# Patient Record
Sex: Female | Born: 1955 | Hispanic: Yes | Marital: Married | Smoking: Never smoker
Health system: Southern US, Community
[De-identification: ages and names within clinical notes are randomized; demographics above are authoritative.]

## PROBLEM LIST (undated history)

## (undated) DIAGNOSIS — Z9221 Personal history of antineoplastic chemotherapy: Secondary | ICD-10-CM

## (undated) DIAGNOSIS — Z9889 Other specified postprocedural states: Secondary | ICD-10-CM

## (undated) DIAGNOSIS — E039 Hypothyroidism, unspecified: Secondary | ICD-10-CM

## (undated) DIAGNOSIS — R7303 Prediabetes: Secondary | ICD-10-CM

## (undated) DIAGNOSIS — R112 Nausea with vomiting, unspecified: Secondary | ICD-10-CM

## (undated) DIAGNOSIS — Z1509 Genetic susceptibility to other malignant neoplasm: Secondary | ICD-10-CM

## (undated) DIAGNOSIS — N390 Urinary tract infection, site not specified: Secondary | ICD-10-CM

## (undated) DIAGNOSIS — Z923 Personal history of irradiation: Secondary | ICD-10-CM

## (undated) DIAGNOSIS — E079 Disorder of thyroid, unspecified: Secondary | ICD-10-CM

## (undated) DIAGNOSIS — C801 Malignant (primary) neoplasm, unspecified: Secondary | ICD-10-CM

## (undated) DIAGNOSIS — C50919 Malignant neoplasm of unspecified site of unspecified female breast: Secondary | ICD-10-CM

## (undated) DIAGNOSIS — Z1501 Genetic susceptibility to malignant neoplasm of breast: Secondary | ICD-10-CM

## (undated) DIAGNOSIS — I10 Essential (primary) hypertension: Secondary | ICD-10-CM

## (undated) DIAGNOSIS — C50411 Malignant neoplasm of upper-outer quadrant of right female breast: Secondary | ICD-10-CM

## (undated) DIAGNOSIS — F419 Anxiety disorder, unspecified: Secondary | ICD-10-CM

## (undated) HISTORY — DX: Urinary tract infection, site not specified: N39.0

## (undated) HISTORY — PX: ABDOMINAL HYSTERECTOMY: SHX81

## (undated) HISTORY — DX: Essential (primary) hypertension: I10

## (undated) HISTORY — PX: BREAST SURGERY: SHX581

## (undated) HISTORY — PX: SHOULDER ARTHROSCOPY: SHX128

## (undated) HISTORY — DX: Disorder of thyroid, unspecified: E07.9

## (undated) HISTORY — DX: Genetic susceptibility to malignant neoplasm of breast: Z15.09

## (undated) HISTORY — PX: EYE SURGERY: SHX253

## (undated) HISTORY — DX: Genetic susceptibility to malignant neoplasm of breast: Z15.01

## (undated) HISTORY — PX: CERVIX SURGERY: SHX593

---

## 1988-03-17 DIAGNOSIS — C539 Malignant neoplasm of cervix uteri, unspecified: Secondary | ICD-10-CM

## 1988-03-17 HISTORY — DX: Malignant neoplasm of cervix uteri, unspecified: C53.9

## 2003-12-26 ENCOUNTER — Ambulatory Visit: Payer: Self-pay

## 2004-06-26 ENCOUNTER — Ambulatory Visit: Payer: Self-pay

## 2005-12-24 ENCOUNTER — Ambulatory Visit: Payer: Self-pay

## 2006-03-17 HISTORY — PX: COLONOSCOPY: SHX174

## 2006-08-12 ENCOUNTER — Ambulatory Visit: Payer: Self-pay

## 2006-11-11 ENCOUNTER — Ambulatory Visit: Payer: Self-pay

## 2006-12-28 ENCOUNTER — Ambulatory Visit: Payer: Self-pay

## 2008-01-12 ENCOUNTER — Ambulatory Visit: Payer: Self-pay

## 2009-09-11 ENCOUNTER — Ambulatory Visit: Payer: Self-pay

## 2010-03-17 HISTORY — PX: SHOULDER ARTHROSCOPY: SHX128

## 2010-03-27 ENCOUNTER — Encounter
Admission: RE | Admit: 2010-03-27 | Discharge: 2010-03-27 | Payer: Self-pay | Source: Home / Self Care | Attending: Neurosurgery | Admitting: Neurosurgery

## 2010-06-07 ENCOUNTER — Ambulatory Visit: Payer: Self-pay | Admitting: Specialist

## 2010-06-24 ENCOUNTER — Other Ambulatory Visit: Payer: Self-pay | Admitting: Internal Medicine

## 2010-06-26 ENCOUNTER — Ambulatory Visit: Payer: Self-pay | Admitting: Internal Medicine

## 2010-07-03 ENCOUNTER — Ambulatory Visit: Payer: Self-pay | Admitting: Specialist

## 2010-10-22 ENCOUNTER — Ambulatory Visit: Payer: Self-pay

## 2011-01-22 ENCOUNTER — Emergency Department: Payer: Self-pay

## 2011-10-23 ENCOUNTER — Ambulatory Visit: Payer: Self-pay | Admitting: Family Medicine

## 2012-09-30 ENCOUNTER — Ambulatory Visit: Payer: Self-pay | Admitting: Family Medicine

## 2014-05-22 ENCOUNTER — Ambulatory Visit: Payer: Self-pay

## 2014-11-15 ENCOUNTER — Ambulatory Visit: Payer: Self-pay

## 2015-06-20 ENCOUNTER — Ambulatory Visit: Payer: Self-pay | Attending: Oncology | Admitting: *Deleted

## 2015-06-20 ENCOUNTER — Encounter: Payer: Self-pay | Admitting: *Deleted

## 2015-06-20 ENCOUNTER — Ambulatory Visit
Admission: RE | Admit: 2015-06-20 | Discharge: 2015-06-20 | Disposition: A | Discharge status: Home / Self Care | Payer: Self-pay | Source: Ambulatory Visit | Attending: Oncology | Admitting: Oncology

## 2015-06-20 VITALS — BP 147/77 | HR 73 | Temp 99.0°F | Resp 20 | Ht 60.24 in | Wt 161.5 lb

## 2015-06-20 DIAGNOSIS — Z0000 Encounter for general adult medical examination without abnormal findings: Secondary | ICD-10-CM

## 2015-06-20 HISTORY — DX: Malignant (primary) neoplasm, unspecified: C80.1

## 2015-06-20 NOTE — Progress Notes (Signed)
Subjective:     Patient ID: Wanda Reed, female   DOB: Apr 08, 1955, 60 y.o.   MRN: [id number]  HPI   Review of Systems     Objective:   Physical Exam  Pulmonary/Chest: Right breast exhibits no inverted nipple, no mass, no nipple discharge, no skin change and no tenderness. Left breast exhibits no inverted nipple, no mass, no nipple discharge, no skin change and no tenderness. Breasts are symmetrical.       Assessment:     60 year old Hispanic female returns to [facility] for annual screening.  Lloyda, the interpreter present during the interview and exam.  Clinical breast exam unremarkable.  Taught self breast awareness.  Patient has been screened for eligibility.  She does not have any insurance, Medicare or Medicaid.  She also meets financial eligibility.  Hand-out given on the Affordable Care Act.     Plan:     Screening mammogram ordered.  Will follow-up per [facility] protocol.

## 2015-06-20 NOTE — Progress Notes (Signed)
Letter mailed to inform patient of her normal mammogram results.  She is to return in one year for her annual screening.  [facility] to [facility].

## 2015-06-20 NOTE — Patient Instructions (Signed)
Gave patient hand-out, Women Staying Healthy, Active and Well from [facility], with education on breast health, pap smears, heart and colon health. 

## 2016-03-17 HISTORY — PX: PORTA CATH REMOVAL: CATH118286

## 2016-07-15 DIAGNOSIS — C50919 Malignant neoplasm of unspecified site of unspecified female breast: Secondary | ICD-10-CM

## 2016-07-15 HISTORY — DX: Malignant neoplasm of unspecified site of unspecified female breast: C50.919

## 2016-07-23 ENCOUNTER — Encounter (INDEPENDENT_AMBULATORY_CARE_PROVIDER_SITE_OTHER): Payer: Self-pay

## 2016-07-23 ENCOUNTER — Encounter: Payer: Self-pay | Admitting: *Deleted

## 2016-07-23 ENCOUNTER — Ambulatory Visit: Payer: Self-pay | Attending: Oncology | Admitting: *Deleted

## 2016-07-23 VITALS — BP 131/74 | HR 79 | Temp 98.8°F | Ht 60.0 in | Wt 159.0 lb

## 2016-07-23 DIAGNOSIS — N63 Unspecified lump in unspecified breast: Secondary | ICD-10-CM

## 2016-07-23 NOTE — Patient Instructions (Signed)
Gave patient hand-out, Women Staying Healthy, Active and Well from [facility], with education on breast health, pap smears, heart and colon health. 

## 2016-07-23 NOTE — Progress Notes (Signed)
Subjective:     Patient ID: Wanda Reed, female   DOB: 04-08-1955, 61 y.o.   MRN: [id number]  HPI   Review of Systems     Objective:   Physical Exam  Pulmonary/Chest: Right breast exhibits mass. Right breast exhibits no inverted nipple, no nipple discharge, no skin change and no tenderness. Left breast exhibits no inverted nipple, no mass, no nipple discharge, no skin change and no tenderness. Breasts are symmetrical.         Assessment:     61 year old Hispanic female presents to [facility] for annual screening.  Lloyda, the interpreter present during the interview and exam.  On clinical breast exam I can palpate an approximate 2 cm mobile mass at 12:00 right breast.  The mass is also faintly visible in supine position.  Patient states she has not noticed the mass prior to today.  Taught self breast awareness.  Patient has been screened for eligibility.  She does not have any insurance, Medicare or Medicaid.  She also meets financial eligibility.  Hand-out given on the Affordable Care Act.    Plan:     Ordered bilateral diagnostic mammogram and ultrasound.  If no findings on imaging I will refer for surgical consult.  Will follow-up per [facility] protocol

## 2016-07-31 ENCOUNTER — Ambulatory Visit
Admission: RE | Admit: 2016-07-31 | Discharge: 2016-07-31 | Disposition: A | Discharge status: Home / Self Care | Payer: Self-pay | Source: Ambulatory Visit | Attending: Oncology | Admitting: Oncology

## 2016-07-31 ENCOUNTER — Other Ambulatory Visit: Payer: Self-pay | Admitting: Family Medicine

## 2016-07-31 DIAGNOSIS — N63 Unspecified lump in unspecified breast: Secondary | ICD-10-CM

## 2016-08-01 ENCOUNTER — Other Ambulatory Visit: Payer: Self-pay | Admitting: *Deleted

## 2016-08-01 DIAGNOSIS — N63 Unspecified lump in unspecified breast: Secondary | ICD-10-CM

## 2016-08-08 ENCOUNTER — Ambulatory Visit
Admission: RE | Admit: 2016-08-08 | Discharge: 2016-08-08 | Disposition: A | Discharge status: Home / Self Care | Payer: Self-pay | Source: Ambulatory Visit | Attending: Oncology | Admitting: Oncology

## 2016-08-08 DIAGNOSIS — N63 Unspecified lump in unspecified breast: Secondary | ICD-10-CM

## 2016-08-08 HISTORY — PX: BREAST BIOPSY: SHX20

## 2016-08-12 ENCOUNTER — Telehealth: Payer: Self-pay | Admitting: *Deleted

## 2016-08-12 ENCOUNTER — Other Ambulatory Visit: Payer: Self-pay | Admitting: Pathology

## 2016-08-12 NOTE — Telephone Encounter (Signed)
Patient with positive path results.  Called patient via Herb Grays, the interpreter and left message for her to return my call.

## 2016-08-13 ENCOUNTER — Telehealth: Payer: Self-pay | Admitting: *Deleted

## 2016-08-13 NOTE — Telephone Encounter (Addendum)
Tried to call patient again today via the interpreter Wanda Reed.  She left a message for the patient to return my call.  I tried the emergency contact number and was told it was not listed to the person I was trying to contact.  Tried the second contact, her daughter Wanda Reed and she is going to call her mom and have her return my call.    Patient returned my call.  She would like to come in and get her results.  She is to come in tomorrow at 9:00.  I have scheduled an interpreter to assist with translation.  I have also scheduled her to see Dr. Bary Castilla for surgical consultation.  I will also schedule a medical oncology consult.

## 2016-08-14 ENCOUNTER — Encounter: Payer: Self-pay | Admitting: *Deleted

## 2016-08-14 NOTE — Progress Notes (Signed)
Patient came in today to get the results of her right breast biopsy.  Wanda Reed, the interpreter was present as assisted with translation.  Reviewed with the patient that the results were positive for breast cancer.  Briefly explained invasive mammary carcinoma.  Patient very upset and tearful.  Offered support.  Completed [facility] medicaid application since the patient is a Permanent [place] Resident.  Gave patient breast cancer educational literature, "My Breast Cancer Treatment Handbook" by Wanda Igo, RN.  Patient is scheduled to see Dr. Bary Castilla on 08/18/16 @ 3:45.  She is to take all her meds and a photo ID.  She is also scheduled to see Dr. Grayland Ormond on 08/19/16 @ 10:45.  Explained that I will be with her at her medical oncology appointment.  Requested that she bring someone with her to both appointments.  She is to call if she has any questions or needs.

## 2016-08-15 DIAGNOSIS — C50411 Malignant neoplasm of upper-outer quadrant of right female breast: Secondary | ICD-10-CM

## 2016-08-15 HISTORY — DX: Malignant neoplasm of upper-outer quadrant of right female breast: C50.411

## 2016-08-18 ENCOUNTER — Ambulatory Visit (INDEPENDENT_AMBULATORY_CARE_PROVIDER_SITE_OTHER): Payer: Medicaid Other | Admitting: General Surgery

## 2016-08-18 ENCOUNTER — Inpatient Hospital Stay: Payer: Self-pay

## 2016-08-18 ENCOUNTER — Encounter: Payer: Self-pay | Admitting: General Surgery

## 2016-08-18 ENCOUNTER — Other Ambulatory Visit: Payer: Self-pay | Admitting: *Deleted

## 2016-08-18 VITALS — BP 130/70 | Resp 12 | Ht 62.0 in | Wt 156.0 lb

## 2016-08-18 DIAGNOSIS — C50411 Malignant neoplasm of upper-outer quadrant of right female breast: Secondary | ICD-10-CM | POA: Insufficient documentation

## 2016-08-18 DIAGNOSIS — Z170 Estrogen receptor positive status [ER+]: Secondary | ICD-10-CM | POA: Insufficient documentation

## 2016-08-18 DIAGNOSIS — N6310 Unspecified lump in the right breast, unspecified quadrant: Secondary | ICD-10-CM

## 2016-08-18 NOTE — Progress Notes (Signed)
Patient ID: Wanda Reed, female   DOB: 05/02/1955, 61 y.o.   MRN: [id number]  Chief Complaint  Patient presents with  . Other    HPI Wanda Reed is a 61 y.o. female who presents for a breast evaluation. The most recent mammogram was done on 07/31/2016 and right breast biopsy 08/08/2016. [name]  Patient does perform regular self breast checks and gets regular mammograms done.  Daughter, Wanda Reed is present at visit.   The patient is a mother 5 daughters. No family history of breast cancer. She does return complete regular breast self examinations.  She is presently a homemaker.   She is a native of [place]. Her daughter reports that her mother is unable to read.  Wanda Reed, interpreter at visit.   HPI  Past Medical History:  Diagnosis Date  . Cancer (HCC)    cervical  . Hypertension   . Thyroid disease     Past Surgical History:  Procedure Laterality Date  . CERVIX SURGERY    . COLONOSCOPY  2008    Family History  Problem Relation Age of Onset  . Breast cancer Neg Hx     Social History Social History  Substance Use Topics  . Smoking status: Not on file  . Smokeless tobacco: Never Used  . Alcohol use No    No Known Allergies  Current Outpatient Prescriptions  Medication Sig Dispense Refill  . acyclovir (ZOVIRAX) 400 MG tablet Take 400 mg by mouth as needed.    . hydrochlorothiazide (HYDRODIURIL) 25 MG tablet Take 25 mg by mouth daily.    [name] levothyroxine (SYNTHROID, LEVOTHROID) 100 MCG tablet Take 100 mcg by mouth daily before breakfast.    . lisinopril (PRINIVIL,ZESTRIL) 10 MG tablet Take 10 mg by mouth daily.     No current facility-administered medications for this visit.     Review of Systems Review of Systems  Constitutional: Negative.   Respiratory: Negative.   Cardiovascular: Negative.     Blood pressure 130/70, resp. rate 12, height '5\' 2"'  (1.575 m), weight 156 lb (70.8 kg).  Physical Exam Physical Exam  Constitutional: She is  oriented to person, place, and time. She appears well-developed and well-nourished.  Eyes: Conjunctivae are normal. No scleral icterus.  Neck: Neck supple.  Cardiovascular: Normal rate, regular rhythm and normal heart sounds.   Pulmonary/Chest: Effort normal and breath sounds normal. Right breast exhibits no inverted nipple, no nipple discharge, no skin change and no tenderness. Left breast exhibits no inverted nipple, no mass, no nipple discharge, no skin change and no tenderness.    Abdominal: Soft. Bowel sounds are normal. There is no tenderness.  Lymphadenopathy:    She has no cervical adenopathy.    She has no axillary adenopathy.       Right: No supraclavicular adenopathy present.       Left: No supraclavicular adenopathy present.  Neurological: She is alert and oriented to person, place, and time.  Skin: Skin is warm and dry.    Data Reviewed Mammograms of 06/20/2015 as well as 07/31/2016 with associated ultrasound were reviewed. New mass noted in the upper-outer quadrant of the right breast.   Biopsy results of 08/14/2016:   A. 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   Estrogen Receptor (ER) Status: POSITIVE, >90% nuclear staining  Progesterone Receptor (PgR) Status: NEGATIVE, <1% nuclear staining  HER2 (by immunohistochemistry): EQUIVOCAL, 2+  Percentage of  cells with uniform intense complete membrane staining: 1%   Ultrasound examination of the breast was undertaken determine of preoperative needle localization would be required.  At the 11:30 o'clock position, 3 cm from the nipple, just outside the edge of the areola a well-defined hypoechoic mass measuring 0.94 x 0.99 x 1.2 cm is identified. A biopsy clip is located within this lesion. 1 cm below the skin.  Examination of the axilla shows a single mildly enlarged lymph node measuring up to 1.38 cm in diameter. No  from 2017 mammogram. BI-RADS-6.  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    Plan    The patient had been under the impression that she had stage III disease. This mass was put to rest. Options for breast conservation as well as mastectomy were discussed. Indications for post operative radiation therapy were reviewed. Need for sentinel node biopsy discussed.  The majority of the visit was spent reviewing the options for breast cancer treatment. Breast conservation with lumpectomy and radiation therapy  was presented as equivalent to mastectomy for long-term control. The pros and cons of each treatment regimen were reviewed. The indications for additional therapy such as chemotherapy were touched on briefly, realizing that the majority of information required to determine if chemotherapy would be of benefit is not available at this time. The availability of second surgical opinion were reviewed. The patient is scheduled to meet with medical oncology tomorrow.   We'll defer laboratory study testing to her upcoming medical oncology appointment.  Contact number was provided for the patient or her daughter (who speaks fluent [place]) sees to notify the office of how she would like to proceed.     HPI, Physical Exam, Assessment and Plan have been scribed under the direction and in the presence of Hervey Ard, MD.  Gaspar Cola, CMA  I have completed the exam and reviewed the above documentation for accuracy and completeness.  I agree with the above.  Haematologist has been used and any errors in dictation or transcription are unintentional.  Hervey Ard, M.D., F.A.C.S.  [name] 08/18/2016, 8:53 PM

## 2016-08-18 NOTE — Progress Notes (Signed)
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  INTERVAL HISTORY: Patient is a 61 year old female who was noted to have an abnormality on routine screening mammogram. Subsequent ultrasound and biopsy revealed the above stated breast cancer. Currently, she feels well and is asymptomatic. She has no neurologic complaints. She denies any recent fevers or illnesses. She has a good appetite and denies weight loss. She has no chest pain or shortness of breath. She denies any nausea, vomiting, constipation, or diarrhea. She has no urinary complaints. Patient feels at her baseline and offers no specific complaints today.  REVIEW OF SYSTEMS:   Review of Systems  Constitutional: Negative.  Negative for fever, malaise/fatigue and weight loss.  Respiratory: Negative.  Negative for cough and shortness of breath.   Cardiovascular: Negative.  Negative for chest pain and leg swelling.  Gastrointestinal: Negative.  Negative for abdominal pain.  Genitourinary: Negative.   Musculoskeletal: Negative.   Skin: Negative.  Negative for rash.  Neurological: Negative.  Negative for weakness.  Psychiatric/Behavioral: Negative.  The patient is not nervous/anxious.     As per HPI. Otherwise, a complete review of systems is negative.  PAST MEDICAL HISTORY: Past Medical History:  Diagnosis Date  . Anxiety   . Cancer (HCC)    cervical  . Hypertension   . Thyroid disease     PAST SURGICAL HISTORY: Past Surgical History:  Procedure Laterality Date  . ABDOMINAL HYSTERECTOMY      . BREAST SURGERY Right    Breast Biopsy  . CERVIX SURGERY    . COLONOSCOPY  2008  . EYE SURGERY Bilateral    Cataract Extraction with IOL  . SHOULDER ARTHROSCOPY Right     FAMILY HISTORY: Family History  Problem Relation Age of Onset  . Breast cancer Neg Hx     ADVANCED DIRECTIVES (Y/N):  N  HEALTH MAINTENANCE: Social History  Substance Use Topics  . Smoking status: Never Smoker  . Smokeless tobacco: Never Used  . Alcohol use No     Colonoscopy:  PAP:  Bone density:  Lipid panel:  No Known Allergies  Current Outpatient Prescriptions  Medication Sig Dispense Refill  . acyclovir (ZOVIRAX) 400 MG tablet Take 400 mg by mouth 3 (three) times daily as needed (cold sore outbreaks).     . hydrochlorothiazide (HYDRODIURIL) 25 MG tablet Take 25 mg by mouth daily.    [name] levothyroxine (SYNTHROID, LEVOTHROID) 100 MCG tablet Take 100 mcg by mouth daily before breakfast.    . lisinopril (PRINIVIL,ZESTRIL) 10 MG tablet Take 10 mg by mouth daily.    . Potassium Chloride ER 20 MEQ TBCR Take 20 mEq by mouth 2 (two) times daily. 60 tablet 1   No current facility-administered medications for this visit.     OBJECTIVE: Vitals:   08/19/16 1107  BP: 123/70  Pulse: 83  Resp: 20  Temp: 97.4 F (36.3 C)     Body mass index is 28.75 kg/m.    ECOG FS:0 - Asymptomatic  General: Well-developed, well-nourished, no acute distress. Eyes: Pink conjunctiva, anicteric sclera. HEENT: Normocephalic, moist mucous membranes, clear oropharnyx. Breasts: Patient requested exam  be deferred today. Lungs: Clear to auscultation bilaterally. Heart: Regular rate and rhythm. No rubs, murmurs, or gallops. Abdomen: Soft, nontender, nondistended. No organomegaly noted, normoactive bowel sounds. Musculoskeletal: No edema, cyanosis, or clubbing. Neuro: Alert, answering all questions appropriately. Cranial nerves grossly intact. Skin: No rashes or petechiae noted. Psych: Normal affect. Lymphatics: No  cervical, calvicular, axillary or inguinal LAD.   LAB RESULTS:  Lab Results  Component Value Date   K 3.1 (L) 08/21/2016    No results found for: WBC, NEUTROABS, HGB, HCT, MCV, PLT   STUDIES: US Breast Complete Uni Right Inc Axilla  Result Date: 08/18/2016 Ultrasound examination of the breast was undertaken determine of preoperative needle localization would be required. At the 11:30 o'clock position, 3 cm from the nipple, just outside the edge of the areola a well-defined hypoechoic mass measuring 0.94 x 0.99 x 1.2 cm is identified. A biopsy clip is located within this lesion. 1 cm below the skin. Examination of the axilla shows a single mildly enlarged lymph node measuring up to 1.38 cm in diameter. No from 2017 mammogram. BI-RADS-6.   US Breast Ltd Uni Right Inc Axilla  Result Date: 07/31/2016 CLINICAL DATA:  61 year old female presenting for evaluation of a palpable lump in the superior right breast. EXAM: 2D DIGITAL DIAGNOSTIC BILATERAL MAMMOGRAM WITH CAD AND ADJUNCT TOMO RIGHT BREAST ULTRASOUND COMPARISON:  Previous exam(s). ACR Breast Density Category b: There are scattered areas of fibroglandular density. FINDINGS: A BB has been placed on the superior aspect of the periareolar right breast. Deep to the palpable marker there is a 1 point indistinct and possibly angular margins. No other suspicious calcifications, masses or areas of distortion are seen in the bilateral breasts. Mammographic images were processed with CAD. There is a discrete firm very tender mass the can be palpated just superior to the right nipple corresponding with the palpable site of concern. Ultrasound targeted to the right breast at 11:30, 1 cm from the nipple demonstrates a hypoechoic oval mass with indistinct margins measuring 1.2 x 0.9 x 1.0 cm. Ultrasound of the right axilla demonstrates multiple normal-appearing lymph nodes. IMPRESSION: 1. There is a suspicious right breast mass at 11:30 corresponding with the  palpable site of concern. 2.  No evidence of right axillary lymphadenopathy. 3.  No mammographic evidence of malignancy in the left breast. RECOMMENDATION: Ultrasound-guided biopsy is recommended for the right breast mass at 11:30. I have discussed the findings and recommendations with the patient. Results were also provided in writing at the conclusion of the visit. If applicable, a reminder letter will be sent to the patient regarding the next appointment. BI-RADS CATEGORY  4: Suspicious. Electronically Signed   By: Ammie Ferrier M.D.   On: 07/31/2016 10:36   Ms Digital Diag Tomo Bilat  Result Date: 07/31/2016 CLINICAL DATA:  61 year old female presenting for evaluation of a palpable lump in the superior right breast. EXAM: 2D DIGITAL DIAGNOSTIC BILATERAL MAMMOGRAM WITH CAD AND ADJUNCT TOMO RIGHT BREAST ULTRASOUND COMPARISON:  Previous exam(s). ACR Breast Density Category b: There are scattered areas of fibroglandular density. FINDINGS: A BB has been placed on the superior aspect of the periareolar right breast. Deep to the palpable marker there is a 1 point indistinct and possibly angular margins. No other suspicious calcifications, masses or areas of distortion are seen in the bilateral breasts. Mammographic images were processed with CAD. There is a discrete firm very tender mass the can be palpated just superior to the right nipple corresponding with the palpable site of  concern. Ultrasound targeted to the right breast at 11:30, 1 cm from the nipple demonstrates a hypoechoic oval mass with indistinct margins measuring 1.2 x 0.9 x 1.0 cm. Ultrasound of the right axilla demonstrates multiple normal-appearing lymph nodes. IMPRESSION: 1. There is a suspicious right breast mass at 11:30 corresponding with the palpable site of concern. 2.  No evidence of right axillary lymphadenopathy. 3.  No mammographic evidence of malignancy in the left breast. RECOMMENDATION: Ultrasound-guided biopsy is recommended for  the right breast mass at 11:30. I have discussed the findings and recommendations with the patient. Results were also provided in writing at the conclusion of the visit. If applicable, a reminder letter will be sent to the patient regarding the next appointment. BI-RADS CATEGORY  4: Suspicious. Electronically Signed   By: Ammie Ferrier M.D.   On: 07/31/2016 10:36   Ms Digital Diag Uni Right  Result Date: 08/08/2016 CLINICAL DATA:  Status post ultrasound-guided core needle biopsy of a 1.2 cm mass in the 11:30 o'clock position of the right breast, 1 cm from the nipple. EXAM: DIAGNOSTIC RIGHT MAMMOGRAM POST ULTRASOUND BIOPSY COMPARISON:  Previous exam(s). FINDINGS: Mammographic images were obtained following ultrasound guided biopsy of the recently demonstrated 1.2 cm mass in the 11:30 o'clock position of the right breast, 1 cm from the nipple. These demonstrate a ribbon shaped biopsy marker clip within the biopsied mass. IMPRESSION: Appropriate clip deployment following right breast ultrasound-guided core needle biopsy. Final Assessment: Post Procedure Mammograms for Marker Placement Electronically Signed   By: Claudie Revering M.D.   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 Size of invasive carcinoma: 0.3 cm in this sample. Histologic grade of invasive carcinoma: Grade 2. ER/PR/HER2: Immunohistochemistry will be performed on block A1, with reflex to [place] for HER2 2+. The results will be reported in an addendum. Note: The diagnosis was called to Aldona Bar of [facility] on 08/12/16 at 11:35 AM. Read back procedure was performed. This was found to be concordant by Dr. Joneen Caraway. Recommendation:  Surgical  and oncology referrals. Results and recommendations were relayed to Tanya Nones, RN, nurse navigator and B[facility] coordinator for [facility] by [place], [place]. Tanya Nones, RN contacted the patient by phone with [place], hospital interpreter. The patient requested to come in for results. Results were given to the patient on 08/14/16 by Tanya Nones, RN and Herb Grays, interpreter. A surgical appointment has been made for the patient with Dr. Bary Castilla for 08/18/16 at 4:15 PM and Dr. Grayland Ormond, oncologist for 08/19/16 at 10:45 AM. She was encouraged to contact Tanya Nones, RN with any further questions or concerns. Addendum by Jetta Lout, RRA on 08/14/16. Electronically Signed   By: Claudie Revering M.D.   On: 08/14/2016 12:14   Result Date: 08/14/2016 CLINICAL DATA:  1.2 cm mass in the 11:30 o'clock of the right breast, 1 cm from the nipple, at recent mammography and ultrasound. EXAM: ULTRASOUND GUIDED RIGHT BREAST CORE NEEDLE BIOPSY COMPARISON:  Previous exam(s). FINDINGS: I met with the patient and we discussed the procedure of ultrasound-guided biopsy, including benefits and alternatives. We discussed the high likelihood of a successful procedure. We discussed the risks of the procedure, including infection, bleeding,  tissue injury, clip migration, and inadequate sampling. Informed written consent was given. The usual time-out protocol was performed immediately prior to the procedure. Lesion quadrant: Lower outer quadrant Using sterile technique and 1% Lidocaine as local anesthetic, under direct ultrasound visualization, a 12 gauge spring-loaded device was used to perform biopsy of the recently demonstrated 1.2 cm mass in the 11:30 o'clock position of the right breast, 1 cm from the nipple, using an inferolateral approach. At the conclusion of the procedure a ribbon shaped tissue marker clip was deployed into the biopsy cavity. Follow up 2 view mammogram was performed and dictated separately.  IMPRESSION: Ultrasound guided biopsy of a 1.2 cm mass in the 11:30 o'clock position of the right breast. No apparent complications. Electronically Signed: By: Claudie Revering M.D. 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  PLAN:    1. 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 If her HER-2 returns negative, will order Oncotype DX to determine whether chemotherapy is necessary. If patient does not require chemotherapy, she will definitely benefit from adjuvant XRT and a referral has been made to radiation oncology. At the conclusion of all her treatments, patient will benefit from an aromatase inhibitor given the ER status of her tumor. Return to clinic one to 2 weeks after her surgery for further evaluation and additional treatment planning.  Approximately 60 minutes was spent in discussion of which greater than 50% was consultation.  Patient expressed understanding and was in agreement with this plan. She also understands that She can call clinic at any time with any questions, concerns, or complaints.   Cancer Staging Malignant neoplasm of upper-outer quadrant of right breast in female, estrogen receptor positive ([place]) Staging form: Breast, AJCC 8th Edition - Clinical stage from 08/18/2016: Stage IA (cT1c, cN0, cM0, G2, ER: Positive, PR: Negative, HER2: Equivocal) - Signed by Lloyd Huger, MD on 08/18/2016   Lloyd Huger, MD   08/24/2016 7:12 PM

## 2016-08-19 ENCOUNTER — Telehealth: Payer: Self-pay

## 2016-08-19 ENCOUNTER — Inpatient Hospital Stay: Payer: Self-pay | Attending: Oncology | Admitting: Oncology

## 2016-08-19 ENCOUNTER — Other Ambulatory Visit: Payer: Self-pay | Admitting: General Surgery

## 2016-08-19 DIAGNOSIS — F419 Anxiety disorder, unspecified: Secondary | ICD-10-CM

## 2016-08-19 DIAGNOSIS — Z8541 Personal history of malignant neoplasm of cervix uteri: Secondary | ICD-10-CM

## 2016-08-19 DIAGNOSIS — Z170 Estrogen receptor positive status [ER+]: Secondary | ICD-10-CM

## 2016-08-19 DIAGNOSIS — C50411 Malignant neoplasm of upper-outer quadrant of right female breast: Secondary | ICD-10-CM

## 2016-08-19 DIAGNOSIS — Z79899 Other long term (current) drug therapy: Secondary | ICD-10-CM

## 2016-08-19 DIAGNOSIS — I10 Essential (primary) hypertension: Secondary | ICD-10-CM

## 2016-08-19 DIAGNOSIS — E079 Disorder of thyroid, unspecified: Secondary | ICD-10-CM

## 2016-08-19 NOTE — Progress Notes (Signed)
  Oncology Nurse Navigator Documentation  Navigator Location: [facility]-Med Onc (08/19/16 1300)   )Navigator Encounter Type: Initial MedOnc (08/19/16 1300)                         Barriers/Navigation Needs: Coordination of Care;Education (08/19/16 1300) Education: Newly Diagnosed Cancer Education;Understanding Cancer/ Treatment Options (08/19/16 1300) Interventions: Coordination of Care;Education (08/19/16 1300)   Coordination of Care: Appts (08/19/16 1300)                  Time Spent with Patient: 60 (08/19/16 1300)  Met patient, and daughter Lenna Sciara at initial Med Onc Visit.  Patient plans for lumpectomy.  Notified Caryl-Lyn at  Dr. Dwyane Luo office for surgery to be scheduled. She will call patient with surgery date/time.  Her2  Results equivocal, pending FISH.  If negative, Oncotype to be sent per Dr. Grayland Ormond.  Maritza Afanador interpreted exam.

## 2016-08-19 NOTE — Telephone Encounter (Signed)
Spoke with patient's daughter, Wanda Reed about setting up her mother's surgery. She is scheduled for surgery at [facility] on 08/28/16. She will pre admit at the hospital. She will be contact with her arrival time for surgery once posted. The patient is aware of date and instructions.

## 2016-08-19 NOTE — Progress Notes (Signed)
Patient here today for initial evaluation regarding breast cancer. Patient seen by Dr. Bary Castilla yesterday.

## 2016-08-20 ENCOUNTER — Telehealth: Payer: Self-pay

## 2016-08-20 ENCOUNTER — Other Ambulatory Visit: Payer: Self-pay | Admitting: Cardiovascular Disease

## 2016-08-20 NOTE — Telephone Encounter (Signed)
The patient is scheduled for pre admit testing at [facility] on 08/21/16 at 2:45 pm. She will arrive for her surgery on 08/28/16 at the radiology desk by 8:00 am. The patient is aware of dates, times, and instructions.

## 2016-08-21 ENCOUNTER — Encounter
Admission: RE | Admit: 2016-08-21 | Discharge: 2016-08-21 | Disposition: A | Discharge status: Home / Self Care | Payer: Medicaid Other | Source: Ambulatory Visit | Attending: General Surgery | Admitting: General Surgery

## 2016-08-21 DIAGNOSIS — I10 Essential (primary) hypertension: Secondary | ICD-10-CM | POA: Insufficient documentation

## 2016-08-21 DIAGNOSIS — Z01810 Encounter for preprocedural cardiovascular examination: Secondary | ICD-10-CM | POA: Diagnosis present

## 2016-08-21 DIAGNOSIS — Z01812 Encounter for preprocedural laboratory examination: Secondary | ICD-10-CM | POA: Insufficient documentation

## 2016-08-21 HISTORY — DX: Anxiety disorder, unspecified: F41.9

## 2016-08-21 LAB — POTASSIUM: Potassium: 3.1 mmol/L — ABNORMAL LOW (ref 3.5–5.1)

## 2016-08-21 NOTE — Patient Instructions (Signed)
Your procedure is scheduled on: August 28, 2016 (THURSDAY)  Su procedimiento est programado para: Report to Radiology. (SECOND DESK ON RIGHT IN MEDICAL MALL ) Arrival time 8: 15 AM   Remember: Instructions that are not followed completely may result in serious medical risk, up to and including death, or upon the discretion of your surgeon and anesthesiologist your surgery may need to be rescheduled.  Recuerde: Las instrucciones que no se siguen completamente Heritage manager en un riesgo de salud grave, incluyendo hasta la [place] o a discrecin de su cirujano y Environmental health practitioner, su ciruga se puede posponer.   __x__ 1. Do not eat food or drink liquids after midnight. No gum chewing or hard candies.  No coma alimentos ni tome lquidos despus de la medianoche.  No mastique chicle ni caramelos  duros.     __x__ 2. No alcohol for 24 hours before or after surgery.    No tome alcohol durante las 24 horas antes ni despus de la [place].   ____ 3. Bring all medications with you on the day of surgery if instructed.    Lleve todos los medicamentos con usted el da de su ciruga si se le ha indicado as.   __x__ 4. Notify your doctor if there is any change in your medical condition (cold, fever,                             infections).    Informe a su mdico si hay algn cambio en su condicin mdica (resfriado, fiebre, infecciones).   Do not wear jewelry, make-up, hairpins, clips or nail polish.  No use joyas, maquillajes, pinzas/ganchos para el cabello ni esmalte de uas.  Do not wear lotions, powders, or perfumes. You may wear deodorant.  No use lociones, polvos o perfumes.  Puede usar desodorante.    Do not shave 48 hours prior to surgery. Men may shave face and neck.  No se afeite 48 horas antes de la [place].  Los hombres pueden [employer] cara y el cuello.   Do not bring valuables to the hospital.   No lleve objetos [place] is not responsible for any belongings or  valuables.  [place] no se hace responsable de ningn tipo de pertenencias u objetos de Geographical information systems officer.               Contacts, dentures or bridgework may not be worn into surgery.  Los lentes de [place], las dentaduras postizas o puentes no se pueden usar en la [place].  Leave your suitcase in the car. After surgery it may be brought to your room.  Deje su maleta en el auto.  Despus de la ciruga podr traerla a su habitacin.  For patients admitted to the hospital, discharge time is determined by your treatment team.  Para los pacientes que sean ingresados al hospital, el tiempo en el cual se le dar de alta es determinado por su                equipo de [place].   Patients discharged the day of surgery will not be allowed to drive home. A los pacientes que se les da de alta el mismo da de la ciruga no se les permitir conducir a Holiday representative.   Please read over the following fact sheets that you were given: Por favor [place] informacin que le dieron:      __x__ Take  these medicines the morning of surgery with A SIP OF WATER:          [employer] estas medicinas la maana de la ciruga con UN SORBO DE AGUA:  1. LISINOPRIL  2. LEVOTHYROXINE  3.   4.       5.  6.  ____ Fleet Enema (as directed)          Enema de Fleet (segn lo indicado)    _x___ Use CHG Soap as directed          Utilice el jabn de CHG segn lo indicado  ____ Use inhalers on the day of surgery          Use los inhaladores el da de la ciruga  ____ Stop metformin 2 days prior to surgery          Deje de tomar el metformin 2 das antes de la ciruga    ____ Take 1/2 of usual insulin dose the night before surgery and none on the morning of surgery           Tome la mitad de la dosis habitual de insulina la noche antes de la [place] y no tome nada en la maana de la             ciruga  _x___ Stop Coumadin/Plavix/aspirin on (NO ASPIRIN)          Deje de tomar el Coumadin/Plavix/aspirina el  da:  _X___ Stop Anti-inflammatories on (NO ADVIL,  ALEVE, IBUPROFEN,GOODYS )TYLENOL OK TO TAKE FOR PAIN IF NEEDED           Deje de tomar antiinflamatorios el da:   __X__ Stop supplements until after surgery            Deje de tomar suplementos hasta despus de la ciruga  ____ Bring C-Pap to the hospital          [place] [facility]

## 2016-08-22 ENCOUNTER — Telehealth: Payer: Self-pay

## 2016-08-22 ENCOUNTER — Other Ambulatory Visit: Payer: Self-pay | Admitting: General Surgery

## 2016-08-22 LAB — CANCER ANTIGEN 27.29: CA 27.29: 16.8 U/mL (ref 0.0–38.6)

## 2016-08-22 MED ORDER — POTASSIUM CHLORIDE ER 20 MEQ PO TBCR: 20.0000 meq | EXTENDED_RELEASE_TABLET | Freq: Two times a day (BID) | ORAL | 1 refills | Status: DC

## 2016-08-22 NOTE — Progress Notes (Signed)
K+ 3.1 on preop labs. Will supplement w/ KCL 20 mEq BID.

## 2016-08-22 NOTE — Telephone Encounter (Signed)
Spoke to Dr. Bary Castilla at 1:00 pm.  Patient's potassium was low on recent labs and Dr. Bary Castilla sent in a prescription to [facility] for potassium supplements.  I called patient's daughter to notify her that her mother needed this medication.  [facility] was closed for the weekend.  I contacted Dr. Bary Castilla to be sure it was all right for patient to wait until Monday to start medicine.  He said that was fine.  I notified Lenna Sciara, the patient's daughter, to pick up the medicine Monday.  If they had any questions once they picked up the meds to call our office.  I left a message for Interpreter services to assist [place] with calling the patient herself, but they did not get back to me before our office closed for the day.  I will try again on Monday.

## 2016-08-25 ENCOUNTER — Encounter: Payer: Self-pay | Admitting: Oncology

## 2016-08-25 LAB — SURGICAL PATHOLOGY

## 2016-08-25 NOTE — Pre-Procedure Instructions (Signed)
Dr. Dwyane Luo office aware of low K and will check with patient to see if she picked up her K and started taking it.

## 2016-08-28 ENCOUNTER — Encounter: Payer: Self-pay | Admitting: *Deleted

## 2016-08-28 ENCOUNTER — Ambulatory Visit: Payer: Medicaid Other | Admitting: Anesthesiology

## 2016-08-28 ENCOUNTER — Observation Stay
Admission: RE | Admit: 2016-08-28 | Discharge: 2016-08-29 | Disposition: A | Discharge status: Home / Self Care | Payer: Medicaid Other | Source: Ambulatory Visit | Attending: General Surgery | Admitting: General Surgery

## 2016-08-28 ENCOUNTER — Ambulatory Visit: Payer: Medicaid Other

## 2016-08-28 ENCOUNTER — Observation Stay: Payer: Medicaid Other

## 2016-08-28 ENCOUNTER — Ambulatory Visit
Admission: RE | Admit: 2016-08-28 | Discharge: 2016-08-28 | Disposition: A | Discharge status: Home / Self Care | Payer: Medicaid Other | Source: Ambulatory Visit | Attending: General Surgery | Admitting: General Surgery

## 2016-08-28 ENCOUNTER — Encounter
Admission: RE | Disposition: A | Discharge status: Home / Self Care | Payer: Self-pay | Source: Ambulatory Visit | Attending: General Surgery

## 2016-08-28 ENCOUNTER — Observation Stay
Admission: RE | Admit: 2016-08-28 | Discharge: 2016-08-28 | Disposition: A | Discharge status: Home / Self Care | Payer: Medicaid Other | Source: Ambulatory Visit | Attending: Internal Medicine | Admitting: Internal Medicine

## 2016-08-28 DIAGNOSIS — I959 Hypotension, unspecified: Secondary | ICD-10-CM | POA: Insufficient documentation

## 2016-08-28 DIAGNOSIS — C50411 Malignant neoplasm of upper-outer quadrant of right female breast: Secondary | ICD-10-CM | POA: Diagnosis not present

## 2016-08-28 DIAGNOSIS — R0902 Hypoxemia: Secondary | ICD-10-CM | POA: Diagnosis not present

## 2016-08-28 DIAGNOSIS — R000 Tachycardia, unspecified: Secondary | ICD-10-CM

## 2016-08-28 DIAGNOSIS — Z8541 Personal history of malignant neoplasm of cervix uteri: Secondary | ICD-10-CM | POA: Insufficient documentation

## 2016-08-28 DIAGNOSIS — I499 Cardiac arrhythmia, unspecified: Secondary | ICD-10-CM

## 2016-08-28 DIAGNOSIS — E039 Hypothyroidism, unspecified: Secondary | ICD-10-CM | POA: Diagnosis not present

## 2016-08-28 DIAGNOSIS — E875 Hyperkalemia: Secondary | ICD-10-CM | POA: Diagnosis not present

## 2016-08-28 DIAGNOSIS — F419 Anxiety disorder, unspecified: Secondary | ICD-10-CM | POA: Diagnosis not present

## 2016-08-28 DIAGNOSIS — N6310 Unspecified lump in the right breast, unspecified quadrant: Secondary | ICD-10-CM

## 2016-08-28 DIAGNOSIS — I10 Essential (primary) hypertension: Secondary | ICD-10-CM | POA: Insufficient documentation

## 2016-08-28 DIAGNOSIS — R001 Bradycardia, unspecified: Secondary | ICD-10-CM | POA: Insufficient documentation

## 2016-08-28 DIAGNOSIS — I471 Supraventricular tachycardia: Secondary | ICD-10-CM | POA: Diagnosis not present

## 2016-08-28 DIAGNOSIS — R0789 Other chest pain: Secondary | ICD-10-CM | POA: Insufficient documentation

## 2016-08-28 DIAGNOSIS — Z170 Estrogen receptor positive status [ER+]: Secondary | ICD-10-CM | POA: Diagnosis not present

## 2016-08-28 DIAGNOSIS — Z79899 Other long term (current) drug therapy: Secondary | ICD-10-CM | POA: Insufficient documentation

## 2016-08-28 DIAGNOSIS — E871 Hypo-osmolality and hyponatremia: Secondary | ICD-10-CM | POA: Diagnosis not present

## 2016-08-28 DIAGNOSIS — R079 Chest pain, unspecified: Secondary | ICD-10-CM

## 2016-08-28 DIAGNOSIS — I472 Ventricular tachycardia: Secondary | ICD-10-CM

## 2016-08-28 HISTORY — PX: BREAST LUMPECTOMY WITH SENTINEL LYMPH NODE BIOPSY: SHX5597

## 2016-08-28 HISTORY — PX: BREAST LUMPECTOMY: SHX2

## 2016-08-28 HISTORY — DX: Malignant neoplasm of upper-outer quadrant of right female breast: C50.411

## 2016-08-28 LAB — GLUCOSE, CAPILLARY
Glucose-Capillary: 110 mg/dL — ABNORMAL HIGH (ref 65–99)
Glucose-Capillary: 89 mg/dL (ref 65–99)

## 2016-08-28 LAB — POCT I-STAT 4, (NA,K, GLUC, HGB,HCT)
Glucose, Bld: 92 mg/dL (ref 65–99)
HCT: 39 % (ref 36.0–46.0)
Hemoglobin: 13.3 g/dL (ref 12.0–15.0)
Potassium: 5.9 mmol/L — ABNORMAL HIGH (ref 3.5–5.1)
Sodium: 138 mmol/L (ref 135–145)

## 2016-08-28 LAB — BASIC METABOLIC PANEL
Anion gap: 7 (ref 5–15)
BUN: 11 mg/dL (ref 6–20)
CO2: 26 mmol/L (ref 22–32)
Calcium: 9.8 mg/dL (ref 8.9–10.3)
Chloride: 103 mmol/L (ref 101–111)
Creatinine, Ser: 0.52 mg/dL (ref 0.44–1.00)
GFR calc Af Amer: >60 mL/min (ref >60)
GFR calc non Af Amer: >60 mL/min (ref >60)
Glucose, Bld: 112 mg/dL — ABNORMAL HIGH (ref 65–99)
Potassium: 3.4 mmol/L — ABNORMAL LOW (ref 3.5–5.1)
Sodium: 136 mmol/L (ref 135–145)

## 2016-08-28 LAB — TROPONIN I
Troponin I: <0.03 ng/mL (ref <0.03)
Troponin I: <0.03 ng/mL (ref <0.03)
Troponin I: <0.03 ng/mL (ref <0.03)
Troponin I: <0.03 ng/mL (ref <0.03)

## 2016-08-28 LAB — MAGNESIUM: Magnesium: 1.8 mg/dL (ref 1.7–2.4)

## 2016-08-28 LAB — TSH: TSH: 1.322 u[IU]/mL (ref 0.350–4.500)

## 2016-08-28 SURGERY — BREAST LUMPECTOMY WITH SENTINEL LYMPH NODE BX
Anesthesia: General | Laterality: Right | Wound class: Clean

## 2016-08-28 MED ORDER — PROPOFOL 10 MG/ML IV BOLUS
INTRAVENOUS | Status: AC
  Filled 2016-08-28: qty 20

## 2016-08-28 MED ORDER — ACETAMINOPHEN 10 MG/ML IV SOLN
INTRAVENOUS | Status: DC | PRN
  Administered 2016-08-28: 1000 mg via INTRAVENOUS

## 2016-08-28 MED ORDER — HYDROCHLOROTHIAZIDE 25 MG PO TABS
25.0000 mg | ORAL_TABLET | Freq: Every day | ORAL | Status: DC
  Administered 2016-08-28 – 2016-08-29 (×2): 25 mg via ORAL
  Filled 2016-08-28 (×2): qty 1

## 2016-08-28 MED ORDER — INSULIN ASPART 100 UNIT/ML ~~LOC~~ SOLN
SUBCUTANEOUS | Status: AC
  Filled 2016-08-28: qty 10

## 2016-08-28 MED ORDER — DEXTROSE-NACL 5-0.2 % IV SOLN
INTRAVENOUS | Status: DC | PRN
  Administered 2016-08-28: 11:00:00 via INTRAVENOUS

## 2016-08-28 MED ORDER — ACETAMINOPHEN 325 MG PO TABS: 650.0000 mg | ORAL_TABLET | ORAL | Status: DC | PRN

## 2016-08-28 MED ORDER — PROMETHAZINE HCL 25 MG/ML IJ SOLN
INTRAMUSCULAR | Status: AC
  Administered 2016-08-28: 6.25 mg via INTRAVENOUS
  Filled 2016-08-28: qty 1

## 2016-08-28 MED ORDER — KETOROLAC TROMETHAMINE 30 MG/ML IJ SOLN
INTRAMUSCULAR | Status: AC
  Filled 2016-08-28: qty 1

## 2016-08-28 MED ORDER — LACTATED RINGERS IV SOLN
INTRAVENOUS | Status: DC
  Administered 2016-08-28: 09:00:00 via INTRAVENOUS

## 2016-08-28 MED ORDER — FENTANYL CITRATE (PF) 100 MCG/2ML IJ SOLN
25.0000 ug | INTRAMUSCULAR | Status: AC | PRN
  Administered 2016-08-28 (×6): 25 ug via INTRAVENOUS

## 2016-08-28 MED ORDER — MIDAZOLAM HCL 2 MG/2ML IJ SOLN
INTRAMUSCULAR | Status: AC
  Filled 2016-08-28: qty 2

## 2016-08-28 MED ORDER — MIDAZOLAM HCL 2 MG/2ML IJ SOLN
INTRAMUSCULAR | Status: DC | PRN
  Administered 2016-08-28: 2 mg via INTRAVENOUS

## 2016-08-28 MED ORDER — ACYCLOVIR 400 MG PO TABS
400.0000 mg | ORAL_TABLET | Freq: Three times a day (TID) | ORAL | Status: DC | PRN
  Filled 2016-08-28: qty 1

## 2016-08-28 MED ORDER — FENTANYL CITRATE (PF) 100 MCG/2ML IJ SOLN
INTRAMUSCULAR | Status: AC
  Administered 2016-08-28: 25 ug via INTRAVENOUS
  Filled 2016-08-28: qty 2

## 2016-08-28 MED ORDER — LIDOCAINE HCL (CARDIAC) 20 MG/ML IV SOLN
INTRAVENOUS | Status: DC | PRN
  Administered 2016-08-28: 80 mg via INTRAVENOUS

## 2016-08-28 MED ORDER — OXYCODONE HCL 5 MG PO TABS: 5.0000 mg | ORAL_TABLET | Freq: Once | ORAL | Status: DC | PRN

## 2016-08-28 MED ORDER — METOPROLOL SUCCINATE ER 25 MG PO TB24
25.0000 mg | ORAL_TABLET | Freq: Every day | ORAL | Status: DC
  Administered 2016-08-28 – 2016-08-29 (×2): 25 mg via ORAL
  Filled 2016-08-28 (×4): qty 1

## 2016-08-28 MED ORDER — ROCURONIUM BROMIDE 100 MG/10ML IV SOLN
INTRAVENOUS | Status: DC | PRN
  Administered 2016-08-28: 40 mg via INTRAVENOUS

## 2016-08-28 MED ORDER — CALCIUM CHLORIDE 10 % IV SOLN
INTRAVENOUS | Status: AC
  Filled 2016-08-28: qty 10

## 2016-08-28 MED ORDER — OXYCODONE HCL 5 MG/5ML PO SOLN: 5.0000 mg | Freq: Once | ORAL | Status: DC | PRN

## 2016-08-28 MED ORDER — SODIUM CHLORIDE 0.9 % IJ SOLN
INTRAMUSCULAR | Status: AC
  Administered 2016-08-28: 15:00:00
  Filled 2016-08-28: qty 10

## 2016-08-28 MED ORDER — MORPHINE SULFATE (PF) 2 MG/ML IV SOLN
2.0000 mg | INTRAVENOUS | Status: DC | PRN
  Administered 2016-08-28: 2 mg via INTRAVENOUS
  Filled 2016-08-28: qty 1

## 2016-08-28 MED ORDER — ONDANSETRON HCL 4 MG/2ML IJ SOLN
INTRAMUSCULAR | Status: DC | PRN
  Administered 2016-08-28: 4 mg via INTRAVENOUS

## 2016-08-28 MED ORDER — NITROGLYCERIN 2 % TD OINT
0.5000 [in_us] | TOPICAL_OINTMENT | Freq: Four times a day (QID) | TRANSDERMAL | Status: DC
  Administered 2016-08-28: 0.5 [in_us] via TOPICAL
  Filled 2016-08-28 (×2): qty 1

## 2016-08-28 MED ORDER — PHENYLEPHRINE HCL 10 MG/ML IJ SOLN
INTRAMUSCULAR | Status: DC | PRN
  Administered 2016-08-28 (×4): 100 ug via INTRAVENOUS
  Administered 2016-08-28: 200 ug via INTRAVENOUS
  Administered 2016-08-28 (×4): 100 ug via INTRAVENOUS

## 2016-08-28 MED ORDER — DEXAMETHASONE SODIUM PHOSPHATE 10 MG/ML IJ SOLN
INTRAMUSCULAR | Status: DC | PRN
  Administered 2016-08-28: 5 mg via INTRAVENOUS

## 2016-08-28 MED ORDER — PROPOFOL 10 MG/ML IV BOLUS
INTRAVENOUS | Status: DC | PRN
  Administered 2016-08-28: 50 mg via INTRAVENOUS
  Administered 2016-08-28: 150 mg via INTRAVENOUS

## 2016-08-28 MED ORDER — METHYLENE BLUE 0.5 % INJ SOLN
INTRAVENOUS | Status: AC
  Filled 2016-08-28: qty 10

## 2016-08-28 MED ORDER — ONDANSETRON HCL 4 MG/2ML IJ SOLN
INTRAMUSCULAR | Status: AC
  Filled 2016-08-28: qty 2

## 2016-08-28 MED ORDER — FENTANYL CITRATE (PF) 100 MCG/2ML IJ SOLN
INTRAMUSCULAR | Status: AC
  Filled 2016-08-28: qty 2

## 2016-08-28 MED ORDER — INSULIN ASPART 100 UNIT/ML ~~LOC~~ SOLN
SUBCUTANEOUS | Status: DC | PRN
  Administered 2016-08-28: 10 [IU] via INTRAVENOUS

## 2016-08-28 MED ORDER — ACETAMINOPHEN 10 MG/ML IV SOLN
INTRAVENOUS | Status: AC
  Filled 2016-08-28: qty 100

## 2016-08-28 MED ORDER — BUPIVACAINE-EPINEPHRINE (PF) 0.5% -1:200000 IJ SOLN
INTRAMUSCULAR | Status: DC | PRN
  Administered 2016-08-28: 30 mL

## 2016-08-28 MED ORDER — SUGAMMADEX SODIUM 200 MG/2ML IV SOLN
INTRAVENOUS | Status: AC
  Filled 2016-08-28: qty 2

## 2016-08-28 MED ORDER — EPHEDRINE SULFATE 50 MG/ML IJ SOLN
INTRAMUSCULAR | Status: DC | PRN
  Administered 2016-08-28: 5 mg via INTRAVENOUS

## 2016-08-28 MED ORDER — FAMOTIDINE 20 MG PO TABS
ORAL_TABLET | ORAL | Status: AC
  Administered 2016-08-28: 16:00:00
  Filled 2016-08-28: qty 1

## 2016-08-28 MED ORDER — FAMOTIDINE 20 MG PO TABS
20.0000 mg | ORAL_TABLET | Freq: Once | ORAL | Status: AC
  Administered 2016-08-28: 20 mg via ORAL

## 2016-08-28 MED ORDER — FENTANYL CITRATE (PF) 100 MCG/2ML IJ SOLN
INTRAMUSCULAR | Status: DC | PRN
  Administered 2016-08-28 (×4): 25 ug via INTRAVENOUS

## 2016-08-28 MED ORDER — MEPERIDINE HCL 50 MG/ML IJ SOLN: 6.2500 mg | INTRAMUSCULAR | Status: DC | PRN

## 2016-08-28 MED ORDER — KETOROLAC TROMETHAMINE 30 MG/ML IJ SOLN
INTRAMUSCULAR | Status: DC | PRN
  Administered 2016-08-28: 30 mg via INTRAVENOUS

## 2016-08-28 MED ORDER — LIDOCAINE HCL (PF) 2 % IJ SOLN
INTRAMUSCULAR | Status: AC
  Filled 2016-08-28: qty 2

## 2016-08-28 MED ORDER — TECHNETIUM TC 99M SULFUR COLLOID FILTERED
1.0000 | Freq: Once | INTRAVENOUS | Status: AC | PRN
  Administered 2016-08-28: 0.841 via INTRADERMAL

## 2016-08-28 MED ORDER — SUGAMMADEX SODIUM 200 MG/2ML IV SOLN
INTRAVENOUS | Status: DC | PRN
  Administered 2016-08-28: 150 mg via INTRAVENOUS

## 2016-08-28 MED ORDER — LISINOPRIL 10 MG PO TABS: 10.0000 mg | ORAL_TABLET | Freq: Every day | ORAL | Status: DC

## 2016-08-28 MED ORDER — DEXAMETHASONE SODIUM PHOSPHATE 10 MG/ML IJ SOLN
INTRAMUSCULAR | Status: AC
  Filled 2016-08-28: qty 1

## 2016-08-28 MED ORDER — PROMETHAZINE HCL 25 MG/ML IJ SOLN
6.2500 mg | INTRAMUSCULAR | Status: DC | PRN
  Administered 2016-08-28: 6.25 mg via INTRAVENOUS

## 2016-08-28 MED ORDER — IOPAMIDOL (ISOVUE-370) INJECTION 76%
75.0000 mL | Freq: Once | INTRAVENOUS | Status: AC | PRN
  Administered 2016-08-28: 75 mL via INTRAVENOUS

## 2016-08-28 MED ORDER — METHYLENE BLUE 0.5 % INJ SOLN
INTRAVENOUS | Status: DC | PRN
  Administered 2016-08-28: 4 mL

## 2016-08-28 MED ORDER — ENOXAPARIN SODIUM 40 MG/0.4ML ~~LOC~~ SOLN
40.0000 mg | SUBCUTANEOUS | Status: DC
  Administered 2016-08-29: 40 mg via SUBCUTANEOUS
  Filled 2016-08-28: qty 0.4

## 2016-08-28 MED ORDER — LEVOTHYROXINE SODIUM 100 MCG PO TABS
100.0000 ug | ORAL_TABLET | Freq: Every day | ORAL | Status: DC
  Administered 2016-08-29 (×2): 100 ug via ORAL
  Filled 2016-08-28 (×2): qty 1

## 2016-08-28 MED ORDER — ALBUTEROL SULFATE HFA 108 (90 BASE) MCG/ACT IN AERS
INHALATION_SPRAY | RESPIRATORY_TRACT | Status: DC | PRN
  Administered 2016-08-28: 6 via RESPIRATORY_TRACT

## 2016-08-28 MED ORDER — ROCURONIUM BROMIDE 50 MG/5ML IV SOLN
INTRAVENOUS | Status: AC
  Filled 2016-08-28: qty 1

## 2016-08-28 MED ORDER — CALCIUM CHLORIDE 10 % IV SOLN
INTRAVENOUS | Status: DC | PRN
  Administered 2016-08-28: 1 g via INTRAVENOUS

## 2016-08-28 MED ORDER — HYDROCODONE-ACETAMINOPHEN 5-325 MG PO TABS
1.0000 | ORAL_TABLET | ORAL | Status: DC | PRN
Start: 2016-08-28 — End: 2016-08-29
  Administered 2016-08-28 – 2016-08-29 (×4): 2 via ORAL
  Filled 2016-08-28 (×4): qty 2

## 2016-08-28 MED ORDER — BUPIVACAINE-EPINEPHRINE (PF) 0.5% -1:200000 IJ SOLN
INTRAMUSCULAR | Status: AC
  Filled 2016-08-28: qty 30

## 2016-08-28 SURGICAL SUPPLY — 53 items
BANDAGE ELASTIC 6 LF NS (GAUZE/BANDAGES/DRESSINGS) IMPLANT
BLADE SURG 15 STRL SS SAFETY (BLADE) ×3 IMPLANT
BNDG GAUZE 4.5X4.1 6PLY STRL (MISCELLANEOUS) IMPLANT
BRA SURGICAL MEDIUM (MISCELLANEOUS) ×3 IMPLANT
BULB RESERV EVAC DRAIN JP 100C (MISCELLANEOUS) IMPLANT
CANISTER SUCT 1200ML W/VALVE (MISCELLANEOUS) ×3 IMPLANT
CHLORAPREP W/TINT 26ML (MISCELLANEOUS) ×3 IMPLANT
CLOSURE WOUND 1/2 X4 (GAUZE/BANDAGES/DRESSINGS) ×1
CNTNR SPEC 2.5X3XGRAD LEK (MISCELLANEOUS) ×1
CONT SPEC 4OZ STER OR WHT (MISCELLANEOUS) ×2
CONTAINER SPEC 2.5X3XGRAD LEK (MISCELLANEOUS) ×1 IMPLANT
COVER PROBE FLX POLY STRL (MISCELLANEOUS) ×3 IMPLANT
DEVICE DUBIN SPECIMEN MAMMOGRA (MISCELLANEOUS) ×3 IMPLANT
DRAIN CHANNEL JP 15F RND 16 (MISCELLANEOUS) IMPLANT
DRAPE LAPAROTOMY TRNSV 106X77 (MISCELLANEOUS) ×3 IMPLANT
DRSG TELFA 3X8 NADH (GAUZE/BANDAGES/DRESSINGS) ×3 IMPLANT
DRSG TELFA 4X3 1S NADH ST (GAUZE/BANDAGES/DRESSINGS) ×6 IMPLANT
ELECT CAUTERY BLADE TIP 2.5 (TIP) ×3
ELECT REM PT RETURN 9FT ADLT (ELECTROSURGICAL) ×3
ELECTRODE CAUTERY BLDE TIP 2.5 (TIP) ×1 IMPLANT
ELECTRODE REM PT RTRN 9FT ADLT (ELECTROSURGICAL) ×1 IMPLANT
GAUZE FLUFF 18X24 1PLY STRL (GAUZE/BANDAGES/DRESSINGS) ×3 IMPLANT
GAUZE SPONGE 4X4 12PLY STRL (GAUZE/BANDAGES/DRESSINGS) IMPLANT
GLOVE BIO SURGEON STRL SZ7.5 (GLOVE) ×9 IMPLANT
GLOVE INDICATOR 8.0 STRL GRN (GLOVE) ×6 IMPLANT
GOWN STRL REUS W/ TWL LRG LVL3 (GOWN DISPOSABLE) ×2 IMPLANT
GOWN STRL REUS W/TWL LRG LVL3 (GOWN DISPOSABLE) ×4
KIT RM TURNOVER STRD PROC AR (KITS) ×3 IMPLANT
LABEL OR SOLS (LABEL) ×3 IMPLANT
MARGIN MAP 10MM (MISCELLANEOUS) ×3 IMPLANT
NDL SAFETY 22GX1.5 (NEEDLE) ×3 IMPLANT
NEEDLE HYPO 25X1 1.5 SAFETY (NEEDLE) ×6 IMPLANT
PACK BASIN MINOR ARMC (MISCELLANEOUS) ×3 IMPLANT
SHEARS FOC LG CVD HARMONIC 17C (MISCELLANEOUS) IMPLANT
SHEARS HARMONIC 9CM CVD (BLADE) IMPLANT
SLEVE PROBE SENORX GAMMA FIND (MISCELLANEOUS) ×3 IMPLANT
STRIP CLOSURE SKIN 1/2X4 (GAUZE/BANDAGES/DRESSINGS) ×2 IMPLANT
SUT ETHILON 3-0 FS-10 30 BLK (SUTURE) ×6
SUT SILK 2 0 (SUTURE) ×2
SUT SILK 2-0 18XBRD TIE 12 (SUTURE) ×1 IMPLANT
SUT VIC AB 2-0 CT1 27 (SUTURE) ×8
SUT VIC AB 2-0 CT1 TAPERPNT 27 (SUTURE) ×4 IMPLANT
SUT VIC AB 3-0 SH 27 (SUTURE) ×4
SUT VIC AB 3-0 SH 27X BRD (SUTURE) ×2 IMPLANT
SUT VIC AB 4-0 FS2 27 (SUTURE) ×6 IMPLANT
SUT VICRYL+ 3-0 144IN (SUTURE) ×3 IMPLANT
SUTURE EHLN 3-0 FS-10 30 BLK (SUTURE) ×2 IMPLANT
SWABSTK COMLB BENZOIN TINCTURE (MISCELLANEOUS) ×3 IMPLANT
SYR BULB IRRIG 60ML STRL (SYRINGE) IMPLANT
SYR CONTROL 10ML (SYRINGE) ×3 IMPLANT
SYRINGE 10CC LL (SYRINGE) ×3 IMPLANT
TAPE TRANSPORE STRL 2 31045 (GAUZE/BANDAGES/DRESSINGS) IMPLANT
WATER STERILE IRR 1000ML POUR (IV SOLUTION) ×3 IMPLANT

## 2016-08-28 NOTE — Progress Notes (Signed)
Patient states pain slightly better, now at 5/10, will continue to monitor.

## 2016-08-28 NOTE — Progress Notes (Addendum)
Patient c/o her "heart hurting", when asked patient to point to area of pain she pointed to left chest under breast, MD notified, EKG placed stat, nitroglycerin ointment ordered, following troponin, will give and continue to monitor.Rates pain 10 out of 10 on pain scale.  Interpreter at bedside.

## 2016-08-28 NOTE — Op Note (Signed)
Preoperative diagnosis: Right breast cancer.  Postoperative diagnosis: Same.  Operative procedure: Right breast wide excision with ultrasound guidance, sentinel lymph node biopsy.  Operating surgeon: Ollen Bowl, M.D.  Anesthesia: Gen. endotracheal., Marcaine 0.5% with 1-200,000 epinephrine, 30 mL.  Estimated blood loss: 15 mL.  Clinical note: This 61 year old woman was recently identified with a right breast cancer. She desired breast conservation. She was injected with technetium sulfur colloid prior to the procedure. The patient had been noted to be hypokalemic prior to surgery and received a total supplementation of 120 mEq of potassium chloride over the last 3 days. On arrival today her potassium was 5.9. EKG was unremarkable. It was elected to proceed.  Operative note: On induction of anesthesia the patient had what was described as an episode of V. tach which corrected itself within 30 seconds. After long discussion with anesthesia was elected to proceed. She underwent general endotracheal anesthesia. Shortly after that she had a very short run of SVT which resolved spontaneously. No further cardiac irregularities. The breast was prepped with alcohol and 4 mL of one half strength methylene blue injected in the subareolar plexus. The breast chest and neck was then prepped with ChloraPrep and draped. Ultrasound was used to identify the mass in the upper outer quadrant of the right breast. Area for excision was marked and local anesthetic infiltrated. A curvilinear incision from 10-1 was made and carried at the skin and subcutaneous tissue maintaining one-inch flaps. The lesion was excised with cutting current including the fascia of the pectoralis muscle. Specimen radiograph showed the lesion bordered the medial/inferior edge. This area was reexcised an additional 7 mm of tissue removed, again including the pectoralis fascia orientated and sent for routine histology. The axilla was opened  after instillation of local anesthetic. A 1 hot blue node and one hot non-blue node were identified. These were removed and sent for routine histology. Scanning through the remainder axilla showed no areas of increased counts.  The axillary wound was closed by approximating the axillary envelope with a 2-0 Vicryl figure-of-eight suture. The adipose layer was approximated similar fashion. The skin was closed with a running 4-0 Vicryl subcuticular suture.  The breast wound was closed by mobilizing the breast circumferentially off the underlying pectoralis fascia. The breast parenchyma was at approximately with interrupted 2-0 Vicryl sutures. This was done in multiple layers. The skin was approximately with a running 4-0 Vicryl subcuticular suture. Benzoin, Steri-Strips, Telfa dressing applied. Fluff gauze followed by a surgical bra was placed.  The patient tolerated the procedure well was taken to the recovery room in stable condition.

## 2016-08-28 NOTE — Anesthesia Procedure Notes (Signed)
Procedure Name: Intubation Date/Time: 08/28/2016 10:37 AM Performed by: Aline Brochure Pre-anesthesia Checklist: Patient identified, Emergency Drugs available, Suction available and Patient being monitored Patient Re-evaluated:Patient Re-evaluated prior to inductionOxygen Delivery Method: Circle system utilized Preoxygenation: Pre-oxygenation with 100% oxygen Intubation Type: IV induction Ventilation: Oral airway inserted - appropriate to patient size Laryngoscope Size: Mac and 3 Grade View: Grade II Tube type: Oral Tube size: 7.0 mm Number of attempts: 2 Airway Equipment and Method: Stylet Placement Confirmation: positive ETCO2 and breath sounds checked- equal and bilateral Secured at: 21 cm Tube secured with: Tape Dental Injury: Teeth and Oropharynx as per pre-operative assessment  Difficulty Due To: Difficult Airway- due to anterior larynx

## 2016-08-28 NOTE — Progress Notes (Signed)
Patient still c/o chest pain, states pain comes and goes, MD notified. Troponins negative x2. 2mg  morphine ordered q4hr PRN. Will give and continue to monitor.

## 2016-08-28 NOTE — Anesthesia Preprocedure Evaluation (Signed)
Anesthesia Evaluation  Patient identified by MRN, date of birth, ID band Patient awake    Reviewed: Allergy & Precautions, NPO status , Patient's Chart, lab work & pertinent test results  History of Anesthesia Complications Negative for: history of anesthetic complications  Airway Mallampati: III  TM Distance: >3 FB Neck ROM: Full    Dental no notable dental hx.    Pulmonary neg pulmonary ROS, neg sleep apnea, neg COPD,    breath sounds clear to auscultation- rhonchi (-) wheezing      Cardiovascular hypertension, Pt. on medications (-) CAD, (-) Past MI and (-) Cardiac Stents  Rhythm:Regular Rate:Normal - Systolic murmurs and - Diastolic murmurs    Neuro/Psych Anxiety negative neurological ROS     GI/Hepatic negative GI ROS, Neg liver ROS,   Endo/Other  negative endocrine ROSneg diabetesHypothyroidism   Renal/GU negative Renal ROS     Musculoskeletal negative musculoskeletal ROS (+)   Abdominal (+) - obese,   Peds  Hematology negative hematology ROS (+)   Anesthesia Other Findings Past Medical History: No date: Anxiety 08/2016: Breast cancer of upper-outer quadrant of right*     Comment: 1.2 cm ER+, PR+ Her 2 neu not overexpressed.  No date: Cancer [facility])     Comment: cervical No date: Hypertension No date: Thyroid disease   Reproductive/Obstetrics                             Anesthesia Physical Anesthesia Plan  ASA: II  Anesthesia Plan: General   Post-op Pain Management:    Induction: Intravenous  PONV Risk Score and Plan: 2 and Ondansetron, Dexamethasone, Propofol and Midazolam  Airway Management Planned: LMA  Additional Equipment:   Intra-op Plan:   Post-operative Plan:   Informed Consent: I have reviewed the patients History and Physical, chart, labs and discussed the procedure including the risks, benefits and alternatives for the proposed anesthesia with the  patient or authorized representative who has indicated his/her understanding and acceptance.   Dental advisory given  Plan Discussed with: CRNA and Anesthesiologist  Anesthesia Plan Comments: (Pt potassium was 3.1 in clinic and was started on potassium supplements, now 5.9 today, EKG repeated and no evidence of peek T waves or other abnormality, appears the same as preop EKG, instructed pt to stop taking potassium supplements)        Anesthesia Quick Evaluation

## 2016-08-28 NOTE — H&P (Signed)
No change in clinical history or exam.  Elevated potassium secondary to supplementation supplied as potassium was low at preadmit testing.  No symptoms.  Patient had been eating bananas as well as taking potassium supplements.  Plan: Wide excision, SLN biopsy.

## 2016-08-28 NOTE — Plan of Care (Signed)
Problem: Pain Managment: Goal: General experience of comfort will improve Outcome: Not Progressing Patient c/o 10 out of 10 chest pain and pain with inspiration. Nitro ordered, EKG ordered, troponins trending, morphine given, CT Angio ordered to r/o PE.

## 2016-08-28 NOTE — Transfer of Care (Signed)
Immediate Anesthesia Transfer of Care Note  Patient: Wanda Reed  Procedure(s) Performed: Procedure(s): BREAST LUMPECTOMY WITH SENTINEL LYMPH NODE BX (Right)  Patient Location: PACU  Anesthesia Type:General  Level of Consciousness: sedated  Airway & Oxygen Therapy: Patient connected to face mask oxygen  Post-op Assessment: Post -op Vital signs reviewed and stable  Post vital signs: stable  Last Vitals:  Vitals:   08/28/16 0851 08/28/16 1217  BP: (!) 151/69 (!) 125/52  Pulse: 70 84  Resp: 16 16  Temp: 36.4 C 36.2 C    Last Pain:  Vitals:   08/28/16 0851  TempSrc: Tympanic         Complications: No apparent anesthesia complications

## 2016-08-28 NOTE — Consult Note (Signed)
[place] [facility] [place] NAME: Wanda Reed    MR#:  [id number]  DATE OF BIRTH:  [birth year]  DATE OF ADMISSION:  08/28/2016  PRIMARY CARE PHYSICIAN: Denton Lank, MD   REQUESTING/REFERRING PHYSICIAN: DR Bary Castilla  CHIEF COMPLAINT:  Intraoperative arrhythmia, hypokalemia   HISTORY OF PRESENT ILLNESS:  Wanda Reed  is a 61 y.o. female with a known history of Breast cancer, anxiety, cervical cancer, hypertension, hypothyroidism, who presented to the hospital for right breast by the excision was ultrasound guidance, sentinel lymph node biopsy by Dr. Bary Castilla. Apparently, the Patient had mild hypokalemia preoperatively, she was given 20 mEq of potassium supplement twice a day for 3 days before operation, however, her potassium level today was found to be high at 5.9. Preoperative EKG today in the morning was normal, revealing normal sinus rhythm at 65 bpm, LVH but no acute ST-T changes. Intraoperatively patient was noted to be hypotensive, intermittently given epinephrine and Neo-Synephrine, and developed wide-complex tachycardia, which was felt to be SVT with aberrancy. It was treated with 1 g of calcium, NovoLog and glucose, albuterol inhaler, most recent blood glucose level was found to be 89. Patient is still somnolent postoperatively and not able to review systems. Hospitalist services were contacted for consultation in regards to arrhythmia.  PAST MEDICAL HISTORY:   Past Medical History:  Diagnosis Date  . Anxiety   . Breast cancer of upper-outer quadrant of right female breast ([place]) 08/2016   1.2 cm ER+, PR+ Her 2 neu not overexpressed.   . Cancer ([place])    cervical  . Hypertension   . Thyroid disease     PAST SURGICAL HISTOIRY:   Past Surgical History:  Procedure Laterality Date  . ABDOMINAL HYSTERECTOMY    . BREAST LUMPECTOMY Right 08/28/2016  . BREAST SURGERY Right    Breast Biopsy  . CERVIX SURGERY    . COLONOSCOPY   2008  . EYE SURGERY Bilateral    Cataract Extraction with IOL  . SHOULDER ARTHROSCOPY Right     SOCIAL HISTORY:   Social History  Substance Use Topics  . Smoking status: Never Smoker  . Smokeless tobacco: Never Used  . Alcohol use No    FAMILY HISTORY:   Family History  Problem Relation Age of Onset  . Breast cancer Neg Hx     DRUG ALLERGIES:  No Known Allergies  REVIEW OF SYSTEMS:  Not able to review systems due to patient's somnolence postoperatively/sedation  MEDICATIONS AT HOME:   Prior to Admission medications   Medication Sig Start Date End Date Taking? Authorizing Provider  acyclovir (ZOVIRAX) 400 MG tablet Take 400 mg by mouth 3 (three) times daily as needed (cold sore outbreaks).    Yes [provider]  hydrochlorothiazide (HYDRODIURIL) 25 MG tablet Take 25 mg by mouth daily.   Yes [provider]  levothyroxine (SYNTHROID, LEVOTHROID) 100 MCG tablet Take 100 mcg by mouth daily before breakfast.   Yes [provider]  lisinopril (PRINIVIL,ZESTRIL) 10 MG tablet Take 10 mg by mouth daily.   Yes [provider]  Potassium Chloride ER 20 MEQ TBCR Take 20 mEq by mouth 2 (two) times daily. 08/22/16  Yes Byrnett, Forest Gleason, MD      VITAL SIGNS:  Blood pressure (!) 125/52, pulse 84, temperature 97.1 F (36.2 C), resp. rate 16, height 5\' 2"  (1.575 m), weight 71.7 kg (158 lb), SpO2 100 %.  PHYSICAL EXAMINATION:  GENERAL:  61 y.o.-year-old patient lying  in the bed with no acute distress. Somnolent. Patient is on Ventimask at 6 L with good oxygen saturations EYES: Pupils equal, round, reactive to light and accommodation. No scleral icterus. Extraocular muscles intact.  HEENT: Head atraumatic, normocephalic. Oropharynx and nasopharynx clear.  NECK:  Supple, no jugular venous distention. No thyroid enlargement, no tenderness. Right breast has dressing LUNGS: Normal breath sounds bilaterally, no wheezing, rales,rhonchi , but bilateral  crepitations at bases. Not use of accessory muscles of respiration, patient is on a Ventimask at 6 L.  CARDIOVASCULAR: S1, S2 normal. No murmurs, rubs, or gallops.  ABDOMEN: Soft, nontender, nondistended. Bowel sounds present. No organomegaly or mass.  EXTREMITIES: No pedal edema, cyanosis, or clubbing.  NEUROLOGIC: Cranial nerves II through XII are intact. Muscle strength 5/5 in all extremities. Sensation intact. Gait not checked.  PSYCHIATRIC: The patient is sleepy, not able to assess orientation SKIN: No obvious rash, lesion, or ulcer.   LABORATORY PANEL:   CBC  Recent Labs Lab 08/28/16 0914  HGB 13.3  HCT 39.0   ------------------------------------------------------------------------------------------------------------------  Chemistries   Recent Labs Lab 08/28/16 0914  NA 138  K 5.9*  GLUCOSE 92   ------------------------------------------------------------------------------------------------------------------  Cardiac Enzymes No results for input(s): TROPONINI in the last 168 hours. ------------------------------------------------------------------------------------------------------------------  RADIOLOGY:  Nm Sentinel Node Injection  Result Date: 08/28/2016 CLINICAL DATA:  Right upper outer quadrant breast cancer. EXAM: NUCLEAR MEDICINE BREAST LYMPHOSCINTIGRAPHY TECHNIQUE: Intradermal injection of radiopharmaceutical was performed at the 12 o'clock, 3 o'clock, 6 o'clock, and 9 o'clock positions around the right nipple. The patient was then sent to the operating room where the sentinel node(s) were identified and removed by the surgeon. RADIOPHARMACEUTICALS:  Total of 0.841 mCi Millipore-filtered Technetium-38m sulfur colloid, divided into 4 aliquots and injected. IMPRESSION: Uncomplicated intradermal injection of a total of 0.841 mCi Technetium-40m sulfur colloid for purposes of sentinel node identification. Electronically Signed   By: Kathreen Devoid   On: 08/28/2016 09:12     EKG:   Orders placed or performed during the hospital encounter of 08/28/16  . EKG 12-Lead  . EKG 12-Lead   Preoperative EKG showed normal sinus rhythm at 65 bpm, LVH with no acute ST-T changes. Postoperative EKG is pending Rhythm strips revealed intermittent prolonged SVT with aberrancy at the rate of around 100 IMPRESSION AND PLAN:    Active Problems:   Hyperkalemia   Wide-complex tachycardia (HCC)  #1 wide-complex tachycardia, suspected SVT with aberrancy, admit patient to telemetry, initiate her on metoprolol orally or intravenously depending on her mental status, get BMP repeated, magnesium level, check troponin 3, check TSH to rule out heterogenic hyperthyroidism, get echo #2. Hyperkalemia, patient was treated with insulin, albuterol, intraoperatively, rechecking potassium levels, check magnesium level, supplement as needed #3. Hypoxia with lung crackles on physical exam, get chest x-ray to rule out CHF related to tachycardia #4. Essential hypertension, hold HCTZ, initiate patient on metoprolol #5. Right breast cancer, status post right breast excision, sentinel node biopsy, care per surgery   All the records are reviewed and case discussed with Consulting provider. Management plans discussed with the patient, family and they are in agreement.  CODE STATUS: Full code  TOTAL TIME TAKING CARE OF THIS PATIENT: 50 minutes.    Theodoro Grist M.D on 08/28/2016 at 1:01 PM  Between 7am to 6pm - Pager - [phone]  After 6pm go to [email or web address] - password [email or web address] [facility]  [facility]  Office  [phone]  CC: Primary care Physician: Denton Lank, MD

## 2016-08-28 NOTE — Anesthesia Post-op Follow-up Note (Cosign Needed)
Anesthesia QCDR form completed.        

## 2016-08-28 NOTE — Progress Notes (Signed)
Patient reported chest pain with inspiration, MD notified, chest angio ordered to r/o PE.

## 2016-08-28 NOTE — Anesthesia Postprocedure Evaluation (Signed)
Anesthesia Post Note  Patient: Corazon Nickolas  Procedure(s) Performed: Procedure(s) (LRB): BREAST LUMPECTOMY WITH SENTINEL LYMPH NODE BX (Right)  Patient location during evaluation: PACU Anesthesia Type: General Level of consciousness: awake and alert and oriented Pain management: pain level controlled Vital Signs Assessment: post-procedure vital signs reviewed and stable Respiratory status: spontaneous breathing, nonlabored ventilation and respiratory function stable Cardiovascular status: blood pressure returned to baseline and stable Postop Assessment: no signs of nausea or vomiting Anesthetic complications: no   Patient being admitted for further workup and management of hyperkalemia. Hospitalist consulted by surgeon. Potassium now 3.4.  Last Vitals:  Vitals:   08/28/16 1307 08/28/16 1317  BP:  (!) 119/51  Pulse: 78 70  Resp: (!) 25 16  Temp:      Last Pain:  Vitals:   08/28/16 1307  TempSrc:   PainSc: 5                  Luria Rosario

## 2016-08-29 ENCOUNTER — Other Ambulatory Visit: Payer: Self-pay | Admitting: Pathology

## 2016-08-29 ENCOUNTER — Telehealth: Payer: Self-pay | Admitting: General Surgery

## 2016-08-29 ENCOUNTER — Encounter: Payer: Self-pay | Admitting: General Surgery

## 2016-08-29 DIAGNOSIS — C50411 Malignant neoplasm of upper-outer quadrant of right female breast: Secondary | ICD-10-CM | POA: Diagnosis not present

## 2016-08-29 LAB — ECHOCARDIOGRAM COMPLETE
Area-P 1/2: 2.53 cm2
E decel time: 296 msec
E/e' ratio: 12.12
FS: 46 % — AB (ref 28–44)
Height: 62 in
IVS/LV PW RATIO, ED: 0.87
LA ID, A-P, ES: 38 mm
LA diam end sys: 38 mm
LA diam index: 2.12 cm/m2
LA vol A4C: 43.2 ml
LA vol index: 24.5 mL/m2
LA vol: 43.9 mL
LV E/e' medial: 12.12
LV E/e'average: 12.12
LV PW d: 14.1 mm — AB (ref 0.6–1.1)
LV e' LATERAL: 6.65 cm/s
Lateral S' vel: 11 cm/s
MV Dec: 296
MV Peak grad: 3 mmHg
MV pk A vel: 114 m/s
MV pk E vel: 80.6 m/s
P 1/2 time: 87 ms
TAPSE: 31.8 mm
TDI e' lateral: 6.65
TDI e' medial: 4.54
Weight: 2528 oz

## 2016-08-29 LAB — BASIC METABOLIC PANEL
Anion gap: 5 (ref 5–15)
BUN: 18 mg/dL (ref 6–20)
CO2: 27 mmol/L (ref 22–32)
Calcium: 8.8 mg/dL — ABNORMAL LOW (ref 8.9–10.3)
Chloride: 98 mmol/L — ABNORMAL LOW (ref 101–111)
Creatinine, Ser: 0.72 mg/dL (ref 0.44–1.00)
GFR calc Af Amer: >60 mL/min (ref >60)
GFR calc non Af Amer: >60 mL/min (ref >60)
Glucose, Bld: 122 mg/dL — ABNORMAL HIGH (ref 65–99)
Potassium: 4 mmol/L (ref 3.5–5.1)
Sodium: 130 mmol/L — ABNORMAL LOW (ref 135–145)

## 2016-08-29 MED ORDER — MENTHOL 3 MG MT LOZG
1.0000 | LOZENGE | OROMUCOSAL | Status: DC | PRN
  Administered 2016-08-29: 3 mg via ORAL
  Filled 2016-08-29: qty 9

## 2016-08-29 MED ORDER — SODIUM CHLORIDE 0.9 % IV BOLUS (SEPSIS)
1000.0000 mL | Freq: Once | INTRAVENOUS | Status: AC
  Administered 2016-08-29: 1000 mL via INTRAVENOUS

## 2016-08-29 NOTE — Progress Notes (Signed)
Afebrile. Mild hypotension, bradycardia. Reports pain behind the left breast, 10/10. Relieved with oral pain meds. No benefit from morphine. Lungs: Clear. Cardio: RR. Right breast: Surgery site clean. Minimal bruising. Left breast: No discernable abnormality. Labs, CT, med notes reviewed. No clear source for left sided chest pain.  Would plan to transfer to medicine service and follow as an outpatient. Have scheduled OV with me for Tuesday, June 19 at 9: 30 AM. Would send home w/ po Norco unless other medicine selected by hospitalist.

## 2016-08-29 NOTE — Progress Notes (Signed)
[place] [facility] [place] NAME: Wanda Reed    MR#:  [id number]  DATE OF BIRTH:  Jun 02, 1955  SUBJECTIVE:  CHIEF COMPLAINT:  Patient is resting comfortably. Ambulating to the bathroom without any difficulty. Pain is manageable with pain meds. Daughter at bedside  REVIEW OF SYSTEMS:  CONSTITUTIONAL: No fever, fatigue or weakness.  EYES: No blurred or double vision.  EARS, NOSE, AND THROAT: No tinnitus or ear pain.  RESPIRATORY: No cough, shortness of breath, wheezing or hemoptysis.  CARDIOVASCULAR: No chest pain, orthopnea, edema. Reports anterior chest wall pain from the recent surgery GASTROINTESTINAL: No nausea, vomiting, diarrhea or abdominal pain.  GENITOURINARY: No dysuria, hematuria.  ENDOCRINE: No polyuria, nocturia,  HEMATOLOGY: No anemia, easy bruising or bleeding SKIN: No rash or lesion. MUSCULOSKELETAL: No joint pain or arthritis.   NEUROLOGIC: No tingling, numbness, weakness.  PSYCHIATRY: No anxiety or depression.   DRUG ALLERGIES:  No Known Allergies  VITALS:  Blood pressure (!) 96/35, pulse 66, temperature 97.8 F (36.6 C), temperature source Oral, resp. rate 16, height 5\' 2"  (1.575 m), weight 71.7 kg (158 lb), SpO2 95 %.  PHYSICAL EXAMINATION:  GENERAL:  61 y.o.-year-old patient lying in the bed with no acute distress.  EYES: Pupils equal, round, reactive to light and accommodation. No scleral icterus. Extraocular muscles intact.  HEENT: Head atraumatic, normocephalic. Oropharynx and nasopharynx clear.  NECK:  Supple, no jugular venous distention. No thyroid enlargement, no tenderness.  LUNGS: Normal breath sounds bilaterally, no wheezing, rales,rhonchi or crepitation. No use of accessory muscles of respiration.  CARDIOVASCULAR: S1, S2 normal. No murmurs, rubs, or gallops.  ABDOMEN: Soft, nontender, nondistended. Bowel sounds present. No organomegaly or mass.  EXTREMITIES: No pedal edema, cyanosis, or clubbing.   NEUROLOGIC: Cranial nerves II through XII are intact. Muscle strength 5/5 in all extremities. Sensation intact. Gait not checked.  PSYCHIATRIC: The patient is alert and oriented x 3.  SKIN: No obvious rash, lesion, or ulcer.    LABORATORY PANEL:   CBC  Recent Labs Lab 08/28/16 0914  HGB 13.3  HCT 39.0   ------------------------------------------------------------------------------------------------------------------  Chemistries   Recent Labs Lab 08/28/16 1250  NA 136  K 3.4*  CL 103  CO2 26  GLUCOSE 112*  BUN 11  CREATININE 0.52  CALCIUM 9.8  MG 1.8   ------------------------------------------------------------------------------------------------------------------  Cardiac Enzymes  Recent Labs Lab 08/28/16 2125  TROPONINI <0.03   ------------------------------------------------------------------------------------------------------------------  RADIOLOGY:  Ct Angio Chest Pe W Or Wo Contrast  Result Date: 08/28/2016 CLINICAL DATA:  Chest pain. Status post right lumpectomy for breast cancer today. EXAM: CT ANGIOGRAPHY CHEST WITH CONTRAST TECHNIQUE: Multidetector CT imaging of the chest was performed using the standard protocol during bolus administration of intravenous contrast. Multiplanar CT image reconstructions and MIPs were obtained to evaluate the vascular anatomy. CONTRAST:  75 cc Isovue 370 COMPARISON:  Portable chest obtained earlier today. FINDINGS: Cardiovascular: Normally opacified pulmonary arteries with no pulmonary arterial filling defects. Borderline enlarged heart. No pericardial fluid. Mediastinum/Nodes: No enlarged mediastinal, hilar, or axillary lymph nodes. Thyroid gland, trachea, and esophagus demonstrate no significant findings. Lungs/Pleura: Mild bilateral dependent atelectasis. Upper Abdomen: Lateral segment left lobe liver cyst. Musculoskeletal: Thoracic spine degenerative changes. Right breast postsurgical changes with multiple locules of air.  Review of the MIP images confirms the above findings. IMPRESSION: 1. No pulmonary emboli. 2. Mild bilateral dependent atelectasis. 3. Right breast postsurgical changes. Electronically Signed   By: Claudie Revering M.D.   On: 08/28/2016 19:26  Nm Sentinel Node Injection  Result Date: 08/28/2016 CLINICAL DATA:  Right upper outer quadrant breast cancer. EXAM: NUCLEAR MEDICINE BREAST LYMPHOSCINTIGRAPHY TECHNIQUE: Intradermal injection of radiopharmaceutical was performed at the 12 o'clock, 3 o'clock, 6 o'clock, and 9 o'clock positions around the right nipple. The patient was then sent to the operating room where the sentinel node(s) were identified and removed by the surgeon. RADIOPHARMACEUTICALS:  Total of 0.841 mCi Millipore-filtered Technetium-25m sulfur colloid, divided into 4 aliquots and injected. IMPRESSION: Uncomplicated intradermal injection of a total of 0.841 mCi Technetium-28m sulfur colloid for purposes of sentinel node identification. Electronically Signed   By: Kathreen Devoid   On: 08/28/2016 09:12   Dg Chest Port 1 View  Result Date: 08/28/2016 CLINICAL DATA:  61 year old female status post breast surgery postop day 0. Cardiac arrhythmia. EXAM: PORTABLE CHEST 1 VIEW COMPARISON:  Report of chest radiographs 04/04/2000 (no images available). FINDINGS: Portable AP semi upright view at 1322 hours. Pacer pad projects over the lower mediastinum. Cardiac size is at the upper limits of normal. Other mediastinal contours are within normal limits. Lung volumes are within normal limits. Allowing for portable technique the lungs are clear. Negative visible bowel gas pattern. IMPRESSION: No acute cardiopulmonary abnormality. Electronically Signed   By: Genevie Ann M.D.   On: 08/28/2016 13:50    EKG:   Orders placed or performed during the hospital encounter of 08/28/16  . EKG 12-Lead  . EKG 12-Lead  . EKG 12-Lead  . EKG 12-Lead  . EKG 12-Lead  . EKG 12-Lead    ASSESSMENT AND PLAN:    #1 wide-complex  tachycardia, suspected SVT with aberrancy Patient is monitored on telemetry , hyperkalemia resolved   initiated her on metoprolol orally  Acute MI ruled out with negative troponins TSH normal Chest x-ray normal Echocardiogram with the normal ejection fraction 60-65%, abnormal left ventricular relaxation   #2. Hyperkalemia, and hyponatremia  patient was treated with insulin, albuterol, intraoperatively  rechecking potassium at 4.0 supplement as needed Will give her IV fluid bolus with normal saline prior to discharge  Encouraged patient to drink lots of fluids, PCP to consider repeating BMP during her repeat follow-up visit  #3. Hypoxia with lung crackles on physical exam secondary to poor inspiratory effort from the chest wall pain, chest x-ray negative  Incentive spirometry   #5. Right breast cancer, status post right breast excision, sentinel node biopsy, care per surgery  Norco as needed for pain outpatient follow-up with surgery as recommended    okay to discharge patient from  hospitalist standpoint   All the records are reviewed and case discussed with Care Management/Social Workerr. Management plans discussed with the patient, family and they are in agreement.  CODE STATUS: fc   TOTAL TIME TAKING CARE OF THIS PATIENT: 35  minutes.     Note: This dictation was prepared with Dragon dictation along with smaller phrase technology. Any transcriptional errors that result from this process are unintentional.   Nicholes Mango M.D on 08/29/2016 at 11:55 AM  Between 7am to 6pm - Pager - [phone] After 6pm go to [email or web address] - password [email or web address] [facility]  [facility]  Office  [phone]  CC: Primary care physician; Denton Lank, MD

## 2016-08-29 NOTE — Clinical Social Work Note (Signed)
CSW received referral for SNF.  Case discussed with case manager and plan is to discharge home.  CSW to sign off please re-consult if social work needs arise.  Torin Modica R. Orelia Brandstetter, MSW, LCSWA [phone]  

## 2016-08-29 NOTE — Progress Notes (Signed)
Discharged to home with her family.  Medicated for pain prior to discharge.  Daughter very attentive to the discharge instructions.

## 2016-08-29 NOTE — Plan of Care (Signed)
Problem: Pain Managment: Goal: General experience of comfort will improve Outcome: Completed/Met Date Met: 08/29/16 Pain is well controlled with pain meds.  She has a prescription.

## 2016-08-29 NOTE — Care Management (Addendum)
Placed in observation s/p right breast wide excision due to preop elevated potassium. Independent in all adls, denies issues accessing medical care, obtaining medications or with transportation- but patient does not have insurance. She is followed by [facility] and her daughters pay for her medications.  she lives with her daughters.  Current with her PCP.  A medicaid application has been initiated. She is being seen at [facility] for newly diagnosed breast cancer.  Discussed that cancer center has staff that can address medication concerns regarding to cancer treatment.  Discussed that CM will assess discharge medication that may arise from this hospitalization.  Provided [facility] applications in [place].  CM did not identify need for nursing home placement.

## 2016-08-29 NOTE — Telephone Encounter (Signed)
Daughter notified path fine, margins clear and lymph glands negative. F/U on June 19 as scheduled.

## 2016-09-02 ENCOUNTER — Encounter: Payer: Self-pay | Admitting: General Surgery

## 2016-09-02 ENCOUNTER — Inpatient Hospital Stay (INDEPENDENT_AMBULATORY_CARE_PROVIDER_SITE_OTHER): Payer: Medicaid Other

## 2016-09-02 ENCOUNTER — Ambulatory Visit: Payer: Medicaid Other | Admitting: General Surgery

## 2016-09-02 VITALS — BP 132/78 | HR 74 | Resp 12 | Ht 61.0 in | Wt 158.0 lb

## 2016-09-02 DIAGNOSIS — Z170 Estrogen receptor positive status [ER+]: Secondary | ICD-10-CM

## 2016-09-02 DIAGNOSIS — C50411 Malignant neoplasm of upper-outer quadrant of right female breast: Secondary | ICD-10-CM

## 2016-09-02 NOTE — Patient Instructions (Addendum)
Plan to schedule an appointment with Dr. Baruch Gouty to discuss options regarding radiation. Internal Radiation Therapy Internal radiation therapy is a procedure to treat cancer. It uses a type of energy (ionizing radiation) to kill or shrink cancer cells. This therapy may be used to treat many different types of cancer. Ionizing radiation is put inside your body close to a tumor or directly into a tumor. The ionizing radiation is usually encased in some type of holder (implant). This may be a capsule, seed, or tube. It may also be in the form of a ribbon, balloon, wire, or needle. Your health care provider will use a tool (delivery device) to place the implant in your body. Your health care provider may use a long, thin tube (catheter), a needle, or another type of applicator to do this. There are different types of internal radiation therapy:  Brachytherapy. There are different ways of receiving brachytherapy: ? A low-dose implant gives off a low dose of radiation for one to several days, then it is removed. The delivery device may be left in place or removed between treatments. ? A high-dose implant gives off a high dose of radiation for only a few minutes, then it is removed. The delivery device may be left in place or removed between treatments. ? A permanent implant stays in your body after placement. It will give off radiation over weeks or months. After the radiation is gone, the implant can remain harmlessly inside your body.  Intraoperative radiation therapy. This type of radiation is delivered in the operating room when the tumor is removed. This brings radiation exactly to the site of tumor removal, in case the surgeon cannot remove all cancer cells.  Tell a health care provider about:  Any allergies you have.  All medicines you are taking, including vitamins, herbs, eye drops, creams, and over-the-counter medicines.  Any problems you or family members have had with anesthetic  medicines.  Any blood disorders you have.  Any surgeries you have had.  Any medical conditions you have, including the possibility of pregnancy.  If you are breastfeeding. What are the risks? Generally, this is a safe procedure. However, problems may occur, including:  Bleeding.  Infection.  Tissue damage.  Treatment failure.  Ask your health care provider if you have other risks that are specific to the type and location of your cancer. What happens before the procedure?  You will have a complete physical exam. You may also have imaging studies and other diagnostic tests.  Your health care provider may give you a specific diet to follow before the procedure. Follow instructions from your health care provider about eating and drinking restrictions.  You may need to prepare your bowel for internal radiation therapy. Follow instructions from your health care provider about any bowel prep.  Ask your health care provider about: ? Changing or stopping your regular medicines. This is especially important if you are taking diabetes medicines or blood thinners. ? Taking medicines such as aspirin and ibuprofen. These medicines can thin your blood. Do not take these medicines before your procedure if your health care provider instructs you not to.  Do not drink alcohol as directed by your health care provider.  Do not use any tobacco products, including cigarettes, chewing tobacco, and e-cigarettes as directed by your health care provider.  Ask your health care provider how your surgical site will be marked or identified.  You may be given antibiotic medicine to help prevent infection. What happens during the procedure?  To reduce your risk of infection: ? Your health care team will wash or sanitize their hands. ? Your skin will be washed with soap.  An IV tube may be inserted into one of your veins.  You will be given one or more of the following: ? A medicine to help you  relax (sedative). ? A medicine to numb the area (local anesthetic). ? A medicine to make you fall asleep (general anesthetic).  If a catheter will be used to deliver radiation, your health care provider may make an incision in your skin where the catheter can be inserted.  Your health care provider may use imaging procedures to guide the delivery device to location of the tumor. These may include: ? X-ray. ? Ultrasound. ? MRI. ? CT scan.  After the implant has been put in place, the delivery device will be removed, if necessary.  If an incision was made to deliver the radiation implant, the incision will be closed with stitches (sutures), surgical staples, or skin adhesive tape.  A bandage (dressing) may be placed over the incision. The procedure may vary among health care providers and hospitals. What happens after the procedure?  Your blood pressure, heart rate, breathing rate and blood oxygen level will be monitored often until the medicines you were given have worn off.  If you are having low-dose or high-dose internal radiation treatment over several days, you may need to stay in the hospital during treatment.  If you are having high-dose treatment, you may need to avoid contact with people during your hospital stay. You may also need to avoid contact for a certain amount of time after you leave the hospital. Ask your health care provider if this applies to you. This information is not intended to replace advice given to you by your health care provider. Make sure you discuss any questions you have with your health care provider. Document Released: 07/18/2014 Document Revised: 08/09/2015 Document Reviewed: 02/01/2014 Elsevier Interactive Patient Education  2018 [place] interna (Internal Radiation Therapy) La radioterapia interna es un procedimiento que se realiza para Lawyer. En este procedimiento se [place] un tipo de energa (radiacin [employer])  para destruir o reducir el tamao de las clulas cancerosas. Este tratamiento se puede Risk manager para tratar muchos tipos diferentes de cncer. La fuente de radiacin ionizante se coloca dentro de organismo, cerca de un tumor o directamente dentro de este. Generalmente, est sellada dentro de algn tipo de envase (implante). Este puede ser una cpsula, una semilla o un tubo. Tambin puede ser [employer], un baln, un alambre o [place]. El mdico usar una herramienta (dispositivo de insercin) para Dispensing optician implante dentro del organismo. Rochele Raring, puede usar un tubo largo y delgado (catter), Maxwell Caul u otro tipo de Education officer, museum. Hay diferentes tipos de radioterapias internas:  Braquiterapia. Existen distintas maneras de recibir braquiterapia: ? Un implante de dosis baja libera una dosis baja de radiacin en el trmino [place], y luego es retirado. El dispositivo de insercin puede dejarse puesto o retirarse entre un tratamiento y el siguiente. ? Un implante de dosis alta libera una dosis alta de radiacin durante unos pocos minutos solamente y luego es retirado. El dispositivo de insercin puede dejarse puesto o retirarse entre un tratamiento y el siguiente. ? Un implante permanente se deja en el organismo despus de su colocacin. Este liberar radiacin durante semanas o meses. Una vez que la radiacin desaparece, el implante puede permanecer dentro del organismo  sin causar ningn dao.  Radioterapia intraoperatoria. Este tipo de radiacin se administra en el quirfano cuando se extirpa el tumor. Permite dirigir la radiacin exactamente al lugar de donde se extrajo el tumor, en caso de que el cirujano no pueda extirpar todas las clulas cancerosas. INFORME A SU MDICO:  Cualquier alergia que tenga.  Todos los [employer], incluidos vitaminas, hierbas, gotas oftlmicas, cremas y medicamentos de venta libre.  Problemas previos que usted o los [employer] de su familia  hayan tenido con el uso de anestsicos.  Enfermedades de la sangre que tenga.  Si tiene cirugas previas.  Cualquier enfermedad que tenga, incluso la posibilidad de que est [place].  Si est amamantando. RIESGOS Y COMPLICACIONES En general, se trata de un procedimiento seguro. Sin embargo, pueden presentarse problemas, por ejemplo:  Hemorragia.  Infeccin.  Dao [employer] tejidos.  Arther Abbott del tratamiento. Pregntele al mdico si tiene otros riesgos que sean especficos del tipo y la ubicacin del cncer que padece. ANTES DEL PROCEDIMIENTO  Le realizarn un examen fsico completo. Adems, pueden hacerle estudios de diagnstico por imgenes y otros estudios de diagnstico.  Es posible que el mdico le indique una dieta especfica que debe seguir antes del procedimiento. Siga las indicaciones del mdico respecto de las restricciones para las comidas y las bebidas.  Es posible que tenga que preparar el intestino para la radioterapia interna. Siga las indicaciones del mdico acerca de la preparacin intestinal.  Consulte a su mdico acerca de estos temas: ? Cambiar o suspender los medicamentos que toma habitualmente. Esto es muy importante si toma medicamentos para la diabetes o anticoagulantes. ? Tomar medicamentos, como aspirina e ibuprofeno. Estos medicamentos pueden tener un efecto anticoagulante en la [place]. No tome estos medicamentos antes del procedimiento si el mdico le indica que no lo haga.  No beba alcohol como se lo haya indicado el mdico.  No consuma productos que contengan tabaco, entre ellos, cigarrillos, tabaco de Higher education careers adviser y Psychologist, sport and exercise como se lo haya indicado el mdico.  Pregntele al mdico cmo se Scientist, clinical (histocompatibility and immunogenetics) o se Museum/gallery curator de la Leisure centre manager.  Pueden darle antibiticos para ayudar a prevenir las infecciones. PROCEDIMIENTO  Para reducir el riesgo de infecciones: ? El equipo mdico se lavar o se desinfectar las manos. ? Le lavarn la piel  con jabn.  Pueden colocarle una va intravenosa (IV) en una de las venas.  Podrn administrarle uno o ms de los siguientes medicamentos: ? Un medicamento para ayudarlo a relajarse (sedante). ? Un medicamento para adormecer la zona (anestesia local). ? Un medicamento que lo har dormir (anestesia general).  Si se usar un catter para Public affairs consultant fuente de radiacin, el mdico puede hacerle una incisin en la piel en el lugar donde este se puede insertar.  El mdico usar procedimientos de diagnstico por imgenes para guiar el dispositivo de insercin Licensed conveyancer del tumor. Estos pueden incluir lo siguiente: ? Radiografas. ? Ecografa. ? Resonancia magntica. ? Tomografa computarizada.  Una vez colocado el implante, se retirar el dispositivo de insercin, si es necesario.  Si se realiz una incisin para colocar el implante de radiacin, se la cerrar con puntos (suturas), grapas quirrgicas o cinta adhesiva para la piel.  Pueden colocarle una venda (vendaje) sobre TEFL teacher de la incisin. Este procedimiento puede variar segn el mdico y el hospital. DESPUS DEL PROCEDIMIENTO  Marin Comment controlarn con frecuencia la presin arterial, la frecuencia cardaca, la frecuencia respiratoria y Retail buyer de oxgeno en la sangre hasta que haya  desaparecido el efecto de los medicamentos administrados.  Si recibe radioterapia interna con dosis baja o alta durante varios das, tal vez deba permanecer en el hospital durante [place].  Si recibe tratamiento con dosis alta, es posible que tenga que evitar el contacto con personas durante su hospitalizacin. Adems, quizs deba evitar el contacto durante cierto tiempo despus de dejar el hospital. Pregntele al mdico si esto es vlido para su caso. Esta informacin no tiene Marine scientist el consejo del mdico. Asegrese de hacerle al mdico cualquier pregunta que tenga. Document Released: 07/18/2014 Document Revised: 07/18/2014 Document  Reviewed: 02/01/2014 Elsevier Interactive Patient Education  [employer].

## 2016-09-02 NOTE — Progress Notes (Signed)
Patient ID: Wanda Reed, female   DOB: [birth year], 61 y.o.   MRN: [id number]  Chief Complaint  Patient presents with  . Routine Post Op    right lumpectomy    HPI Wanda Reed is a 61 y.o. female is here today for a post op right lumpectomy done on 08/28/16. Patient states she is doing well. She is accompanied with her daughter Wanda Reed.  The patient was diagnosed with hyperkalemia prior to surgery, likely secondary to a hemolyzed sample. Blood potassium was normal after surgery. She did have some cardiac arrhythmias on induction of anesthesia and was subsequently admitted for overnight observation. Troponins were negative and a CT angiogram for PE was negative as well. She is presently asymptomatic with no shortness of breath or chest pain. Minimal right breast discomfort.  Interpreter Wanda Reed is present. HPI  Past Medical History:  Diagnosis Date  . Anxiety   . Breast cancer of upper-outer quadrant of right female breast ([place]) 08/2016   1.0 cm ER+, PR-, Her 2 neu not overexpressed. Node negative. T1b, N0.  [name] Cancer (HCC)    cervical  . Hypertension   . Thyroid disease     Past Surgical History:  Procedure Laterality Date  . ABDOMINAL HYSTERECTOMY    . BREAST LUMPECTOMY Right 08/28/2016  . BREAST LUMPECTOMY WITH SENTINEL LYMPH NODE BIOPSY Right 08/28/2016   Procedure: BREAST LUMPECTOMY WITH SENTINEL LYMPH NODE BX;  Surgeon: [name], MD;  Location: [facility] ORS;  Service: General;  Laterality: Right;  . BREAST SURGERY Right    Breast Biopsy  . CERVIX SURGERY    . COLONOSCOPY  2008  . EYE SURGERY Bilateral    Cataract Extraction with IOL  . SHOULDER ARTHROSCOPY Right     Family History  Problem Relation Age of Onset  . Breast cancer Neg Hx     Social History Social History  Substance Use Topics  . Smoking status: Never Smoker  . Smokeless tobacco: Never Used  . Alcohol use No    No Known Allergies  Current Outpatient Prescriptions   Medication Sig Dispense Refill  . acyclovir (ZOVIRAX) 400 MG tablet Take 400 mg by mouth 3 (three) times daily as needed (cold sore outbreaks).     . hydrochlorothiazide (HYDRODIURIL) 25 MG tablet Take 25 mg by mouth daily.    [name] levothyroxine (SYNTHROID, LEVOTHROID) 100 MCG tablet Take 100 mcg by mouth daily before breakfast.    . lisinopril (PRINIVIL,ZESTRIL) 10 MG tablet Take 10 mg by mouth daily.    . Potassium Chloride ER 20 MEQ TBCR Take 20 mEq by mouth 2 (two) times daily. 60 tablet 1   No current facility-administered medications for this visit.     Review of Systems Review of Systems  Constitutional: Negative.   Respiratory: Negative.   Cardiovascular: Negative.     Blood pressure 132/78, pulse 74, resp. rate 12, height '5\' 1"'  (1.549 m), weight 158 lb (71.7 kg).  Physical Exam Physical Exam  Constitutional: She is oriented to person, place, and time. She appears well-developed and well-nourished.  Cardiovascular: Normal rate, regular rhythm and normal heart sounds.   Pulmonary/Chest: Effort normal and breath sounds normal. Right breast exhibits tenderness.  Minimal bruising right breast-incisions healing well   Neurological: She is alert and oriented to person, place, and time.  Skin: Skin is warm and dry.    Data Reviewed Pathology showed a 1.0 cm tumor, margins clear. Node negative. ER positive, PR negative, HER-2/neu not overexpressed. (Imaging had reported a 1.2  cm tumor) sees.  Ultrasound examination of the breast was completed to determine if the patient would be a candidate for accelerated partial breast radiation. There is a well-defined cavity approximately a minimum of 1.56 cm below the skin measuring 1.1 x 2.1 x 5.5 cm.  Assessment    Doing well status post wide excision right upper outer quadrant breast tumor.  Resolution of left chest pain requiring observation and subsequent cardiology workup after surgery.    Plan    Options for radiation therapy  reviewed in detail.      Plan to schedule an appointment with Dr. Baruch Gouty to discuss options regarding radiation.    HPI, Physical Exam, Assessment and Plan have been scribed under the direction and in the presence of Hervey Ard, MD.  [name], CMA  I have completed the exam and reviewed the above documentation for accuracy and completeness.  I agree with the above.  Haematologist has been used and any errors in dictation or transcription are unintentional.  Hervey Ard, M.D., F.A.C.S.   [name] 09/02/2016, 8:40 PM

## 2016-09-03 ENCOUNTER — Encounter: Payer: Self-pay | Admitting: *Deleted

## 2016-09-03 NOTE — Patient Instructions (Signed)
Mammaprint orders faxed to [place].

## 2016-09-08 LAB — SURGICAL PATHOLOGY

## 2016-09-10 ENCOUNTER — Ambulatory Visit
Admission: RE | Admit: 2016-09-10 | Discharge: 2016-09-10 | Disposition: A | Discharge status: Home / Self Care | Payer: Medicaid Other | Source: Ambulatory Visit | Attending: Radiation Oncology | Admitting: Radiation Oncology

## 2016-09-10 VITALS — BP 129/68 | HR 75 | Wt 159.7 lb

## 2016-09-10 DIAGNOSIS — I10 Essential (primary) hypertension: Secondary | ICD-10-CM | POA: Insufficient documentation

## 2016-09-10 DIAGNOSIS — Z170 Estrogen receptor positive status [ER+]: Secondary | ICD-10-CM | POA: Insufficient documentation

## 2016-09-10 DIAGNOSIS — F419 Anxiety disorder, unspecified: Secondary | ICD-10-CM | POA: Diagnosis not present

## 2016-09-10 DIAGNOSIS — E079 Disorder of thyroid, unspecified: Secondary | ICD-10-CM | POA: Insufficient documentation

## 2016-09-10 DIAGNOSIS — Z510 Encounter for antineoplastic radiation therapy: Secondary | ICD-10-CM | POA: Insufficient documentation

## 2016-09-10 DIAGNOSIS — C50411 Malignant neoplasm of upper-outer quadrant of right female breast: Secondary | ICD-10-CM | POA: Diagnosis not present

## 2016-09-10 DIAGNOSIS — Z79899 Other long term (current) drug therapy: Secondary | ICD-10-CM | POA: Diagnosis not present

## 2016-09-10 NOTE — Progress Notes (Signed)
NEW PATIENT EVALUATION  Name: Wanda Reed  MRN: [id number]  Date:   09/10/2016     DOB: 1955/12/23   This 61 y.o. female patient presents to the clinic for initial evaluation of stage I (T1 BN 0 M0 ER positive PR negative HER-2/neu negative invasive mammary carcinoma status post wide local excision and reexcision with sentinel node biopsy now for radiation oncology opinion.  REFERRING PHYSICIAN: Denton Lank, MD  CHIEF COMPLAINT:  Chief Complaint  Patient presents with  . Breast Cancer    Initial Evaluation    DIAGNOSIS: The encounter diagnosis was Malignant neoplasm of upper-outer quadrant of right breast in female, estrogen receptor positive ([place]).   PREVIOUS INVESTIGATIONS:  Mammograms and ultrasound reviewed Pathology reports reviewed Clinical notes reviewed  HPI: Patient is a Spanish-speaking 61 year old female accompanied by her daughter who speaks perfect [place]. She presented with an abnormal mammogram showing a lesion of the right breast at the 11:30 position 3 cm from the nipple measuring approximately 1.2 cm in greatest dimension confirmed on ultrasound. Ultrasound guided biopsy was positive for grade 2 invasive mammary carcinoma ER positive PR negative HER-2/neu not overexpressed. She went on to have a wide local excision measuring 1 cm with margins clear at 0.3 mm from posterior inferior margin. 2 sentinel lymph nodes were negative for metastatic disease. She is still somewhat sore although is healing well. She's been declined based on the size of the tumor for systemic chemotherapy. Her seroma cavity has been examined by surgeon and is suitable for MammoSite balloon placement. She seen today for radiation oncology opinion.  PLANNED TREATMENT REGIMEN: Accelerated partial breast irradiation to the right breast  PAST MEDICAL HISTORY:  has a past medical history of Anxiety; Breast cancer of upper-outer quadrant of right female breast ([place]) (08/2016); Cancer ([place]);  Hypertension; and Thyroid disease.    PAST SURGICAL HISTORY:  Past Surgical History:  Procedure Laterality Date  . ABDOMINAL HYSTERECTOMY    . BREAST LUMPECTOMY Right 08/28/2016  . BREAST LUMPECTOMY WITH SENTINEL LYMPH NODE BIOPSY Right 08/28/2016   Procedure: BREAST LUMPECTOMY WITH SENTINEL LYMPH NODE BX;  Surgeon: [name], MD;  Location: [facility] ORS;  Service: General;  Laterality: Right;  . BREAST SURGERY Right    Breast Biopsy  . CERVIX SURGERY    . COLONOSCOPY  2008  . EYE SURGERY Bilateral    Cataract Extraction with IOL  . SHOULDER ARTHROSCOPY Right     FAMILY HISTORY: family history is not on file.  SOCIAL HISTORY:  reports that she has never smoked. She has never used smokeless tobacco. She reports that she does not drink alcohol or use drugs.  ALLERGIES: Patient has no known allergies.  MEDICATIONS:  Current Outpatient Prescriptions  Medication Sig Dispense Refill  . acyclovir (ZOVIRAX) 400 MG tablet Take 400 mg by mouth 3 (three) times daily as needed (cold sore outbreaks).     . hydrochlorothiazide (HYDRODIURIL) 25 MG tablet Take 25 mg by mouth daily.    [name] levothyroxine (SYNTHROID, LEVOTHROID) 100 MCG tablet Take 100 mcg by mouth daily before breakfast.    . lisinopril (PRINIVIL,ZESTRIL) 10 MG tablet Take 10 mg by mouth daily.    . Potassium Chloride ER 20 MEQ TBCR Take 20 mEq by mouth 2 (two) times daily. 60 tablet 1   No current facility-administered medications for this encounter.     ECOG PERFORMANCE STATUS:  1 - Symptomatic but completely ambulatory  REVIEW OF SYSTEMS:  Patient denies any weight loss, fatigue, weakness, fever, chills or  night sweats. Patient denies any loss of vision, blurred vision. Patient denies any ringing  of the ears or hearing loss. No irregular heartbeat. Patient denies heart murmur or history of fainting. Patient denies any chest pain or pain radiating to her upper extremities. Patient denies any shortness of breath, difficulty  breathing at night, cough or hemoptysis. Patient denies any swelling in the lower legs. Patient denies any nausea vomiting, vomiting of blood, or coffee ground material in the vomitus. Patient denies any stomach pain. Patient states has had normal bowel movements no significant constipation or diarrhea. Patient denies any dysuria, hematuria or significant nocturia. Patient denies any problems walking, swelling in the joints or loss of balance. Patient denies any skin changes, loss of hair or loss of weight. Patient denies any excessive worrying or anxiety or significant depression. Patient denies any problems with insomnia. Patient denies excessive thirst, polyuria, polydipsia. Patient denies any swollen glands, patient denies easy bruising or easy bleeding. Patient denies any recent infections, allergies or URI. Patient "s visual fields have not changed significantly in recent time.    PHYSICAL EXAM: BP 129/68   Pulse 75   Wt 159 lb 11.6 oz (72.4 kg)   BMI 30.18 kg/m  Patient is status post wide local excision and sentinel node biopsy the right breast both incisions are healing well. No dominant mass or nodularity is noted in either breast in 2 positions examined. No axillary or supraclavicular adenopathy is identified. Well-developed well-nourished patient in NAD. HEENT reveals PERLA, EOMI, discs not visualized.  Oral cavity is clear. No oral mucosal lesions are identified. Neck is clear without evidence of cervical or supraclavicular adenopathy. Lungs are clear to A&P. Cardiac examination is essentially unremarkable with regular rate and rhythm without murmur rub or thrill. Abdomen is benign with no organomegaly or masses noted. Motor sensory and DTR levels are equal and symmetric in the upper and lower extremities. Cranial nerves II through XII are grossly intact. Proprioception is intact. No peripheral adenopathy or edema is identified. No motor or sensory levels are noted. Crude visual fields are  within normal range.  LABORATORY DATA: Pathology reports are reviewed and compatible with the above-stated findings    RADIOLOGY RESULTS: Mammograms and ultrasound reviewed and compatible above-stated findings   IMPRESSION: Stage I invasive mammary carcinoma the right breast status post wide local excision and sentinel node biopsy ER/PR positive PR negative in 61 year old female suitable for accelerated partial breast irradiation  PLAN: At this time I believe patient is a suitable candidate for accelerated partial breast radiation. Would plan on delivering 3400 cGy over 10 fractions at 340 cGy twice a day through the MammoSite balloon catheter using I iridium 192. Risks and benefits of treatment including small chance of skin reaction fatigue permanent thickening of her lumpectomy scar and possibility that MammoSite catheter will not be suitable in which case we would switch to whole breast radiation all were discussed in detail with the patient and her daughter. They both seem to comprehend my treatment plan well. Will start making arrangements for balloon catheter placement following BrachyVision treatment planning. Patient also be a candidate for antiestrogen therapy after completion of treatment.  I would like to take this opportunity to thank you for allowing me to participate in the care of your patient.Armstead Peaks., MD

## 2016-09-11 ENCOUNTER — Telehealth: Payer: Self-pay | Admitting: *Deleted

## 2016-09-11 ENCOUNTER — Other Ambulatory Visit: Payer: Self-pay | Admitting: General Surgery

## 2016-09-11 MED ORDER — CEFADROXIL 500 MG PO CAPS: 500.0000 mg | ORAL_CAPSULE | Freq: Two times a day (BID) | ORAL | 0 refills | Status: DC

## 2016-09-11 NOTE — Progress Notes (Signed)
The patient has been found to be inadequate candidate for accelerated partial breast radiation.  We'll arrange for MammoSite balloon placement on Monday, July 2.  The patient will make use of Duricef prior to the procedure.

## 2016-09-11 NOTE — Telephone Encounter (Signed)
Mammosite schedule reviewed with the patient and daughter via interpreter language [phone]) Placement  09-15-16     at [facility] (form faxed to [facility]) Scan 09-18-16 Treat 6, 9-12 Aware the Mower will be calling her for more details Aware of ATB and directions reviewed. Aware no showers and to wear her bra while mammosite in place. Pt agrees.

## 2016-09-12 ENCOUNTER — Telehealth: Payer: Self-pay | Admitting: General Surgery

## 2016-09-12 NOTE — Telephone Encounter (Signed)
The patient's daughter called to confirm the scheduled dosing for her Duricef prescribed prior to planned MammoSite balloon placement. She will take her first dose about 1-1:15 tomorrow prior to presentation to the hospital for MammoSite placement around 2:00. After that she'll make use of a every 12 hour dosing schedule.

## 2016-09-13 NOTE — Discharge Summary (Signed)
No discharge summery required for outpatient procedure.

## 2016-09-15 ENCOUNTER — Ambulatory Visit
Admission: RE | Admit: 2016-09-15 | Discharge: 2016-09-15 | Disposition: A | Discharge status: Home / Self Care | Payer: Medicaid Other | Source: Ambulatory Visit | Attending: General Surgery | Admitting: General Surgery

## 2016-09-15 ENCOUNTER — Encounter: Payer: Self-pay | Admitting: *Deleted

## 2016-09-15 ENCOUNTER — Encounter
Admission: RE | Disposition: A | Discharge status: Home / Self Care | Payer: Self-pay | Source: Ambulatory Visit | Attending: General Surgery

## 2016-09-15 DIAGNOSIS — Z170 Estrogen receptor positive status [ER+]: Secondary | ICD-10-CM | POA: Insufficient documentation

## 2016-09-15 DIAGNOSIS — Z4589 Encounter for adjustment and management of other implanted devices: Secondary | ICD-10-CM | POA: Diagnosis present

## 2016-09-15 DIAGNOSIS — Z923 Personal history of irradiation: Secondary | ICD-10-CM

## 2016-09-15 DIAGNOSIS — C50911 Malignant neoplasm of unspecified site of right female breast: Secondary | ICD-10-CM | POA: Insufficient documentation

## 2016-09-15 DIAGNOSIS — C50411 Malignant neoplasm of upper-outer quadrant of right female breast: Secondary | ICD-10-CM | POA: Diagnosis not present

## 2016-09-15 HISTORY — PX: BREAST MAMMOSITE: SHX5264

## 2016-09-15 HISTORY — DX: Personal history of irradiation: Z92.3

## 2016-09-15 SURGERY — MAMMOSITE BREAST
Anesthesia: Choice | Laterality: Right | Wound class: Clean Contaminated

## 2016-09-15 MED ORDER — IOPAMIDOL (ISOVUE-M 200) INJECTION 41%
INTRAMUSCULAR | Status: DC | PRN
  Administered 2016-09-15: 10 mL

## 2016-09-15 MED ORDER — HYDROCODONE-ACETAMINOPHEN 5-325 MG PO TABS: 1.0000 | ORAL_TABLET | ORAL | 0 refills | Status: DC | PRN

## 2016-09-15 MED ORDER — SODIUM CHLORIDE 0.9 % IJ SOLN
INTRAMUSCULAR | Status: AC
  Filled 2016-09-15: qty 20

## 2016-09-15 MED ORDER — HYDROCODONE-ACETAMINOPHEN 5-325 MG PO TABS
1.0000 | ORAL_TABLET | ORAL | Status: DC | PRN
  Administered 2016-09-15: 1 via ORAL

## 2016-09-15 MED ORDER — SODIUM CHLORIDE 0.9 % IJ SOLN
INTRAMUSCULAR | Status: AC
  Filled 2016-09-15: qty 50

## 2016-09-15 MED ORDER — BACITRACIN ZINC 500 UNIT/GM EX OINT
TOPICAL_OINTMENT | CUTANEOUS | Status: DC | PRN
  Administered 2016-09-15: 1 via TOPICAL

## 2016-09-15 MED ORDER — LIDOCAINE-EPINEPHRINE (PF) 1 %-1:200000 IJ SOLN
INTRAMUSCULAR | Status: AC
  Filled 2016-09-15: qty 30

## 2016-09-15 MED ORDER — SODIUM CHLORIDE 0.9 % IJ SOLN
INTRAMUSCULAR | Status: DC | PRN
  Administered 2016-09-15: 35 mL

## 2016-09-15 MED ORDER — LIDOCAINE-EPINEPHRINE (PF) 1 %-1:200000 IJ SOLN
INTRAMUSCULAR | Status: DC | PRN
  Administered 2016-09-15: 20 mL

## 2016-09-15 MED ORDER — BACITRACIN ZINC 500 UNIT/GM EX OINT
TOPICAL_OINTMENT | CUTANEOUS | Status: AC
  Filled 2016-09-15: qty 28.35

## 2016-09-15 MED ORDER — HYDROCODONE-ACETAMINOPHEN 5-325 MG PO TABS
ORAL_TABLET | ORAL | Status: AC
  Filled 2016-09-15: qty 1

## 2016-09-15 SURGICAL SUPPLY — 38 items
BASIN GRAD PLASTIC 32OZ STRL (MISCELLANEOUS) IMPLANT
CANISTER SUCT 1200ML W/VALVE (MISCELLANEOUS) ×3 IMPLANT
CHLORAPREP W/TINT 26ML (MISCELLANEOUS) ×3 IMPLANT
CLOSURE WOUND 1/2 X4 (GAUZE/BANDAGES/DRESSINGS) ×1
CNTNR SPEC 2.5X3XGRAD LEK (MISCELLANEOUS)
CONT SPEC 4OZ STER OR WHT (MISCELLANEOUS)
CONTAINER SPEC 2.5X3XGRAD LEK (MISCELLANEOUS) IMPLANT
COVER PROBE FLX POLY STRL (MISCELLANEOUS) ×3 IMPLANT
DEVICE CAVITY EVALUATION 9031 (MISCELLANEOUS) ×3 IMPLANT
DEVICE DUBIN SPECIMEN MAMMOGRA (MISCELLANEOUS) IMPLANT
DRAPE LAPAROTOMY 100X77 ABD (DRAPES) ×3 IMPLANT
DRSG TELFA 4X3 1S NADH ST (GAUZE/BANDAGES/DRESSINGS) ×3 IMPLANT
ELECT REM PT RETURN 9FT ADLT (ELECTROSURGICAL) ×3
ELECTRODE REM PT RTRN 9FT ADLT (ELECTROSURGICAL) ×1 IMPLANT
GAUZE SPONGE 4X4 12PLY STRL (GAUZE/BANDAGES/DRESSINGS) ×3 IMPLANT
GLOVE BIO SURGEON STRL SZ7.5 (GLOVE) ×9 IMPLANT
GLOVE INDICATOR 8.0 STRL GRN (GLOVE) ×6 IMPLANT
GOWN STRL REUS W/ TWL LRG LVL3 (GOWN DISPOSABLE) ×2 IMPLANT
GOWN STRL REUS W/TWL LRG LVL3 (GOWN DISPOSABLE) ×4
KIT RM TURNOVER STRD PROC AR (KITS) ×3 IMPLANT
LABEL OR SOLS (LABEL) ×3 IMPLANT
MARGIN MAP 10MM (MISCELLANEOUS) IMPLANT
NDL SAFETY 22GX1.5 (NEEDLE) ×3 IMPLANT
NEEDLE HYPO 25GX1X1/2 BEV (NEEDLE) ×3 IMPLANT
NEEDLE HYPO 25X1 1.5 SAFETY (NEEDLE) ×3 IMPLANT
NS IRRIG 500ML POUR BTL (IV SOLUTION) ×3 IMPLANT
PACK BASIN MINOR ARMC (MISCELLANEOUS) ×3 IMPLANT
SHEARS HARMONIC 9CM CVD (BLADE) IMPLANT
STRIP CLOSURE SKIN 1/2X4 (GAUZE/BANDAGES/DRESSINGS) ×2 IMPLANT
SUT ETHILON 3-0 FS-10 30 BLK (SUTURE)
SUT VIC AB 2-0 CT1 27 (SUTURE)
SUT VIC AB 2-0 CT1 TAPERPNT 27 (SUTURE) IMPLANT
SUT VIC AB 4-0 FS2 27 (SUTURE) IMPLANT
SUTURE EHLN 3-0 FS-10 30 BLK (SUTURE) IMPLANT
SWABSTK COMLB BENZOIN TINCTURE (MISCELLANEOUS) IMPLANT
SYR CONTROL 10ML (SYRINGE) ×3 IMPLANT
TOWEL OR 17X26 4PK STRL BLUE (TOWEL DISPOSABLE) IMPLANT
TRAY MAMMOSITE APPLI 4 5 CM (KITS) ×3 IMPLANT

## 2016-09-15 NOTE — Op Note (Signed)
Preoperative diagnosis: Right breast cancer, candidate for accelerated partial breast radiation.  Postoperative diagnosis: Same.  Operative procedure: Placement right MammoSite balloon.  Operating surgeon: Ollen Bowl, M.D.  Anesthesia: Local, 1% Xylocaine with 1-200,000 epinephrine, 20 mL.  As no blood loss: Minimal.  Clinical note: This 61 year old recently diagnosed with a T1 cN0 ER/PR positive HER-2/neu negative carcinoma the right breast. She's been evaluated by radiation oncology and felt to be a candidate for accelerated partial breast radiation. The patient made use of Duricef 500 mg by mouth prior to presentation.  Operative note: The patient was placed currently spine on the operating table in the area was prepped with ChloraPrep and draped. Local anesthetic was infiltrated. Under ultrasound guidance the 8 mm trocar was inserted into the seroma cavity which drained approximately 10-15 mL of odorless serous fluid. The cavity evaluation device was inflated to 35 cm and this showed spherical insufflation and a measured distance of 1.03 cm to the overlying skin.  The treatment balloon was prepared and inflated with 70 mL of saline/Conray and showed spherical insufflation. The cavity evaluation device was removed and the treatment balloon placed. This was in inflated to 35 mL and measurement again showed a distance of 1.03 cm to the overlying skin. Ultrasound showed spherical insufflation.  Bacitracin ointment was applied to the drain next site followed by fluff gauze and tape.  The patient tolerated the procedure well was taken to day surgery in stable condition.

## 2016-09-15 NOTE — Progress Notes (Signed)
Dressing dry and intact   Dr  Bary Castilla into see

## 2016-09-15 NOTE — Progress Notes (Signed)
States feels like pressure as opposed to pain

## 2016-09-15 NOTE — H&P (Signed)
No change in clinical condition or history.  For right mammosite placement.

## 2016-09-16 ENCOUNTER — Encounter: Payer: Self-pay | Admitting: General Surgery

## 2016-09-16 ENCOUNTER — Ambulatory Visit: Payer: PRIVATE HEALTH INSURANCE | Admitting: General Surgery

## 2016-09-18 ENCOUNTER — Ambulatory Visit
Admission: RE | Admit: 2016-09-18 | Discharge: 2016-09-18 | Disposition: A | Discharge status: Home / Self Care | Payer: Medicaid Other | Source: Ambulatory Visit | Attending: Radiation Oncology | Admitting: Radiation Oncology

## 2016-09-18 ENCOUNTER — Encounter: Payer: Self-pay | Admitting: Oncology

## 2016-09-18 DIAGNOSIS — Z510 Encounter for antineoplastic radiation therapy: Secondary | ICD-10-CM | POA: Diagnosis not present

## 2016-09-19 ENCOUNTER — Encounter: Payer: Self-pay | Admitting: Oncology

## 2016-09-19 ENCOUNTER — Ambulatory Visit
Admission: RE | Admit: 2016-09-19 | Discharge: 2016-09-19 | Disposition: A | Discharge status: Home / Self Care | Payer: Medicaid Other | Source: Ambulatory Visit | Attending: Radiation Oncology | Admitting: Radiation Oncology

## 2016-09-19 DIAGNOSIS — Z510 Encounter for antineoplastic radiation therapy: Secondary | ICD-10-CM | POA: Diagnosis not present

## 2016-09-22 ENCOUNTER — Ambulatory Visit
Admission: RE | Admit: 2016-09-22 | Discharge: 2016-09-22 | Disposition: A | Discharge status: Home / Self Care | Payer: Medicaid Other | Source: Ambulatory Visit | Attending: Radiation Oncology | Admitting: Radiation Oncology

## 2016-09-22 DIAGNOSIS — Z510 Encounter for antineoplastic radiation therapy: Secondary | ICD-10-CM | POA: Diagnosis not present

## 2016-09-23 ENCOUNTER — Ambulatory Visit
Admission: RE | Admit: 2016-09-23 | Discharge: 2016-09-23 | Disposition: A | Discharge status: Home / Self Care | Payer: Medicaid Other | Source: Ambulatory Visit | Attending: Radiation Oncology | Admitting: Radiation Oncology

## 2016-09-23 DIAGNOSIS — Z510 Encounter for antineoplastic radiation therapy: Secondary | ICD-10-CM | POA: Diagnosis not present

## 2016-09-24 ENCOUNTER — Ambulatory Visit
Admission: RE | Admit: 2016-09-24 | Discharge: 2016-09-24 | Disposition: A | Discharge status: Home / Self Care | Payer: Medicaid Other | Source: Ambulatory Visit | Attending: Radiation Oncology | Admitting: Radiation Oncology

## 2016-09-24 DIAGNOSIS — C50411 Malignant neoplasm of upper-outer quadrant of right female breast: Secondary | ICD-10-CM | POA: Diagnosis not present

## 2016-09-24 DIAGNOSIS — Z510 Encounter for antineoplastic radiation therapy: Secondary | ICD-10-CM | POA: Insufficient documentation

## 2016-09-24 DIAGNOSIS — Z170 Estrogen receptor positive status [ER+]: Secondary | ICD-10-CM | POA: Diagnosis not present

## 2016-09-25 ENCOUNTER — Ambulatory Visit
Admission: RE | Admit: 2016-09-25 | Discharge: 2016-09-25 | Disposition: A | Discharge status: Home / Self Care | Payer: Medicaid Other | Source: Ambulatory Visit | Attending: Radiation Oncology | Admitting: Radiation Oncology

## 2016-09-25 ENCOUNTER — Telehealth: Payer: Self-pay | Admitting: *Deleted

## 2016-09-25 DIAGNOSIS — Z510 Encounter for antineoplastic radiation therapy: Secondary | ICD-10-CM | POA: Diagnosis not present

## 2016-09-25 NOTE — Telephone Encounter (Signed)
Called patient via Wanda Reed, the interpreter.  Changed patients appointment with Dr. Grayland Ormond to July 18th, 2018 @ 3:30.  Also spoke with her daughter per her request.

## 2016-10-01 ENCOUNTER — Inpatient Hospital Stay: Payer: Medicaid Other | Attending: Oncology | Admitting: Oncology

## 2016-10-01 VITALS — BP 149/76 | HR 80 | Temp 97.9°F | Resp 20 | Wt 160.0 lb

## 2016-10-01 DIAGNOSIS — E079 Disorder of thyroid, unspecified: Secondary | ICD-10-CM | POA: Diagnosis not present

## 2016-10-01 DIAGNOSIS — F419 Anxiety disorder, unspecified: Secondary | ICD-10-CM | POA: Insufficient documentation

## 2016-10-01 DIAGNOSIS — C50411 Malignant neoplasm of upper-outer quadrant of right female breast: Secondary | ICD-10-CM | POA: Diagnosis not present

## 2016-10-01 DIAGNOSIS — Z79899 Other long term (current) drug therapy: Secondary | ICD-10-CM | POA: Diagnosis not present

## 2016-10-01 DIAGNOSIS — I10 Essential (primary) hypertension: Secondary | ICD-10-CM

## 2016-10-01 DIAGNOSIS — Z170 Estrogen receptor positive status [ER+]: Secondary | ICD-10-CM | POA: Diagnosis not present

## 2016-10-01 NOTE — Progress Notes (Signed)
Patient denies any concerns today.  

## 2016-10-01 NOTE — Progress Notes (Signed)
Wanda Reed  [phone]) [phone] [phone]) [phone]  ID: Alexei Ey OB: [birth year]  MR#: [id number]  SAY#:[id number]  Patient Care Team: Denton Lank, MD as PCP - General (Family Medicine) Rico Junker, RN as Registered Nurse Theodore Demark, RN as Registered Nurse Bary Castilla Forest Gleason, MD (General Surgery)  CHIEF COMPLAINT: Pathologic stage IA ER positive, PR/HER-2 negative, invasive carcinoma of the upper outer quadrant of the right breast. High risk MammaPrint.  INTERVAL HISTORY: Patient returns to clinic today for further evaluation and treatment planning. She recently completed MammoSite therapy. She was noted to have a high risk MammaPrint. She is anxious, but otherwise feels well. She has no neurologic complaints. She denies any recent fevers or illnesses. She has a good appetite and denies weight loss. She has no chest pain or shortness of breath. She denies any nausea, vomiting, constipation, or diarrhea. She has no urinary complaints. Patient offers no further specific complaints today.  REVIEW OF SYSTEMS:   Review of Systems  Constitutional: Negative.  Negative for fever, malaise/fatigue and weight loss.  Respiratory: Negative.  Negative for cough and shortness of breath.   Cardiovascular: Negative.  Negative for chest pain and leg swelling.  Gastrointestinal: Negative.  Negative for abdominal pain.  Genitourinary: Negative.   Musculoskeletal: Negative.   Skin: Negative.  Negative for rash.  Neurological: Negative.  Negative for weakness.  Psychiatric/Behavioral: The patient is nervous/anxious.     As per HPI. Otherwise, a complete review of systems is negative.  PAST MEDICAL HISTORY: Past Medical History:  Diagnosis Date  . Anxiety   . Breast cancer of upper-outer quadrant of right female breast ([place]) 08/2016   1.0 cm ER+, PR-, Her 2 neu not overexpressed. Node negative. T1b, N0.  [name] Cancer (HCC)    cervical  . Hypertension   . Thyroid  disease     PAST SURGICAL HISTORY: Past Surgical History:  Procedure Laterality Date  . ABDOMINAL HYSTERECTOMY    . BREAST LUMPECTOMY Right 08/28/2016  . BREAST LUMPECTOMY WITH SENTINEL LYMPH NODE BIOPSY Right 08/28/2016   Procedure: BREAST LUMPECTOMY WITH SENTINEL LYMPH NODE BX;  Surgeon: [name], MD;  Location: [facility] ORS;  Service: General;  Laterality: Right;  . BREAST MAMMOSITE Right 09/15/2016   Procedure: MAMMOSITE BREAST;  Surgeon: [name], MD;  Location: [facility] ORS;  Service: General;  Laterality: Right;  . BREAST SURGERY Right    Breast Biopsy  . CERVIX SURGERY    . COLONOSCOPY  2008  . EYE SURGERY Bilateral    Cataract Extraction with IOL  . SHOULDER ARTHROSCOPY Right     FAMILY HISTORY: Family History  Problem Relation Age of Onset  . Breast cancer Neg Hx     ADVANCED DIRECTIVES (Y/N):  N  HEALTH MAINTENANCE: Social History  Substance Use Topics  . Smoking status: Never Smoker  . Smokeless tobacco: Never Used  . Alcohol use No     Colonoscopy:  PAP:  Bone density:  Lipid panel:  No Known Allergies  Current Outpatient Prescriptions  Medication Sig Dispense Refill  . acetaminophen (TYLENOL) 500 MG tablet Take 1,000 mg by mouth every 6 (six) hours as needed (for pain.).    [name] acyclovir (ZOVIRAX) 400 MG tablet Take 400 mg by mouth 3 (three) times daily as needed (cold sore outbreaks).     . hydrochlorothiazide (HYDRODIURIL) 25 MG tablet Take 25 mg by mouth daily.    [name] levothyroxine (SYNTHROID, LEVOTHROID) 100 MCG tablet Take 100 mcg by mouth daily before  breakfast.    . lisinopril (PRINIVIL,ZESTRIL) 10 MG tablet Take 10 mg by mouth daily.    . cefadroxil (DURICEF) 500 MG capsule Take 1 capsule (500 mg total) by mouth 2 (two) times daily. Start taking this medication when she leaves for the hospital on Monday 09-15-16 (Patient not taking: Reported on 10/01/2016) 22 capsule 0  . HYDROcodone-acetaminophen (NORCO) 5-325 MG tablet Take 1-2 tablets by  mouth every 4 (four) hours as needed for moderate pain. (Patient not taking: Reported on 10/01/2016) 30 tablet 0   No current facility-administered medications for this visit.     OBJECTIVE: Vitals:   10/01/16 1554  BP: (!) 149/76  Pulse: 80  Resp: 20  Temp: 97.9 F (36.6 C)     Body mass index is 30.23 kg/m.    ECOG FS:0 - Asymptomatic  General: Well-developed, well-nourished, no acute distress. Eyes: Pink conjunctiva, anicteric sclera. Breasts: Exam deferred today. Lungs: Clear to auscultation bilaterally. Heart: Regular rate and rhythm. No rubs, murmurs, or gallops. Abdomen: Soft, nontender, nondistended. No organomegaly noted, normoactive bowel sounds. Musculoskeletal: No edema, cyanosis, or clubbing. Neuro: Alert, answering all questions appropriately. Cranial nerves grossly intact. Skin: No rashes or petechiae noted. Psych: Normal affect.  LAB RESULTS:  Lab Results  Component Value Date   NA 130 (L) 08/29/2016   K 4.0 08/29/2016   CL 98 (L) 08/29/2016   CO2 27 08/29/2016   GLUCOSE 122 (H) 08/29/2016   BUN 18 08/29/2016   CREATININE 0.72 08/29/2016   CALCIUM 8.8 (L) 08/29/2016   GFRNONAA >60 08/29/2016   GFRAA >60 08/29/2016    Lab Results  Component Value Date   HGB 13.3 08/28/2016   HCT 39.0 08/28/2016     STUDIES: No results found.  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 High risk MammaPrint  PLAN:    1. 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 High risk MammaPrint: Patient had lumpectomy on August 28, 2016 followed by MammoSite radiation therapy. Given her high risk MammaPrint, have recommended adjuvant chemotherapy. Patient will return to clinic on October 22, 2016 to initiate cycle 1 of 4 of Taxotere and Cytoxan. (Patient has to return to [place] for immigration purposes prior to  treatment). She will require Neulasta support. She does not require port at this time, but will consider one in the future if IV access becomes difficult. At the conclusion of all her treatments, patient will benefit from an aromatase inhibitor given the ER status of her tumor.  Approximately 30 minutes was spent in discussion of which greater than 50% was consultation.  Patient expressed understanding and was in agreement with this plan. She also understands that She can call clinic at any time with any questions, concerns, or complaints.   Cancer Staging Malignant neoplasm of upper-outer quadrant of right breast in female, estrogen receptor positive ([place]) Staging form: Breast, AJCC 8th Edition - Pathologic stage from 10/01/2016: Stage IA (pT1b, pN0, cM0, G2, ER: Positive, PR: Negative, HER2: Negative) - Signed by Lloyd Huger, MD on 10/01/2016   Lloyd Huger, MD   10/05/2016 2:33 PM

## 2016-10-05 MED ORDER — PROCHLORPERAZINE MALEATE 10 MG PO TABS: 10.0000 mg | ORAL_TABLET | Freq: Four times a day (QID) | ORAL | 2 refills | Status: DC | PRN

## 2016-10-05 MED ORDER — ONDANSETRON HCL 8 MG PO TABS: 8.0000 mg | ORAL_TABLET | Freq: Two times a day (BID) | ORAL | 2 refills | Status: DC | PRN

## 2016-10-05 NOTE — Progress Notes (Signed)
START ON PATHWAY REGIMEN - Breast     A cycle is every 21 days:     Docetaxel      Cyclophosphamide   **Always confirm dose/schedule in your pharmacy ordering system**    Patient Characteristics: Postoperative without Neoadjuvant Therapy (Pathologic Staging), Invasive Disease, Adjuvant Therapy, Node Negative, HER2 Negative/Unknown/Equivocal, ER Positive, MammaPrint(R), High Genomic Risk Therapeutic Status: Postoperative without Neoadjuvant Therapy (Pathologic Staging) AJCC Grade: G2 AJCC N Category: pN0 AJCC M Category: cM0 ER Status: Positive (+) AJCC 8 Stage Grouping: IA HER2 Status: Negative (-) Oncotype Dx Recurrence Score: Ordered Other Genomic Test AJCC T Category: pT1b PR Status: Negative (-) Has this patient completed genomic testing? Yes - MammaPrint(R) MammaPrint(R) Score: High Genomic Risk Intent of Therapy: Curative Intent, Discussed with Patient

## 2016-10-07 ENCOUNTER — Ambulatory Visit (INDEPENDENT_AMBULATORY_CARE_PROVIDER_SITE_OTHER): Payer: Medicaid Other | Admitting: General Surgery

## 2016-10-07 ENCOUNTER — Other Ambulatory Visit: Payer: Medicaid Other

## 2016-10-07 ENCOUNTER — Encounter: Payer: Self-pay | Admitting: General Surgery

## 2016-10-07 VITALS — BP 122/66 | HR 88 | Resp 12 | Ht 61.0 in | Wt 158.0 lb

## 2016-10-07 DIAGNOSIS — C50411 Malignant neoplasm of upper-outer quadrant of right female breast: Secondary | ICD-10-CM

## 2016-10-07 DIAGNOSIS — Z170 Estrogen receptor positive status [ER+]: Secondary | ICD-10-CM

## 2016-10-07 NOTE — Patient Instructions (Signed)
Docetaxel injection What is this medicine? DOCETAXEL (doe se TAX el) is a chemotherapy drug. It targets fast dividing cells, like cancer cells, and causes these cells to die. This medicine is used to treat many types of cancers like breast cancer, certain stomach cancers, head and neck cancer, lung cancer, and prostate cancer. This medicine may be used for other purposes; ask your health care provider or pharmacist if you have questions. COMMON BRAND NAME(S): Docefrez, Taxotere What should I tell my health care provider before I take this medicine? They need to know if you have any of these conditions: -infection (especially a virus infection such as chickenpox, cold sores, or herpes) -liver disease -low blood counts, like low white cell, platelet, or red cell counts -an unusual or allergic reaction to docetaxel, polysorbate 80, other chemotherapy agents, other medicines, foods, dyes, or preservatives -pregnant or trying to get pregnant -breast-feeding How should I use this medicine? This drug is given as an infusion into a vein. It is administered in a hospital or clinic by a specially trained health care professional. Talk to your pediatrician regarding the use of this medicine in children. Special care may be needed. Overdosage: If you think you have taken too much of this medicine contact a poison control center or emergency room at once. NOTE: This medicine is only for you. Do not share this medicine with others. What if I miss a dose? It is important not to miss your dose. Call your doctor or health care professional if you are unable to keep an appointment. What may interact with this medicine? -cyclosporine -erythromycin -ketoconazole -medicines to increase blood counts like filgrastim, pegfilgrastim, sargramostim -vaccines Talk to your doctor or health care professional before taking any of these medicines: -acetaminophen -aspirin -ibuprofen -ketoprofen -naproxen This list  may not describe all possible interactions. Give your health care provider a list of all the medicines, herbs, non-prescription drugs, or dietary supplements you use. Also tell them if you smoke, drink alcohol, or use illegal drugs. Some items may interact with your medicine. What should I watch for while using this medicine? Your condition will be monitored carefully while you are receiving this medicine. You will need important blood work done while you are taking this medicine. This drug may make you feel generally unwell. This is not uncommon, as chemotherapy can affect healthy cells as well as cancer cells. Report any side effects. Continue your course of treatment even though you feel ill unless your doctor tells you to stop. In some cases, you may be given additional medicines to help with side effects. Follow all directions for their use. Call your doctor or health care professional for advice if you get a fever, chills or sore throat, or other symptoms of a cold or flu. Do not treat yourself. This drug decreases your body's ability to fight infections. Try to avoid being around people who are sick. This medicine may increase your risk to bruise or bleed. Call your doctor or health care professional if you notice any unusual bleeding. This medicine may contain alcohol in the product. You may get drowsy or dizzy. Do not drive, use machinery, or do anything that needs mental alertness until you know how this medicine affects you. Do not stand or sit up quickly, especially if you are an older patient. This reduces the risk of dizzy or fainting spells. Avoid alcoholic drinks. Do not become pregnant while taking this medicine. Women should inform their doctor if they wish to become pregnant or   think they might be pregnant. There is a potential for serious side effects to an unborn child. Talk to your health care professional or pharmacist for more information. Do not breast-feed an infant while taking  this medicine. What side effects may I notice from receiving this medicine? Side effects that you should report to your doctor or health care professional as soon as possible: -allergic reactions like skin rash, itching or hives, swelling of the face, lips, or tongue -low blood counts - This drug may decrease the number of white blood cells, red blood cells and platelets. You may be at increased risk for infections and bleeding. -signs of infection - fever or chills, cough, sore throat, pain or difficulty passing urine -signs of decreased platelets or bleeding - bruising, pinpoint red spots on the skin, black, tarry stools, nosebleeds -signs of decreased red blood cells - unusually weak or tired, fainting spells, lightheadedness -breathing problems -fast or irregular heartbeat -low blood pressure -mouth sores -nausea and vomiting -pain, swelling, redness or irritation at the injection site -pain, tingling, numbness in the hands or feet -swelling of the ankle, feet, hands -weight gain Side effects that usually do not require medical attention (report to your doctor or health care professional if they continue or are bothersome): -bone pain -complete hair loss including hair on your head, underarms, pubic hair, eyebrows, and eyelashes -diarrhea -excessive tearing -changes in the color of fingernails -loosening of the fingernails -nausea -muscle pain -red flush to skin -sweating -weak or tired This list may not describe all possible side effects. Call your doctor for medical advice about side effects. You may report side effects to FDA at 1-800-FDA-1088. Where should I keep my medicine? This drug is given in a hospital or clinic and will not be stored at home. NOTE: This sheet is a summary. It may not cover all possible information. If you have questions about this medicine, talk to your doctor, pharmacist, or health care provider.  2018 Elsevier/Gold Standard (2015-04-05  12:32:56) Cyclophosphamide injection What is this medicine? CYCLOPHOSPHAMIDE (sye kloe FOSS fa mide) is a chemotherapy drug. It slows the growth of cancer cells. This medicine is used to treat many types of cancer like lymphoma, myeloma, leukemia, breast cancer, and ovarian cancer, to name a few. This medicine may be used for other purposes; ask your health care provider or pharmacist if you have questions. COMMON BRAND NAME(S): Cytoxan, Neosar What should I tell my health care provider before I take this medicine? They need to know if you have any of these conditions: -blood disorders -history of other chemotherapy -infection -kidney disease -liver disease -recent or ongoing radiation therapy -tumors in the bone marrow -an unusual or allergic reaction to cyclophosphamide, other chemotherapy, other medicines, foods, dyes, or preservatives -pregnant or trying to get pregnant -breast-feeding How should I use this medicine? This drug is usually given as an injection into a vein or muscle or by infusion into a vein. It is administered in a hospital or clinic by a specially trained health care professional. Talk to your pediatrician regarding the use of this medicine in children. Special care may be needed. Overdosage: If you think you have taken too much of this medicine contact a poison control center or emergency room at once. NOTE: This medicine is only for you. Do not share this medicine with others. What if I miss a dose? It is important not to miss your dose. Call your doctor or health care professional if you are unable to keep   an appointment. What may interact with this medicine? This medicine may interact with the following medications: -amiodarone -amphotericin B -azathioprine -certain antiviral medicines for HIV or AIDS such as protease inhibitors (e.g., indinavir, ritonavir) and zidovudine -certain blood pressure medications such as benazepril, captopril, enalapril, fosinopril,  lisinopril, moexipril, monopril, perindopril, quinapril, ramipril, trandolapril -certain cancer medications such as anthracyclines (e.g., daunorubicin, doxorubicin), busulfan, cytarabine, paclitaxel, pentostatin, tamoxifen, trastuzumab -certain diuretics such as chlorothiazide, chlorthalidone, hydrochlorothiazide, indapamide, metolazone -certain medicines that treat or prevent blood clots like warfarin -certain muscle relaxants such as succinylcholine -cyclosporine -etanercept -indomethacin -medicines to increase blood counts like filgrastim, pegfilgrastim, sargramostim -medicines used as general anesthesia -metronidazole -natalizumab This list may not describe all possible interactions. Give your health care provider a list of all the medicines, herbs, non-prescription drugs, or dietary supplements you use. Also tell them if you smoke, drink alcohol, or use illegal drugs. Some items may interact with your medicine. What should I watch for while using this medicine? Visit your doctor for checks on your progress. This drug may make you feel generally unwell. This is not uncommon, as chemotherapy can affect healthy cells as well as cancer cells. Report any side effects. Continue your course of treatment even though you feel ill unless your doctor tells you to stop. Drink water or other fluids as directed. Urinate often, even at night. In some cases, you may be given additional medicines to help with side effects. Follow all directions for their use. Call your doctor or health care professional for advice if you get a fever, chills or sore throat, or other symptoms of a cold or flu. Do not treat yourself. This drug decreases your body's ability to fight infections. Try to avoid being around people who are sick. This medicine may increase your risk to bruise or bleed. Call your doctor or health care professional if you notice any unusual bleeding. Be careful brushing and flossing your teeth or using a  toothpick because you may get an infection or bleed more easily. If you have any dental work done, tell your dentist you are receiving this medicine. You may get drowsy or dizzy. Do not drive, use machinery, or do anything that needs mental alertness until you know how this medicine affects you. Do not become pregnant while taking this medicine or for 1 year after stopping it. Women should inform their doctor if they wish to become pregnant or think they might be pregnant. Men should not father a child while taking this medicine and for 4 months after stopping it. There is a potential for serious side effects to an unborn child. Talk to your health care professional or pharmacist for more information. Do not breast-feed an infant while taking this medicine. This medicine may interfere with the ability to have a child. This medicine has caused ovarian failure in some women. This medicine has caused reduced sperm counts in some men. You should talk with your doctor or health care professional if you are concerned about your fertility. If you are going to have surgery, tell your doctor or health care professional that you have taken this medicine. What side effects may I notice from receiving this medicine? Side effects that you should report to your doctor or health care professional as soon as possible: -allergic reactions like skin rash, itching or hives, swelling of the face, lips, or tongue -low blood counts - this medicine may decrease the number of white blood cells, red blood cells and platelets. You may be at increased   risk for infections and bleeding. -signs of infection - fever or chills, cough, sore throat, pain or difficulty passing urine -signs of decreased platelets or bleeding - bruising, pinpoint red spots on the skin, black, tarry stools, blood in the urine -signs of decreased red blood cells - unusually weak or tired, fainting spells, lightheadedness -breathing problems -dark  urine -dizziness -palpitations -swelling of the ankles, feet, hands -trouble passing urine or change in the amount of urine -weight gain -yellowing of the eyes or skin Side effects that usually do not require medical attention (report to your doctor or health care professional if they continue or are bothersome): -changes in nail or skin color -hair loss -missed menstrual periods -mouth sores -nausea, vomiting This list may not describe all possible side effects. Call your doctor for medical advice about side effects. You may report side effects to FDA at 1-800-FDA-1088. Where should I keep my medicine? This drug is given in a hospital or clinic and will not be stored at home. NOTE: This sheet is a summary. It may not cover all possible information. If you have questions about this medicine, talk to your doctor, pharmacist, or health care provider.  2018 Elsevier/Gold Standard (2012-01-16 16:22:58) Docetaxel injection Qu es este medicamento? El DOCETAXEL es un agente quimioteraputico. Este medicamento acta sobre las clulas que se dividen rpidamente, como las clulas cancergenas, y finalmente provoca la muerte de estas clulas. Se utiliza en el tratamiento de muchos tipos de cncer como el cncer de mama, adenocarcinoma gstrico, cabeza y cuello, pulmn y prstata. Este medicamento puede ser utilizado para otros usos; si tiene alguna pregunta consulte con su proveedor de atencin mdica o con su farmacutico. MARCAS COMUNES: Docefrez, Taxotere Qu le debo informar a mi profesional de la salud antes de tomar este medicamento? Necesita saber si usted presenta alguno de los siguientes problemas o situaciones: -infeccin (especialmente infecciones virales, como varicela o herpes) -enfermedad heptica -conteos sanguneos bajos, como baja cantidad de glbulos blancos, glbulos rojos y plaquetas -una reaccin alrgica o inusual al docetaxel, polisorbato 80, a otros agentes  quimioteraputicos, a otros medicamentos, alimentos, colorantes o conservantes -si est embarazada o buscando quedar embarazada -si est amamantando a un beb Cmo debo utilizar este medicamento? Este medicamento se administra como infusin en una vena. Un profesional de la salud especialmente capacitado lo administra en un hospital o clnica. Hable con su pediatra para informarse acerca del uso de este medicamento en nios. Puede requerir atencin especial. Sobredosis: Pngase en contacto inmediatamente con un centro toxicolgico o una sala de urgencia si usted cree que haya tomado demasiado medicamento. ATENCIN: [employer] es solo para usted. No comparta este medicamento con nadie. Qu sucede si me olvido de una dosis? Es importante no olvidar ninguna dosis. Informe a su mdico o a su profesional de la salud si no puede asistir a Photographer. Qu puede interactuar con este medicamento? -ciclosporina -eritromicina -quetoconazol -medicamentos para incrementar los conteos sanguneos, tales como filgrastim, pegfilgrastim, sargramostim -vacunas Consulte a su mdico o a su profesional de la salud antes de tomar cualquiera de los siguientes medicamentos: -acetaminofeno -aspirina -ibuprofeno -quetoprofeno -naproxeno Puede ser que esta lista no menciona todas las posibles interacciones. Informe a su profesional de [employer]	[place] de [employer] productos a base de hierbas, medicamentos de [place] o suplementos nutritivos que est tomando. Si usted fuma, consume bebidas alcohlicas o si utiliza drogas ilegales, indqueselo tambin a su profesional de [employer]	[place]. Algunas sustancias pueden interactuar con su medicamento. A qu debo estar  atento al usar [employer]? Se supervisar su estado de salud atentamente mientras reciba este medicamento. Tendr que hacerse anlisis de sangre importantes mientras est tomando este medicamento. Este medicamento puede hacerle sentir un Nurse, mental health. Esto  es normal ya que la quimioterapia afecta tanto a las clulas sanas como a las clulas cancerosas. Si presenta alguno de los [employer], infrmelos. Sin embargo, contine con el tratamiento aun si se siente enfermo, a menos que su mdico le indique que lo suspenda. En algunos casos, podr recibir [employer] para ayudarlo con los efectos secundarios. Siga las instrucciones para usarlos. Consulte a su mdico o a su profesional de la salud si tiene fiebre, escalofros, dolor de garganta o cualquier otro sntoma de resfro o gripe. No se trate usted mismo. Este medicamento puede reducir la capacidad del cuerpo para combatir infecciones. Trate de no acercarse a personas que estn enfermas. [employer] puede aumentar el riesgo de magulladuras o sangrado. Consulte a su mdico o a su profesional de la salud si observa sangrados inusuales. Este medicamento puede contener alcohol en el producto. Puede experimentar mareos o somnolencia. No conduzca ni utilice maquinaria ni haga nada que Associate Professor en estado de alerta hasta que sepa cmo le afecta este medicamento. No se siente ni se ponga de pie con rapidez, especialmente si es un paciente de edad avanzada. Esto reduce el riesgo de mareos o [employer]. Evite consumir bebidas alcohlicas. No debe quedar embarazada mientras recibe este medicamento. Las mujeres deben informar a su mdico si estn buscando quedar embarazadas o si creen que estn embarazadas. Existe la posibilidad de que ocurran efectos secundarios graves a un beb sin nacer. Para ms informacin hable con su profesional de la salud o su farmacutico. No debe amamantar a un beb mientras est tomando este medicamento. Qu efectos secundarios puedo tener al [employer] este medicamento? Efectos secundarios que debe informar a su mdico o a Barrister's clerk de la salud tan pronto como sea posible: Chief of Staff, como erupcin cutnea, picazn o urticarias, e hinchazn de la  cara, los labios o la lengua conteos sanguneos bajos: este frmaco podra reducir la cantidad de glbulos blancos, glbulos rojos y plaquetas. Su riesgo de infeccin y sangrado podra ser mayor. signos de infeccin: fiebre o escalofros, tos, dolor de garganta, dolor o dificultad para orinar signos de disminucin en la cantidad de plaquetas o sangrado: moretones, puntos rojos en la piel, heces de color negro y aspecto alquitranado, sangrado por la nariz signos de disminucin en la cantidad de glbulos rojos: cansancio o debilidad inusual, desmayos, aturdimiento problemas respiratorios ritmo cardiaco rpido o irregular baja presin sangunea llagas en la boca nuseas y vmito dolor, hinchazn, enrojecimiento o Actor de Air cabin crew, hormigueo o entumecimiento de las manos o los pies hinchazn de tobillos, pies, manos aumento de peso Efectos secundarios que generalmente no requieren atencin mdica (infrmelos a su mdico o a su profesional de la salud si persisten o si son molestos): dolor de huesos cada total del cabello, incluyendo el cabello de la cabeza, de las [place], el vello pbico, las cejas y las pestaas diarrea lagrimeo excesivo cambios en el color de las uas de las manos aflojamiento de las uas de las manos nuseas dolor muscular enrojecimiento de la piel sudoracin debilidad o cansancio Puede ser que esta lista no menciona todos los posibles efectos secundarios. Comunquese a su mdico por asesoramiento mdico [employer] Usted puede informar los efectos secundarios a la FDA por telfono al  1-800-FDA-1088. Dnde debo guardar mi medicina? Este medicamento se administra en hospitales o clnicas y no necesitar guardarlo en su domicilio. ATENCIN: Este folleto es un resumen. Puede ser que no cubra toda la posible informacin. Si usted tiene preguntas acerca de esta medicina, consulte con su mdico, su farmacutico o su profesional de Technical sales engineer.  2018  Elsevier/Gold Standard (2016-04-03 00:00:00) Cyclophosphamide injection Qu es este medicamento? La CICLOFOSFAMIDA es un agente quimioteraputico. Este medicamento reduce el crecimiento de las clulas cancerosas. Este medicamento se [place] en el tratamiento de varios tipos de cncer como linfoma, mieloma, leucemia, cncer de mama y cncer de ovarios, por ejemplo. Este medicamento puede ser utilizado para otros usos; si tiene alguna pregunta consulte con su proveedor de atencin mdica o con su farmacutico. MARCAS COMUNES: Cytoxan, Neosar Qu le debo informar a mi profesional de la salud antes de tomar este medicamento? Necesita saber si usted presenta alguno de los siguientes problemas o situaciones: -trastorno sanguneo -antecedentes de quimioterapia -infeccin -enfermedad renal -enfermedad heptica -radioterapia reciente o en curso -tumors en la mdula sea -una reaccin alrgica o inusual a la ciclofosfamida, a otros agentes quimioteraputicos, a otros medicamentos, alimentos, colorantes o conservantes -si est embarazada o buscando quedar embarazada -si est amamantando a un beb Cmo debo utilizar este medicamento? Este frmaco se administra generalmente mediante inyeccin en una vena o un msculo, o mediante infusin en una vena. Un profesional de la salud especialmente capacitado lo administra en un hospital o clnica. Hable con su pediatra para informarse acerca del uso de este medicamento en nios. Puede requerir atencin especial. Sobredosis: Pngase en contacto inmediatamente con un centro toxicolgico o una sala de urgencia si usted cree que haya tomado demasiado medicamento. ATENCIN: [employer] es solo para usted. No comparta este medicamento con nadie. Qu sucede si me olvido de una dosis? Es importante no olvidar ninguna dosis. Informe a su mdico o a su profesional de la salud si no puede asistir a Photographer. Qu puede interactuar con este medicamento? Esta  medicina puede interactuar con los siguientes medicamentos: -amiodarona -anfotericina B -azatioprina -ciertos medicamentos antivricos para el VIH o SIDA, tales como inhibidores de la proteasa (indinavir, ritonavir) y zidovudina -ciertos medicamentos para la presin sangunea, tales como benazepril, captopril, enalapril, fosinopril, lisinopril, moexipril, monopril, perindopril, quinapril, ramipril, trandolapril -ciertos medicamentos para el cncer tales como antraciclnicos (daunorrubicina, doxorrubicina), busulfn, citarabina, paclitaxel, pentostatina, tamoxifeno, trastuzumab -ciertos diurticos, tales como clorotiazida, clortalidona, hidroclorotiazida, indapamida, metolazona -ciertos medicamentos que tratan o previenen cogulos sanguneos, como warfarina -ciertos relajantes musculares, tales como succinilcolina -ciclosporina -etanercept -indometacina -medicamentos para incrementar los conteos sanguneos, tales como filgrastim, pegfilgrastim, sargramostim -medicamentos utilizados para la anestesia general -metronidazol -natalizumab Puede ser que esta lista no menciona todas las posibles interacciones. Informe a su profesional de [employer]	[place] de [employer] productos a base de hierbas, medicamentos de [place] o suplementos nutritivos que est tomando. Si usted fuma, consume bebidas alcohlicas o si utiliza drogas ilegales, indqueselo tambin a su profesional de [employer]	[place]. Algunas sustancias pueden interactuar con su medicamento. A qu debo estar atento al usar [employer]? Visite a su mdico para que revise su evolucin. Este medicamento podra hacerle sentir un Nurse, mental health. Esto es normal ya que la quimioterapia puede Print production planner tanto a las clulas sanas como a las clulas cancerosas. Si presenta algn efecto secundario, infrmelo. Contine con el tratamiento aun si se siente enfermo, a menos que su mdico le indique que lo suspenda. Beba agua u otros lquidos segn lo indicado. Orine con  frecuencia, incluso de noche. En algunos casos, podra recibir [employer] para ayudarlo con los efectos secundarios. Siga todas las instrucciones para usarlos. Consulte a su mdico o a su profesional de la salud si tiene fiebre, escalofros o dolor de garganta, o cualquier otro sntoma de resfro o gripe. No se trate usted mismo. Este medicamento reduce la capacidad del cuerpo para combatir infecciones. Trate de no acercarse a personas que estn enfermas. Este medicamento podra aumentar el riesgo de moretones o sangrado. Consulte a su mdico o a su profesional de la salud si observa sangrados inusuales. Proceda con cuidado al cepillar sus dientes, usar hilo dental o Risk manager palillos para los dientes, ya que podra contraer una infeccin o Therapist, art con mayor facilidad. Si se somete a algn tratamiento dental, informe a su dentista que est [employer]. Puede experimentar somnolencia o mareos. No conduzca, no utilice maquinaria ni haga nada que Associate Professor en estado de alerta hasta que sepa cmo le afecta este medicamento. No debe quedar embarazada mientras est tomando este medicamento o por 1 ao despus de dejar de usarlo. Las mujeres deben informar a su mdico si estn buscando quedar embarazadas o si creen que podran estar embarazadas. Los hombres no deben tener hijos mientras estn recibiendo [employer] y [place] 4 meses despus de dejar de usarlo. Existe la posibilidad de efectos secundarios graves en un beb sin nacer. Para obtener ms informacin, hable con su profesional de la salud o su farmacutico. No debe amamantar a un beb mientras est tomando este medicamento. Este medicamento puede interferir con la capacidad de tener hijos. Este medicamento ha causado insuficiencia ovrica en [employer]. Este medicamento ha causado recuentos de esperma reducidos en algunos hombres. Usted debe hablar con su mdico o su profesional de la salud si est preocupado  por su fertilidad. Si le Lucianne Lei a Publishing copy, informe a su mdico o profesional de la salud que ha [employer]. Qu efectos secundarios puedo tener al [employer] este medicamento? Efectos secundarios que debe informar a su mdico o a Barrister's clerk de la salud tan pronto como sea posible: -reacciones alrgicas como erupcin cutnea, picazn o urticarias, hinchazn de la cara, labios o lengua -conteos sanguneos bajos - este medicamento puede reducir la cantidad de glbulos blancos, glbulos rojos y plaquetas. Su riesgo de infeccin y [place]. -signos de infeccin - fiebre o escalofros, tos, dolor de garganta, Social research officer, government o dificultad para Garment/textile technologist -signos de reduccin de plaquetas o sangrado - magulladuras, puntos rojos en la piel, heces de color oscuro o con aspecto alquitranado, sangre en la orina -signos de reduccin de glbulos rojos - cansancio o debilidad inusual, desmayos, aturdimiento -problemas respiratorios -orina oscura -mareos -palpitaciones -hinchazn de tobillos, pies o manos -dificultad para orinar o cambios en el volumen de orina -aumento de peso -color amarillento de los ojos o la piel Efectos secundarios que, por lo general, no requieren Geophysical data processor (debe informarlos a su mdico o a su profesional de la salud si persisten o si son molestos): -cambios en el color de las uas o piel -cada del cabello -ausencia de perodos menstruales -llagas en la boca -nuseas, vmito Puede ser que [employer] no menciona todos los posibles efectos secundarios. Comunquese a su mdico por asesoramiento mdico [employer] Usted puede informar los efectos secundarios a la FDA por telfono al 1-800-FDA-1088. Dnde debo guardar mi medicina? Este medicamento se administra en hospitales o clnicas y no necesitar guardarlo en su domicilio. ATENCIN:  Este folleto es un resumen. Puede ser que no cubra toda la posible informacin. Si usted tiene  preguntas acerca de esta medicina, consulte con su mdico, su farmacutico o su profesional de Technical sales engineer.  2018 Elsevier/Gold Standard (2016-04-03 00:00:00)

## 2016-10-07 NOTE — Patient Instructions (Signed)
The patient is aware to call back for any questions or concerns.  

## 2016-10-07 NOTE — Progress Notes (Signed)
Patient ID: Wanda Reed, female   DOB: 05/16/1955, 61 y.o.   MRN: [id number]  Chief Complaint  Patient presents with  . Routine Post Op    mammosite    HPI Wanda Reed is a 61 y.o. female.  She is here postop visit, right lumpectomy 08-28-16, mammosite completed 09-25-16. She states she can still feel a small bump. She states the area is still tender. She is here with her daughter, [name]. Interpreter, Chip Boer, present for interview, exam and discussion.[name]  HPI  Past Medical History:  Diagnosis Date  . Anxiety   . Breast cancer of upper-outer quadrant of right female breast ([place]) 08/2016   1.0 cm ER+, PR-, Her 2 neu not overexpressed. Node negative. T1b, N0.  [name] Cancer (HCC)    cervical  . Hypertension   . Thyroid disease     Past Surgical History:  Procedure Laterality Date  . ABDOMINAL HYSTERECTOMY    . BREAST LUMPECTOMY Right 08/28/2016  . BREAST LUMPECTOMY WITH SENTINEL LYMPH NODE BIOPSY Right 08/28/2016   Procedure: BREAST LUMPECTOMY WITH SENTINEL LYMPH NODE BX;  Surgeon: [name], MD;  Location: [facility] ORS;  Service: General;  Laterality: Right;  . BREAST MAMMOSITE Right 09/15/2016   Procedure: MAMMOSITE BREAST;  Surgeon: [name], MD;  Location: [facility] ORS;  Service: General;  Laterality: Right;  . BREAST SURGERY Right    Breast Biopsy  . CERVIX SURGERY    . COLONOSCOPY  2008  . EYE SURGERY Bilateral    Cataract Extraction with IOL  . SHOULDER ARTHROSCOPY Right     Family History  Problem Relation Age of Onset  . Breast cancer Neg Hx     Social History Social History  Substance Use Topics  . Smoking status: Never Smoker  . Smokeless tobacco: Never Used  . Alcohol use No    No Known Allergies  Current Outpatient Prescriptions  Medication Sig Dispense Refill  . acetaminophen (TYLENOL) 500 MG tablet Take 1,000 mg by mouth every 6 (six) hours as needed (for pain.).    [name] acyclovir (ZOVIRAX) 400 MG tablet Take 400 mg by mouth 3  (three) times daily as needed (cold sore outbreaks).     . hydrochlorothiazide (HYDRODIURIL) 25 MG tablet Take 25 mg by mouth daily.    [name] levothyroxine (SYNTHROID, LEVOTHROID) 100 MCG tablet Take 100 mcg by mouth daily before breakfast.    . lisinopril (PRINIVIL,ZESTRIL) 10 MG tablet Take 10 mg by mouth daily.    . ondansetron (ZOFRAN) 8 MG tablet Take 1 tablet (8 mg total) by mouth 2 (two) times daily as needed for refractory nausea / vomiting. 30 tablet 2  . prochlorperazine (COMPAZINE) 10 MG tablet Take 1 tablet (10 mg total) by mouth every 6 (six) hours as needed (Nausea or vomiting). 30 tablet 2   No current facility-administered medications for this visit.     Review of Systems Review of Systems  Constitutional: Negative.   Respiratory: Negative.   Cardiovascular: Negative.   Neurological: Positive for headaches.    Blood pressure 122/66, pulse 88, resp. rate 12, height _0  (1.549 m), weight 158 lb (71.7 kg).  Physical Exam Physical Exam  Constitutional: She is oriented to person, place, and time. She appears well-developed and well-nourished.  Pulmonary/Chest:    Right breast and lymph node site incisions clean  Musculoskeletal:       Arms: Neurological: She is alert and oriented to person, place, and time.  Skin: Skin is warm and dry.  Psychiatric:  Her behavior is normal.    Data Reviewed Mammoprint analysis showed the patient at high risk for recurrence with antiestrogen therapy alone. Medical oncology notes from earlier this month.  Assessment    Doing well status post accelerated partial breast radiation status post wide excision, sentinel node biopsy for a T1b, N0 ER/PR positive HER-2/neu negative tumor.    Plan    Reviewed indications for adjuvant chemotherapy in light of HIDA Mammoprint score with the patient and her daughter.    Anticipate the patient will be offered 4 cycles of adjuvant cvhemotherapy  Follow up in 2 months.  The breast biopsy  procedure was reviewed with the patient. The potential for bleeding, infection, and pain was reviewed. At this time, the benefits outweigh the risk and the patient is amenable to proceed.  [name] 10/07/2016, 11:48 AM

## 2016-10-08 ENCOUNTER — Inpatient Hospital Stay: Payer: Medicaid Other

## 2016-10-09 ENCOUNTER — Encounter: Payer: Self-pay | Admitting: *Deleted

## 2016-10-22 ENCOUNTER — Inpatient Hospital Stay: Payer: Medicaid Other | Admitting: Oncology

## 2016-10-22 ENCOUNTER — Inpatient Hospital Stay: Payer: Medicaid Other

## 2016-10-22 ENCOUNTER — Inpatient Hospital Stay: Payer: Medicaid Other | Attending: Oncology

## 2016-10-22 ENCOUNTER — Other Ambulatory Visit: Payer: Self-pay | Admitting: Oncology

## 2016-10-22 ENCOUNTER — Encounter: Payer: Self-pay | Admitting: Oncology

## 2016-10-22 ENCOUNTER — Inpatient Hospital Stay (HOSPITAL_BASED_OUTPATIENT_CLINIC_OR_DEPARTMENT_OTHER): Payer: Medicaid Other | Admitting: Oncology

## 2016-10-22 VITALS — BP 118/67 | HR 70 | Temp 97.8°F | Resp 20

## 2016-10-22 VITALS — BP 150/81 | HR 73 | Temp 97.6°F | Wt 161.4 lb

## 2016-10-22 DIAGNOSIS — F419 Anxiety disorder, unspecified: Secondary | ICD-10-CM

## 2016-10-22 DIAGNOSIS — R5383 Other fatigue: Secondary | ICD-10-CM | POA: Diagnosis not present

## 2016-10-22 DIAGNOSIS — Z79899 Other long term (current) drug therapy: Secondary | ICD-10-CM

## 2016-10-22 DIAGNOSIS — E079 Disorder of thyroid, unspecified: Secondary | ICD-10-CM | POA: Insufficient documentation

## 2016-10-22 DIAGNOSIS — R531 Weakness: Secondary | ICD-10-CM | POA: Insufficient documentation

## 2016-10-22 DIAGNOSIS — D696 Thrombocytopenia, unspecified: Secondary | ICD-10-CM

## 2016-10-22 DIAGNOSIS — C50411 Malignant neoplasm of upper-outer quadrant of right female breast: Secondary | ICD-10-CM | POA: Diagnosis present

## 2016-10-22 DIAGNOSIS — D709 Neutropenia, unspecified: Secondary | ICD-10-CM | POA: Diagnosis not present

## 2016-10-22 DIAGNOSIS — J029 Acute pharyngitis, unspecified: Secondary | ICD-10-CM | POA: Insufficient documentation

## 2016-10-22 DIAGNOSIS — I10 Essential (primary) hypertension: Secondary | ICD-10-CM | POA: Insufficient documentation

## 2016-10-22 DIAGNOSIS — R51 Headache: Secondary | ICD-10-CM | POA: Insufficient documentation

## 2016-10-22 DIAGNOSIS — Z170 Estrogen receptor positive status [ER+]: Secondary | ICD-10-CM

## 2016-10-22 DIAGNOSIS — M542 Cervicalgia: Secondary | ICD-10-CM | POA: Diagnosis not present

## 2016-10-22 LAB — CBC WITH DIFFERENTIAL/PLATELET
Basophils Absolute: 0 10*3/uL (ref 0–0.1)
Basophils Relative: 1 %
Eosinophils Absolute: 0.1 10*3/uL (ref 0–0.7)
Eosinophils Relative: 3 %
HCT: 36.9 % (ref 35.0–47.0)
Hemoglobin: 12.9 g/dL (ref 12.0–16.0)
Lymphocytes Relative: 27 %
Lymphs Abs: 0.9 10*3/uL — ABNORMAL LOW (ref 1.0–3.6)
MCH: 32.6 pg (ref 26.0–34.0)
MCHC: 34.9 g/dL (ref 32.0–36.0)
MCV: 93.4 fL (ref 80.0–100.0)
Monocytes Absolute: 0.3 10*3/uL (ref 0.2–0.9)
Monocytes Relative: 10 %
Neutro Abs: 2 10*3/uL (ref 1.4–6.5)
Neutrophils Relative %: 59 %
Platelets: 114 10*3/uL — ABNORMAL LOW (ref 150–440)
RBC: 3.95 MIL/uL (ref 3.80–5.20)
RDW: 12.9 % (ref 11.5–14.5)
WBC: 3.4 10*3/uL — ABNORMAL LOW (ref 3.6–11.0)

## 2016-10-22 LAB — COMPREHENSIVE METABOLIC PANEL
ALT: 14 U/L (ref 14–54)
AST: 30 U/L (ref 15–41)
Albumin: 3.6 g/dL (ref 3.5–5.0)
Alkaline Phosphatase: 86 U/L (ref 38–126)
Anion gap: 8 (ref 5–15)
BUN: 16 mg/dL (ref 6–20)
CO2: 27 mmol/L (ref 22–32)
Calcium: 9 mg/dL (ref 8.9–10.3)
Chloride: 101 mmol/L (ref 101–111)
Creatinine, Ser: 0.63 mg/dL (ref 0.44–1.00)
GFR calc Af Amer: >60 mL/min (ref >60)
GFR calc non Af Amer: >60 mL/min (ref >60)
Glucose, Bld: 155 mg/dL — ABNORMAL HIGH (ref 65–99)
Potassium: 4.1 mmol/L (ref 3.5–5.1)
Sodium: 136 mmol/L (ref 135–145)
Total Bilirubin: 0.7 mg/dL (ref 0.3–1.2)
Total Protein: 7.1 g/dL (ref 6.5–8.1)

## 2016-10-22 MED ORDER — SODIUM CHLORIDE 0.9 % IV SOLN
1000.0000 mg | Freq: Once | INTRAVENOUS | Status: AC
  Administered 2016-10-22: 1000 mg via INTRAVENOUS
  Filled 2016-10-22: qty 50

## 2016-10-22 MED ORDER — DEXAMETHASONE SODIUM PHOSPHATE 10 MG/ML IJ SOLN
10.0000 mg | Freq: Once | INTRAMUSCULAR | Status: AC
  Administered 2016-10-22: 10 mg via INTRAVENOUS
  Filled 2016-10-22: qty 1

## 2016-10-22 MED ORDER — PALONOSETRON HCL INJECTION 0.25 MG/5ML
0.2500 mg | Freq: Once | INTRAVENOUS | Status: AC
  Administered 2016-10-22: 0.25 mg via INTRAVENOUS
  Filled 2016-10-22: qty 5

## 2016-10-22 MED ORDER — DEXAMETHASONE SODIUM PHOSPHATE 100 MG/10ML IJ SOLN: 10.0000 mg | Freq: Once | INTRAMUSCULAR | Status: DC

## 2016-10-22 MED ORDER — SODIUM CHLORIDE 0.9 % IV SOLN
75.0000 mg/m2 | Freq: Once | INTRAVENOUS | Status: AC
  Administered 2016-10-22: 130 mg via INTRAVENOUS
  Filled 2016-10-22: qty 13

## 2016-10-22 MED ORDER — PEGFILGRASTIM 6 MG/0.6ML ~~LOC~~ PSKT
6.0000 mg | PREFILLED_SYRINGE | Freq: Once | SUBCUTANEOUS | Status: AC
  Administered 2016-10-22: 6 mg via SUBCUTANEOUS
  Filled 2016-10-22: qty 0.6

## 2016-10-22 MED ORDER — SODIUM CHLORIDE 0.9 % IV SOLN
Freq: Once | INTRAVENOUS | Status: AC
  Administered 2016-10-22: 11:00:00 via INTRAVENOUS
  Filled 2016-10-22: qty 1000

## 2016-10-22 NOTE — Progress Notes (Signed)
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 High risk MammaPrint.  INTERVAL HISTORY: Patient returns to clinic today for further evaluation and consideration of cycle 1 of Taxotere and Cytoxan. She is anxious, but otherwise feels well. She has no neurologic complaints. She denies any recent fevers or illnesses. She has a good appetite and denies weight loss. She has no chest pain or shortness of breath. She denies any nausea, vomiting, constipation, or diarrhea. She has no urinary complaints. Patient offers no further specific complaints today.  REVIEW OF SYSTEMS:   Review of Systems  Constitutional: Negative.  Negative for fever, malaise/fatigue and weight loss.  Respiratory: Negative.  Negative for cough and shortness of breath.   Cardiovascular: Negative.  Negative for chest pain and leg swelling.  Gastrointestinal: Negative.  Negative for abdominal pain.  Genitourinary: Negative.   Musculoskeletal: Negative.   Skin: Negative.  Negative for rash.  Neurological: Negative.  Negative for weakness.  Psychiatric/Behavioral: The patient is nervous/anxious.     As per HPI. Otherwise, a complete review of systems is negative.  PAST MEDICAL HISTORY: Past Medical History:  Diagnosis Date  . Anxiety   . Breast cancer of upper-outer quadrant of right female breast ([place]) 08/2016   1.0 cm ER+, PR-, Her 2 neu not overexpressed. Node negative. T1b, N0.  [name] Cancer (HCC)    cervical  . Hypertension   . Thyroid disease     PAST SURGICAL HISTORY: Past Surgical  History:  Procedure Laterality Date  . ABDOMINAL HYSTERECTOMY    . BREAST LUMPECTOMY Right 08/28/2016  . BREAST LUMPECTOMY WITH SENTINEL LYMPH NODE BIOPSY Right 08/28/2016   Procedure: BREAST LUMPECTOMY WITH SENTINEL LYMPH NODE BX;  Surgeon: [name], MD;  Location: [facility] ORS;  Service: General;  Laterality: Right;  . BREAST MAMMOSITE Right 09/15/2016   Procedure: MAMMOSITE BREAST;  Surgeon: [name], MD;  Location: [facility] ORS;  Service: General;  Laterality: Right;  . BREAST SURGERY Right    Breast Biopsy  . CERVIX SURGERY    . COLONOSCOPY  2008  . EYE SURGERY Bilateral    Cataract Extraction with IOL  . SHOULDER ARTHROSCOPY Right     FAMILY HISTORY: Family History  Problem Relation Age of Onset  . Breast cancer Neg Hx     ADVANCED DIRECTIVES (Y/N):  N  HEALTH MAINTENANCE: Social History  Substance Use Topics  . Smoking status: Never Smoker  . Smokeless tobacco: Never Used  . Alcohol use No     Colonoscopy:  PAP:  Bone density:  Lipid panel:  No Known Allergies  Current Outpatient Prescriptions  Medication Sig Dispense Refill  . acetaminophen (TYLENOL) 500 MG tablet Take 1,000 mg by mouth every 6 (six) hours as needed (for pain.).    [name] acyclovir (ZOVIRAX) 400 MG tablet Take 400 mg by mouth 3 (three) times daily as needed (cold sore outbreaks).     . hydrochlorothiazide (HYDRODIURIL) 25 MG tablet Take 25 mg by mouth daily.    [name] levothyroxine (SYNTHROID, LEVOTHROID) 100 MCG tablet Take 100 mcg by mouth daily before breakfast.    . lisinopril (PRINIVIL,ZESTRIL) 10  MG tablet Take 10 mg by mouth daily.    . ondansetron (ZOFRAN) 8 MG tablet Take 1 tablet (8 mg total) by mouth 2 (two) times daily as needed for refractory nausea / vomiting. 30 tablet 2  . prochlorperazine (COMPAZINE) 10 MG tablet Take 1 tablet (10 mg total) by mouth every 6 (six) hours as needed (Nausea or vomiting). 30 tablet 2   No current facility-administered medications for this visit.       OBJECTIVE: Vitals:   10/22/16 0937  BP: (!) 150/81  Pulse: 73  Temp: 97.6 F (36.4 C)     Body mass index is 30.5 kg/m.    ECOG FS:0 - Asymptomatic  General: Well-developed, well-nourished, no acute distress. Eyes: Pink conjunctiva, anicteric sclera. Breasts: Exam deferred today. Lungs: Clear to auscultation bilaterally. Heart: Regular rate and rhythm. No rubs, murmurs, or gallops. Abdomen: Soft, nontender, nondistended. No organomegaly noted, normoactive bowel sounds. Musculoskeletal: No edema, cyanosis, or clubbing. Neuro: Alert, answering all questions appropriately. Cranial nerves grossly intact. Skin: No rashes or petechiae noted. Psych: Normal affect.  LAB RESULTS:  Lab Results  Component Value Date   NA 136 10/22/2016   K 4.1 10/22/2016   CL 101 10/22/2016   CO2 27 10/22/2016   GLUCOSE 155 (H) 10/22/2016   BUN 16 10/22/2016   CREATININE 0.63 10/22/2016   CALCIUM 9.0 10/22/2016   PROT 7.1 10/22/2016   ALBUMIN 3.6 10/22/2016   AST 30 10/22/2016   ALT 14 10/22/2016   ALKPHOS 86 10/22/2016   BILITOT 0.7 10/22/2016   GFRNONAA >60 10/22/2016   GFRAA >60 10/22/2016    Lab Results  Component Value Date   WBC 3.4 (L) 10/22/2016   NEUTROABS 2.0 10/22/2016   HGB 12.9 10/22/2016   HCT 36.9 10/22/2016   MCV 93.4 10/22/2016   PLT 114 (L) 10/22/2016     STUDIES: No results found.  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 High risk MammaPrint.  PLAN:    1. 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 High risk MammaPrint. Patient had lumpectomy on August 28, 2016 followed by MammoSite radiation therapy. Given her high risk MammaPrint, have recommended adjuvant chemotherapy. Proceed with cycle 1 of 4 of Taxotere and Cytoxan. She will require Neulasta support. She does not require port at this time,  but will consider one in the future if IV access becomes difficult. At the conclusion of all her treatments, patient will benefit from an aromatase inhibitor given the ER status of her tumor. Return in 1 week for repeat laboratory work and further evaluation and then in 3 weeks for consideration of cycle 2.  2. Thrombocytopenia: Mild, monitor.  Approximately 30 minutes was spent in discussion of which greater than 50% was consultation.  Patient's daughter acted as an Astronomer today.  Patient expressed understanding and was in agreement with this plan. She also understands that She can call clinic at any time with any questions, concerns, or complaints.   Cancer Staging Malignant neoplasm of upper-outer quadrant of right breast in female, estrogen receptor positive ([place]) Staging form: Breast, AJCC 8th Edition - Pathologic stage from 10/01/2016: Stage IA (pT1b, pN0, cM0, G2, ER: Positive, PR: Negative, HER2: Negative) - Signed by Lloyd Huger, MD on 10/01/2016   Lloyd Huger, MD   10/25/2016 6:59 AM

## 2016-10-24 ENCOUNTER — Telehealth: Payer: Self-pay | Admitting: *Deleted

## 2016-10-24 NOTE — Telephone Encounter (Signed)
Called to report that patient is having aching all over and feels like she is getting the flu, she has a h/a and her throat is a little sore. She had chemo this week and took her On Pro off yesterday. I explained that the Neulasta will cause her to feel achy and asked if she has taken anything for it. She said no that she can not take anything. I asked her to take Tylenol up to 4 doses a day. She said she will have her take that and I told her no IBU or ASA which she relayed to patient. She does not know if she is fevered, she does not have a thermometer, but she does not have chills. Advised to call back if she feels chilled and fevered and to ask for a thermometer next time she is in the office.

## 2016-10-26 ENCOUNTER — Encounter: Payer: Self-pay | Admitting: Radiology

## 2016-10-26 ENCOUNTER — Emergency Department: Payer: Medicaid Other

## 2016-10-26 ENCOUNTER — Inpatient Hospital Stay
Admission: EM | Admit: 2016-10-26 | Discharge: 2016-10-30 | DRG: 871 | Disposition: A | Discharge status: Home / Self Care | Payer: Medicaid Other | Attending: Internal Medicine | Admitting: Internal Medicine

## 2016-10-26 DIAGNOSIS — Z9841 Cataract extraction status, right eye: Secondary | ICD-10-CM | POA: Diagnosis not present

## 2016-10-26 DIAGNOSIS — T451X5A Adverse effect of antineoplastic and immunosuppressive drugs, initial encounter: Secondary | ICD-10-CM | POA: Diagnosis present

## 2016-10-26 DIAGNOSIS — R531 Weakness: Secondary | ICD-10-CM | POA: Diagnosis not present

## 2016-10-26 DIAGNOSIS — E079 Disorder of thyroid, unspecified: Secondary | ICD-10-CM | POA: Diagnosis not present

## 2016-10-26 DIAGNOSIS — Z170 Estrogen receptor positive status [ER+]: Secondary | ICD-10-CM

## 2016-10-26 DIAGNOSIS — D61810 Antineoplastic chemotherapy induced pancytopenia: Secondary | ICD-10-CM | POA: Diagnosis present

## 2016-10-26 DIAGNOSIS — Z79899 Other long term (current) drug therapy: Secondary | ICD-10-CM

## 2016-10-26 DIAGNOSIS — A419 Sepsis, unspecified organism: Principal | ICD-10-CM | POA: Diagnosis present

## 2016-10-26 DIAGNOSIS — Z961 Presence of intraocular lens: Secondary | ICD-10-CM | POA: Diagnosis present

## 2016-10-26 DIAGNOSIS — E871 Hypo-osmolality and hyponatremia: Secondary | ICD-10-CM | POA: Diagnosis present

## 2016-10-26 DIAGNOSIS — M791 Myalgia, unspecified site: Secondary | ICD-10-CM

## 2016-10-26 DIAGNOSIS — N61 Mastitis without abscess: Secondary | ICD-10-CM | POA: Diagnosis present

## 2016-10-26 DIAGNOSIS — F419 Anxiety disorder, unspecified: Secondary | ICD-10-CM | POA: Diagnosis present

## 2016-10-26 DIAGNOSIS — D709 Neutropenia, unspecified: Secondary | ICD-10-CM

## 2016-10-26 DIAGNOSIS — R5081 Fever presenting with conditions classified elsewhere: Secondary | ICD-10-CM | POA: Diagnosis present

## 2016-10-26 DIAGNOSIS — R509 Fever, unspecified: Secondary | ICD-10-CM | POA: Diagnosis not present

## 2016-10-26 DIAGNOSIS — E039 Hypothyroidism, unspecified: Secondary | ICD-10-CM | POA: Diagnosis present

## 2016-10-26 DIAGNOSIS — Z8541 Personal history of malignant neoplasm of cervix uteri: Secondary | ICD-10-CM | POA: Diagnosis not present

## 2016-10-26 DIAGNOSIS — D701 Agranulocytosis secondary to cancer chemotherapy: Secondary | ICD-10-CM | POA: Diagnosis present

## 2016-10-26 DIAGNOSIS — Z9842 Cataract extraction status, left eye: Secondary | ICD-10-CM

## 2016-10-26 DIAGNOSIS — R6889 Other general symptoms and signs: Secondary | ICD-10-CM

## 2016-10-26 DIAGNOSIS — C50411 Malignant neoplasm of upper-outer quadrant of right female breast: Secondary | ICD-10-CM | POA: Diagnosis present

## 2016-10-26 DIAGNOSIS — D72829 Elevated white blood cell count, unspecified: Secondary | ICD-10-CM | POA: Diagnosis not present

## 2016-10-26 DIAGNOSIS — I10 Essential (primary) hypertension: Secondary | ICD-10-CM | POA: Diagnosis present

## 2016-10-26 DIAGNOSIS — R5383 Other fatigue: Secondary | ICD-10-CM | POA: Diagnosis not present

## 2016-10-26 DIAGNOSIS — J029 Acute pharyngitis, unspecified: Secondary | ICD-10-CM | POA: Diagnosis present

## 2016-10-26 LAB — URINALYSIS, ROUTINE W REFLEX MICROSCOPIC
Bilirubin Urine: NEGATIVE
Glucose, UA: NEGATIVE mg/dL
Hgb urine dipstick: NEGATIVE
Ketones, ur: NEGATIVE mg/dL
Leukocytes, UA: NEGATIVE
Nitrite: NEGATIVE
Protein, ur: NEGATIVE mg/dL
Specific Gravity, Urine: 1.018 (ref 1.005–1.030)
pH: 7 (ref 5.0–8.0)

## 2016-10-26 LAB — CBC WITH DIFFERENTIAL/PLATELET
Band Neutrophils: 7 %
Basophils Absolute: 0 10*3/uL (ref 0–0.1)
Basophils Relative: 0 %
Blasts: 0 %
Eosinophils Absolute: 0 10*3/uL (ref 0–0.7)
Eosinophils Relative: 0 %
HCT: 38.1 % (ref 35.0–47.0)
Hemoglobin: 13.1 g/dL (ref 12.0–16.0)
Lymphocytes Relative: 21 %
Lymphs Abs: 0.4 10*3/uL — ABNORMAL LOW (ref 1.0–3.6)
MCH: 32 pg (ref 26.0–34.0)
MCHC: 34.3 g/dL (ref 32.0–36.0)
MCV: 93.3 fL (ref 80.0–100.0)
Metamyelocytes Relative: 0 %
Monocytes Absolute: 0.1 10*3/uL — ABNORMAL LOW (ref 0.2–0.9)
Monocytes Relative: 4 %
Myelocytes: 0 %
Neutro Abs: 1.5 10*3/uL (ref 1.4–6.5)
Neutrophils Relative %: 66 %
Other: 2 %
Platelets: 64 10*3/uL — ABNORMAL LOW (ref 150–440)
Promyelocytes Absolute: 0 %
RBC: 4.08 MIL/uL (ref 3.80–5.20)
RDW: 12.3 % (ref 11.5–14.5)
WBC: 2.1 10*3/uL — ABNORMAL LOW (ref 3.6–11.0)
nRBC: 0 /100 WBC

## 2016-10-26 LAB — COMPREHENSIVE METABOLIC PANEL
ALT: 18 U/L (ref 14–54)
AST: 31 U/L (ref 15–41)
Albumin: 3.7 g/dL (ref 3.5–5.0)
Alkaline Phosphatase: 85 U/L (ref 38–126)
Anion gap: 9 (ref 5–15)
BUN: 10 mg/dL (ref 6–20)
CO2: 26 mmol/L (ref 22–32)
Calcium: 8.7 mg/dL — ABNORMAL LOW (ref 8.9–10.3)
Chloride: 94 mmol/L — ABNORMAL LOW (ref 101–111)
Creatinine, Ser: 0.6 mg/dL (ref 0.44–1.00)
GFR calc Af Amer: >60 mL/min (ref >60)
GFR calc non Af Amer: >60 mL/min (ref >60)
Glucose, Bld: 133 mg/dL — ABNORMAL HIGH (ref 65–99)
Potassium: 3.4 mmol/L — ABNORMAL LOW (ref 3.5–5.1)
Sodium: 129 mmol/L — ABNORMAL LOW (ref 135–145)
Total Bilirubin: 1.1 mg/dL (ref 0.3–1.2)
Total Protein: 7.2 g/dL (ref 6.5–8.1)

## 2016-10-26 LAB — LACTIC ACID, PLASMA: Lactic Acid, Venous: 1.6 mmol/L (ref 0.5–1.9)

## 2016-10-26 MED ORDER — MORPHINE SULFATE (PF) 4 MG/ML IV SOLN
4.0000 mg | INTRAVENOUS | Status: DC | PRN
  Administered 2016-10-26 – 2016-10-28 (×10): 4 mg via INTRAVENOUS
  Filled 2016-10-26 (×10): qty 1

## 2016-10-26 MED ORDER — VANCOMYCIN HCL IN DEXTROSE 1-5 GM/200ML-% IV SOLN
1000.0000 mg | Freq: Once | INTRAVENOUS | Status: AC
  Administered 2016-10-26: 1000 mg via INTRAVENOUS
  Filled 2016-10-26: qty 200

## 2016-10-26 MED ORDER — PIPERACILLIN-TAZOBACTAM 3.375 G IVPB
3.3750 g | Freq: Three times a day (TID) | INTRAVENOUS | Status: DC
  Administered 2016-10-27 – 2016-10-30 (×10): 3.375 g via INTRAVENOUS
  Filled 2016-10-26 (×10): qty 50

## 2016-10-26 MED ORDER — PIPERACILLIN-TAZOBACTAM 3.375 G IVPB 30 MIN
3.3750 g | Freq: Once | INTRAVENOUS | Status: AC
  Administered 2016-10-26: 3.375 g via INTRAVENOUS

## 2016-10-26 MED ORDER — VANCOMYCIN HCL IN DEXTROSE 750-5 MG/150ML-% IV SOLN
750.0000 mg | Freq: Two times a day (BID) | INTRAVENOUS | Status: DC
  Administered 2016-10-27 – 2016-10-28 (×3): 750 mg via INTRAVENOUS
  Filled 2016-10-26 (×4): qty 150

## 2016-10-26 MED ORDER — SODIUM CHLORIDE 0.9 % IV BOLUS (SEPSIS)
1000.0000 mL | Freq: Once | INTRAVENOUS | Status: AC
  Administered 2016-10-26: 1000 mL via INTRAVENOUS

## 2016-10-26 MED ORDER — PROMETHAZINE HCL 25 MG/ML IJ SOLN
12.5000 mg | Freq: Four times a day (QID) | INTRAMUSCULAR | Status: DC | PRN
  Administered 2016-10-26: 12.5 mg via INTRAVENOUS
  Filled 2016-10-26: qty 1

## 2016-10-26 MED ORDER — POTASSIUM CHLORIDE 20 MEQ PO PACK
40.0000 meq | PACK | ORAL | Status: AC
  Administered 2016-10-26: 40 meq via ORAL
  Filled 2016-10-26: qty 2

## 2016-10-26 MED ORDER — PIPERACILLIN-TAZOBACTAM 3.375 G IVPB 30 MIN
INTRAVENOUS | Status: AC
  Filled 2016-10-26: qty 50

## 2016-10-26 MED ORDER — IOPAMIDOL (ISOVUE-300) INJECTION 61%
75.0000 mL | Freq: Once | INTRAVENOUS | Status: AC | PRN
  Administered 2016-10-26: 75 mL via INTRAVENOUS

## 2016-10-26 NOTE — ED Triage Notes (Signed)
Pt reports chemo treatement on the 8th for breast cancer, pt states for the past 3 days she has had bodyaches, headache, and sore throat, pt appears miserable and states that she feels really bad

## 2016-10-26 NOTE — Progress Notes (Signed)
Pharmacy Antibiotic Note  Wanda Reed is a 61 y.o. female admitted on 10/26/2016 with  sepsis.  Pharmacy has been consulted for Vancomycin and Zosyn dosing. Patient received vancomycin 1g IV x 1 dose and Zosyn 3.375 IV x 1 dose in ED.   Plan: Ke: 0.068   Vd: 42   T1/2: 10.2  Will start patient on vancomycin 750mg  IV every 12 hours with 6 hour stack dosing. Calculated trough at Css is 15. Trough ordered prior to 4th dose. Will monitor renal function and adjust dose as needed.  Will start patient on Zosyn 3.375 IV EI every 8 hours.   Height: 5\' 1"  (154.9 cm) Weight: 161 lb (73 kg) IBW/kg (Calculated) : 47.8  Temp (24hrs), Avg:100.2 F (37.9 C), Min:100.2 F (37.9 C), Max:100.2 F (37.9 C)   Recent Labs Lab 10/22/16 0910 10/26/16 1850 10/26/16 1853  WBC 3.4* 2.1*  --   CREATININE 0.63 0.60  --   LATICACIDVEN  --   --  1.6    Estimated Creatinine Clearance: 67.5 mL/min (by C-G formula based on SCr of 0.6 mg/dL).    No Known Allergies  Antimicrobials this admission: 8/12 Zosyn >>  8/12 Vancomycin  >>   Dose adjustments this admission:  Microbiology results: 8/12  BCx: sent  Thank you for allowing pharmacy to be a part of this patient's care.  Pernell Dupre, PharmD, BCPS Clinical Pharmacist 10/26/2016 9:42 PM

## 2016-10-26 NOTE — ED Notes (Signed)
POCT Rapid Strep A: Negitive 

## 2016-10-26 NOTE — ED Provider Notes (Signed)
[facility] Department Provider Note    First MD Initiated Contact with Patient 10/26/16 1829     (approximate)  I have reviewed the triage vital signs and the nursing notes.   HISTORY  Chief Complaint Generalized Body Aches    HPI Wanda Reed is a 61 y.o. female history of breast cancer currently on chemotherapy presents with 3 days of generalized myalgias, sore throat and headache associated with decreased by mouth intake. Patient is on chemotherapy. Has not been on any recent antibiotics. Patient came in today due to worsening pain chills and rigors. States she's also had some nausea but no vomiting. Denies any dysuria or flank pain.   Past Medical History:  Diagnosis Date  . Anxiety   . Breast cancer of upper-outer quadrant of right female breast ([place]) 08/2016   1.0 cm ER+, PR-, Her 2 neu not overexpressed. Node negative. T1b, N0.  [name] Cancer (HCC)    cervical  . Hypertension   . Thyroid disease    Family History  Problem Relation Age of Onset  . Breast cancer Neg Hx    Past Surgical History:  Procedure Laterality Date  . ABDOMINAL HYSTERECTOMY    . BREAST LUMPECTOMY Right 08/28/2016  . BREAST LUMPECTOMY WITH SENTINEL LYMPH NODE BIOPSY Right 08/28/2016   Procedure: BREAST LUMPECTOMY WITH SENTINEL LYMPH NODE BX;  Surgeon: [name], MD;  Location: [facility] ORS;  Service: General;  Laterality: Right;  . BREAST MAMMOSITE Right 09/15/2016   Procedure: MAMMOSITE BREAST;  Surgeon: [name], MD;  Location: [facility] ORS;  Service: General;  Laterality: Right;  . BREAST SURGERY Right    Breast Biopsy  . CERVIX SURGERY    . COLONOSCOPY  2008  . EYE SURGERY Bilateral    Cataract Extraction with IOL  . SHOULDER ARTHROSCOPY Right    Patient Active Problem List   Diagnosis Date Noted  . Sepsis ([place]) 10/26/2016  . Hyperkalemia 08/28/2016  . Wide-complex tachycardia ([place]) 08/28/2016  . Malignant neoplasm of upper-outer quadrant  of right breast in female, estrogen receptor positive ([place]) 08/18/2016      Prior to Admission medications   Medication Sig Start Date End Date Taking? Authorizing Provider  acetaminophen (TYLENOL) 500 MG tablet Take 1,000 mg by mouth every 6 (six) hours as needed (for pain.).   Yes [provider]  acyclovir (ZOVIRAX) 400 MG tablet Take 400 mg by mouth 3 (three) times daily as needed (cold sore outbreaks).    Yes [provider]  hydrochlorothiazide (HYDRODIURIL) 25 MG tablet Take 25 mg by mouth daily.   Yes [provider]  levothyroxine (SYNTHROID, LEVOTHROID) 100 MCG tablet Take 100 mcg by mouth daily before breakfast.   Yes [provider]  lisinopril (PRINIVIL,ZESTRIL) 10 MG tablet Take 10 mg by mouth daily.   Yes [provider]  prochlorperazine (COMPAZINE) 10 MG tablet Take 1 tablet (10 mg total) by mouth every 6 (six) hours as needed (Nausea or vomiting). 10/05/16  Yes Lloyd Huger, MD  ondansetron (ZOFRAN) 8 MG tablet Take 1 tablet (8 mg total) by mouth 2 (two) times daily as needed for refractory nausea / vomiting. 10/05/16   Lloyd Huger, MD    Allergies Patient has no known allergies.    Social History Social History  Substance Use Topics  . Smoking status: Never Smoker  . Smokeless tobacco: Never Used  . Alcohol use No    Review of Systems Patient denies headaches, rhinorrhea, blurry vision,  numbness, shortness of breath, chest pain, edema, cough, abdominal pain, nausea, vomiting, diarrhea, dysuria, fevers, rashes or hallucinations unless otherwise stated above in HPI. ____________________________________________   PHYSICAL EXAM:  VITAL SIGNS: Vitals:   10/26/16 2215 10/26/16 2230  BP:  (!) 109/52  Pulse: (!) 106 (!) 101  Resp: 19 17  Temp:    SpO2: 97% 97%    Constitutional: Alert and oriented. ill appearing but in no acute distress. Eyes: Conjunctivae are normal.  Head: Atraumatic. Nose: No  congestion/rhinnorhea. Mouth/Throat: Mucous membranes are moist, uvula midline, no trismus, no exudates, ttp sublingual region without erythema or crepitus Neck: No stridor. Painless ROM.  Cardiovascular: Normal rate, regular rhythm. Grossly normal heart sounds.  Good peripheral circulation. Respiratory: Normal respiratory effort.  No retractions. Lungs CTAB. Gastrointestinal: Soft and nontender. No distention. No abdominal bruits. No CVA tenderness. Genitourinary:  Musculoskeletal: No lower extremity tenderness nor edema.  No joint effusions. Neurologic:  Normal speech and language. No gross focal neurologic deficits are appreciated. No facial droop Skin:  Skin is warm, dry and intact. No rash noted. Psychiatric: Mood and affect are normal. Speech and behavior are normal.  ____________________________________________   LABS (all labs ordered are listed, but only abnormal results are displayed)  Results for orders placed or performed during the hospital encounter of 10/26/16 (from the past 24 hour(s))  Comprehensive metabolic panel     Status: Abnormal   Collection Time: 10/26/16  6:50 PM  Result Value Ref Range   Sodium 129 (L) 135 - 145 mmol/L   Potassium 3.4 (L) 3.5 - 5.1 mmol/L   Chloride 94 (L) 101 - 111 mmol/L   CO2 26 22 - 32 mmol/L   Glucose, Bld 133 (H) 65 - 99 mg/dL   BUN 10 6 - 20 mg/dL   Creatinine, Ser 0.60 0.44 - 1.00 mg/dL   Calcium 8.7 (L) 8.9 - 10.3 mg/dL   Total Protein 7.2 6.5 - 8.1 g/dL   Albumin 3.7 3.5 - 5.0 g/dL   AST 31 15 - 41 U/L   ALT 18 14 - 54 U/L   Alkaline Phosphatase 85 38 - 126 U/L   Total Bilirubin 1.1 0.3 - 1.2 mg/dL   GFR calc non Af Amer >60 >60 mL/min   GFR calc Af Amer >60 >60 mL/min   Anion gap 9 5 - 15  CBC WITH DIFFERENTIAL     Status: Abnormal   Collection Time: 10/26/16  6:50 PM  Result Value Ref Range   WBC 2.1 (L) 3.6 - 11.0 K/uL   RBC 4.08 3.80 - 5.20 MIL/uL   Hemoglobin 13.1 12.0 - 16.0 g/dL   HCT 38.1 35.0 - 47.0 %   MCV  93.3 80.0 - 100.0 fL   MCH 32.0 26.0 - 34.0 pg   MCHC 34.3 32.0 - 36.0 g/dL   RDW 12.3 11.5 - 14.5 %   Platelets 64 (L) 150 - 440 K/uL   Neutrophils Relative % 66 %   Lymphocytes Relative 21 %   Monocytes Relative 4 %   Eosinophils Relative 0 %   Basophils Relative 0 %   Band Neutrophils 7 %   Metamyelocytes Relative 0 %   Myelocytes 0 %   Promyelocytes Absolute 0 %   Blasts 0 %   nRBC 0 0 /100 WBC   Other 2 %   Neutro Abs 1.5 1.4 - 6.5 K/uL   Lymphs Abs 0.4 (L) 1.0 - 3.6 K/uL   Monocytes Absolute 0.1 (L) 0.2 - 0.9 K/uL  Eosinophils Absolute 0.0 0 - 0.7 K/uL   Basophils Absolute 0.0 0 - 0.1 K/uL   RBC Morphology MIXED RBC POPULATION    Smear Review      PATH REVIEW HAS BEEN ORDERED ON THIS ACCESSION NUMBER  Lactic acid, plasma     Status: None   Collection Time: 10/26/16  6:53 PM  Result Value Ref Range   Lactic Acid, Venous 1.6 0.5 - 1.9 mmol/L  Urinalysis, Routine w reflex microscopic     Status: Abnormal   Collection Time: 10/26/16  8:58 PM  Result Value Ref Range   Color, Urine COLORLESS (A) YELLOW   APPearance CLEAR (A) CLEAR   Specific Gravity, Urine 1.018 1.005 - 1.030   pH 7.0 5.0 - 8.0   Glucose, UA NEGATIVE NEGATIVE mg/dL   Hgb urine dipstick NEGATIVE NEGATIVE   Bilirubin Urine NEGATIVE NEGATIVE   Ketones, ur NEGATIVE NEGATIVE mg/dL   Protein, ur NEGATIVE NEGATIVE mg/dL   Nitrite NEGATIVE NEGATIVE   Leukocytes, UA NEGATIVE NEGATIVE   ____________________________________________  EKG My review and personal interpretation at Time: 19:05   Indication: tachycardi  Rate: 110  Rhythm: sinus Axis: normal Other: nonspecific st changes, normal intervals,  ____________________________________________  RADIOLOGY  I personally reviewed all radiographic images ordered to evaluate for the above acute complaints and reviewed radiology reports and findings.  These findings were personally discussed with the patient.  Please see medical record for radiology  report.  ____________________________________________   PROCEDURES  Procedure(s) performed:  Procedures    Critical Care performed: yes CRITICAL CARE Performed by: Merlyn Lot   Total critical care time: 30 minutes  Critical care time was exclusive of separately billable procedures and treating other patients.  Critical care was necessary to treat or prevent imminent or life-threatening deterioration.  Critical care was time spent personally by me on the following activities: development of treatment plan with patient and/or surrogate as well as nursing, discussions with consultants, evaluation of patient's response to treatment, examination of patient, obtaining history from patient or surrogate, ordering and performing treatments and interventions, ordering and review of laboratory studies, ordering and review of radiographic studies, pulse oximetry and re-evaluation of patient's condition.  ____________________________________________   INITIAL IMPRESSION / ASSESSMENT AND PLAN / ED COURSE  Pertinent labs & imaging results that were available during my care of the patient were reviewed by me and considered in my medical decision making (see chart for details).  DDX: sepsis, ludwigs, strep, pharyngitis, thyroiditis, bacteremia, flu like symptoms, pna  Stephanie Littman is a 45 y.o. who presents to the ED with symptoms as described above. Patient is a low-grade fever and tachycardia. Patient's presentation, located by the family that she is on chemotherapy. Patient not well-appearing but is not critically ill. We'll provide IV fluids as well as IV symptomatically management. We'll obtain blood cultures. Based on her pain under her tongue with sore throat will order CT scan to evaluate for evidence of Ludwig angina.  Have discussed with the patient and available family all diagnostics and treatments performed thus far and all questions were answered to the best of my ability. The  patient demonstrates understanding and agreement with plan.   Clinical Course as of Oct 27 2142  Nancy Fetter Oct 26, 2016  2025 CT with no evidence of ludwigs, abscess or glossitis.  [PR]    Clinical Course User Index [PR] Merlyn Lot, MD   ----------------------------------------- 9:47 PM on 10/26/2016 -----------------------------------------   Based on patient's differential does appear that she has  neutropenic fever therefore based on her illness tachycardia and service criteria due to the patient will benefit from admission to hospital for continued hemodynamic monitoring while we await culture data.  ____________________________________________   FINAL CLINICAL IMPRESSION(S) / ED DIAGNOSES  Final diagnoses:  Myalgia  Flu-like symptoms  Fever and neutropenia (HCC)      NEW MEDICATIONS STARTED DURING THIS VISIT:  New Prescriptions   No medications on file     Note:  This document was prepared using Dragon voice recognition software and may include unintentional dictation errors.    Merlyn Lot, MD 10/26/16 [phone]

## 2016-10-26 NOTE — ED Notes (Signed)
Pt in CT.

## 2016-10-26 NOTE — ED Notes (Signed)
FIRST NURSE NOTE:  Pt here with daughter, is spanish-speaking, Interpreter requested for triage.  Daughter who speaks english states patient is a [place] patient and has been c/o chills and sore throat.

## 2016-10-27 ENCOUNTER — Encounter: Payer: Self-pay | Admitting: Oncology

## 2016-10-27 DIAGNOSIS — R5383 Other fatigue: Secondary | ICD-10-CM

## 2016-10-27 DIAGNOSIS — E079 Disorder of thyroid, unspecified: Secondary | ICD-10-CM

## 2016-10-27 DIAGNOSIS — F419 Anxiety disorder, unspecified: Secondary | ICD-10-CM

## 2016-10-27 DIAGNOSIS — R509 Fever, unspecified: Secondary | ICD-10-CM

## 2016-10-27 DIAGNOSIS — R531 Weakness: Secondary | ICD-10-CM

## 2016-10-27 DIAGNOSIS — D72829 Elevated white blood cell count, unspecified: Secondary | ICD-10-CM

## 2016-10-27 DIAGNOSIS — Z170 Estrogen receptor positive status [ER+]: Secondary | ICD-10-CM

## 2016-10-27 DIAGNOSIS — M791 Myalgia: Secondary | ICD-10-CM

## 2016-10-27 DIAGNOSIS — I10 Essential (primary) hypertension: Secondary | ICD-10-CM

## 2016-10-27 DIAGNOSIS — C50411 Malignant neoplasm of upper-outer quadrant of right female breast: Secondary | ICD-10-CM

## 2016-10-27 LAB — TSH: TSH: 6.35 u[IU]/mL — ABNORMAL HIGH (ref 0.350–4.500)

## 2016-10-27 LAB — POCT RAPID STREP A: Streptococcus, Group A Screen (Direct): NEGATIVE

## 2016-10-27 LAB — PATHOLOGIST SMEAR REVIEW

## 2016-10-27 MED ORDER — POTASSIUM CHLORIDE IN NACL 20-0.9 MEQ/L-% IV SOLN
INTRAVENOUS | Status: DC
Start: 2016-10-27 — End: 2016-10-28
  Administered 2016-10-27 – 2016-10-28 (×3): via INTRAVENOUS
  Filled 2016-10-27 (×5): qty 1000

## 2016-10-27 MED ORDER — ONDANSETRON HCL 4 MG PO TABS: 4.0000 mg | ORAL_TABLET | Freq: Four times a day (QID) | ORAL | Status: DC | PRN

## 2016-10-27 MED ORDER — ACETAMINOPHEN 325 MG PO TABS: 650.0000 mg | ORAL_TABLET | Freq: Four times a day (QID) | ORAL | Status: DC | PRN

## 2016-10-27 MED ORDER — ACYCLOVIR 200 MG PO CAPS
400.0000 mg | ORAL_CAPSULE | Freq: Three times a day (TID) | ORAL | Status: DC | PRN
  Administered 2016-10-27: 400 mg via ORAL
  Filled 2016-10-27 (×2): qty 2

## 2016-10-27 MED ORDER — ENOXAPARIN SODIUM 40 MG/0.4ML ~~LOC~~ SOLN: 40.0000 mg | SUBCUTANEOUS | Status: DC

## 2016-10-27 MED ORDER — ADULT MULTIVITAMIN W/MINERALS CH
1.0000 | ORAL_TABLET | Freq: Every day | ORAL | Status: DC
  Administered 2016-10-27 – 2016-10-30 (×4): 1 via ORAL
  Filled 2016-10-27 (×4): qty 1

## 2016-10-27 MED ORDER — LISINOPRIL 10 MG PO TABS
10.0000 mg | ORAL_TABLET | Freq: Every day | ORAL | Status: DC
  Administered 2016-10-28: 09:00:00 10 mg via ORAL
  Filled 2016-10-27 (×4): qty 1

## 2016-10-27 MED ORDER — DOCUSATE SODIUM 100 MG PO CAPS
100.0000 mg | ORAL_CAPSULE | Freq: Two times a day (BID) | ORAL | Status: DC
  Administered 2016-10-27 – 2016-10-30 (×4): 100 mg via ORAL
  Filled 2016-10-27 (×8): qty 1

## 2016-10-27 MED ORDER — ACETAMINOPHEN 650 MG RE SUPP: 650.0000 mg | Freq: Four times a day (QID) | RECTAL | Status: DC | PRN

## 2016-10-27 MED ORDER — HYDROCHLOROTHIAZIDE 25 MG PO TABS
25.0000 mg | ORAL_TABLET | Freq: Every day | ORAL | Status: DC
  Administered 2016-10-28: 09:00:00 25 mg via ORAL
  Filled 2016-10-27 (×3): qty 1

## 2016-10-27 MED ORDER — ONDANSETRON HCL 4 MG/2ML IJ SOLN
4.0000 mg | Freq: Four times a day (QID) | INTRAMUSCULAR | Status: DC | PRN
  Administered 2016-10-28 – 2016-10-29 (×2): 4 mg via INTRAVENOUS
  Filled 2016-10-27: qty 2

## 2016-10-27 MED ORDER — LEVOTHYROXINE SODIUM 100 MCG PO TABS
100.0000 ug | ORAL_TABLET | Freq: Every day | ORAL | Status: DC
  Administered 2016-10-27 – 2016-10-30 (×4): 100 ug via ORAL
  Filled 2016-10-27 (×4): qty 1

## 2016-10-27 NOTE — Progress Notes (Signed)
A&O spanish speaking pt admitted to room 102 and then transferred to room 128 for airborne and contact isolation. Pt reports travel to [place] within the last 21 days and presented to ED with body aches and sore throat. Precautions initiated and ordered. Pt and daughter provided education concerning isolation precautions.

## 2016-10-27 NOTE — Progress Notes (Signed)
Chaplain responded to an OR requisition for AD. CH met with pt and pt's daughter at bedside. However, [place] could not educate pt because pt speaks [place]. [place] informed unit secretary to place in a request for an interpretor to schedule an appointment for tomorrow morning at 10:00am. [place] to follow up pt as needed tomorrow.    10/27/16 1500  Clinical Encounter Type  Visited With Patient and family together  Visit Type Initial;Other (Comment)  Referral From Nurse  Consult/Referral To Chaplain  Spiritual Encounters  Spiritual Needs Other (Comment)

## 2016-10-27 NOTE — Progress Notes (Signed)
Pt is on airborne and contact precautions due to recent travel history.  I have checked WHO sites and there are no outbreaks in [place] that would indicate that she needs these precautions.  Please change precautions to Droplet (based on Influenza Like Illness) and discontinue airborne and contact.  Hubert Azure, RN, MSN, CIC

## 2016-10-27 NOTE — Consult Note (Signed)
[facility]  [phone] [phone]  ID: Wanda Reed OB: 05/24/1955  MR#: [id number]  CSN#:[id number]  Patient Care Team: Patel, Sarah, MD as PCP - General (Family Medicine) Lambert, Sheena M, RN as Registered Nurse Shaver, Anne F, RN as Registered Nurse Byrnett, Jeffrey W, MD (General Surgery)  CHIEF COMPLAINT: Fever and myalgias with leukopenia.  INTERVAL HISTORY: Patient is a 61-year-old female who received her first dose of chemotherapy for breast cancer last Thursday who presented to the hospital with a 1-2 day history of fevers and myalgias. She also is complaining of significant bone pain. Patient has improved since admission, but not back to her baseline. She has no neurologic complaints. She has a fair appetite, but denies weight loss. She has no chest pain or shortness of breath. She denies any nausea, vomiting, constipation, or diarrhea. She has no urinary complaints. Patient offers no further specific complaints.  REVIEW OF SYSTEMS:   Review of Systems  Constitutional: Positive for fever and malaise/fatigue. Negative for weight loss.  Respiratory: Negative.  Negative for cough and shortness of breath.   Cardiovascular: Negative.  Negative for chest pain and leg swelling.  Gastrointestinal: Negative.  Negative for abdominal pain, nausea and vomiting.  Genitourinary: Negative.  Negative for dysuria.  Musculoskeletal: Positive for myalgias.  Skin: Negative.  Negative for rash.  Neurological: Positive for weakness. Negative for sensory change and focal weakness.  Psychiatric/Behavioral: Negative.  The patient is not nervous/anxious.     As per HPI. Otherwise, a complete review of systems is negative.  PAST MEDICAL HISTORY: Past Medical History:  Diagnosis Date  . Anxiety   . Breast cancer of upper-outer quadrant of right female breast (HCC) 08/2016   1.0 cm ER+, PR-, Her 2 neu not overexpressed. Node negative. T1b, N0.  . Cancer  (HCC)    cervical  . Hypertension   . Thyroid disease     PAST SURGICAL HISTORY: Past Surgical History:  Procedure Laterality Date  . ABDOMINAL HYSTERECTOMY    . BREAST LUMPECTOMY Right 08/28/2016  . BREAST LUMPECTOMY WITH SENTINEL LYMPH NODE BIOPSY Right 08/28/2016   Procedure: BREAST LUMPECTOMY WITH SENTINEL LYMPH NODE BX;  Surgeon: Byrnett, Jeffrey W, MD;  Location: [facility] ORS;  Service: General;  Laterality: Right;  . BREAST MAMMOSITE Right 09/15/2016   Procedure: MAMMOSITE BREAST;  Surgeon: Byrnett, Jeffrey W, MD;  Location: [facility] ORS;  Service: General;  Laterality: Right;  . BREAST SURGERY Right    Breast Biopsy  . CERVIX SURGERY    . COLONOSCOPY  2008  . EYE SURGERY Bilateral    Cataract Extraction with IOL  . SHOULDER ARTHROSCOPY Right     FAMILY HISTORY: Family History  Problem Relation Age of Onset  . Breast cancer Neg Hx     ADVANCED DIRECTIVES (Y/N):  @ADVDIR@  HEALTH MAINTENANCE: Social History  Substance Use Topics  . Smoking status: Never Smoker  . Smokeless tobacco: Never Used  . Alcohol use No     Colonoscopy:  PAP:  Bone density:  Lipid panel:  No Known Allergies  Current Facility-Administered Medications  Medication Dose Route Frequency Provider Last Rate Last Dose  . 0.9 % NaCl with KCl 20 mEq/ L  infusion   Intravenous Continuous Diamond, Michael S, MD 100 mL/hr at 10/27/16 1640    . acetaminophen (TYLENOL) tablet 650 mg  650 mg Oral Q6H PRN Diamond, Michael S, MD       Or  . acetaminophen (TYLENOL) suppository 650 mg  650 mg Rectal   Q6H PRN Harrie Foreman, MD      . acyclovir (ZOVIRAX) 200 MG capsule 400 mg  400 mg Oral TID PRN Harrie Foreman, MD   400 mg at 10/27/16 0545  . docusate sodium (COLACE) capsule 100 mg  100 mg Oral BID Harrie Foreman, MD   100 mg at 10/27/16 1856  . hydrochlorothiazide (HYDRODIURIL) tablet 25 mg  25 mg Oral Daily Harrie Foreman, MD      . levothyroxine (SYNTHROID, LEVOTHROID) tablet 100 mcg  100 mcg  Oral QAC breakfast Harrie Foreman, MD   100 mcg at 10/27/16 0933  . lisinopril (PRINIVIL,ZESTRIL) tablet 10 mg  10 mg Oral Daily Harrie Foreman, MD      . morphine 4 MG/ML injection 4 mg  4 mg Intravenous Q3H PRN Merlyn Lot, MD   4 mg at 10/27/16 1628  . multivitamin with minerals tablet 1 tablet  1 tablet Oral Daily Hillary Bow, MD   1 tablet at 10/27/16 1407  . ondansetron (ZOFRAN) tablet 4 mg  4 mg Oral Q6H PRN Harrie Foreman, MD       Or  . ondansetron [facility]) injection 4 mg  4 mg Intravenous Q6H PRN Harrie Foreman, MD      . piperacillin-tazobactam (ZOSYN) IVPB 3.375 g  3.375 g Intravenous Q8H Hallaji, Sheema M, RPH 12.5 mL/hr at 10/27/16 1407 3.375 g at 10/27/16 1407  . promethazine (PHENERGAN) injection 12.5 mg  12.5 mg Intravenous Q6H PRN Merlyn Lot, MD   12.5 mg at 10/26/16 1914  . vancomycin (VANCOCIN) IVPB 750 mg/150 ml premix  750 mg Intravenous Q12H Hallaji, Dani Gobble, RPH 150 mL/hr at 10/27/16 1641 750 mg at 10/27/16 1641    OBJECTIVE: Vitals:   10/27/16 0938 10/27/16 1207  BP: (!) 106/49 (!) 117/50  Pulse: 94 90  Resp: 16 18  Temp: 98.2 F (36.8 C) 98.1 F (36.7 C)  SpO2: 99% 97%     Body mass index is 32.5 kg/m.    ECOG FS:1 - Symptomatic but completely ambulatory  General: Well-developed, well-nourished, no acute distress. Eyes: Pink conjunctiva, anicteric sclera. HEENT: Normocephalic, moist mucous membranes, clear oropharnyx. Lungs: Clear to auscultation bilaterally. Heart: Regular rate and rhythm. No rubs, murmurs, or gallops. Abdomen: Soft, nontender, nondistended. No organomegaly noted, normoactive bowel sounds. Musculoskeletal: No edema, cyanosis, or clubbing. Neuro: Alert, answering all questions appropriately. Cranial nerves grossly intact. Skin: No rashes or petechiae noted. Psych: Normal affect. Lymphatics: No cervical, calvicular, axillary or inguinal LAD.   LAB RESULTS:  Lab Results  Component Value Date   NA 129  (L) 10/26/2016   K 3.4 (L) 10/26/2016   CL 94 (L) 10/26/2016   CO2 26 10/26/2016   GLUCOSE 133 (H) 10/26/2016   BUN 10 10/26/2016   CREATININE 0.60 10/26/2016   CALCIUM 8.7 (L) 10/26/2016   PROT 7.2 10/26/2016   ALBUMIN 3.7 10/26/2016   AST 31 10/26/2016   ALT 18 10/26/2016   ALKPHOS 85 10/26/2016   BILITOT 1.1 10/26/2016   GFRNONAA >60 10/26/2016   GFRAA >60 10/26/2016    Lab Results  Component Value Date   WBC 2.1 (L) 10/26/2016   NEUTROABS 1.5 10/26/2016   HGB 13.1 10/26/2016   HCT 38.1 10/26/2016   MCV 93.3 10/26/2016   PLT 64 (L) 10/26/2016     STUDIES: Dg Chest 2 View  Result Date: 10/26/2016 CLINICAL DATA:  61 year old female with history of body aches, headache and sore throat for the past 3  days. Undergoing chemotherapy for breast cancer. EXAM: CHEST  2 VIEW COMPARISON:  Chest x-ray 08/28/2016. FINDINGS: Lung volumes are normal. No consolidative airspace disease. No pleural effusions. No pneumothorax. No pulmonary nodule or mass noted. Pulmonary vasculature and the cardiomediastinal silhouette are within normal limits. IMPRESSION: No radiographic evidence of acute cardiopulmonary disease. Electronically Signed   By: Vinnie Langton M.D.   On: 10/26/2016 20:28   Ct Soft Tissue Neck W Contrast  Result Date: 10/26/2016 CLINICAL DATA:  Neck pain.  Current treatment for breast cancer EXAM: CT NECK WITH CONTRAST TECHNIQUE: Multidetector CT imaging of the neck was performed using the standard protocol following the bolus administration of intravenous contrast. CONTRAST:  67m ISOVUE-300 IOPAMIDOL (ISOVUE-300) INJECTION 61% COMPARISON:  None. FINDINGS: Pharynx and larynx: Normal. No mass or swelling. Salivary glands: No inflammation, mass, or stone. Thyroid: Small thyroid gland bilaterally without mass lesion. Lymph nodes: Negative Vascular: Negative Limited intracranial: Negative Visualized orbits: Negative Mastoids and visualized paranasal sinuses: Negative Skeleton: Negative  for fracture or metastatic disease. Upper chest: Negative Other: None IMPRESSION: Negative CT neck Electronically Signed   By: CFranchot GalloM.D.   On: 10/26/2016 20:22    ASSESSMENT: Fever and myalgias with leukopenia.   PLAN:    1. Breast cancer: Patient received chemotherapy with Cytoxan and Taxotere on October 22, 2016. She received Neulasta on October 23, 2016. Patient has been instructed to keep her previously scheduled follow-up appointment on October 29, 2016. Her next treatment is scheduled for November 12, 2016. 2. Leukopenia: Secondary to chemotherapy. Patient received Neulasta as above. 3. Thrombocytopenia: Secondary chemotherapy. No intervention needed. 4. Myalgias/bony pain: Secondary to Neulasta. Okay to take Tylenol for symptomatic relief. 5. Fevers: No obvious infectious source. Continue current antibiotics. 6. Disposition: Possible discharge home tomorrow if patient is afebrile. [place].  Appreciate consult, call with questions.   Cancer Staging Malignant neoplasm of upper-outer quadrant of right breast in female, estrogen receptor positive ([place] Staging form: Breast, AJCC 8th Edition - Pathologic stage from 10/01/2016: Stage IA (pT1b, pN0, cM0, G2, ER: Positive, PR: Negative, HER2: Negative) - Signed by FLloyd Huger MD on 10/01/2016   TLloyd Huger MD   10/27/2016 4:56 PM

## 2016-10-27 NOTE — H&P (Signed)
Wanda Reed is an 61 y.o. female.   Chief Complaint: Body aches HPI: The patient with past medical history of breast cancer status post chemotherapy 4 days ago presents to the emergency department complaining of fever and body aches. The patient states that she's felt bad for the last 3 days. She's been unable to eat but denies nausea or vomiting. She also denies shortness of breath. She admits that her stools are loose but she has not had bowel movements lately because she has not been eating. Laboratory evaluation in the emergency department revealed leukopenia. She was given broad-spectrum antibiotics after blood cultures but due to her immunocompromised state and possible sepsis the emergency department staff called the hospitalist service for admission.  Past Medical History:  Diagnosis Date  . Anxiety   . Breast cancer of upper-outer quadrant of right female breast ([place]) 08/2016   1.0 cm ER+, PR-, Her 2 neu not overexpressed. Node negative. T1b, N0.  [name] Cancer (HCC)    cervical  . Hypertension   . Thyroid disease     Past Surgical History:  Procedure Laterality Date  . ABDOMINAL HYSTERECTOMY    . BREAST LUMPECTOMY Right 08/28/2016  . BREAST LUMPECTOMY WITH SENTINEL LYMPH NODE BIOPSY Right 08/28/2016   Procedure: BREAST LUMPECTOMY WITH SENTINEL LYMPH NODE BX;  Surgeon: [name], MD;  Location: [facility] ORS;  Service: General;  Laterality: Right;  . BREAST MAMMOSITE Right 09/15/2016   Procedure: MAMMOSITE BREAST;  Surgeon: [name], MD;  Location: [facility] ORS;  Service: General;  Laterality: Right;  . BREAST SURGERY Right    Breast Biopsy  . CERVIX SURGERY    . COLONOSCOPY  2008  . EYE SURGERY Bilateral    Cataract Extraction with IOL  . SHOULDER ARTHROSCOPY Right     Family History  Problem Relation Age of Onset  . Breast cancer Neg Hx    Social History:  reports that she has never smoked. She has never used smokeless tobacco. She reports that she does not drink  alcohol or use drugs.  Allergies: No Known Allergies  Medications Prior to Admission  Medication Sig Dispense Refill  . acetaminophen (TYLENOL) 500 MG tablet Take 1,000 mg by mouth every 6 (six) hours as needed (for pain.).    [name] acyclovir (ZOVIRAX) 400 MG tablet Take 400 mg by mouth 3 (three) times daily as needed (cold sore outbreaks).     . hydrochlorothiazide (HYDRODIURIL) 25 MG tablet Take 25 mg by mouth daily.    [name] levothyroxine (SYNTHROID, LEVOTHROID) 100 MCG tablet Take 100 mcg by mouth daily before breakfast.    . lisinopril (PRINIVIL,ZESTRIL) 10 MG tablet Take 10 mg by mouth daily.    . prochlorperazine (COMPAZINE) 10 MG tablet Take 1 tablet (10 mg total) by mouth every 6 (six) hours as needed (Nausea or vomiting). 30 tablet 2  . ondansetron (ZOFRAN) 8 MG tablet Take 1 tablet (8 mg total) by mouth 2 (two) times daily as needed for refractory nausea / vomiting. 30 tablet 2    Results for orders placed or performed during the hospital encounter of 10/26/16 (from the past 48 hour(s))  Comprehensive metabolic panel     Status: Abnormal   Collection Time: 10/26/16  6:50 PM  Result Value Ref Range   Sodium 129 (L) 135 - 145 mmol/L   Potassium 3.4 (L) 3.5 - 5.1 mmol/L   Chloride 94 (L) 101 - 111 mmol/L   CO2 26 22 - 32 mmol/L   Glucose, Bld 133 (  H) 65 - 99 mg/dL   BUN 10 6 - 20 mg/dL   Creatinine, Ser 0.60 0.44 - 1.00 mg/dL   Calcium 8.7 (L) 8.9 - 10.3 mg/dL   Total Protein 7.2 6.5 - 8.1 g/dL   Albumin 3.7 3.5 - 5.0 g/dL   AST 31 15 - 41 U/L   ALT 18 14 - 54 U/L   Alkaline Phosphatase 85 38 - 126 U/L   Total Bilirubin 1.1 0.3 - 1.2 mg/dL   GFR calc non Af Amer >60 >60 mL/min   GFR calc Af Amer >60 >60 mL/min    Comment: (NOTE) The eGFR has been calculated using the CKD EPI equation. This calculation has not been validated in all clinical situations. eGFR's persistently <60 mL/min signify possible Chronic Kidney Disease.    Anion gap 9 5 - 15  CBC WITH DIFFERENTIAL      Status: Abnormal   Collection Time: 10/26/16  6:50 PM  Result Value Ref Range   WBC 2.1 (L) 3.6 - 11.0 K/uL   RBC 4.08 3.80 - 5.20 MIL/uL   Hemoglobin 13.1 12.0 - 16.0 g/dL   HCT 38.1 35.0 - 47.0 %   MCV 93.3 80.0 - 100.0 fL   MCH 32.0 26.0 - 34.0 pg   MCHC 34.3 32.0 - 36.0 g/dL   RDW 12.3 11.5 - 14.5 %   Platelets 64 (L) 150 - 440 K/uL    Comment: PLATELET CLUMPS NOTED ON SMEAR, COUNT APPEARS ADEQUATE   Neutrophils Relative % 66 %   Lymphocytes Relative 21 %   Monocytes Relative 4 %   Eosinophils Relative 0 %   Basophils Relative 0 %   Band Neutrophils 7 %   Metamyelocytes Relative 0 %   Myelocytes 0 %   Promyelocytes Absolute 0 %   Blasts 0 %   nRBC 0 0 /100 WBC   Other 2 %   Neutro Abs 1.5 1.4 - 6.5 K/uL   Lymphs Abs 0.4 (L) 1.0 - 3.6 K/uL   Monocytes Absolute 0.1 (L) 0.2 - 0.9 K/uL   Eosinophils Absolute 0.0 0 - 0.7 K/uL   Basophils Absolute 0.0 0 - 0.1 K/uL   RBC Morphology MIXED RBC POPULATION    Smear Review      PATH REVIEW HAS BEEN ORDERED ON THIS ACCESSION NUMBER  TSH     Status: Abnormal   Collection Time: 10/26/16  6:50 PM  Result Value Ref Range   TSH 6.350 (H) 0.350 - 4.500 uIU/mL    Comment: Performed by a 3rd Generation assay with a functional sensitivity of <=0.01 uIU/mL.  Lactic acid, plasma     Status: None   Collection Time: 10/26/16  6:53 PM  Result Value Ref Range   Lactic Acid, Venous 1.6 0.5 - 1.9 mmol/L  Urinalysis, Routine w reflex microscopic     Status: Abnormal   Collection Time: 10/26/16  8:58 PM  Result Value Ref Range   Color, Urine COLORLESS (A) YELLOW   APPearance CLEAR (A) CLEAR   Specific Gravity, Urine 1.018 1.005 - 1.030   pH 7.0 5.0 - 8.0   Glucose, UA NEGATIVE NEGATIVE mg/dL   Hgb urine dipstick NEGATIVE NEGATIVE   Bilirubin Urine NEGATIVE NEGATIVE   Ketones, ur NEGATIVE NEGATIVE mg/dL   Protein, ur NEGATIVE NEGATIVE mg/dL   Nitrite NEGATIVE NEGATIVE   Leukocytes, UA NEGATIVE NEGATIVE   Dg Chest 2 View  Result Date:  10/26/2016 CLINICAL DATA:  61 year old female with history of body aches, headache and  sore throat for the past 3 days. Undergoing chemotherapy for breast cancer. EXAM: CHEST  2 VIEW COMPARISON:  Chest x-ray 08/28/2016. FINDINGS: Lung volumes are normal. No consolidative airspace disease. No pleural effusions. No pneumothorax. No pulmonary nodule or mass noted. Pulmonary vasculature and the cardiomediastinal silhouette are within normal limits. IMPRESSION: No radiographic evidence of acute cardiopulmonary disease. Electronically Signed   By: Vinnie Langton M.D.   On: 10/26/2016 20:28   Ct Soft Tissue Neck W Contrast  Result Date: 10/26/2016 CLINICAL DATA:  Neck pain.  Current treatment for breast cancer EXAM: CT NECK WITH CONTRAST TECHNIQUE: Multidetector CT imaging of the neck was performed using the standard protocol following the bolus administration of intravenous contrast. CONTRAST:  7m ISOVUE-300 IOPAMIDOL (ISOVUE-300) INJECTION 61% COMPARISON:  None. FINDINGS: Pharynx and larynx: Normal. No mass or swelling. Salivary glands: No inflammation, mass, or stone. Thyroid: Small thyroid gland bilaterally without mass lesion. Lymph nodes: Negative Vascular: Negative Limited intracranial: Negative Visualized orbits: Negative Mastoids and visualized paranasal sinuses: Negative Skeleton: Negative for fracture or metastatic disease. Upper chest: Negative Other: None IMPRESSION: Negative CT neck Electronically Signed   By: CFranchot GalloM.D.   On: 10/26/2016 20:22    Review of Systems  Constitutional: Positive for fever. Negative for chills.  HENT: Negative for sore throat and tinnitus.   Eyes: Negative for blurred vision and redness.  Respiratory: Negative for cough and shortness of breath.   Cardiovascular: Negative for chest pain, palpitations, orthopnea and PND.  Gastrointestinal: Negative for abdominal pain, diarrhea, nausea and vomiting.  Genitourinary: Negative for dysuria, frequency and urgency.   Musculoskeletal: Positive for back pain and myalgias. Negative for joint pain.  Skin: Negative for rash.       No lesions  Neurological: Positive for weakness. Negative for speech change and focal weakness.  Endo/Heme/Allergies: Does not bruise/bleed easily.       No temperature intolerance  Psychiatric/Behavioral: Negative for depression and suicidal ideas.    Blood pressure (!) 124/53, pulse (!) 105, temperature 100.3 F (37.9 C), temperature source Oral, resp. rate (!) 24, height 5' (1.524 m), weight 75.4 kg (166 lb 3.2 oz), SpO2 99 %. Physical Exam  Vitals reviewed. Constitutional: She is oriented to person, place, and time. She appears well-developed and well-nourished. No distress.  HENT:  Head: Normocephalic and atraumatic.  Mouth/Throat: Oropharynx is clear and moist.  Eyes: Pupils are equal, round, and reactive to light. Conjunctivae and EOM are normal. No scleral icterus.  Neck: Normal range of motion. Neck supple. No JVD present. No tracheal deviation present. No thyromegaly present.  Cardiovascular: Normal rate, regular rhythm and normal heart sounds.  Exam reveals no gallop and no friction rub.   No murmur heard. Respiratory: Effort normal and breath sounds normal.  GI: Soft. Bowel sounds are normal. She exhibits no distension. There is no tenderness.  Genitourinary:  Genitourinary Comments: Deferred  Musculoskeletal: Normal range of motion. She exhibits no edema.  Lymphadenopathy:    She has no cervical adenopathy.  Neurological: She is alert and oriented to person, place, and time. No cranial nerve deficit. She exhibits normal muscle tone.  Skin: Skin is warm and dry. No rash noted. No erythema.  Psychiatric: She has a normal mood and affect. Her behavior is normal. Judgment and thought content normal.     Assessment/Plan This is a 61year old female admitted for sepsis. 1. Sepsis: The patient meets criteria via tachypnea, tachycardia and leukopenia. Blood cultures  obtained. Follow for growth and sensitivities. Continue Vanco and  Zosyn. The patient is hemodynamically stable 2. Hypertension: Controlled; continue chlorothiazide and lisinopril 3. Hypothyroidism: Check TSH; continue Synthroid 4. Breast cancer: Currently undergoing chemotherapy; continue acyclovir prophylaxis 5. DVT prophylaxis: Lovenox 6. GI prophylaxis: None The patient is a full code. Time spent on admission orders and patient care approximately 45 minutes  Harrie Foreman, MD 10/27/2016, 3:34 AM

## 2016-10-27 NOTE — Progress Notes (Deleted)
[place]  [phone] [phone]  ID: Wanda Reed OB: 1955/12/11  MR#: [id number]  AOZ#:[id number]  Patient Care Team: Denton Lank, MD as PCP - General (Family Medicine) Rico Junker, RN as Registered Nurse Theodore Demark, RN as Registered Nurse Bary Castilla Forest Gleason, MD (General Surgery)  CHIEF COMPLAINT: Pathologic stage IA ER positive, PR/HER-2 negative, invasive carcinoma of the upper outer quadrant of the right breast. High risk MammaPrint.  INTERVAL HISTORY: Patient returns to clinic today for further evaluation and consideration of cycle 1 of Taxotere and Cytoxan. She is anxious, but otherwise feels well. She has no neurologic complaints. She denies any recent fevers or illnesses. She has a good appetite and denies weight loss. She has no chest pain or shortness of breath. She denies any nausea, vomiting, constipation, or diarrhea. She has no urinary complaints. Patient offers no further specific complaints today.  REVIEW OF SYSTEMS:   Review of Systems  Constitutional: Negative.  Negative for fever, malaise/fatigue and weight loss.  Respiratory: Negative.  Negative for cough and shortness of breath.   Cardiovascular: Negative.  Negative for chest pain and leg swelling.  Gastrointestinal: Negative.  Negative for abdominal pain.  Genitourinary: Negative.   Musculoskeletal: Negative.   Skin: Negative.  Negative for rash.  Neurological: Negative.  Negative for weakness.  Psychiatric/Behavioral: The patient is nervous/anxious.     As per HPI. Otherwise, a complete review of systems is negative.  PAST MEDICAL HISTORY: Past Medical History:  Diagnosis Date  . Anxiety   . Breast cancer of upper-outer quadrant of right female breast ([place]) 08/2016   1.0 cm ER+, PR-, Her 2 neu not overexpressed. Node negative. T1b, N0.  [name] Cancer (HCC)    cervical  . Hypertension   . Thyroid disease     PAST SURGICAL HISTORY: Past Surgical  History:  Procedure Laterality Date  . ABDOMINAL HYSTERECTOMY    . BREAST LUMPECTOMY Right 08/28/2016  . BREAST LUMPECTOMY WITH SENTINEL LYMPH NODE BIOPSY Right 08/28/2016   Procedure: BREAST LUMPECTOMY WITH SENTINEL LYMPH NODE BX;  Surgeon: [name], MD;  Location: [facility] ORS;  Service: General;  Laterality: Right;  . BREAST MAMMOSITE Right 09/15/2016   Procedure: MAMMOSITE BREAST;  Surgeon: [name], MD;  Location: [facility] ORS;  Service: General;  Laterality: Right;  . BREAST SURGERY Right    Breast Biopsy  . CERVIX SURGERY    . COLONOSCOPY  2008  . EYE SURGERY Bilateral    Cataract Extraction with IOL  . SHOULDER ARTHROSCOPY Right     FAMILY HISTORY: Family History  Problem Relation Age of Onset  . Breast cancer Neg Hx     ADVANCED DIRECTIVES (Y/N):  N  HEALTH MAINTENANCE: Social History  Substance Use Topics  . Smoking status: Never Smoker  . Smokeless tobacco: Never Used  . Alcohol use No     Colonoscopy:  PAP:  Bone density:  Lipid panel:  No Known Allergies  No current facility-administered medications for this visit.    No current outpatient prescriptions on file.   Facility-Administered Medications Ordered in Other Visits  Medication Dose Route Frequency Provider Last Rate Last Dose  . 0.9 % NaCl with KCl 20 mEq/ L  infusion   Intravenous Continuous Harrie Foreman, MD 100 mL/hr at 10/27/16 1640    . acetaminophen (TYLENOL) tablet 650 mg  650 mg Oral Q6H PRN Harrie Foreman, MD       Or  . acetaminophen (TYLENOL) suppository 650 mg  650 mg Rectal Q6H PRN Harrie Foreman, MD      . acyclovir (ZOVIRAX) 200 MG capsule 400 mg  400 mg Oral TID PRN Harrie Foreman, MD   400 mg at 10/27/16 0545  . docusate sodium (COLACE) capsule 100 mg  100 mg Oral BID Harrie Foreman, MD   100 mg at 10/27/16 2134  . hydrochlorothiazide (HYDRODIURIL) tablet 25 mg  25 mg Oral Daily Harrie Foreman, MD      . levothyroxine (SYNTHROID, LEVOTHROID)  tablet 100 mcg  100 mcg Oral QAC breakfast Harrie Foreman, MD   100 mcg at 10/27/16 0933  . lisinopril (PRINIVIL,ZESTRIL) tablet 10 mg  10 mg Oral Daily Harrie Foreman, MD      . morphine 4 MG/ML injection 4 mg  4 mg Intravenous Q3H PRN Merlyn Lot, MD   4 mg at 10/27/16 2129  . multivitamin with minerals tablet 1 tablet  1 tablet Oral Daily Hillary Bow, MD   1 tablet at 10/27/16 1407  . ondansetron (ZOFRAN) tablet 4 mg  4 mg Oral Q6H PRN Harrie Foreman, MD       Or  . ondansetron [facility]) injection 4 mg  4 mg Intravenous Q6H PRN Harrie Foreman, MD      . piperacillin-tazobactam (ZOSYN) IVPB 3.375 g  3.375 g Intravenous Q8H Hallaji, Sheema M, RPH 12.5 mL/hr at 10/27/16 2134 3.375 g at 10/27/16 2134  . promethazine (PHENERGAN) injection 12.5 mg  12.5 mg Intravenous Q6H PRN Merlyn Lot, MD   12.5 mg at 10/26/16 1914  . vancomycin (VANCOCIN) IVPB 750 mg/150 ml premix  750 mg Intravenous Q12H Hallaji, Dani Gobble, RPH 150 mL/hr at 10/27/16 1641 750 mg at 10/27/16 1641    OBJECTIVE: There were no vitals filed for this visit.   There is no height or weight on file to calculate BMI.    ECOG FS:0 - Asymptomatic  General: Well-developed, well-nourished, no acute distress. Eyes: Pink conjunctiva, anicteric sclera. Breasts: Exam deferred today. Lungs: Clear to auscultation bilaterally. Heart: Regular rate and rhythm. No rubs, murmurs, or gallops. Abdomen: Soft, nontender, nondistended. No organomegaly noted, normoactive bowel sounds. Musculoskeletal: No edema, cyanosis, or clubbing. Neuro: Alert, answering all questions appropriately. Cranial nerves grossly intact. Skin: No rashes or petechiae noted. Psych: Normal affect.  LAB RESULTS:  Lab Results  Component Value Date   NA 129 (L) 10/26/2016   K 3.4 (L) 10/26/2016   CL 94 (L) 10/26/2016   CO2 26 10/26/2016   GLUCOSE 133 (H) 10/26/2016   BUN 10 10/26/2016   CREATININE 0.60 10/26/2016   CALCIUM 8.7 (L) 10/26/2016    PROT 7.2 10/26/2016   ALBUMIN 3.7 10/26/2016   AST 31 10/26/2016   ALT 18 10/26/2016   ALKPHOS 85 10/26/2016   BILITOT 1.1 10/26/2016   GFRNONAA >60 10/26/2016   GFRAA >60 10/26/2016    Lab Results  Component Value Date   WBC 2.1 (L) 10/26/2016   NEUTROABS 1.5 10/26/2016   HGB 13.1 10/26/2016   HCT 38.1 10/26/2016   MCV 93.3 10/26/2016   PLT 64 (L) 10/26/2016     STUDIES: Dg Chest 2 View  Result Date: 10/26/2016 CLINICAL DATA:  61 year old female with history of body aches, headache and sore throat for the past 3 days. Undergoing chemotherapy for breast cancer. EXAM: CHEST  2 VIEW COMPARISON:  Chest x-ray 08/28/2016. FINDINGS: Lung volumes are normal. No consolidative airspace disease. No pleural effusions. No pneumothorax. No pulmonary nodule or mass noted.  Pulmonary vasculature and the cardiomediastinal silhouette are within normal limits. IMPRESSION: No radiographic evidence of acute cardiopulmonary disease. Electronically Signed   By: Vinnie Langton M.D.   On: 10/26/2016 20:28   Ct Soft Tissue Neck W Contrast  Result Date: 10/26/2016 CLINICAL DATA:  Neck pain.  Current treatment for breast cancer EXAM: CT NECK WITH CONTRAST TECHNIQUE: Multidetector CT imaging of the neck was performed using the standard protocol following the bolus administration of intravenous contrast. CONTRAST:  37m ISOVUE-300 IOPAMIDOL (ISOVUE-300) INJECTION 61% COMPARISON:  None. FINDINGS: Pharynx and larynx: Normal. No mass or swelling. Salivary glands: No inflammation, mass, or stone. Thyroid: Small thyroid gland bilaterally without mass lesion. Lymph nodes: Negative Vascular: Negative Limited intracranial: Negative Visualized orbits: Negative Mastoids and visualized paranasal sinuses: Negative Skeleton: Negative for fracture or metastatic disease. Upper chest: Negative Other: None IMPRESSION: Negative CT neck Electronically Signed   By: CFranchot GalloM.D.   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 High risk MammaPrint.  PLAN:    1. 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 High risk MammaPrint. Patient had lumpectomy on August 28, 2016 followed by MammoSite radiation therapy. Given her high risk MammaPrint, have recommended adjuvant chemotherapy. Proceed with cycle 1 of 4 of Taxotere and Cytoxan. She will require Neulasta support. She does not require port at this time, but will consider one in the future if IV access becomes difficult. At the conclusion of all her treatments, patient will benefit from an aromatase inhibitor given the ER status of her tumor. Return in 1 week for repeat laboratory work and further evaluation and then in 3 weeks for consideration of cycle 2.  2. Thrombocytopenia: Mild, monitor.  Approximately 30 minutes was spent in discussion of which greater than 50% was consultation.  Patient's daughter acted as an [employer].  Patient expressed understanding and was in agreement with this plan. She also understands that She can call clinic at any time with any questions, concerns, or complaints.   Cancer Staging Malignant neoplasm of upper-outer quadrant of right breast in female, estrogen receptor positive ([place] Staging form: Breast, AJCC 8th Edition - Pathologic stage from 10/01/2016: Stage IA (pT1b, pN0, cM0, G2, ER: Positive, PR: Negative, HER2: Negative) - Signed by FLloyd Huger MD on 10/01/2016   TLloyd Huger MD   10/27/2016 11:51 PM

## 2016-10-27 NOTE — Progress Notes (Signed)
Initial Nutrition Assessment  DOCUMENTATION CODES:   Obesity unspecified  INTERVENTION:  Provide Carnation Instant Breakfast in whole milk TID with meals, each supplement provides 280 kcal and 13 grams of protein.  Provide daily multivitamin with minerals.  Encouraged ongoing intake of small, frequent meals. Discussed that since patient is taking in such small amounts at meals, the foods she chooses should be protein- and calorie-dense. Encouraged her to eat a source of protein at each of her small meals (soft-cooked chicken and other meats, beans/legumes, milk and other dairy products). Discussed importance of adequate intake to prevent loss of weight during treatment. Discussed strategies for intake with her mouth/dental pain. Encouraged intake of soft, easy-to-chew foods.  NUTRITION DIAGNOSIS:   Inadequate oral intake related to poor appetite, cancer and cancer related treatments, other (see comment), nausea, vomiting (early satiety) as evidenced by per patient/family report.  GOAL:   Patient will meet greater than or equal to 90% of their needs  MONITOR:   PO intake, Supplement acceptance, Labs, Weight trends, I & O's  REASON FOR ASSESSMENT:   Malnutrition Screening Tool    ASSESSMENT:   61 year old Spanish-speaking female with PMHx of cervical cancer s/p abdominal hysterectomy, HTN, hypothyroidism, anxiety, stage 1A invasive carcinoma of upper outer quadrant of right breast s/p lumpectomy on 08/28/2016 followed by XRT and recently initiated adjuvant chemotherapy (cycle 1 of Taxotere and Cytoxan on 10/22/2016) who presented with fever and body aches and admitted as she meets criteria for sepsis.   Spoke with patient at bedside with assistance from [place] interpreter (Irwing Madrid). Patient reports she has had a poor appetite since she started chemotherapy last week (8/8). She reports her decreased appetite is related to nausea, occasional vomiting, and early satiety at meals. She  has been attempting to eat 5 small meals per day. She reports she is only taking bites at these meals (may have two bites of chicken, bites of vegetables such as green beans). Before starting chemotherapy she used to have a good appetite and could eat two full meals plus snacks between meals (reports she ate "wheat snacks"). Patient also endorses she has had some mouth/dental pain making chewing difficult (currently improved on pain medications). She enjoys dairy products such as milk and yogurt and is amenable to drinking [employer].   Patient's breakfast tray in room. She had only finished grits, one pancake, fruit cup, and orange juice.  Patient reports UBW is around 160 lbs. Noted in chart her weight has been 147-160 lbs this year. Patient currently 166.4 lbs (wt trending up).  Medications reviewed and include: Colace, levothyroxine, NS with KCl 20 mEq/L at 100 ml/hr, Zosyn, vancomycin.  Labs reviewed: on 8/12 Sodium 129, Potassium 3.4, Chloride 94.   Nutrition-Focused physical exam completed. Findings are no fat depletion, no muscle depletion, and no edema.   Patient is at risk for acute malnutrition in setting of energy intake </=50% for >/=5 days per patient report.  Discussed with RN.  Diet Order:  Diet regular Room service appropriate? Yes; Fluid consistency: Thin  Skin:  Reviewed, no issues  Last BM:  10/26/2016  Height:   Ht Readings from Last 1 Encounters:  10/27/16 5' (1.524 m)    Weight:   Wt Readings from Last 1 Encounters:  10/27/16 166 lb 6.4 oz (75.5 kg)    Ideal Body Weight:  45.5 kg  BMI:  Body mass index is 32.5 kg/m.  Estimated Nutritional Needs:   Kcal:  1620-1870 (MSJ x 1.3-1.5)  Protein:  75-90 grams (1-1.2 grams/kg ABW; approximately 1.6-2 grams/kg IBW)  Fluid:  1.6-1.9 L/day (1 ml/kcal)  EDUCATION NEEDS:   Education needs addressed  Willey Blade, MS, RD, LDN Pager: [phone] After Hours Pager: [phone]

## 2016-10-27 NOTE — Progress Notes (Signed)
Patient seen.  History thru [place] interpreter.  Feels better. Myalgias improved.  Afebrile Symptoms likely due to chemo. Continue abx one more day while waiting for cultures. Consult oncology.  Discontinue antibiotics if cultures negative tomorrow. Discontinue isolation.

## 2016-10-28 ENCOUNTER — Ambulatory Visit: Payer: Medicaid Other | Admitting: Oncology

## 2016-10-28 ENCOUNTER — Inpatient Hospital Stay: Payer: Medicaid Other

## 2016-10-28 LAB — CBC WITH DIFFERENTIAL/PLATELET
Basophils Absolute: 0 10*3/uL (ref 0–0.1)
Basophils Relative: 0 %
Eosinophils Absolute: 0 10*3/uL (ref 0–0.7)
Eosinophils Relative: 1 %
HCT: 33.6 % — ABNORMAL LOW (ref 35.0–47.0)
Hemoglobin: 11.5 g/dL — ABNORMAL LOW (ref 12.0–16.0)
Lymphocytes Relative: 39 %
Lymphs Abs: 0.3 10*3/uL — ABNORMAL LOW (ref 1.0–3.6)
MCH: 31.6 pg (ref 26.0–34.0)
MCHC: 34.3 g/dL (ref 32.0–36.0)
MCV: 92.2 fL (ref 80.0–100.0)
Monocytes Absolute: 0.2 10*3/uL (ref 0.2–0.9)
Monocytes Relative: 28 %
Neutro Abs: 0.2 10*3/uL — ABNORMAL LOW (ref 1.4–6.5)
Neutrophils Relative %: 32 %
Platelets: 69 10*3/uL — ABNORMAL LOW (ref 150–440)
RBC: 3.65 MIL/uL — ABNORMAL LOW (ref 3.80–5.20)
RDW: 12.3 % (ref 11.5–14.5)
WBC: 0.7 10*3/uL — CL (ref 3.6–11.0)

## 2016-10-28 LAB — BASIC METABOLIC PANEL
Anion gap: 4 — ABNORMAL LOW (ref 5–15)
BUN: 7 mg/dL (ref 6–20)
CO2: 27 mmol/L (ref 22–32)
Calcium: 8.1 mg/dL — ABNORMAL LOW (ref 8.9–10.3)
Chloride: 100 mmol/L — ABNORMAL LOW (ref 101–111)
Creatinine, Ser: 0.5 mg/dL (ref 0.44–1.00)
GFR calc Af Amer: >60 mL/min (ref >60)
GFR calc non Af Amer: >60 mL/min (ref >60)
Glucose, Bld: 148 mg/dL — ABNORMAL HIGH (ref 65–99)
Potassium: 4 mmol/L (ref 3.5–5.1)
Sodium: 131 mmol/L — ABNORMAL LOW (ref 135–145)

## 2016-10-28 LAB — MISC LABCORP TEST (SEND OUT): Labcorp test code: 1453

## 2016-10-28 MED ORDER — IOPAMIDOL (ISOVUE-300) INJECTION 61%
100.0000 mL | Freq: Once | INTRAVENOUS | Status: AC | PRN
  Administered 2016-10-28: 15:00:00 100 mL via INTRAVENOUS

## 2016-10-28 MED ORDER — IOPAMIDOL (ISOVUE-300) INJECTION 61%: 15.0000 mL | INTRAVENOUS | Status: AC

## 2016-10-28 MED ORDER — MENTHOL 3 MG MT LOZG
1.0000 | LOZENGE | OROMUCOSAL | Status: DC | PRN
  Filled 2016-10-28: qty 9

## 2016-10-28 MED ORDER — OXYCODONE-ACETAMINOPHEN 5-325 MG PO TABS
1.0000 | ORAL_TABLET | Freq: Four times a day (QID) | ORAL | Status: DC | PRN
  Administered 2016-10-29 (×2): 1 via ORAL
  Filled 2016-10-28 (×2): qty 1

## 2016-10-28 NOTE — Progress Notes (Signed)
[place] [facility] [place] NAME: Wanda Reed    MR#:  [id number]  DATE OF BIRTH:  18-Apr-1955  SUBJECTIVE:  CHIEF COMPLAINT:   Chief Complaint  Patient presents with  . Generalized Body Aches   Complains of myalgias. Afebrile today.  REVIEW OF SYSTEMS:    Review of Systems  Constitutional: Positive for malaise/fatigue. Negative for chills and fever.  HENT: Negative for sore throat.   Eyes: Negative for blurred vision, double vision and pain.  Respiratory: Negative for cough, hemoptysis, shortness of breath and wheezing.   Cardiovascular: Negative for chest pain, palpitations, orthopnea and leg swelling.  Gastrointestinal: Positive for nausea. Negative for abdominal pain, constipation, diarrhea, heartburn and vomiting.  Genitourinary: Negative for dysuria and hematuria.  Musculoskeletal: Negative for back pain and joint pain.  Skin: Negative for rash.  Neurological: Positive for weakness. Negative for sensory change, speech change, focal weakness and headaches.  Endo/Heme/Allergies: Does not bruise/bleed easily.  Psychiatric/Behavioral: Negative for depression. The patient is not nervous/anxious.    DRUG ALLERGIES:  No Known Allergies  VITALS:  Blood pressure (!) 143/61, pulse 98, temperature 99.1 F (37.3 C), temperature source Oral, resp. rate 20, height 5' (1.524 m), weight 81.1 kg (178 lb 12.8 oz), SpO2 98 %.  PHYSICAL EXAMINATION:   Physical Exam  GENERAL:  61 y.o.-year-old patient lying in the bed with no acute distress.  EYES: Pupils equal, round, reactive to light and accommodation. No scleral icterus. Extraocular muscles intact.  HEENT: Head atraumatic, normocephalic. Oropharynx and nasopharynx clear.  NECK:  Supple, no jugular venous distention. No thyroid enlargement, no tenderness.  LUNGS: Normal breath sounds bilaterally, no wheezing, rales, rhonchi. No use of accessory muscles of respiration.  CARDIOVASCULAR: S1, S2  normal. No murmurs, rubs, or gallops.  ABDOMEN: Soft, nontender, nondistended. Bowel sounds present. No organomegaly or mass.  EXTREMITIES: No cyanosis, clubbing or edema b/l.    NEUROLOGIC: Cranial nerves II through XII are intact. No focal Motor or sensory deficits b/l.   PSYCHIATRIC: The patient is alert and oriented x 3.  SKIN: No obvious rash, lesion, or ulcer.   LABORATORY PANEL:   CBC  Recent Labs Lab 10/28/16 0603  WBC 0.7*  HGB 11.5*  HCT 33.6*  PLT 69*   ------------------------------------------------------------------------------------------------------------------ Chemistries   Recent Labs Lab 10/26/16 1850 10/28/16 0603  NA 129* 131*  K 3.4* 4.0  CL 94* 100*  CO2 26 27  GLUCOSE 133* 148*  BUN 10 7  CREATININE 0.60 0.50  CALCIUM 8.7* 8.1*  AST 31  --   ALT 18  --   ALKPHOS 85  --   BILITOT 1.1  --    ------------------------------------------------------------------------------------------------------------------  Cardiac Enzymes No results for input(s): TROPONINI in the last 168 hours. ------------------------------------------------------------------------------------------------------------------  RADIOLOGY:  Dg Chest 2 View  Result Date: 10/26/2016 CLINICAL DATA:  61 year old female with history of body aches, headache and sore throat for the past 3 days. Undergoing chemotherapy for breast cancer. EXAM: CHEST  2 VIEW COMPARISON:  Chest x-ray 08/28/2016. FINDINGS: Lung volumes are normal. No consolidative airspace disease. No pleural effusions. No pneumothorax. No pulmonary nodule or mass noted. Pulmonary vasculature and the cardiomediastinal silhouette are within normal limits. IMPRESSION: No radiographic evidence of acute cardiopulmonary disease. Electronically Signed   By: Vinnie Langton M.D.   On: 10/26/2016 20:28   Ct Soft Tissue Neck W Contrast  Result Date: 10/26/2016 CLINICAL DATA:  Neck pain.  Current treatment for breast cancer EXAM: CT  NECK WITH CONTRAST  TECHNIQUE: Multidetector CT imaging of the neck was performed using the standard protocol following the bolus administration of intravenous contrast. CONTRAST:  41mL ISOVUE-300 IOPAMIDOL (ISOVUE-300) INJECTION 61% COMPARISON:  None. FINDINGS: Pharynx and larynx: Normal. No mass or swelling. Salivary glands: No inflammation, mass, or stone. Thyroid: Small thyroid gland bilaterally without mass lesion. Lymph nodes: Negative Vascular: Negative Limited intracranial: Negative Visualized orbits: Negative Mastoids and visualized paranasal sinuses: Negative Skeleton: Negative for fracture or metastatic disease. Upper chest: Negative Other: None IMPRESSION: Negative CT neck Electronically Signed   By: Franchot Gallo M.D.   On: 10/26/2016 20:22     ASSESSMENT AND PLAN:   * Sepsis. Etiology unclear. Could be sore throat Strep negative. Continue IV antibiotics. Cultures pending.  * Breast cancer with metastases. Recent chemotherapy and Neulasta. Monitor CBC. [facility] consult oncology for further input.  * Hypertension  * DVT prophylaxis. We will hold Lovenox due to thrombocytopenia from chemotherapy.  All the records are reviewed and case discussed with Care Management/Social Worker Management plans discussed with the patient, family and they are in agreement.  CODE STATUS: FULL CODE  DVT Prophylaxis: SCDs  TOTAL TIME TAKING CARE OF THIS PATIENT: 30 minutes.   POSSIBLE D/C IN 1-2 DAYS, DEPENDING ON CLINICAL CONDITION.  Hillary Bow R M.D on 10/28/2016 at 2:28 PM  Between 7am to 6pm - Pager - [phone]  After 6pm go to [email or web address] - password [email or web address] [facility]  Office  [phone]  CC: Primary care physician; Denton Lank, MD  Note: This dictation was prepared with Dragon dictation along with smaller phrase technology. Any transcriptional errors that result from this process are unintentional.

## 2016-10-28 NOTE — Progress Notes (Signed)
Chaplain was told that this patient wanted to complete an Advance Directive, and needed an interpreter. Chaplain went to see patient and obtained an interpreter, and the witness and Chaplain completed the Advance Directive.

## 2016-10-29 ENCOUNTER — Ambulatory Visit: Payer: Medicaid Other | Admitting: Radiation Oncology

## 2016-10-29 ENCOUNTER — Inpatient Hospital Stay: Payer: Medicaid Other | Admitting: Oncology

## 2016-10-29 ENCOUNTER — Inpatient Hospital Stay: Payer: Medicaid Other

## 2016-10-29 ENCOUNTER — Ambulatory Visit: Payer: Medicaid Other | Admitting: Oncology

## 2016-10-29 ENCOUNTER — Ambulatory Visit: Payer: Medicaid Other

## 2016-10-29 ENCOUNTER — Other Ambulatory Visit: Payer: Medicaid Other

## 2016-10-29 LAB — BASIC METABOLIC PANEL
Anion gap: 7 (ref 5–15)
BUN: 6 mg/dL (ref 6–20)
CO2: 28 mmol/L (ref 22–32)
Calcium: 8.7 mg/dL — ABNORMAL LOW (ref 8.9–10.3)
Chloride: 89 mmol/L — ABNORMAL LOW (ref 101–111)
Creatinine, Ser: 0.63 mg/dL (ref 0.44–1.00)
GFR calc Af Amer: >60 mL/min (ref >60)
GFR calc non Af Amer: >60 mL/min (ref >60)
Glucose, Bld: 141 mg/dL — ABNORMAL HIGH (ref 65–99)
Potassium: 3.6 mmol/L (ref 3.5–5.1)
Sodium: 124 mmol/L — ABNORMAL LOW (ref 135–145)

## 2016-10-29 LAB — CBC WITH DIFFERENTIAL/PLATELET
Basophils Absolute: 0.1 10*3/uL (ref 0–0.1)
Basophils Relative: 2 %
Eosinophils Absolute: 0 10*3/uL (ref 0–0.7)
Eosinophils Relative: 0 %
HCT: 36 % (ref 35.0–47.0)
Hemoglobin: 12.7 g/dL (ref 12.0–16.0)
Lymphocytes Relative: 7 %
Lymphs Abs: 0.4 10*3/uL — ABNORMAL LOW (ref 1.0–3.6)
MCH: 32.1 pg (ref 26.0–34.0)
MCHC: 35.2 g/dL (ref 32.0–36.0)
MCV: 91.1 fL (ref 80.0–100.0)
Monocytes Absolute: 0.5 10*3/uL (ref 0.2–0.9)
Monocytes Relative: 9 %
Neutro Abs: 4.1 10*3/uL (ref 1.4–6.5)
Neutrophils Relative %: 82 %
Platelets: 98 10*3/uL — ABNORMAL LOW (ref 150–440)
RBC: 3.95 MIL/uL (ref 3.80–5.20)
RDW: 12 % (ref 11.5–14.5)
WBC: 5.1 10*3/uL (ref 3.6–11.0)

## 2016-10-29 NOTE — Consult Note (Signed)
[facility] Infectious Disease     Reason for Consult: Fevers, immunocompromised   Referring Physician: Boykin Reaper Date of Admission:  10/26/2016   Active Problems:   Sepsis ([place])   HPI: Wanda Reed is a 61 y.o. female with breast cancer undergoing first round of chemo admitted with fevers and myalgias as well as bone pain and chills. On admit her temp was 100.3, wbc 2.1 and decreased to 0.7 with ANC 0.2.    LFTs nml, UA neg CXR neg, strep test neg, CT done 8/12 abd showed some cirrhotic changes.  Since admission has been afebrile and wbc improved. She has neg bcx.  Has been on zosyn and vanco.   She is seen with an interpreter.  Past Medical History:  Diagnosis Date  . Anxiety   . Breast cancer of upper-outer quadrant of right female breast ([place]) 08/2016   1.0 cm ER+, PR-, Her 2 neu not overexpressed. Node negative. T1b, N0.  [name] Cancer (HCC)    cervical  . Hypertension   . Thyroid disease    Past Surgical History:  Procedure Laterality Date  . ABDOMINAL HYSTERECTOMY    . BREAST LUMPECTOMY Right 08/28/2016  . BREAST LUMPECTOMY WITH SENTINEL LYMPH NODE BIOPSY Right 08/28/2016   Procedure: BREAST LUMPECTOMY WITH SENTINEL LYMPH NODE BX;  Surgeon: [name], MD;  Location: [facility] ORS;  Service: General;  Laterality: Right;  . BREAST MAMMOSITE Right 09/15/2016   Procedure: MAMMOSITE BREAST;  Surgeon: [name], MD;  Location: [facility] ORS;  Service: General;  Laterality: Right;  . BREAST SURGERY Right    Breast Biopsy  . CERVIX SURGERY    . COLONOSCOPY  2008  . EYE SURGERY Bilateral    Cataract Extraction with IOL  . SHOULDER ARTHROSCOPY Right    Social History  Substance Use Topics  . Smoking status: Never Smoker  . Smokeless tobacco: Never Used  . Alcohol use No   Family History  Problem Relation Age of Onset  . Breast cancer Neg Hx     Allergies: No Known Allergies  Current antibiotics: Antibiotics Given (last 72 hours)    Date/Time Action Medication  Dose Rate   10/26/16 2033 New Bag/Given   piperacillin-tazobactam (ZOSYN) IVPB 3.375 g 3.375 g 100 mL/hr   10/26/16 2121 New Bag/Given   vancomycin (VANCOCIN) IVPB 1000 mg/200 mL premix 1,000 mg 200 mL/hr   10/27/16 0417 New Bag/Given  [Left hand]   vancomycin (VANCOCIN) IVPB 750 mg/150 ml premix 750 mg 150 mL/hr   10/27/16 0545 Given   acyclovir (ZOVIRAX) 200 MG capsule 400 mg 400 mg    10/27/16 0551 New Bag/Given   piperacillin-tazobactam (ZOSYN) IVPB 3.375 g 3.375 g 12.5 mL/hr   10/27/16 1407 New Bag/Given   piperacillin-tazobactam (ZOSYN) IVPB 3.375 g 3.375 g 12.5 mL/hr   10/27/16 1641 New Bag/Given   vancomycin (VANCOCIN) IVPB 750 mg/150 ml premix 750 mg 150 mL/hr   10/27/16 2134 New Bag/Given   piperacillin-tazobactam (ZOSYN) IVPB 3.375 g 3.375 g 12.5 mL/hr   10/28/16 0435 New Bag/Given  [pt request]   piperacillin-tazobactam (ZOSYN) IVPB 3.375 g 3.375 g 12.5 mL/hr   10/28/16 0435 New Bag/Given   vancomycin (VANCOCIN) IVPB 750 mg/150 ml premix 750 mg 150 mL/hr   10/28/16 1435 New Bag/Given   piperacillin-tazobactam (ZOSYN) IVPB 3.375 g 3.375 g 12.5 mL/hr   10/28/16 2200 New Bag/Given   piperacillin-tazobactam (ZOSYN) IVPB 3.375 g 3.375 g 12.5 mL/hr   10/29/16 0548 New Bag/Given   piperacillin-tazobactam (ZOSYN) IVPB  3.375 g 3.375 g 12.5 mL/hr      MEDICATIONS: . docusate sodium  100 mg Oral BID  . levothyroxine  100 mcg Oral QAC breakfast  . lisinopril  10 mg Oral Daily  . multivitamin with minerals  1 tablet Oral Daily    Review of Systems - 11 systems reviewed and negative per HPI   OBJECTIVE: Temp:  [99.2 F (37.3 C)-99.5 F (37.5 C)] 99.2 F (37.3 C) (08/15 1048) Pulse Rate:  [103-107] 103 (08/15 1046) Resp:  [16-22] 16 (08/15 1046) BP: (110-124)/(51-70) 110/53 (08/15 1048) SpO2:  [96 %-98 %] 98 % (08/15 1046) Weight:  [72.3 kg (159 lb 8 oz)] 72.3 kg (159 lb 8 oz) (08/15 0547) Physical Exam  Constitutional:  oriented to person, place, and time. appears  well-developed and well-nourished. No distress.  HENT: [place]/AT, PERRLA, no scleral icterus Mouth/Throat: Oropharynx is clear and moist. No oropharyngeal exudate.  Cardiovascular: Normal rate, regular rhythm and normal heart sounds. Exam reveals no gallop and no friction rub.  No murmur heard.  Pulmonary/Chest: Effort normal and breath sounds normal. No respiratory distress.  has no wheezes.  R breast with scar from surgery, has mild erythema surrounding it which is mildly tender with deep palpation.  Neck = supple, no nuchal rigidity Abdominal: Soft. Bowel sounds are normal.  exhibits no distension. There is no tenderness.  Lymphadenopathy: no cervical adenopathy. No axillary adenopathy Neurological: alert and oriented to person, place, and time.  Skin: Skin is warm and dry. No rash noted. No erythema.  Psychiatric: a normal mood and affect.  behavior is normal.    LABS: Results for orders placed or performed during the hospital encounter of 10/26/16 (from the past 48 hour(s))  Basic metabolic panel     Status: Abnormal   Collection Time: 10/28/16  6:03 AM  Result Value Ref Range   Sodium 131 (L) 135 - 145 mmol/L   Potassium 4.0 3.5 - 5.1 mmol/L   Chloride 100 (L) 101 - 111 mmol/L   CO2 27 22 - 32 mmol/L   Glucose, Bld 148 (H) 65 - 99 mg/dL   BUN 7 6 - 20 mg/dL   Creatinine, Ser 0.50 0.44 - 1.00 mg/dL   Calcium 8.1 (L) 8.9 - 10.3 mg/dL   GFR calc non Af Amer >60 >60 mL/min   GFR calc Af Amer >60 >60 mL/min    Comment: (NOTE) The eGFR has been calculated using the CKD EPI equation. This calculation has not been validated in all clinical situations. eGFR's persistently <60 mL/min signify possible Chronic Kidney Disease.    Anion gap 4 (L) 5 - 15  CBC with Differential/Platelet     Status: Abnormal   Collection Time: 10/28/16  6:03 AM  Result Value Ref Range   WBC 0.7 (LL) 3.6 - 11.0 K/uL    Comment: RESULT REPEATED AND VERIFIED CRITICAL RESULT CALLED TO, READ BACK BY AND  VERIFIED WITH: DONNA HASLIP AT 0725 ON 10/28/16 [place]. TOO FEW TO COUNT, SMEAR AVAILABLE FOR REVIEW    RBC 3.65 (L) 3.80 - 5.20 MIL/uL   Hemoglobin 11.5 (L) 12.0 - 16.0 g/dL   HCT 33.6 (L) 35.0 - 47.0 %   MCV 92.2 80.0 - 100.0 fL   MCH 31.6 26.0 - 34.0 pg   MCHC 34.3 32.0 - 36.0 g/dL   RDW 12.3 11.5 - 14.5 %   Platelets 69 (L) 150 - 440 K/uL   Neutrophils Relative % 32 %   Neutro Abs 0.2 (L) 1.4 -  6.5 K/uL   Lymphocytes Relative 39 %   Lymphs Abs 0.3 (L) 1.0 - 3.6 K/uL   Monocytes Relative 28 %   Monocytes Absolute 0.2 0.2 - 0.9 K/uL   Eosinophils Relative 1 %   Eosinophils Absolute 0.0 0 - 0.7 K/uL   Basophils Relative 0 %   Basophils Absolute 0.0 0 - 0.1 K/uL  CBC with Differential/Platelet     Status: Abnormal   Collection Time: 10/29/16  3:01 AM  Result Value Ref Range   WBC 5.1 3.6 - 11.0 K/uL   RBC 3.95 3.80 - 5.20 MIL/uL   Hemoglobin 12.7 12.0 - 16.0 g/dL   HCT 36.0 35.0 - 47.0 %   MCV 91.1 80.0 - 100.0 fL   MCH 32.1 26.0 - 34.0 pg   MCHC 35.2 32.0 - 36.0 g/dL   RDW 12.0 11.5 - 14.5 %   Platelets 98 (L) 150 - 440 K/uL   Neutrophils Relative % 82 %   Lymphocytes Relative 7 %   Monocytes Relative 9 %   Eosinophils Relative 0 %   Basophils Relative 2 %   Neutro Abs 4.1 1.4 - 6.5 K/uL   Lymphs Abs 0.4 (L) 1.0 - 3.6 K/uL   Monocytes Absolute 0.5 0.2 - 0.9 K/uL   Eosinophils Absolute 0.0 0 - 0.7 K/uL   Basophils Absolute 0.1 0 - 0.1 K/uL   WBC Morphology      MODERATE LEFT SHIFT (>5% METAS AND MYELOS,OCC PRO NOTED)    Comment: DUE TO NEULASTA THERAPY   Smear Review MORPHOLOGY UNREMARKABLE   Basic metabolic panel     Status: Abnormal   Collection Time: 10/29/16  3:01 AM  Result Value Ref Range   Sodium 124 (L) 135 - 145 mmol/L   Potassium 3.6 3.5 - 5.1 mmol/L   Chloride 89 (L) 101 - 111 mmol/L   CO2 28 22 - 32 mmol/L   Glucose, Bld 141 (H) 65 - 99 mg/dL   BUN 6 6 - 20 mg/dL   Creatinine, Ser 0.63 0.44 - 1.00 mg/dL   Calcium 8.7 (L) 8.9 - 10.3 mg/dL   GFR calc  non Af Amer >60 >60 mL/min   GFR calc Af Amer >60 >60 mL/min    Comment: (NOTE) The eGFR has been calculated using the CKD EPI equation. This calculation has not been validated in all clinical situations. eGFR's persistently <60 mL/min signify possible Chronic Kidney Disease.    Anion gap 7 5 - 15   No components found for: ESR, C REACTIVE PROTEIN MICRO: Recent Results (from the past 720 hour(s))  Blood Culture (routine x 2)     Status: None (Preliminary result)   Collection Time: 10/26/16  6:51 PM  Result Value Ref Range Status   Specimen Description BLOOD BLOOD LEFT ARM  Final   Special Requests   Final    BOTTLES DRAWN AEROBIC AND ANAEROBIC Blood Culture adequate volume   Culture NO GROWTH 3 DAYS  Final   Report Status PENDING  Incomplete  Blood Culture (routine x 2)     Status: None (Preliminary result)   Collection Time: 10/26/16  8:26 PM  Result Value Ref Range Status   Specimen Description BLOOD LEFT UPPER ARM  Final   Special Requests   Final    BOTTLES DRAWN AEROBIC AND ANAEROBIC Blood Culture adequate volume   Culture NO GROWTH 3 DAYS  Final   Report Status PENDING  Incomplete    IMAGING: Dg Chest 2 View  Result Date:  10/26/2016 CLINICAL DATA:  61 year old female with history of body aches, headache and sore throat for the past 3 days. Undergoing chemotherapy for breast cancer. EXAM: CHEST  2 VIEW COMPARISON:  Chest x-ray 08/28/2016. FINDINGS: Lung volumes are normal. No consolidative airspace disease. No pleural effusions. No pneumothorax. No pulmonary nodule or mass noted. Pulmonary vasculature and the cardiomediastinal silhouette are within normal limits. IMPRESSION: No radiographic evidence of acute cardiopulmonary disease. Electronically Signed   By: Vinnie Langton M.D.   On: 10/26/2016 20:28   Ct Soft Tissue Neck W Contrast  Result Date: 10/26/2016 CLINICAL DATA:  Neck pain.  Current treatment for breast cancer EXAM: CT NECK WITH CONTRAST TECHNIQUE:  Multidetector CT imaging of the neck was performed using the standard protocol following the bolus administration of intravenous contrast. CONTRAST:  40m ISOVUE-300 IOPAMIDOL (ISOVUE-300) INJECTION 61% COMPARISON:  None. FINDINGS: Pharynx and larynx: Normal. No mass or swelling. Salivary glands: No inflammation, mass, or stone. Thyroid: Small thyroid gland bilaterally without mass lesion. Lymph nodes: Negative Vascular: Negative Limited intracranial: Negative Visualized orbits: Negative Mastoids and visualized paranasal sinuses: Negative Skeleton: Negative for fracture or metastatic disease. Upper chest: Negative Other: None IMPRESSION: Negative CT neck Electronically Signed   By: CFranchot GalloM.D.   On: 10/26/2016 20:22   Ct Abdomen Pelvis W Contrast  Result Date: 10/28/2016 CLINICAL DATA:  New diagnosis of right breast cancer. EXAM: CT ABDOMEN AND PELVIS WITH CONTRAST TECHNIQUE: Multidetector CT imaging of the abdomen and pelvis was performed using the standard protocol following bolus administration of intravenous contrast. CONTRAST:  1026mISOVUE-300 IOPAMIDOL (ISOVUE-300) INJECTION 61% COMPARISON:  The chest CT 08/28/2016 FINDINGS: Lower chest: Bibasilar dependent subpleural atelectasis. No pleural effusion or pulmonary lesions. The heart is normal in size. No pericardial effusion. The distal esophagus and aorta are unremarkable. Hepatobiliary: Small low-attenuation liver lesions are stable and consistent with benign cysts. No worrisome hepatic lesions to suggest metastatic disease. The gallbladder is normal. No intra or extrahepatic biliary dilatation. The the liver contour is slightly irregular, the hepatic fissures are prominent and there is increased caudate to right lobe ratio. These findings suggest cirrhosis. The portal and hepatic veins are patent. For early portal venous collaterals are suspected. No esophageal varices. No splenomegaly. Pancreas: No mass, inflammation or ductal dilatation.  Spleen: Normal size.  No focal lesions. Adrenals/Urinary Tract: The adrenal glands and kidneys are unremarkable. Small scattered cysts are noted. No obstructing ureteral calculi or bladder calculi. There are 2 small surgical clips noted near the right distal ureter. Stomach/Bowel: The stomach, duodenum, small bowel and colon are grossly normal without oral contrast. No inflammatory changes, mass lesions or obstructive findings. The terminal ileum and appendix are normal. Vascular/Lymphatic: The aorta is normal in caliber. No dissection. The branch vessels are patent. The major venous structures are patent. No mesenteric or retroperitoneal mass or adenopathy. Small scattered lymph nodes are noted. Reproductive: Surgically absent. Other: Scattered surgical clips in the pelvis but no mass or adenopathy. No inguinal mass or adenopathy. Musculoskeletal: No significant bony findings. No definite findings for osseous metastatic disease. IMPRESSION: 1. No findings to suggest metastatic disease involving the abdomen/pelvis. 2. 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 No splenomegaly or ascites. No esophageal varices. 3. No abdominal/pelvic lymphadenopathy. 4. No findings for osseous metastatic disease. Electronically Signed   By: P.Marijo Sanes.D.   On: 10/28/2016 16:00    Assessment:   CeIona Stays a [age].o. female undergoing chemo for breast  cancer admitted with low grade fevers, neutropenia, fatigue, chills  and myalgias.  Work up so far negative. ANC has recovered.  LFTs nml, UA neg CXR neg, strep test neg, CT done 8/12 abd showed some cirrhotic changes.  On exam she does have mild erythema around her R breast scar site which she is not sure when it started. May have mild cellulitis at the site.  She can likely be transitioned to an oral regimen for neutropenic fever for a total of 7 days of abx.  Recommendations Change to levofloxacin 750  mg for 7 more days - this will cover typical organisms for neutropenic fever as well as the breast cellulitis Monitor site of redness around breast and if spreads or recurs  may need to extend or change abx I can see in 7-10 days time in clinic Thank you very much for allowing me to participate in the care of this patient. Please call with questions.   Cheral Marker. Ola Spurr, MD

## 2016-10-29 NOTE — Progress Notes (Signed)
[facility] at [place] NAME: Wanda Reed    MR#:  [id number]  DATE OF BIRTH:  1956-01-29  SUBJECTIVE:  CHIEF COMPLAINT:   Chief Complaint  Patient presents with  . Generalized Body Aches   Complains of myalgias Which are improved today Afebrile today. Temperatures between 99-100  REVIEW OF SYSTEMS:    Review of Systems  Constitutional: Positive for malaise/fatigue. Negative for chills and fever.  HENT: Negative for sore throat.   Eyes: Negative for blurred vision, double vision and pain.  Respiratory: Negative for cough, hemoptysis, shortness of breath and wheezing.   Cardiovascular: Negative for chest pain, palpitations, orthopnea and leg swelling.  Gastrointestinal: Positive for nausea. Negative for abdominal pain, constipation, diarrhea, heartburn and vomiting.  Genitourinary: Negative for dysuria and hematuria.  Musculoskeletal: Negative for back pain and joint pain.  Skin: Negative for rash.  Neurological: Positive for weakness. Negative for sensory change, speech change, focal weakness and headaches.  Endo/Heme/Allergies: Does not bruise/bleed easily.  Psychiatric/Behavioral: Negative for depression. The patient is not nervous/anxious.    DRUG ALLERGIES:  No Known Allergies  VITALS:  Blood pressure (!) 110/53, pulse (!) 103, temperature 99.2 F (37.3 C), temperature source Oral, resp. rate 16, height 5' (1.524 m), weight 72.3 kg (159 lb 8 oz), SpO2 98 %.  PHYSICAL EXAMINATION:   Physical Exam  GENERAL:  61 y.o.-year-old patient lying in the bed with no acute distress.  EYES: Pupils equal, round, reactive to light and accommodation. No scleral icterus. Extraocular muscles intact.  HEENT: Head atraumatic, normocephalic. Oropharynx and nasopharynx clear.  NECK:  Supple, no jugular venous distention. No thyroid enlargement, no tenderness.  LUNGS: Normal breath sounds bilaterally, no wheezing, rales, rhonchi. No use of accessory  muscles of respiration.  CARDIOVASCULAR: S1, S2 normal. No murmurs, rubs, or gallops.  ABDOMEN: Soft, nontender, nondistended. Bowel sounds present. No organomegaly or mass.  EXTREMITIES: No cyanosis, clubbing or edema b/l.    NEUROLOGIC: Cranial nerves II through XII are intact. No focal Motor or sensory deficits b/l.   PSYCHIATRIC: The patient is alert and oriented x 3.  SKIN: No obvious rash, lesion, or ulcer.   LABORATORY PANEL:   CBC  Recent Labs Lab 10/29/16 0301  WBC 5.1  HGB 12.7  HCT 36.0  PLT 98*   ------------------------------------------------------------------------------------------------------------------ Chemistries   Recent Labs Lab 10/26/16 1850  10/29/16 0301  NA 129*  < > 124*  K 3.4*  < > 3.6  CL 94*  < > 89*  CO2 26  < > 28  GLUCOSE 133*  < > 141*  BUN 10  < > 6  CREATININE 0.60  < > 0.63  CALCIUM 8.7*  < > 8.7*  AST 31  --   --   ALT 18  --   --   ALKPHOS 85  --   --   BILITOT 1.1  --   --   < > = values in this interval not displayed. ------------------------------------------------------------------------------------------------------------------  Cardiac Enzymes No results for input(s): TROPONINI in the last 168 hours. ------------------------------------------------------------------------------------------------------------------  RADIOLOGY:  Ct Abdomen Pelvis W Contrast  Result Date: 10/28/2016 CLINICAL DATA:  New diagnosis of right breast cancer. EXAM: CT ABDOMEN AND PELVIS WITH CONTRAST TECHNIQUE: Multidetector CT imaging of the abdomen and pelvis was performed using the standard protocol following bolus administration of intravenous contrast. CONTRAST:  198mL ISOVUE-300 IOPAMIDOL (ISOVUE-300) INJECTION 61% COMPARISON:  The chest CT 08/28/2016 FINDINGS: Lower chest: Bibasilar dependent subpleural atelectasis. No pleural effusion  or pulmonary lesions. The heart is normal in size. No pericardial effusion. The distal esophagus and aorta  are unremarkable. Hepatobiliary: Small low-attenuation liver lesions are stable and consistent with benign cysts. No worrisome hepatic lesions to suggest metastatic disease. The gallbladder is normal. No intra or extrahepatic biliary dilatation. The the liver contour is slightly irregular, the hepatic fissures are prominent and there is increased caudate to right lobe ratio. These findings suggest cirrhosis. The portal and hepatic veins are patent. For early portal venous collaterals are suspected. No esophageal varices. No splenomegaly. Pancreas: No mass, inflammation or ductal dilatation. Spleen: Normal size.  No focal lesions. Adrenals/Urinary Tract: The adrenal glands and kidneys are unremarkable. Small scattered cysts are noted. No obstructing ureteral calculi or bladder calculi. There are 2 small surgical clips noted near the right distal ureter. Stomach/Bowel: The stomach, duodenum, small bowel and colon are grossly normal without oral contrast. No inflammatory changes, mass lesions or obstructive findings. The terminal ileum and appendix are normal. Vascular/Lymphatic: The aorta is normal in caliber. No dissection. The branch vessels are patent. The major venous structures are patent. No mesenteric or retroperitoneal mass or adenopathy. Small scattered lymph nodes are noted. Reproductive: Surgically absent. Other: Scattered surgical clips in the pelvis but no mass or adenopathy. No inguinal mass or adenopathy. Musculoskeletal: No significant bony findings. No definite findings for osseous metastatic disease. IMPRESSION: 1. No findings to suggest metastatic disease involving the abdomen/pelvis. 2. 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 No splenomegaly or ascites. No esophageal varices. 3. No abdominal/pelvic lymphadenopathy. 4. No findings for osseous metastatic disease. Electronically Signed   By: Marijo Sanes M.D.   On: 10/28/2016 16:00      ASSESSMENT AND PLAN:   * Sepsis. Etiology unclear. Could be sore throat Strep negative. Continue IV antibiotics. Cultures pending. Urinalysis and chest x-ray negative. CT scan of the abdomen and pelvis showed nothing acute. Discussed with Dr. Ola Spurr of infectious disease will see the patient. Erythema on right breast looks like a bruise and not cellulitis.  * Hyponatremia. Likely due to low solute load. 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 Encouraged to increase nutrition. Repeat labs in the morning.  * Breast cancer with metastases. Recent chemotherapy and Neulasta. Monitor CBC. [facility] consult oncology for further input.  * Hypertension Medications held  * DVT prophylaxis. We will hold Lovenox due to thrombocytopenia from chemotherapy.  All the records are reviewed and case discussed with Care Management/Social Worker Management plans discussed with the patient, family and they are in agreement.  CODE STATUS: FULL CODE  DVT Prophylaxis: SCDs  TOTAL TIME TAKING CARE OF THIS PATIENT: 30 minutes.   POSSIBLE D/C IN 1-2 DAYS, DEPENDING ON CLINICAL CONDITION.  Hillary Bow R M.D on 10/29/2016 at 1:50 PM  Between 7am to 6pm - Pager - [phone]  After 6pm go to [email or web address] - password [email or web address] [facility]  Office  [phone]  CC: Primary care physician; Denton Lank, MD  Note: This dictation was prepared with Dragon dictation along with smaller phrase technology. Any transcriptional errors that result from this process are unintentional.

## 2016-10-29 NOTE — Progress Notes (Signed)
[facility] at [place] NAME: Wanda Reed    MR#:  [id number]  DATE OF BIRTH:  Feb 04, 1956  SUBJECTIVE:  CHIEF COMPLAINT:   Chief Complaint  Patient presents with  . Generalized Body Aches   Patient seen and examined with [place] interpreter.  Complains of of pain all over. Nausea and vomiting earlier today. Feels weak  REVIEW OF SYSTEMS:    Review of Systems  Constitutional: Positive for malaise/fatigue. Negative for chills and fever.  HENT: Negative for sore throat.   Eyes: Negative for blurred vision, double vision and pain.  Respiratory: Negative for cough, hemoptysis, shortness of breath and wheezing.   Cardiovascular: Negative for chest pain, palpitations, orthopnea and leg swelling.  Gastrointestinal: Positive for nausea. Negative for abdominal pain, constipation, diarrhea, heartburn and vomiting.  Genitourinary: Negative for dysuria and hematuria.  Musculoskeletal: Negative for back pain and joint pain.  Skin: Negative for rash.  Neurological: Positive for weakness. Negative for sensory change, speech change, focal weakness and headaches.  Endo/Heme/Allergies: Does not bruise/bleed easily.  Psychiatric/Behavioral: Negative for depression. The patient is not nervous/anxious.    DRUG ALLERGIES:  No Known Allergies  VITALS:  Blood pressure (!) 117/51, pulse (!) 106, temperature 99.5 F (37.5 C), temperature source Oral, resp. rate 20, height 5' (1.524 m), weight 72.3 kg (159 lb 8 oz), SpO2 96 %.  PHYSICAL EXAMINATION:   Physical Exam  GENERAL:  61 y.o.-year-old patient lying in the bed with no acute distress.  EYES: Pupils equal, round, reactive to light and accommodation. No scleral icterus. Extraocular muscles intact.  HEENT: Head atraumatic, normocephalic. Oropharynx and nasopharynx clear.  NECK:  Supple, no jugular venous distention. No thyroid enlargement, no tenderness.  LUNGS: Normal breath sounds bilaterally, no  wheezing, rales, rhonchi. No use of accessory muscles of respiration.  CARDIOVASCULAR: S1, S2 normal. No murmurs, rubs, or gallops.  ABDOMEN: Soft, nondistended. Bowel sounds present. No organomegaly or mass. LLQ tenderness  EXTREMITIES: No cyanosis, clubbing or edema b/l.    NEUROLOGIC: Cranial nerves II through XII are intact. No focal Motor or sensory deficits b/l.   PSYCHIATRIC: The patient is alert and oriented x 3.  SKIN: Erythema around right breat surgical scar  LABORATORY PANEL:   CBC  Recent Labs Lab 10/29/16 0301  WBC 5.1  HGB 12.7  HCT 36.0  PLT 98*   ------------------------------------------------------------------------------------------------------------------ Chemistries   Recent Labs Lab 10/26/16 1850  10/29/16 0301  NA 129*  < > 124*  K 3.4*  < > 3.6  CL 94*  < > 89*  CO2 26  < > 28  GLUCOSE 133*  < > 141*  BUN 10  < > 6  CREATININE 0.60  < > 0.63  CALCIUM 8.7*  < > 8.7*  AST 31  --   --   ALT 18  --   --   ALKPHOS 85  --   --   BILITOT 1.1  --   --   < > = values in this interval not displayed. ------------------------------------------------------------------------------------------------------------------  Cardiac Enzymes No results for input(s): TROPONINI in the last 168 hours. ------------------------------------------------------------------------------------------------------------------  RADIOLOGY:  Ct Abdomen Pelvis W Contrast  Result Date: 10/28/2016 CLINICAL DATA:  New diagnosis of right breast cancer. EXAM: CT ABDOMEN AND PELVIS WITH CONTRAST TECHNIQUE: Multidetector CT imaging of the abdomen and pelvis was performed using the standard protocol following bolus administration of intravenous contrast. CONTRAST:  174mL ISOVUE-300 IOPAMIDOL (ISOVUE-300) INJECTION 61% COMPARISON:  The chest CT 08/28/2016 FINDINGS:  Lower chest: Bibasilar dependent subpleural atelectasis. No pleural effusion or pulmonary lesions. The heart is normal in size. No  pericardial effusion. The distal esophagus and aorta are unremarkable. Hepatobiliary: Small low-attenuation liver lesions are stable and consistent with benign cysts. No worrisome hepatic lesions to suggest metastatic disease. The gallbladder is normal. No intra or extrahepatic biliary dilatation. The the liver contour is slightly irregular, the hepatic fissures are prominent and there is increased caudate to right lobe ratio. These findings suggest cirrhosis. The portal and hepatic veins are patent. For early portal venous collaterals are suspected. No esophageal varices. No splenomegaly. Pancreas: No mass, inflammation or ductal dilatation. Spleen: Normal size.  No focal lesions. Adrenals/Urinary Tract: The adrenal glands and kidneys are unremarkable. Small scattered cysts are noted. No obstructing ureteral calculi or bladder calculi. There are 2 small surgical clips noted near the right distal ureter. Stomach/Bowel: The stomach, duodenum, small bowel and colon are grossly normal without oral contrast. No inflammatory changes, mass lesions or obstructive findings. The terminal ileum and appendix are normal. Vascular/Lymphatic: The aorta is normal in caliber. No dissection. The branch vessels are patent. The major venous structures are patent. No mesenteric or retroperitoneal mass or adenopathy. Small scattered lymph nodes are noted. Reproductive: Surgically absent. Other: Scattered surgical clips in the pelvis but no mass or adenopathy. No inguinal mass or adenopathy. Musculoskeletal: No significant bony findings. No definite findings for osseous metastatic disease. IMPRESSION: 1. No findings to suggest metastatic disease involving the abdomen/pelvis. 2. 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 No splenomegaly or ascites. No esophageal varices. 3. No abdominal/pelvic lymphadenopathy. 4. No findings for osseous metastatic disease. Electronically  Signed   By: Marijo Sanes M.D.   On: 10/28/2016 16:00     ASSESSMENT AND PLAN:   * Sepsis. Etiology unclear. Could be sore throat Strep negative. Continue IV antibiotics. Cultures pending. Stop vancomycin Still acute and needs inpatient treatment  * Breast cancer with metastases. Recent chemotherapy and Neulasta. Monitor CBC.  Discussed with Dr. Grayland Ormond  * Hypertension  * pancytopenia due to chemotherapy  * DVT prophylaxis. We will hold Lovenox due to thrombocytopenia from chemotherapy.  All the records are reviewed and case discussed with Care Management/Social Worker Management plans discussed with the patient, family and they are in agreement.  CODE STATUS: FULL CODE  DVT Prophylaxis: SCDs  TOTAL TIME TAKING CARE OF THIS PATIENT: 30 minutes.   POSSIBLE D/C IN 1-2 DAYS, DEPENDING ON CLINICAL CONDITION.  Hillary Bow R M.D on 10/29/2016 at 9:26 AM  Between 7am to 6pm - Pager - [phone]  After 6pm go to [email or web address] - password [email or web address] [facility]  Office  [phone]  CC: Primary care physician; Denton Lank, MD  Note: This dictation was prepared with Dragon dictation along with smaller phrase technology. Any transcriptional errors that result from this process are unintentional.

## 2016-10-30 LAB — BASIC METABOLIC PANEL
Anion gap: 7 (ref 5–15)
BUN: 8 mg/dL (ref 6–20)
CO2: 30 mmol/L (ref 22–32)
Calcium: 8.6 mg/dL — ABNORMAL LOW (ref 8.9–10.3)
Chloride: 95 mmol/L — ABNORMAL LOW (ref 101–111)
Creatinine, Ser: 0.69 mg/dL (ref 0.44–1.00)
GFR calc Af Amer: >60 mL/min (ref >60)
GFR calc non Af Amer: >60 mL/min (ref >60)
Glucose, Bld: 111 mg/dL — ABNORMAL HIGH (ref 65–99)
Potassium: 3.3 mmol/L — ABNORMAL LOW (ref 3.5–5.1)
Sodium: 132 mmol/L — ABNORMAL LOW (ref 135–145)

## 2016-10-30 LAB — CBC WITH DIFFERENTIAL/PLATELET
Basophils Absolute: 0 10*3/uL (ref 0–0.1)
Basophils Relative: 0 %
Eosinophils Absolute: 0 10*3/uL (ref 0–0.7)
Eosinophils Relative: 0 %
HCT: 35.6 % (ref 35.0–47.0)
Hemoglobin: 12.4 g/dL (ref 12.0–16.0)
Lymphocytes Relative: 3 %
Lymphs Abs: 0.6 10*3/uL — ABNORMAL LOW (ref 1.0–3.6)
MCH: 31.6 pg (ref 26.0–34.0)
MCHC: 34.9 g/dL (ref 32.0–36.0)
MCV: 90.6 fL (ref 80.0–100.0)
Monocytes Absolute: 0.8 10*3/uL (ref 0.2–0.9)
Monocytes Relative: 4 %
Neutro Abs: 18.3 10*3/uL — ABNORMAL HIGH (ref 1.4–6.5)
Neutrophils Relative %: 93 %
Platelets: 124 10*3/uL — ABNORMAL LOW (ref 150–440)
RBC: 3.93 MIL/uL (ref 3.80–5.20)
RDW: 12 % (ref 11.5–14.5)
WBC: 19.7 10*3/uL — ABNORMAL HIGH (ref 3.6–11.0)

## 2016-10-30 MED ORDER — POTASSIUM CHLORIDE CRYS ER 20 MEQ PO TBCR
40.0000 meq | EXTENDED_RELEASE_TABLET | ORAL | Status: AC
  Administered 2016-10-30 (×2): 40 meq via ORAL
  Filled 2016-10-30: qty 2

## 2016-10-30 MED ORDER — ONDANSETRON HCL 4 MG PO TABS: 4.0000 mg | ORAL_TABLET | Freq: Three times a day (TID) | ORAL | 0 refills | Status: DC | PRN

## 2016-10-30 MED ORDER — DIPHENHYDRAMINE HCL 25 MG PO CAPS: 25.0000 mg | ORAL_CAPSULE | Freq: Three times a day (TID) | ORAL | Status: DC | PRN

## 2016-10-30 MED ORDER — OXYCODONE-ACETAMINOPHEN 5-325 MG PO TABS: 1.0000 | ORAL_TABLET | Freq: Four times a day (QID) | ORAL | 0 refills | Status: DC | PRN

## 2016-10-30 MED ORDER — LEVOFLOXACIN 750 MG PO TABS: 750.0000 mg | ORAL_TABLET | Freq: Every day | ORAL | 0 refills | Status: DC

## 2016-10-30 MED ORDER — POTASSIUM CHLORIDE CRYS ER 20 MEQ PO TBCR
EXTENDED_RELEASE_TABLET | ORAL | Status: AC
  Filled 2016-10-30: qty 2

## 2016-10-30 MED ORDER — DIPHENHYDRAMINE HCL 25 MG PO CAPS: 25.0000 mg | ORAL_CAPSULE | Freq: Three times a day (TID) | ORAL | 0 refills | Status: DC | PRN

## 2016-10-30 MED ORDER — FLUCONAZOLE 50 MG PO TABS
150.0000 mg | ORAL_TABLET | Freq: Once | ORAL | Status: AC
  Administered 2016-10-30: 14:00:00 150 mg via ORAL
  Filled 2016-10-30: qty 3

## 2016-10-30 NOTE — Progress Notes (Signed)
Discharge paperwork reviewed with patient's daughter (per patient's request) who is patient's primary caregiver and understands more about her medications. New paper prescriptions given- percocet, zofran, levaquin- verbalized understanding of how to take. Follow up appointments reviewed. Patient is stable and ready for discharge. All questions answered. Patient's daughter to transport home.

## 2016-10-30 NOTE — Discharge Instructions (Signed)
Resume diet and activity as before ° ° °

## 2016-10-30 NOTE — Progress Notes (Signed)
Dr. Darvin Neighbours notified pt c/o irritation/itching while voiding.

## 2016-10-30 NOTE — Plan of Care (Signed)
Problem: Education: Goal: Knowledge of [facility] Education information/materials will improve Outcome: Progressing VSS, free of falls during shift.  Reported pain 8/10, improved to 4/10 w/ PRN PO Percocet 5-325mg  x1 during shift.  No other needs overnight.  Ambulated to bathroom during shift.  Bed in low position, daughter at bedside.  Call bell within reach, [place].

## 2016-10-31 ENCOUNTER — Ambulatory Visit: Payer: Medicaid Other | Admitting: Radiation Oncology

## 2016-10-31 ENCOUNTER — Telehealth: Payer: Self-pay | Admitting: *Deleted

## 2016-10-31 LAB — CULTURE, BLOOD (ROUTINE X 2)
Culture: NO GROWTH
Culture: NO GROWTH
Special Requests: ADEQUATE
Special Requests: ADEQUATE

## 2016-10-31 NOTE — Telephone Encounter (Signed)
Patient daughter called to report that there is strep in the home. I advised to keep patient away from the sick and to wash hands, wash hands, wash hands.

## 2016-11-03 NOTE — Discharge Summary (Signed)
[facility] at [place] NAME: Wanda Reed    MR#:  [id number]  DATE OF BIRTH:  12-07-1955  DATE OF ADMISSION:  10/26/2016 ADMITTING PHYSICIAN: Harrie Foreman, MD  DATE OF DISCHARGE: 10/30/2016  2:30 PM  PRIMARY CARE PHYSICIAN: Denton Lank, MD   ADMISSION DIAGNOSIS:  Myalgia [M79.1] Flu-like symptoms [R68.89] Fever and neutropenia ([place]) [D70.9, R50.81]  DISCHARGE DIAGNOSIS:  Active Problems:   Sepsis ([place])   SECONDARY DIAGNOSIS:   Past Medical History:  Diagnosis Date  . Anxiety   . Breast cancer of upper-outer quadrant of right female breast ([place]) 08/2016   1.0 cm ER+, PR-, Her 2 neu not overexpressed. Node negative. T1b, N0.  [name] Cancer (HCC)    cervical  . Hypertension   . Thyroid disease      ADMITTING HISTORY  Chief Complaint: Body aches HPI: The patient with past medical history of breast cancer status post chemotherapy 4 days ago presents to the emergency department complaining of fever and body aches. The patient states that she's felt bad for the last 3 days. She's been unable to eat but denies nausea or vomiting. She also denies shortness of breath. She admits that her stools are loose but she has not had bowel movements lately because she has not been eating. Laboratory evaluation in the emergency department revealed leukopenia. She was given broad-spectrum antibiotics after blood cultures but due to her immunocompromised state and possible sepsis the emergency department staff called the hospitalist service for admission.  HOSPITAL COURSE:    * Sepsis. Etiology unclear. Could be sore throat Strep negative. Started on broad-spectrum IV antibiotics. Urinalysis and chest x-ray negative. CT scan of the abdomen and pelvis showed nothing acute. Discussed with Dr. Ola Spurr of infectious disease, suggested treating with Levaquin for 1 week.  Erythema on right breast was thought to be likely a bruise. Could be  cellulitis and patient is on antibiotics. Follow-up with oncology as outpatient.  * Hyponatremia. Likely due to low solute load. 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 Encouraged to increase nutrition. Improved by day of discharge.  * Breast cancer with metastases. Recent chemotherapy and Neulasta. Monitor CBC.  Pancytopenia improved. Skin by oncology in the hospital.  * Hypertension Medications in the hospital. Discharged home on same medications except hydrochlorothiazide.  * DVT prophylaxis. SCDs used in the hospital. No Lovenox or heparin due to thrombocytopenia.  Stable for discharge home  CONSULTS OBTAINED:  Treatment Team:  Lloyd Huger, MD Leonel Ramsay, MD  DRUG ALLERGIES:  No Known Allergies  DISCHARGE MEDICATIONS:   Discharge Medication List as of 10/30/2016  1:35 PM    START taking these medications   Details  diphenhydrAMINE (BENADRYL) 25 mg capsule Take 1 capsule (25 mg total) by mouth every 8 (eight) hours as needed for itching., Starting Thu 10/30/2016, No Print    levofloxacin (LEVAQUIN) 750 MG tablet Take 1 tablet (750 mg total) by mouth daily., Starting Thu 10/30/2016, Print    oxyCODONE-acetaminophen (PERCOCET/ROXICET) 5-325 MG tablet Take 1 tablet by mouth every 6 (six) hours as needed for moderate pain., Starting Thu 10/30/2016, Print      CONTINUE these medications which have CHANGED   Details  ondansetron (ZOFRAN) 4 MG tablet Take 1 tablet (4 mg total) by mouth every 8 (eight) hours as needed for nausea or vomiting., Starting Thu 10/30/2016, Print      CONTINUE these medications which have NOT CHANGED   Details  acetaminophen (TYLENOL) 500 MG tablet Take 1,000  mg by mouth every 6 (six) hours as needed (for pain.)., Historical Med    acyclovir (ZOVIRAX) 400 MG tablet Take 400 mg by mouth 3 (three) times daily as needed (cold sore outbreaks). , Historical Med    levothyroxine (SYNTHROID, LEVOTHROID) 100 MCG tablet Take 100 mcg by mouth daily before  breakfast., Historical Med    lisinopril (PRINIVIL,ZESTRIL) 10 MG tablet Take 10 mg by mouth daily., Historical Med    prochlorperazine (COMPAZINE) 10 MG tablet Take 1 tablet (10 mg total) by mouth every 6 (six) hours as needed (Nausea or vomiting)., Starting Sun 10/05/2016, Normal      STOP taking these medications     hydrochlorothiazide (HYDRODIURIL) 25 MG tablet         Today   VITAL SIGNS:  Blood pressure (!) 125/54, pulse (!) 102, temperature 99.2 F (37.3 C), temperature source Oral, resp. rate 18, height 5' (1.524 m), weight 71.6 kg (157 lb 14.4 oz), SpO2 100 %.  I/O:  No intake or output data in the 24 hours ending 11/03/16 1051  PHYSICAL EXAMINATION:  Physical Exam  GENERAL:  61 y.o.-year-old patient lying in the bed with no acute distress.  LUNGS: Normal breath sounds bilaterally, no wheezing, rales,rhonchi or crepitation. No use of accessory muscles of respiration.  CARDIOVASCULAR: S1, S2 normal. No murmurs, rubs, or gallops.  ABDOMEN: Soft, non-tender, non-distended. Bowel sounds present. No organomegaly or mass.  NEUROLOGIC: Moves all 4 extremities. PSYCHIATRIC: The patient is alert and oriented x 3.  SKIN: No obvious rash, lesion, or ulcer.   DATA REVIEW:   CBC  Recent Labs Lab 10/30/16 0247  WBC 19.7*  HGB 12.4  HCT 35.6  PLT 124*    Chemistries   Recent Labs Lab 10/30/16 0247  NA 132*  K 3.3*  CL 95*  CO2 30  GLUCOSE 111*  BUN 8  CREATININE 0.69  CALCIUM 8.6*    Cardiac Enzymes No results for input(s): TROPONINI in the last 168 hours.  Microbiology Results  Results for orders placed or performed during the hospital encounter of 10/26/16  Blood Culture (routine x 2)     Status: None   Collection Time: 10/26/16  6:51 PM  Result Value Ref Range Status   Specimen Description BLOOD BLOOD LEFT ARM  Final   Special Requests   Final    BOTTLES DRAWN AEROBIC AND ANAEROBIC Blood Culture adequate volume   Culture NO GROWTH 5 DAYS  Final    Report Status 10/31/2016 FINAL  Final  Blood Culture (routine x 2)     Status: None   Collection Time: 10/26/16  8:26 PM  Result Value Ref Range Status   Specimen Description BLOOD LEFT UPPER ARM  Final   Special Requests   Final    BOTTLES DRAWN AEROBIC AND ANAEROBIC Blood Culture adequate volume   Culture NO GROWTH 5 DAYS  Final   Report Status 10/31/2016 FINAL  Final    RADIOLOGY:  No results found.  Follow up with PCP in 1 week.  Management plans discussed with the patient, family and they are in agreement.  CODE STATUS:  Code Status History    Date Active Date Inactive Code Status Order ID Comments User Context   10/27/2016 12:48 AM 10/30/2016  5:42 PM Full Code [id number]  Harrie Foreman, MD Inpatient   08/28/2016  3:30 PM 08/29/2016  8:20 PM Full Code [id number]  [name], MD Inpatient      TOTAL TIME TAKING CARE OF THIS PATIENT  ON DAY OF DISCHARGE: more than 30 minutes.   Hillary Bow R M.D on 11/03/2016 at 10:51 AM  Between 7am to 6pm - Pager - [phone]  After 6pm go to [email or web address] - password [email or web address] [facility]  Office  [phone]  CC: Primary care physician; Denton Lank, MD  Note: This dictation was prepared with Dragon dictation along with smaller phrase technology. Any transcriptional errors that result from this process are unintentional.

## 2016-11-04 ENCOUNTER — Emergency Department: Payer: Medicaid Other

## 2016-11-04 ENCOUNTER — Emergency Department
Admission: EM | Admit: 2016-11-04 | Discharge: 2016-11-04 | Disposition: A | Discharge status: Home / Self Care | Payer: Medicaid Other | Attending: Emergency Medicine | Admitting: Emergency Medicine

## 2016-11-04 ENCOUNTER — Encounter: Payer: Self-pay | Admitting: Emergency Medicine

## 2016-11-04 DIAGNOSIS — Z79899 Other long term (current) drug therapy: Secondary | ICD-10-CM | POA: Diagnosis not present

## 2016-11-04 DIAGNOSIS — I10 Essential (primary) hypertension: Secondary | ICD-10-CM | POA: Diagnosis not present

## 2016-11-04 DIAGNOSIS — J029 Acute pharyngitis, unspecified: Secondary | ICD-10-CM | POA: Diagnosis present

## 2016-11-04 DIAGNOSIS — E079 Disorder of thyroid, unspecified: Secondary | ICD-10-CM | POA: Diagnosis not present

## 2016-11-04 LAB — COMPREHENSIVE METABOLIC PANEL
ALT: 19 U/L (ref 14–54)
AST: 33 U/L (ref 15–41)
Albumin: 3.6 g/dL (ref 3.5–5.0)
Alkaline Phosphatase: 92 U/L (ref 38–126)
Anion gap: 7 (ref 5–15)
BUN: 9 mg/dL (ref 6–20)
CO2: 26 mmol/L (ref 22–32)
Calcium: 9 mg/dL (ref 8.9–10.3)
Chloride: 98 mmol/L — ABNORMAL LOW (ref 101–111)
Creatinine, Ser: 0.57 mg/dL (ref 0.44–1.00)
GFR calc Af Amer: >60 mL/min (ref >60)
GFR calc non Af Amer: >60 mL/min (ref >60)
Glucose, Bld: 120 mg/dL — ABNORMAL HIGH (ref 65–99)
Potassium: 4.3 mmol/L (ref 3.5–5.1)
Sodium: 131 mmol/L — ABNORMAL LOW (ref 135–145)
Total Bilirubin: 0.7 mg/dL (ref 0.3–1.2)
Total Protein: 7.8 g/dL (ref 6.5–8.1)

## 2016-11-04 LAB — CBC WITH DIFFERENTIAL/PLATELET
Basophils Absolute: 0 10*3/uL (ref 0–0.1)
Basophils Relative: 0 %
Eosinophils Absolute: 0 10*3/uL (ref 0–0.7)
Eosinophils Relative: 0 %
HCT: 36.6 % (ref 35.0–47.0)
Hemoglobin: 12.6 g/dL (ref 12.0–16.0)
Lymphocytes Relative: 8 %
Lymphs Abs: 0.9 10*3/uL — ABNORMAL LOW (ref 1.0–3.6)
MCH: 31.7 pg (ref 26.0–34.0)
MCHC: 34.4 g/dL (ref 32.0–36.0)
MCV: 92.2 fL (ref 80.0–100.0)
Monocytes Absolute: 1.1 10*3/uL — ABNORMAL HIGH (ref 0.2–0.9)
Monocytes Relative: 9 %
Neutro Abs: 9.7 10*3/uL — ABNORMAL HIGH (ref 1.4–6.5)
Neutrophils Relative %: 83 %
Platelets: 99 10*3/uL — ABNORMAL LOW (ref 150–440)
RBC: 3.97 MIL/uL (ref 3.80–5.20)
RDW: 12.4 % (ref 11.5–14.5)
WBC: 11.7 10*3/uL — ABNORMAL HIGH (ref 3.6–11.0)

## 2016-11-04 LAB — URINALYSIS, COMPLETE (UACMP) WITH MICROSCOPIC
Bacteria, UA: NONE SEEN
Bilirubin Urine: NEGATIVE
Glucose, UA: NEGATIVE mg/dL
Hgb urine dipstick: NEGATIVE
Ketones, ur: NEGATIVE mg/dL
Leukocytes, UA: NEGATIVE
Nitrite: NEGATIVE
Protein, ur: NEGATIVE mg/dL
Specific Gravity, Urine: 1.009 (ref 1.005–1.030)
pH: 7 (ref 5.0–8.0)

## 2016-11-04 LAB — LACTIC ACID, PLASMA: Lactic Acid, Venous: 1.1 mmol/L (ref 0.5–1.9)

## 2016-11-04 MED ORDER — PENICILLIN V POTASSIUM 250 MG PO TABS
500.0000 mg | ORAL_TABLET | Freq: Once | ORAL | Status: AC
  Administered 2016-11-04: 500 mg via ORAL
  Filled 2016-11-04: qty 2

## 2016-11-04 MED ORDER — ACETAMINOPHEN 500 MG PO TABS
ORAL_TABLET | ORAL | Status: AC
  Filled 2016-11-04: qty 2

## 2016-11-04 MED ORDER — PENICILLIN V POTASSIUM 500 MG PO TABS: 500.0000 mg | ORAL_TABLET | Freq: Four times a day (QID) | ORAL | 0 refills | Status: DC

## 2016-11-04 MED ORDER — ACETAMINOPHEN 500 MG PO TABS
1000.0000 mg | ORAL_TABLET | Freq: Once | ORAL | Status: AC
  Administered 2016-11-04: 1000 mg via ORAL

## 2016-11-04 NOTE — ED Provider Notes (Signed)
[facility] Emergency Department Provider Note   ____________________________________________   First MD Initiated Contact with Patient 11/04/16 1849     (approximate)  I have reviewed the triage vital signs and the nursing notes.   HISTORY  Chief Complaint Fever and Sore Throat    HPI Wanda Reed is a 61 y.o. female who complains of fever and sore throat. There's people in the house who have strep throat. Family thinks she has strep throat as well. She had a fever over 101 at home today. She is getting chemotherapy for breast cancer. Last chemotherapy was on the eighth. Next chemotherapy was on the 28th. At the present time at 735 patient's heart rate is 92 blood pressures 108 patient's had several blood pressures like this in the past. Patient feels well except for the sore throat.   Past Medical History:  Diagnosis Date  . Anxiety   . Breast cancer of upper-outer quadrant of right female breast ([place]) 08/2016   1.0 cm ER+, PR-, Her 2 neu not overexpressed. Node negative. T1b, N0.  [name] Cancer (HCC)    cervical  . Hypertension   . Thyroid disease     Patient Active Problem List   Diagnosis Date Noted  . Sepsis ([place]) 10/26/2016  . Hyperkalemia 08/28/2016  . Wide-complex tachycardia ([place]) 08/28/2016  . Malignant neoplasm of upper-outer quadrant of right breast in female, estrogen receptor positive ([place]) 08/18/2016    Past Surgical History:  Procedure Laterality Date  . ABDOMINAL HYSTERECTOMY    . BREAST LUMPECTOMY Right 08/28/2016  . BREAST LUMPECTOMY WITH SENTINEL LYMPH NODE BIOPSY Right 08/28/2016   Procedure: BREAST LUMPECTOMY WITH SENTINEL LYMPH NODE BX;  Surgeon: [name], MD;  Location: [facility] ORS;  Service: General;  Laterality: Right;  . BREAST MAMMOSITE Right 09/15/2016   Procedure: MAMMOSITE BREAST;  Surgeon: [name], MD;  Location: [facility] ORS;  Service: General;  Laterality: Right;  . BREAST SURGERY Right    Breast  Biopsy  . CERVIX SURGERY    . COLONOSCOPY  2008  . EYE SURGERY Bilateral    Cataract Extraction with IOL  . SHOULDER ARTHROSCOPY Right     Prior to Admission medications   Medication Sig Start Date End Date Taking? Authorizing Provider  acetaminophen (TYLENOL) 500 MG tablet Take 1,000 mg by mouth every 6 (six) hours as needed (for pain.).    [provider]  acyclovir (ZOVIRAX) 400 MG tablet Take 400 mg by mouth 3 (three) times daily as needed (cold sore outbreaks).     [provider]  diphenhydrAMINE (BENADRYL) 25 mg capsule Take 1 capsule (25 mg total) by mouth every 8 (eight) hours as needed for itching. 10/30/16   Hillary Bow, MD  levofloxacin (LEVAQUIN) 750 MG tablet Take 1 tablet (750 mg total) by mouth daily. 10/30/16   Hillary Bow, MD  levothyroxine (SYNTHROID, LEVOTHROID) 100 MCG tablet Take 100 mcg by mouth daily before breakfast.    [provider]  lisinopril (PRINIVIL,ZESTRIL) 10 MG tablet Take 10 mg by mouth daily.    [provider]  ondansetron (ZOFRAN) 4 MG tablet Take 1 tablet (4 mg total) by mouth every 8 (eight) hours as needed for nausea or vomiting. 10/30/16   Hillary Bow, MD  oxyCODONE-acetaminophen (PERCOCET/ROXICET) 5-325 MG tablet Take 1 tablet by mouth every 6 (six) hours as needed for moderate pain. 10/30/16   Hillary Bow, MD  prochlorperazine (COMPAZINE) 10 MG tablet Take 1 tablet (10 mg total) by mouth every  6 (six) hours as needed (Nausea or vomiting). 10/05/16   Lloyd Huger, MD    Allergies Patient has no known allergies.  Family History  Problem Relation Age of Onset  . Breast cancer Neg Hx     Social History Social History  Substance Use Topics  . Smoking status: Never Smoker  . Smokeless tobacco: Never Used  . Alcohol use No    Review of Systems  Constitutional: fever Eyes: No visual changes. ENT:  sore throat. Cardiovascular: Denies chest pain. Respiratory: Denies shortness of  breath. Gastrointestinal: No abdominal pain.  No nausea, no vomiting.  No diarrhea.  No constipation. Genitourinary: Negative for dysuria. Musculoskeletal: Negative for back pain. Skin: Negative for rash. Neurological: Negative for headaches, focal weakness }  ____________________________________________   PHYSICAL EXAM:  VITAL SIGNS: ED Triage Vitals  Enc Vitals Group     BP 11/04/16 1822 (!) 135/52     Pulse Rate 11/04/16 1822 (!) 115     Resp 11/04/16 1822 16     Temp 11/04/16 1822 100.3 F (37.9 C)     Temp Source 11/04/16 1822 Oral     SpO2 11/04/16 1822 98 %     Weight 11/04/16 1821 157 lb (71.2 kg)     Height 11/04/16 1821 5' (1.524 m)     Head Circumference --      Peak Flow --      Pain Score 11/04/16 1820 7     Pain Loc --      Pain Edu? --      Excl. in [place]? --     Constitutional: Alert and oriented. Well appearing and in no acute distress. Eyes: Conjunctivae are normal.  Head: Atraumatic. Nose: No congestion/rhinnorhea. Mouth/Throat: Mucous membranes are moist.  Oropharynx non-erythematous. Neck: No stridor.   Cardiovascular: Normal rate, regular rhythm. Grossly normal heart sounds.  Good peripheral circulation. Respiratory: Normal respiratory effort.  No retractions. Lungs CTAB. Gastrointestinal: Soft and nontender. No distention. No abdominal bruits. No CVA tenderness. Musculoskeletal: No lower extremity tenderness nor edema.   Neurologic:  Normal speech and language. No gross focal neurologic deficits are appreciated. No gait instability. Skin:  Skin is warm, dry and intact. No rash noted. Psychiatric: Mood and affect are normal. Speech and behavior are normal.  ____________________________________________   LABS (all labs ordered are listed, but only abnormal results are displayed)  Labs Reviewed  COMPREHENSIVE METABOLIC PANEL - Abnormal; Notable for the following:       Result Value   Sodium 131 (*)    Chloride 98 (*)    Glucose, Bld 120 (*)     All other components within normal limits  CBC WITH DIFFERENTIAL/PLATELET - Abnormal; Notable for the following:    WBC 11.7 (*)    Platelets 99 (*)    Neutro Abs 9.7 (*)    Lymphs Abs 0.9 (*)    Monocytes Absolute 1.1 (*)    All other components within normal limits  URINALYSIS, COMPLETE (UACMP) WITH MICROSCOPIC - Abnormal; Notable for the following:    Color, Urine YELLOW (*)    APPearance CLEAR (*)    Squamous Epithelial / LPF 0-5 (*)    All other components within normal limits  LACTIC ACID, PLASMA  LACTIC ACID, PLASMA   ____________________________________________  EKG   ____________________________________________  RADIOLOGY  Dg Chest 2 View  Result Date: 11/04/2016 CLINICAL DATA:  Fever. EXAM: CHEST  2 VIEW COMPARISON:  October 26, 2016 FINDINGS: The heart size and mediastinal contours are  within normal limits. Both lungs are clear. The visualized skeletal structures are unremarkable. IMPRESSION: No active cardiopulmonary disease. Electronically Signed   By: Dorise Bullion III M.D   On: 11/04/2016 18:49    ____________________________________________   PROCEDURES  Procedure(s) performed:   Procedures  Critical Care performed:   ____________________________________________   INITIAL IMPRESSION / ASSESSMENT AND PLAN / ED COURSE  Pertinent labs & imaging results that were available during my care of the patient were reviewed by me and considered in my medical decision making (see chart for details).  Clinical Course as of Nov 04 1941  Tue Nov 04, 2016  1931 Glucose: [name] 120 [PM]    Clinical Course User Index [PM] Nena Polio, MD     ____________________________________________   FINAL CLINICAL IMPRESSION(S) / ED DIAGNOSES  Final diagnoses:  Acute pharyngitis, unspecified etiology      NEW MEDICATIONS STARTED DURING THIS VISIT:  New Prescriptions   No medications on file     Note:  This document was prepared using Dragon voice  recognition software and may include unintentional dictation errors.    Nena Polio, MD 11/04/16 [phone]

## 2016-11-04 NOTE — Discharge Instructions (Signed)
Take the penicillin one pill 4 times a day. Use Tylenol or Advil for fever. Return for higher fever or feeling sicker at all. Please also return if you develop any other symptoms like a productive cough, shortness of breath, aching all over, lightheadedness or sore throat bad enough that you can't swallow.

## 2016-11-04 NOTE — ED Triage Notes (Signed)
Arrives with c/o fever today.  Patient has been exposed to strep throat.  Fever 101 at 1600.  Patient has not taken any medications.  Patient is AAOx3.  Skin warm and dry. C/o sore throat and headache.

## 2016-11-04 NOTE — ED Notes (Signed)
Collected CBC, CMP, L AC, sent to lab.

## 2016-11-06 ENCOUNTER — Ambulatory Visit
Admission: RE | Admit: 2016-11-06 | Discharge: 2016-11-06 | Disposition: A | Discharge status: Home / Self Care | Payer: Medicaid Other | Source: Ambulatory Visit | Attending: Radiation Oncology | Admitting: Radiation Oncology

## 2016-11-06 VITALS — BP 145/86 | HR 101 | Temp 99.1°F | Resp 16 | Ht 61.0 in | Wt 162.0 lb

## 2016-11-06 DIAGNOSIS — Z170 Estrogen receptor positive status [ER+]: Secondary | ICD-10-CM | POA: Insufficient documentation

## 2016-11-06 DIAGNOSIS — J189 Pneumonia, unspecified organism: Secondary | ICD-10-CM | POA: Diagnosis not present

## 2016-11-06 DIAGNOSIS — Z923 Personal history of irradiation: Secondary | ICD-10-CM | POA: Insufficient documentation

## 2016-11-06 DIAGNOSIS — C50411 Malignant neoplasm of upper-outer quadrant of right female breast: Secondary | ICD-10-CM | POA: Diagnosis present

## 2016-11-06 NOTE — Progress Notes (Signed)
Patient here for 1 month follow up. She has an upper respiratory infection.

## 2016-11-06 NOTE — Progress Notes (Signed)
Radiation Oncology Follow up Note  Name: Wanda Reed   Date:   11/06/2016 MRN:  [id number] DOB: [birth year]    This 61 y.o. female presents to the clinic today for one-month follow-up status post accelerated partial breast irradiation to her right breast for stage I ER positive PR negative HER-2/neu negative invasive mammary carcinoma status post wide local excision.  REFERRING PROVIDER: Denton Lank, MD  HPI: Patient is a Spanish-speaking female now out 1 month having completed accelerated partial breast radiation to her right breast for stage I (T1 BN 0 M0) ER positive PR negative HER-2/neu negative invasive mammary carcinoma status post wide local excision and sentinel node biopsy. Unfortunately she suffering today from a pulmonary infection may be viral currently on antibiotic therapy. She states her throat is sore. She also states her breast is tender and has some significant erythematous changes over the site of the balloon catheter.. She is currently undergoing chemotherapy with Cytoxan and Taxotere. Her MammaPrint which showed high risk of recurrence.  COMPLICATIONS OF TREATMENT: none  FOLLOW UP COMPLIANCE: keeps appointments   PHYSICAL EXAM:  BP (!) 145/86 (BP Location: Right Wrist, Patient Position: Sitting, Cuff Size: Small)   Pulse (!) 101   Temp 99.1 F (37.3 C) (Tympanic)   Resp 16   Ht '5\' 1"'  (1.549 m)   Wt 162 lb 0.6 oz (73.5 kg)   BMI 30.62 kg/m  Right breast linear area of erythematous changes of the skin a little unusual with accelerated partial breast radiation although may be related to her current chemotherapy. Incision is well-healed no skin breakdown is noted. No dominant mass or nodularity is noted in either breast in 2 positions examined no axillary or supraclavicular adenopathy is identified. Lungs show some decreased breath sounds in both lung fields no rhonchi are noted. Well-developed well-nourished patient in NAD. HEENT reveals PERLA, EOMI, discs not  visualized.  Oral cavity is clear. No oral mucosal lesions are identified. Neck is clear without evidence of cervical or supraclavicular adenopathy. Lungs are clear to A&P. Cardiac examination is essentially unremarkable with regular rate and rhythm without murmur rub or thrill. Abdomen is benign with no organomegaly or masses noted. Motor sensory and DTR levels are equal and symmetric in the upper and lower extremities. Cranial nerves II through XII are grossly intact. Proprioception is intact. No peripheral adenopathy or edema is identified. No motor or sensory levels are noted. Crude visual fields are within normal range.  RADIOLOGY RESULTS: No current films for review  PLAN: At the present time patient is suffering from a pulmonary infection being treated accordingly with antibiotic therapy. She is also currently undergoing chemotherapy under medical oncology's direction. Slight exaggeration of erythematous changes of the skin over MammoSite balloon catheter may be related to her chemotherapy I've suggested conservative measures such as cortisone cream twice a day. Otherwise I've asked to see her back in 4-5 months for follow-up. She continues close follow-up care and chemotherapy with medical oncology.  I would like to take this opportunity to thank you for allowing me to participate in the care of your patient.Armstead Peaks., MD

## 2016-11-09 NOTE — Progress Notes (Signed)
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 High risk MammaPrint.  INTERVAL HISTORY: Patient returns to clinic today for further evaluation and consideration of cycle 2 of Taxotere and Cytoxan. She was admitted to the hospital for neutropenic fevers after her first infusion, but now feels well and is nearly back to her baseline. She continues to have residual weakness and fatigue. She continues to be anxious. She has no neurologic complaints. She has a good appetite and denies weight loss. She has no chest pain or shortness of breath. She denies any nausea, vomiting, constipation, or diarrhea. She has no urinary complaints. Patient offers no further specific complaints today.  REVIEW OF SYSTEMS:   Review of Systems  Constitutional: Positive for malaise/fatigue and weight loss. Negative for fever.  Respiratory: Negative.  Negative for cough and shortness of breath.   Cardiovascular: Negative.  Negative for chest pain and leg swelling.  Gastrointestinal: Negative.  Negative for abdominal pain, nausea and vomiting.  Genitourinary: Negative.   Musculoskeletal: Negative.   Skin: Negative.  Negative for rash.  Neurological: Positive for weakness.  Psychiatric/Behavioral: The patient is nervous/anxious.     As per HPI. Otherwise, a complete review of systems is negative.  PAST MEDICAL HISTORY: Past Medical History:  Diagnosis Date  . Anxiety   . Breast cancer of upper-outer quadrant of right female breast ([place]) 08/2016   1.0 cm ER+, PR-, Her 2 neu not  overexpressed. Node negative. T1b, N0.  [name] Cancer (HCC)    cervical  . Hypertension   . Thyroid disease     PAST SURGICAL HISTORY: Past Surgical History:  Procedure Laterality Date  . ABDOMINAL HYSTERECTOMY    . BREAST LUMPECTOMY Right 08/28/2016  . BREAST LUMPECTOMY WITH SENTINEL LYMPH NODE BIOPSY Right 08/28/2016   Procedure: BREAST LUMPECTOMY WITH SENTINEL LYMPH NODE BX;  Surgeon: [name], MD;  Location: [facility] ORS;  Service: General;  Laterality: Right;  . BREAST MAMMOSITE Right 09/15/2016   Procedure: MAMMOSITE BREAST;  Surgeon: [name], MD;  Location: [facility] ORS;  Service: General;  Laterality: Right;  . BREAST SURGERY Right    Breast Biopsy  . CERVIX SURGERY    . COLONOSCOPY  2008  . EYE SURGERY Bilateral    Cataract Extraction with IOL  . SHOULDER ARTHROSCOPY Right     FAMILY HISTORY: Family History  Problem Relation Age of Onset  . Breast cancer Neg Hx     ADVANCED DIRECTIVES (Y/N):  N  HEALTH MAINTENANCE: Social History  Substance Use Topics  . Smoking status: Never Smoker  . Smokeless tobacco: Never Used  . Alcohol use No     Colonoscopy:  PAP:  Bone density:  Lipid panel:  No Known Allergies  Current Outpatient Prescriptions  Medication Sig Dispense Refill  . acetaminophen (TYLENOL) 500 MG tablet Take 1,000 mg by mouth every 6 (six) hours as needed (for pain.).    [name] acyclovir (ZOVIRAX) 400 MG tablet Take 400 mg by mouth 3 (three) times daily as needed (cold sore outbreaks).     [name] levothyroxine (SYNTHROID, LEVOTHROID) 100 MCG tablet Take 100 mcg by mouth daily  before breakfast.    . lisinopril (PRINIVIL,ZESTRIL) 10 MG tablet Take 10 mg by mouth daily.    . penicillin v potassium (VEETID) 500 MG tablet Take 1 tablet (500 mg total) by mouth 4 (four) times daily. 40 tablet 0  . diphenhydrAMINE (BENADRYL) 25 mg capsule Take 1 capsule (25 mg total) by mouth every 8 (eight) hours as needed for itching. (Patient not taking: Reported on  11/12/2016) 30 capsule 0  . ondansetron (ZOFRAN) 4 MG tablet Take 1 tablet (4 mg total) by mouth every 8 (eight) hours as needed for nausea or vomiting. (Patient not taking: Reported on 11/12/2016) 30 tablet 0  . oxyCODONE-acetaminophen (PERCOCET/ROXICET) 5-325 MG tablet Take 1 tablet by mouth every 6 (six) hours as needed for moderate pain. (Patient not taking: Reported on 11/12/2016) 30 tablet 0  . prochlorperazine (COMPAZINE) 10 MG tablet Take 1 tablet (10 mg total) by mouth every 6 (six) hours as needed (Nausea or vomiting). (Patient not taking: Reported on 11/12/2016) 30 tablet 2   No current facility-administered medications for this visit.    Facility-Administered Medications Ordered in Other Visits  Medication Dose Route Frequency Provider Last Rate Last Dose  . cyclophosphamide (CYTOXAN) 1,000 mg in sodium chloride 0.9 % 250 mL chemo infusion  1,000 mg Intravenous Once Lloyd Huger, MD 600 mL/hr at 11/12/16 1312 1,000 mg at 11/12/16 1312  . pegfilgrastim (NEULASTA ONPRO KIT) injection 6 mg  6 mg Subcutaneous Once Lloyd Huger, MD        OBJECTIVE: Vitals:   11/12/16 0951  BP: 130/75  Pulse: 85  Temp: 98 F (36.7 C)     Body mass index is 29.65 kg/m.    ECOG FS:0 - Asymptomatic  General: Well-developed, well-nourished, no acute distress. Eyes: Pink conjunctiva, anicteric sclera. Breasts: Exam deferred today. Lungs: Clear to auscultation bilaterally. Heart: Regular rate and rhythm. No rubs, murmurs, or gallops. Abdomen: Soft, nontender, nondistended. No organomegaly noted, normoactive bowel sounds. Musculoskeletal: No edema, cyanosis, or clubbing. Neuro: Alert, answering all questions appropriately. Cranial nerves grossly intact. Skin: No rashes or petechiae noted. Psych: Normal affect.  LAB RESULTS:  Lab Results  Component Value Date   NA 133 (L) 11/12/2016   K 3.7 11/12/2016   CL 98 (L) 11/12/2016   CO2 27 11/12/2016   GLUCOSE 183 (H) 11/12/2016   BUN 14  11/12/2016   CREATININE 0.59 11/12/2016   CALCIUM 9.1 11/12/2016   PROT 7.4 11/12/2016   ALBUMIN 3.2 (L) 11/12/2016   AST 43 (H) 11/12/2016   ALT 17 11/12/2016   ALKPHOS 91 11/12/2016   BILITOT 0.4 11/12/2016   GFRNONAA >60 11/12/2016   GFRAA >60 11/12/2016    Lab Results  Component Value Date   WBC 3.2 (L) 11/12/2016   NEUTROABS 1.9 11/12/2016   HGB 11.6 (L) 11/12/2016   HCT 33.3 (L) 11/12/2016   MCV 92.8 11/12/2016   PLT 354 11/12/2016     STUDIES: Dg Chest 2 View  Result Date: 11/04/2016 CLINICAL DATA:  Fever. EXAM: CHEST  2 VIEW COMPARISON:  October 26, 2016 FINDINGS: The heart size and mediastinal contours are within normal limits. Both lungs are clear. The visualized skeletal structures are unremarkable. IMPRESSION: No active cardiopulmonary disease. Electronically Signed   By: Dorise Bullion III M.D   On: 11/04/2016 18:49   Dg Chest 2 View  Result Date: 10/26/2016 CLINICAL DATA:  61 year old female with history of body aches, headache and sore throat for the past 3 days. Undergoing chemotherapy for breast  cancer. EXAM: CHEST  2 VIEW COMPARISON:  Chest x-ray 08/28/2016. FINDINGS: Lung volumes are normal. No consolidative airspace disease. No pleural effusions. No pneumothorax. No pulmonary nodule or mass noted. Pulmonary vasculature and the cardiomediastinal silhouette are within normal limits. IMPRESSION: No radiographic evidence of acute cardiopulmonary disease. Electronically Signed   By: Vinnie Langton M.D.   On: 10/26/2016 20:28   Ct Soft Tissue Neck W Contrast  Result Date: 10/26/2016 CLINICAL DATA:  Neck pain.  Current treatment for breast cancer EXAM: CT NECK WITH CONTRAST TECHNIQUE: Multidetector CT imaging of the neck was performed using the standard protocol following the bolus administration of intravenous contrast. CONTRAST:  64m ISOVUE-300 IOPAMIDOL (ISOVUE-300) INJECTION 61% COMPARISON:  None. FINDINGS: Pharynx and larynx: Normal. No mass or swelling.  Salivary glands: No inflammation, mass, or stone. Thyroid: Small thyroid gland bilaterally without mass lesion. Lymph nodes: Negative Vascular: Negative Limited intracranial: Negative Visualized orbits: Negative Mastoids and visualized paranasal sinuses: Negative Skeleton: Negative for fracture or metastatic disease. Upper chest: Negative Other: None IMPRESSION: Negative CT neck Electronically Signed   By: CFranchot GalloM.D.   On: 10/26/2016 20:22   Ct Abdomen Pelvis W Contrast  Result Date: 10/28/2016 CLINICAL DATA:  New diagnosis of right breast cancer. EXAM: CT ABDOMEN AND PELVIS WITH CONTRAST TECHNIQUE: Multidetector CT imaging of the abdomen and pelvis was performed using the standard protocol following bolus administration of intravenous contrast. CONTRAST:  1054mISOVUE-300 IOPAMIDOL (ISOVUE-300) INJECTION 61% COMPARISON:  The chest CT 08/28/2016 FINDINGS: Lower chest: Bibasilar dependent subpleural atelectasis. No pleural effusion or pulmonary lesions. The heart is normal in size. No pericardial effusion. The distal esophagus and aorta are unremarkable. Hepatobiliary: Small low-attenuation liver lesions are stable and consistent with benign cysts. No worrisome hepatic lesions to suggest metastatic disease. The gallbladder is normal. No intra or extrahepatic biliary dilatation. The the liver contour is slightly irregular, the hepatic fissures are prominent and there is increased caudate to right lobe ratio. These findings suggest cirrhosis. The portal and hepatic veins are patent. For early portal venous collaterals are suspected. No esophageal varices. No splenomegaly. Pancreas: No mass, inflammation or ductal dilatation. Spleen: Normal size.  No focal lesions. Adrenals/Urinary Tract: The adrenal glands and kidneys are unremarkable. Small scattered cysts are noted. No obstructing ureteral calculi or bladder calculi. There are 2 small surgical clips noted near the right distal ureter. Stomach/Bowel: The  stomach, duodenum, small bowel and colon are grossly normal without oral contrast. No inflammatory changes, mass lesions or obstructive findings. The terminal ileum and appendix are normal. Vascular/Lymphatic: The aorta is normal in caliber. No dissection. The branch vessels are patent. The major venous structures are patent. No mesenteric or retroperitoneal mass or adenopathy. Small scattered lymph nodes are noted. Reproductive: Surgically absent. Other: Scattered surgical clips in the pelvis but no mass or adenopathy. No inguinal mass or adenopathy. Musculoskeletal: No significant bony findings. No definite findings for osseous metastatic disease. IMPRESSION: 1. No findings to suggest metastatic disease involving the abdomen/pelvis. 2. 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 No splenomegaly or ascites. No esophageal varices. 3. No abdominal/pelvic lymphadenopathy. 4. No findings for osseous metastatic disease. Electronically Signed   By: P.Marijo Sanes.D.   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 High risk MammaPrint.  PLAN:    1. Pathologic stage IA ER positive, PR/HER-2 negative, invasive carcinoma of  the upper outer quadrant of the right breast. High risk MammaPrint. Patient had lumpectomy on August 28, 2016 followed by MammoSite radiation therapy. Given her high risk MammaPrint, have recommended adjuvant chemotherapy. Proceed with cycle 2 of 4 of Taxotere and Cytoxan. She will require Neulasta support. Patient has difficult IV access, so therefore have referred for port placement.  At the conclusion of all her treatments, patient will benefit from an aromatase inhibitor given the ER status of her tumor. Return in 3 weeks for consideration of cycle 2.  2. Thrombocytopenia: Resolved. 3. Neutropenic fevers: Resolved. Neulasta  as above.  Approximately 30 minutes was spent in discussion of which greater than 50% was consultation.  Interpreter was present throughout the entire clinic visit.  Patient expressed understanding and was in agreement with this plan. She also understands that She can call clinic at any time with any questions, concerns, or complaints.   Cancer Staging Malignant neoplasm of upper-outer quadrant of right breast in female, estrogen receptor positive ([place]) Staging form: Breast, AJCC 8th Edition - Pathologic stage from 10/01/2016: Stage IA (pT1b, pN0, cM0, G2, ER: Positive, PR: Negative, HER2: Negative) - Signed by Lloyd Huger, MD on 10/01/2016   Lloyd Huger, MD   11/12/2016 1:15 PM

## 2016-11-11 ENCOUNTER — Other Ambulatory Visit: Payer: Self-pay | Admitting: Oncology

## 2016-11-12 ENCOUNTER — Inpatient Hospital Stay: Payer: Medicaid Other

## 2016-11-12 ENCOUNTER — Inpatient Hospital Stay (HOSPITAL_BASED_OUTPATIENT_CLINIC_OR_DEPARTMENT_OTHER): Payer: Medicaid Other | Admitting: Oncology

## 2016-11-12 VITALS — BP 130/75 | HR 85 | Temp 98.0°F | Wt 156.9 lb

## 2016-11-12 DIAGNOSIS — I10 Essential (primary) hypertension: Secondary | ICD-10-CM

## 2016-11-12 DIAGNOSIS — Z79899 Other long term (current) drug therapy: Secondary | ICD-10-CM | POA: Diagnosis not present

## 2016-11-12 DIAGNOSIS — C50411 Malignant neoplasm of upper-outer quadrant of right female breast: Secondary | ICD-10-CM

## 2016-11-12 DIAGNOSIS — Z170 Estrogen receptor positive status [ER+]: Secondary | ICD-10-CM

## 2016-11-12 DIAGNOSIS — R51 Headache: Secondary | ICD-10-CM | POA: Diagnosis not present

## 2016-11-12 DIAGNOSIS — M542 Cervicalgia: Secondary | ICD-10-CM

## 2016-11-12 DIAGNOSIS — D709 Neutropenia, unspecified: Secondary | ICD-10-CM | POA: Diagnosis not present

## 2016-11-12 DIAGNOSIS — F419 Anxiety disorder, unspecified: Secondary | ICD-10-CM

## 2016-11-12 DIAGNOSIS — E079 Disorder of thyroid, unspecified: Secondary | ICD-10-CM

## 2016-11-12 DIAGNOSIS — D696 Thrombocytopenia, unspecified: Secondary | ICD-10-CM | POA: Diagnosis not present

## 2016-11-12 DIAGNOSIS — R531 Weakness: Secondary | ICD-10-CM | POA: Diagnosis not present

## 2016-11-12 DIAGNOSIS — J029 Acute pharyngitis, unspecified: Secondary | ICD-10-CM | POA: Diagnosis not present

## 2016-11-12 DIAGNOSIS — R5383 Other fatigue: Secondary | ICD-10-CM | POA: Diagnosis not present

## 2016-11-12 LAB — COMPREHENSIVE METABOLIC PANEL
ALT: 17 U/L (ref 14–54)
AST: 43 U/L — ABNORMAL HIGH (ref 15–41)
Albumin: 3.2 g/dL — ABNORMAL LOW (ref 3.5–5.0)
Alkaline Phosphatase: 91 U/L (ref 38–126)
Anion gap: 8 (ref 5–15)
BUN: 14 mg/dL (ref 6–20)
CO2: 27 mmol/L (ref 22–32)
Calcium: 9.1 mg/dL (ref 8.9–10.3)
Chloride: 98 mmol/L — ABNORMAL LOW (ref 101–111)
Creatinine, Ser: 0.59 mg/dL (ref 0.44–1.00)
GFR calc Af Amer: >60 mL/min (ref >60)
GFR calc non Af Amer: >60 mL/min (ref >60)
Glucose, Bld: 183 mg/dL — ABNORMAL HIGH (ref 65–99)
Potassium: 3.7 mmol/L (ref 3.5–5.1)
Sodium: 133 mmol/L — ABNORMAL LOW (ref 135–145)
Total Bilirubin: 0.4 mg/dL (ref 0.3–1.2)
Total Protein: 7.4 g/dL (ref 6.5–8.1)

## 2016-11-12 LAB — CBC WITH DIFFERENTIAL/PLATELET
Basophils Absolute: 0.1 10*3/uL (ref 0–0.1)
Basophils Relative: 2 %
Eosinophils Absolute: 0.2 10*3/uL (ref 0–0.7)
Eosinophils Relative: 6 %
HCT: 33.3 % — ABNORMAL LOW (ref 35.0–47.0)
Hemoglobin: 11.6 g/dL — ABNORMAL LOW (ref 12.0–16.0)
Lymphocytes Relative: 24 %
Lymphs Abs: 0.8 10*3/uL — ABNORMAL LOW (ref 1.0–3.6)
MCH: 32.4 pg (ref 26.0–34.0)
MCHC: 35 g/dL (ref 32.0–36.0)
MCV: 92.8 fL (ref 80.0–100.0)
Monocytes Absolute: 0.3 10*3/uL (ref 0.2–0.9)
Monocytes Relative: 10 %
Neutro Abs: 1.9 10*3/uL (ref 1.4–6.5)
Neutrophils Relative %: 58 %
Platelets: 354 10*3/uL (ref 150–440)
RBC: 3.59 MIL/uL — ABNORMAL LOW (ref 3.80–5.20)
RDW: 12.4 % (ref 11.5–14.5)
WBC: 3.2 10*3/uL — ABNORMAL LOW (ref 3.6–11.0)

## 2016-11-12 MED ORDER — SODIUM CHLORIDE 0.9 % IV SOLN
Freq: Once | INTRAVENOUS | Status: AC
  Administered 2016-11-12: 11:00:00 via INTRAVENOUS
  Filled 2016-11-12: qty 1000

## 2016-11-12 MED ORDER — PEGFILGRASTIM 6 MG/0.6ML ~~LOC~~ PSKT
6.0000 mg | PREFILLED_SYRINGE | Freq: Once | SUBCUTANEOUS | Status: AC
  Administered 2016-11-12: 6 mg via SUBCUTANEOUS
  Filled 2016-11-12: qty 0.6

## 2016-11-12 MED ORDER — DEXAMETHASONE SODIUM PHOSPHATE 10 MG/ML IJ SOLN
10.0000 mg | Freq: Once | INTRAMUSCULAR | Status: AC
  Administered 2016-11-12: 10 mg via INTRAVENOUS
  Filled 2016-11-12: qty 1

## 2016-11-12 MED ORDER — DOCETAXEL CHEMO INJECTION 160 MG/16ML
75.0000 mg/m2 | Freq: Once | INTRAVENOUS | Status: AC
  Administered 2016-11-12: 130 mg via INTRAVENOUS
  Filled 2016-11-12: qty 13

## 2016-11-12 MED ORDER — PALONOSETRON HCL INJECTION 0.25 MG/5ML
0.2500 mg | Freq: Once | INTRAVENOUS | Status: AC
  Administered 2016-11-12: 0.25 mg via INTRAVENOUS
  Filled 2016-11-12: qty 5

## 2016-11-12 MED ORDER — SODIUM CHLORIDE 0.9 % IV SOLN
1000.0000 mg | Freq: Once | INTRAVENOUS | Status: AC
  Administered 2016-11-12: 1000 mg via INTRAVENOUS
  Filled 2016-11-12: qty 50
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
## 2016-11-12 NOTE — Progress Notes (Signed)
Patient is here for follow up, she mentions her right hand is dry and peeling.   She has lack of taste and more saliva

## 2016-11-13 ENCOUNTER — Telehealth: Payer: Self-pay | Admitting: *Deleted

## 2016-11-13 NOTE — Telephone Encounter (Signed)
I spoke with patient's daughter, Lenna Sciara, today about scheduling a port placement.   Arrangements will be made for the surgery to be done on 11-20-16 at [facility].

## 2016-11-14 ENCOUNTER — Other Ambulatory Visit: Payer: Self-pay | Admitting: General Surgery

## 2016-11-14 DIAGNOSIS — C50911 Malignant neoplasm of unspecified site of right female breast: Secondary | ICD-10-CM

## 2016-11-14 NOTE — Telephone Encounter (Signed)
Notified the patient's daughter Lenna Sciara that her mother's pre admit testing appointment is 11/18/16 at 8:30 am. She is aware that pre admit has moved to the 1st floor.

## 2016-11-18 ENCOUNTER — Encounter
Admission: RE | Admit: 2016-11-18 | Discharge: 2016-11-18 | Disposition: A | Discharge status: Home / Self Care | Payer: Medicaid Other | Source: Ambulatory Visit | Attending: General Surgery | Admitting: General Surgery

## 2016-11-18 DIAGNOSIS — Z01818 Encounter for other preprocedural examination: Secondary | ICD-10-CM | POA: Insufficient documentation

## 2016-11-18 HISTORY — DX: Hypothyroidism, unspecified: E03.9

## 2016-11-18 NOTE — Patient Instructions (Signed)
Your procedure is scheduled on: Thurs. 11/20/16 Su procedimiento est programado para: Report to Day Surgery. Presntese a: To find out your arrival time please call [phone] between 1PM - 3PM on Wed. 11/19/16. Para saber su hora de llegada por favor llame al [phone] entre la 1PM - 3PM el da:  Remember: Instructions that are not followed completely may result in serious medical risk, up to and including death, or upon the discretion of your surgeon and anesthesiologist your surgery may need to be rescheduled.  Recuerde: Las instrucciones que no se siguen completamente Heritage manager en un riesgo de salud grave, incluyendo hasta la [place] o a discrecin de su cirujano y Environmental health practitioner, su ciruga se puede posponer.   __x__ 1. Do not eat food or drink liquids after midnight. No gum chewing or hard candies.  No coma alimentos ni tome lquidos despus de la medianoche.  No mastique chicle ni caramelos  [place]. New Policy per interpretation Clear liquid for 2 hours before arrival time     _x___ 2. No alcohol for 24 hours before or after surgery.    No tome alcohol durante las 24 horas antes ni despus de la [place].   ____ 3. Bring all medications with you on the day of surgery if instructed.    Lleve todos los medicamentos con usted el da de su ciruga si se le ha indicado as.   __x__ 4. Notify your doctor if there is any change in your medical condition (cold, fever,                             infections).    Informe a su mdico si hay algn cambio en su condicin mdica (resfriado, fiebre, infecciones).   Do not wear jewelry, make-up, hairpins, clips or nail polish.  No use joyas, maquillajes, pinzas/ganchos para el cabello ni esmalte de uas.  Do not wear lotions, powders, or perfumes. You may wear deodorant.  No use lociones, polvos o perfumes.  Puede usar desodorante.    Do not shave 48 hours prior to surgery. Men may shave face and neck.  No se afeite 48 horas antes de la  [place].  Los hombres pueden [employer] cara y el cuello.   Do not bring valuables to the hospital.   No lleve objetos [facility] is not responsible for any belongings or valuables.  [place] no se hace responsable de ningn tipo de pertenencias u objetos de Geographical information systems officer.               Contacts, dentures or bridgework may not be worn into surgery.  Los lentes de [place], las dentaduras postizas o puentes no se pueden usar en la [place].  Leave your suitcase in the car. After surgery it may be brought to your room.  Deje su maleta en el auto.  Despus de la ciruga podr traerla a su habitacin.  For patients admitted to the hospital, discharge time is determined by your treatment team.  Para los pacientes que sean ingresados al hospital, el tiempo en el cual se le dar de alta es determinado por su                equipo de [place].   Patients discharged the day of surgery will not be allowed to drive home. A los pacientes que se les da de alta el mismo da de la ciruga no se les Engineer, production  a casa.   Please read over the following fact sheets that you were given: Por favor [name] informacin que le dieron:      _x___ Take these medicines the morning of surgery with A SIP OF WATER:          [employer] medicinas la maana de la ciruga con UN SORBO DE AGUA:  1.acetaminophen (TYLENOL) 500 MG tablet if needed  2. levothyroxine (SYNTHROID, LEVOTHROID) 100 MCG tablet  3.   4.       5.  6.  ____ Fleet Enema (as directed)          Enema de Fleet (segn lo indicado)    __x__ Use CHG Soap as directed          Utilice el jabn de CHG segn lo indicado  ____ Use inhalers on the day of surgery          Use los inhaladores el da de la ciruga  ____ Stop metformin 2 days prior to surgery          Deje de tomar el metformin 2 das antes de la ciruga    ____ Take 1/2 of usual insulin dose the night before surgery and none on the  morning of surgery           Tome la mitad de la dosis habitual de insulina la noche antes de la [place] y no tome nada en la maana de la             ciruga  ____ Stop Coumadin/Plavix/aspirin on           Deje de tomar el Coumadin/Plavix/aspirina el da:  ____ Stop Anti-inflammatories on          Deje de tomar antiinflamatorios el da:   ____ Stop supplements until after surgery            Deje de tomar suplementos hasta despus de la ciruga  ____ Bring C-Pap to the hospital          Porter al hospital

## 2016-11-19 MED ORDER — CEFAZOLIN SODIUM-DEXTROSE 2-4 GM/100ML-% IV SOLN
2.0000 g | INTRAVENOUS | Status: AC
  Administered 2016-11-20: 2 g via INTRAVENOUS

## 2016-11-20 ENCOUNTER — Ambulatory Visit: Payer: Medicaid Other | Admitting: Anesthesiology

## 2016-11-20 ENCOUNTER — Encounter: Payer: Self-pay | Admitting: Anesthesiology

## 2016-11-20 ENCOUNTER — Ambulatory Visit: Payer: Medicaid Other

## 2016-11-20 ENCOUNTER — Encounter
Admission: RE | Disposition: A | Discharge status: Home / Self Care | Payer: Self-pay | Source: Ambulatory Visit | Attending: General Surgery

## 2016-11-20 ENCOUNTER — Ambulatory Visit
Admission: RE | Admit: 2016-11-20 | Discharge: 2016-11-20 | Disposition: A | Discharge status: Home / Self Care | Payer: Medicaid Other | Source: Ambulatory Visit | Attending: General Surgery | Admitting: General Surgery

## 2016-11-20 DIAGNOSIS — I10 Essential (primary) hypertension: Secondary | ICD-10-CM | POA: Diagnosis not present

## 2016-11-20 DIAGNOSIS — Z79899 Other long term (current) drug therapy: Secondary | ICD-10-CM | POA: Insufficient documentation

## 2016-11-20 DIAGNOSIS — Z9221 Personal history of antineoplastic chemotherapy: Secondary | ICD-10-CM | POA: Insufficient documentation

## 2016-11-20 DIAGNOSIS — F419 Anxiety disorder, unspecified: Secondary | ICD-10-CM | POA: Diagnosis not present

## 2016-11-20 DIAGNOSIS — C50911 Malignant neoplasm of unspecified site of right female breast: Secondary | ICD-10-CM | POA: Diagnosis not present

## 2016-11-20 DIAGNOSIS — E039 Hypothyroidism, unspecified: Secondary | ICD-10-CM | POA: Diagnosis not present

## 2016-11-20 DIAGNOSIS — Y838 Other surgical procedures as the cause of abnormal reaction of the patient, or of later complication, without mention of misadventure at the time of the procedure: Secondary | ICD-10-CM | POA: Diagnosis not present

## 2016-11-20 DIAGNOSIS — I9742 Intraoperative hemorrhage and hematoma of a circulatory system organ or structure complicating other procedure: Secondary | ICD-10-CM | POA: Insufficient documentation

## 2016-11-20 DIAGNOSIS — C50411 Malignant neoplasm of upper-outer quadrant of right female breast: Secondary | ICD-10-CM | POA: Diagnosis not present

## 2016-11-20 DIAGNOSIS — Z95828 Presence of other vascular implants and grafts: Secondary | ICD-10-CM

## 2016-11-20 HISTORY — PX: PORTA CATH INSERTION: CATH118285

## 2016-11-20 LAB — CBC WITH DIFFERENTIAL/PLATELET
Band Neutrophils: 20 %
Basophils Absolute: 0 10*3/uL (ref 0–0.1)
Basophils Relative: 0 %
Blasts: 0 %
Eosinophils Absolute: 0 10*3/uL (ref 0–0.7)
Eosinophils Relative: 0 %
HCT: 35.2 % (ref 35.0–47.0)
Hemoglobin: 12 g/dL (ref 12.0–16.0)
Lymphocytes Relative: 12 %
Lymphs Abs: 3.6 10*3/uL (ref 1.0–3.6)
MCH: 31.6 pg (ref 26.0–34.0)
MCHC: 34.2 g/dL (ref 32.0–36.0)
MCV: 92.2 fL (ref 80.0–100.0)
Metamyelocytes Relative: 6 %
Monocytes Absolute: 3.6 10*3/uL — ABNORMAL HIGH (ref 0.2–0.9)
Monocytes Relative: 12 %
Myelocytes: 4 %
Neutro Abs: 23 10*3/uL — ABNORMAL HIGH (ref 1.4–6.5)
Neutrophils Relative %: 46 %
Other: 0 %
Platelets: 159 10*3/uL (ref 150–440)
Promyelocytes Absolute: 0 %
RBC: 3.82 MIL/uL (ref 3.80–5.20)
RDW: 13.4 % (ref 11.5–14.5)
WBC: 30.2 10*3/uL — ABNORMAL HIGH (ref 3.6–11.0)
nRBC: 0 /100 WBC

## 2016-11-20 SURGERY — PORTA CATH INSERTION
Anesthesia: Monitor Anesthesia Care | Laterality: Right | Wound class: Clean

## 2016-11-20 MED ORDER — PROPOFOL 10 MG/ML IV BOLUS
INTRAVENOUS | Status: DC | PRN
  Administered 2016-11-20 (×6): 20 mg via INTRAVENOUS

## 2016-11-20 MED ORDER — FENTANYL CITRATE (PF) 100 MCG/2ML IJ SOLN
INTRAMUSCULAR | Status: AC
  Filled 2016-11-20: qty 2

## 2016-11-20 MED ORDER — PROPOFOL 500 MG/50ML IV EMUL
INTRAVENOUS | Status: AC
  Filled 2016-11-20: qty 50

## 2016-11-20 MED ORDER — PROPOFOL 10 MG/ML IV BOLUS
INTRAVENOUS | Status: AC
  Filled 2016-11-20: qty 60

## 2016-11-20 MED ORDER — LIDOCAINE HCL (PF) 1 % IJ SOLN
INTRAMUSCULAR | Status: DC | PRN
  Administered 2016-11-20: 28 mL

## 2016-11-20 MED ORDER — FAMOTIDINE 20 MG PO TABS
ORAL_TABLET | ORAL | Status: AC
  Administered 2016-11-20: 20 mg via ORAL
  Filled 2016-11-20: qty 1

## 2016-11-20 MED ORDER — FENTANYL CITRATE (PF) 100 MCG/2ML IJ SOLN
INTRAMUSCULAR | Status: DC | PRN
  Administered 2016-11-20 (×2): 25 ug via INTRAVENOUS
  Administered 2016-11-20: 50 ug via INTRAVENOUS

## 2016-11-20 MED ORDER — LACTATED RINGERS IV SOLN
INTRAVENOUS | Status: DC
  Administered 2016-11-20 (×2): via INTRAVENOUS

## 2016-11-20 MED ORDER — MIDAZOLAM HCL 2 MG/2ML IJ SOLN
INTRAMUSCULAR | Status: DC | PRN
  Administered 2016-11-20 (×2): 1 mg via INTRAVENOUS

## 2016-11-20 MED ORDER — CEFAZOLIN SODIUM-DEXTROSE 2-4 GM/100ML-% IV SOLN
INTRAVENOUS | Status: AC
  Filled 2016-11-20: qty 100

## 2016-11-20 MED ORDER — ONDANSETRON HCL 4 MG/2ML IJ SOLN: 4.0000 mg | Freq: Once | INTRAMUSCULAR | Status: DC | PRN

## 2016-11-20 MED ORDER — SODIUM CHLORIDE 0.9 % IJ SOLN
INTRAMUSCULAR | Status: AC
  Filled 2016-11-20: qty 50

## 2016-11-20 MED ORDER — PHENYLEPHRINE HCL 10 MG/ML IJ SOLN
INTRAMUSCULAR | Status: DC | PRN
  Administered 2016-11-20 (×10): 100 ug via INTRAVENOUS

## 2016-11-20 MED ORDER — MIDAZOLAM HCL 2 MG/2ML IJ SOLN
INTRAMUSCULAR | Status: AC
  Filled 2016-11-20: qty 2

## 2016-11-20 MED ORDER — FENTANYL CITRATE (PF) 100 MCG/2ML IJ SOLN
INTRAMUSCULAR | Status: AC
  Administered 2016-11-20: 25 ug via INTRAVENOUS
  Filled 2016-11-20: qty 2

## 2016-11-20 MED ORDER — LIDOCAINE HCL (PF) 1 % IJ SOLN
INTRAMUSCULAR | Status: AC
  Filled 2016-11-20: qty 30

## 2016-11-20 MED ORDER — ONDANSETRON HCL 4 MG/2ML IJ SOLN
INTRAMUSCULAR | Status: DC | PRN
  Administered 2016-11-20: 4 mg via INTRAVENOUS

## 2016-11-20 MED ORDER — FENTANYL CITRATE (PF) 100 MCG/2ML IJ SOLN
25.0000 ug | INTRAMUSCULAR | Status: DC | PRN
  Administered 2016-11-20 (×2): 25 ug via INTRAVENOUS

## 2016-11-20 MED ORDER — FAMOTIDINE 20 MG PO TABS
20.0000 mg | ORAL_TABLET | Freq: Once | ORAL | Status: AC
  Administered 2016-11-20: 20 mg via ORAL

## 2016-11-20 MED ORDER — SODIUM CHLORIDE 0.9 % IJ SOLN
INTRAMUSCULAR | Status: DC | PRN
  Administered 2016-11-20: 25 mL

## 2016-11-20 SURGICAL SUPPLY — 29 items
BLADE SURG 15 STRL SS SAFETY (BLADE) ×4 IMPLANT
CHLORAPREP W/TINT 26ML (MISCELLANEOUS) ×4 IMPLANT
CLOSURE WOUND 1/2 X4 (GAUZE/BANDAGES/DRESSINGS) ×1
COVER LIGHT HANDLE STERIS (MISCELLANEOUS) ×8 IMPLANT
DECANTER SPIKE VIAL GLASS SM (MISCELLANEOUS) ×8 IMPLANT
DRAPE C-ARM XRAY 36X54 (DRAPES) ×4 IMPLANT
DRAPE LAPAROTOMY TRNSV 106X77 (MISCELLANEOUS) ×4 IMPLANT
DRSG TEGADERM 2-3/8X2-3/4 SM (GAUZE/BANDAGES/DRESSINGS) ×4 IMPLANT
DRSG TEGADERM 4X4.75 (GAUZE/BANDAGES/DRESSINGS) ×4 IMPLANT
DRSG TELFA 4X3 1S NADH ST (GAUZE/BANDAGES/DRESSINGS) ×4 IMPLANT
ELECT REM PT RETURN 9FT ADLT (ELECTROSURGICAL) ×4
ELECTRODE REM PT RTRN 9FT ADLT (ELECTROSURGICAL) ×2 IMPLANT
GLOVE BIO SURGEON STRL SZ7.5 (GLOVE) ×4 IMPLANT
GLOVE INDICATOR 8.0 STRL GRN (GLOVE) ×4 IMPLANT
GOWN STRL REUS W/ TWL LRG LVL3 (GOWN DISPOSABLE) ×4 IMPLANT
GOWN STRL REUS W/TWL LRG LVL3 (GOWN DISPOSABLE) ×4
KIT PORT POWER 8FR ISP CVUE (Miscellaneous) ×4 IMPLANT
KIT RM TURNOVER STRD PROC AR (KITS) ×4 IMPLANT
LABEL OR SOLS (LABEL) ×4 IMPLANT
NS IRRIG 500ML POUR BTL (IV SOLUTION) ×4 IMPLANT
PACK PORT-A-CATH (MISCELLANEOUS) ×4 IMPLANT
STRIP CLOSURE SKIN 1/2X4 (GAUZE/BANDAGES/DRESSINGS) ×3 IMPLANT
SUT PROLENE 3 0 SH DA (SUTURE) ×4 IMPLANT
SUT VIC AB 3-0 SH 27 (SUTURE) ×2
SUT VIC AB 3-0 SH 27X BRD (SUTURE) ×2 IMPLANT
SUT VIC AB 4-0 FS2 27 (SUTURE) ×4 IMPLANT
SWABSTK COMLB BENZOIN TINCTURE (MISCELLANEOUS) ×4 IMPLANT
SYR 10ML SLIP (SYRINGE) ×4 IMPLANT
SYRINGE 10CC LL (SYRINGE) ×4 IMPLANT

## 2016-11-20 NOTE — Anesthesia Postprocedure Evaluation (Signed)
Anesthesia Post Note  Patient: Wanda Reed  Procedure(s) Performed: Procedure(s) (LRB): PORTA CATH INSERTION (Right)  Patient location during evaluation: PACU Anesthesia Type: MAC Level of consciousness: awake and alert Pain management: pain level controlled Vital Signs Assessment: post-procedure vital signs reviewed and stable Respiratory status: spontaneous breathing, nonlabored ventilation, respiratory function stable and patient connected to nasal cannula oxygen Cardiovascular status: blood pressure returned to baseline and stable Postop Assessment: no signs of nausea or vomiting Anesthetic complications: no     Last Vitals:  Vitals:   11/20/16 0939 11/20/16 0956  BP: (!) 109/54 (!) 104/51  Pulse: 86   Resp:    Temp: 36.8 C   SpO2: 98%     Last Pain:  Vitals:   11/20/16 0910  TempSrc:   PainSc: 7                  Jackson Fetters S

## 2016-11-20 NOTE — H&P (Addendum)
Patient interviewed w/ Chip Boer from [employer]. No history of abdominal pain, history one loose stool last night ( 1 week post chemo RX). No constitutional symptoms. NO SOB/cough. Lungs: Clear. Cardio:RR. ABD: Soft, non-tender. With first chemo patient had no diarrhea. Did develop fever, platelet count at 64K. WBC at 2,100 with absolute neutrophil count of 1500. Should be OK to proceed w/ procedure. Will obtain CBC today.  Reviewed primary risk of procedure (pneumothorax) with patient and daughter.

## 2016-11-20 NOTE — OR Nursing (Signed)
Surgeon attempted to place port-a-cath on left side and was unsuccessful. Port- a- cath successfully placed on right side.

## 2016-11-20 NOTE — Anesthesia Preprocedure Evaluation (Addendum)
Anesthesia Evaluation  Patient identified by MRN, date of birth, ID band Patient awake    Reviewed: Allergy & Precautions, NPO status , Patient's Chart, lab work & pertinent test results, reviewed documented beta blocker date and time   Airway Mallampati: II  TM Distance: >3 FB     Dental  (+) Chipped   Pulmonary           Cardiovascular hypertension, Pt. on medications      Neuro/Psych Anxiety    GI/Hepatic   Endo/Other  Hypothyroidism   Renal/GU      Musculoskeletal   Abdominal   Peds  Hematology   Anesthesia Other Findings Hx of tachycardia. Lungs clear. No burning during urination. Mild diarrhea. Recheck of temp 98 deg.will proceed.  Reproductive/Obstetrics                            Anesthesia Physical Anesthesia Plan  ASA: III  Anesthesia Plan: MAC   Post-op Pain Management:    Induction:   PONV Risk Score and Plan:   Airway Management Planned:   Additional Equipment:   Intra-op Plan:   Post-operative Plan:   Informed Consent: I have reviewed the patients History and Physical, chart, labs and discussed the procedure including the risks, benefits and alternatives for the proposed anesthesia with the patient or authorized representative who has indicated his/her understanding and acceptance.     Plan Discussed with: CRNA  Anesthesia Plan Comments:         Anesthesia Quick Evaluation

## 2016-11-20 NOTE — Transfer of Care (Signed)
Immediate Anesthesia Transfer of Care Note  Patient: Wanda Reed  Procedure(s) Performed: Procedure(s): PORTA CATH INSERTION (Right)  Patient Location: PACU  Anesthesia Type:MAC  Level of Consciousness: awake, alert , oriented and patient cooperative  Airway & Oxygen Therapy: Patient Spontanous Breathing  Post-op Assessment: Report given to RN, Post -op Vital signs reviewed and stable and Patient moving all extremities  Post vital signs: Reviewed and stable  Last Vitals:  Vitals:   11/20/16 0639 11/20/16 0848  BP:  101/81  Pulse:  86  Resp:  14  Temp: 36.9 C (!) 36.3 C  SpO2:  99%    Last Pain:  Vitals:   11/20/16 0639  TempSrc: Oral         Complications: No apparent anesthesia complications

## 2016-11-20 NOTE — Discharge Instructions (Signed)
CIRUGIA AMBULATORIA       Instruccionnes de alta    Date Toma Copier) 6 de Septiembre 2018   1.  Las drogas que se Statistician en su cuerpo [employer], asi      que por las proximas 24 horas usted no debe:   Conducir Scientist, research (medical)) un automovil   Hacer ninguna decision legal   Tomar ninguna bebida alcoholica  2.  A) Manana puede comenzar una dieta regular.  Es mejor que hoy empiece con                    liquidos y gradualmente anada comidas solidas.       B) Puede comer cualquier comida que desee pero es mejor empezar con liquidos,               luego sopitas con galletas saladas y gradualmente llegar a las comidas solidas.  3.  Por favor avise a su medico inmediatamente si usted tiene algun sangrado anormal,       tiene dificultad con la respiracion, enrojecimiento y Social research officer, government en el sitio de la cirugia,     [place], fiebro o dolor que se alivia con [place].  4.  Istrucciones especificas :

## 2016-11-20 NOTE — Progress Notes (Signed)
She presents this morning for port placement reporting abdominal pain and "not feeling well". Temperature under 100.  Previous neutropenic fever with first cycle of chemotherapy. The patient's most recent chemotherapy treatment was 8 days ago. We'll obtain a CBC to see if this is recurrent neutropenic fever and if so cancel the procedure.

## 2016-11-20 NOTE — Op Note (Signed)
Preoperative diagnosis: Breast cancer requiring adjuvant chemotherapy.  Postoperative diagnosis: Same.  Operative procedure: Attempted left subclavian/left IJ PowerPort placement; placement right internal jugular PowerPort.  Operating surgeon: Ollen Bowl, M.D.  Anesthesia: Attended local, 28 mL 1% plain Xylocaine.  Estimated blood loss: Less than 5 mL.  Clinical note: This 62 year old recently was treated for right breast cancer and Mammoprint testing showed her to be at high risk for recurrence. She is taking her first 2 chemotherapy treatments but there is been great difficulty obtaining venous access. Central venous port was requested by the treating oncologist.  The patient received Kefzol prior to the procedure.  Operative note: The patient was placed supine on the table in the left arm tucked. The left neck chest and upper shoulder was prepped with ChloraPrep and draped. The ultrasound showed the left subclavian vein to be very small. Even approaching laterally it was less than a centimeter in diameter. Local anesthesia was infiltrated and attempted cannulation was not successful. At this time attention was turned to the internal jugular vein. This was slightly more prominent but attempted cannulation again was not successful. A small hematoma perhaps 1 cm in diameter was evident at the left lateral chest wall access site.  The patient was reprepped in the right neck and chest and draped. Ultrasound confirmed a widely patent internal jugular vein. Local anesthesia was infiltrated and the vein easily cannulated. The catheter was then inserted through the dilator port in position at the junction of the right atrium and SVC. A tunnel was made to a pocket on the right anterior chest. There was a kink in the catheter at the vein insertion site and the area superior to the vein insertion site was dilated to allow a gentle curve and allowed easily inflow of fluids and aspiration. The port was  tacked to the deep tissue with interrupted 3-0 Prolene sutures. Final irrigation was completed. The deep tissue was approximated with a running 3-Vicryl suture. Skin was closed with a running 4-0 Vicryl subcuticular suture at the port site and a simple 4-0 Vicryl subcuticular suture at the vein insertion site. Telfa and Tegaderm dressings were applied and Band-Aids to the left chest and neck lesions.  Correct chest x-ray in the recovery area showed no evidence of pneumothorax and catheter tip as described above.  The patient tolerated the procedure well.

## 2016-11-20 NOTE — Anesthesia Post-op Follow-up Note (Signed)
Anesthesia QCDR form completed.        

## 2016-11-26 ENCOUNTER — Telehealth: Payer: Self-pay | Admitting: *Deleted

## 2016-11-26 NOTE — Telephone Encounter (Addendum)
Daughter called to report that patient is having pain in her abdomen, back and side since yesterday has had low grade fever 99.1 yesterday normal for her is 97. She has no other symptoms. Please advise

## 2016-11-26 NOTE — Telephone Encounter (Signed)
Per Dr Rogue Bussing, have her take Tylenol and if it does not improve, call back. Melissa informed

## 2016-11-27 ENCOUNTER — Emergency Department: Payer: Medicaid Other

## 2016-11-27 ENCOUNTER — Encounter: Payer: Self-pay | Admitting: *Deleted

## 2016-11-27 ENCOUNTER — Inpatient Hospital Stay: Admit: 2016-11-27 | Payer: Self-pay | Admitting: Internal Medicine

## 2016-11-27 ENCOUNTER — Telehealth: Payer: Self-pay | Admitting: *Deleted

## 2016-11-27 ENCOUNTER — Inpatient Hospital Stay
Admission: EM | Admit: 2016-11-27 | Discharge: 2016-11-28 | DRG: 546 | Disposition: A | Discharge status: Home / Self Care | Payer: Medicaid Other | Attending: Specialist | Admitting: Specialist

## 2016-11-27 DIAGNOSIS — Z9221 Personal history of antineoplastic chemotherapy: Secondary | ICD-10-CM | POA: Diagnosis not present

## 2016-11-27 DIAGNOSIS — I10 Essential (primary) hypertension: Secondary | ICD-10-CM | POA: Diagnosis present

## 2016-11-27 DIAGNOSIS — N1330 Unspecified hydronephrosis: Secondary | ICD-10-CM | POA: Diagnosis present

## 2016-11-27 DIAGNOSIS — I776 Arteritis, unspecified: Principal | ICD-10-CM

## 2016-11-27 DIAGNOSIS — Z90710 Acquired absence of both cervix and uterus: Secondary | ICD-10-CM

## 2016-11-27 DIAGNOSIS — Z170 Estrogen receptor positive status [ER+]: Secondary | ICD-10-CM

## 2016-11-27 DIAGNOSIS — E871 Hypo-osmolality and hyponatremia: Secondary | ICD-10-CM | POA: Diagnosis present

## 2016-11-27 DIAGNOSIS — R1011 Right upper quadrant pain: Secondary | ICD-10-CM | POA: Diagnosis not present

## 2016-11-27 DIAGNOSIS — C50411 Malignant neoplasm of upper-outer quadrant of right female breast: Secondary | ICD-10-CM | POA: Diagnosis present

## 2016-11-27 DIAGNOSIS — R700 Elevated erythrocyte sedimentation rate: Secondary | ICD-10-CM | POA: Diagnosis present

## 2016-11-27 DIAGNOSIS — Z9889 Other specified postprocedural states: Secondary | ICD-10-CM

## 2016-11-27 DIAGNOSIS — E039 Hypothyroidism, unspecified: Secondary | ICD-10-CM | POA: Diagnosis present

## 2016-11-27 DIAGNOSIS — Z79899 Other long term (current) drug therapy: Secondary | ICD-10-CM | POA: Diagnosis not present

## 2016-11-27 LAB — URINALYSIS, COMPLETE (UACMP) WITH MICROSCOPIC
Bacteria, UA: NONE SEEN
Bilirubin Urine: NEGATIVE
Glucose, UA: NEGATIVE mg/dL
Hgb urine dipstick: NEGATIVE
Ketones, ur: NEGATIVE mg/dL
Leukocytes, UA: NEGATIVE
Nitrite: NEGATIVE
Protein, ur: NEGATIVE mg/dL
RBC / HPF: NONE SEEN RBC/hpf (ref 0–5)
Specific Gravity, Urine: 1.015 (ref 1.005–1.030)
pH: 7 (ref 5.0–8.0)

## 2016-11-27 LAB — COMPREHENSIVE METABOLIC PANEL
ALT: 16 U/L (ref 14–54)
AST: 35 U/L (ref 15–41)
Albumin: 3.3 g/dL — ABNORMAL LOW (ref 3.5–5.0)
Alkaline Phosphatase: 116 U/L (ref 38–126)
Anion gap: 7 (ref 5–15)
BUN: 12 mg/dL (ref 6–20)
CO2: 25 mmol/L (ref 22–32)
Calcium: 8.8 mg/dL — ABNORMAL LOW (ref 8.9–10.3)
Chloride: 100 mmol/L — ABNORMAL LOW (ref 101–111)
Creatinine, Ser: 0.68 mg/dL (ref 0.44–1.00)
GFR calc Af Amer: >60 mL/min (ref >60)
GFR calc non Af Amer: >60 mL/min (ref >60)
Glucose, Bld: 127 mg/dL — ABNORMAL HIGH (ref 65–99)
Potassium: 3.8 mmol/L (ref 3.5–5.1)
Sodium: 132 mmol/L — ABNORMAL LOW (ref 135–145)
Total Bilirubin: 0.7 mg/dL (ref 0.3–1.2)
Total Protein: 7.7 g/dL (ref 6.5–8.1)

## 2016-11-27 LAB — CBC WITH DIFFERENTIAL/PLATELET
Basophils Absolute: 0 10*3/uL (ref 0–0.1)
Basophils Relative: 0 %
Eosinophils Absolute: 0 10*3/uL (ref 0–0.7)
Eosinophils Relative: 0 %
HCT: 32.9 % — ABNORMAL LOW (ref 35.0–47.0)
Hemoglobin: 11.5 g/dL — ABNORMAL LOW (ref 12.0–16.0)
Lymphocytes Relative: 9 %
Lymphs Abs: 0.9 10*3/uL — ABNORMAL LOW (ref 1.0–3.6)
MCH: 32.2 pg (ref 26.0–34.0)
MCHC: 35 g/dL (ref 32.0–36.0)
MCV: 92.1 fL (ref 80.0–100.0)
Monocytes Absolute: 1 10*3/uL — ABNORMAL HIGH (ref 0.2–0.9)
Monocytes Relative: 10 %
Neutro Abs: 8.1 10*3/uL — ABNORMAL HIGH (ref 1.4–6.5)
Neutrophils Relative %: 81 %
Platelets: 108 10*3/uL — ABNORMAL LOW (ref 150–440)
RBC: 3.57 MIL/uL — ABNORMAL LOW (ref 3.80–5.20)
RDW: 13.6 % (ref 11.5–14.5)
WBC: 10 10*3/uL (ref 3.6–11.0)

## 2016-11-27 LAB — SEDIMENTATION RATE: Sed Rate: >140 mm/hr — ABNORMAL HIGH (ref 0–30)

## 2016-11-27 LAB — LIPASE, BLOOD: Lipase: 47 U/L (ref 11–51)

## 2016-11-27 MED ORDER — IOPAMIDOL (ISOVUE-300) INJECTION 61%
100.0000 mL | Freq: Once | INTRAVENOUS | Status: AC | PRN
  Administered 2016-11-27: 100 mL via INTRAVENOUS

## 2016-11-27 MED ORDER — DIPHENHYDRAMINE HCL 50 MG/ML IJ SOLN
INTRAMUSCULAR | Status: AC
  Administered 2016-11-27: 25 mg via INTRAVENOUS
  Filled 2016-11-27: qty 1

## 2016-11-27 MED ORDER — ACETAMINOPHEN 325 MG PO TABS
650.0000 mg | ORAL_TABLET | Freq: Once | ORAL | Status: AC
  Administered 2016-11-27: 650 mg via ORAL
  Filled 2016-11-27: qty 2

## 2016-11-27 MED ORDER — VANCOMYCIN HCL IN DEXTROSE 1-5 GM/200ML-% IV SOLN
1000.0000 mg | Freq: Once | INTRAVENOUS | Status: AC
  Administered 2016-11-27: 1000 mg via INTRAVENOUS
  Filled 2016-11-27: qty 200

## 2016-11-27 MED ORDER — SODIUM CHLORIDE 0.9 % IV BOLUS (SEPSIS)
1000.0000 mL | Freq: Once | INTRAVENOUS | Status: AC
  Administered 2016-11-27: 1000 mL via INTRAVENOUS

## 2016-11-27 MED ORDER — PIPERACILLIN-TAZOBACTAM 3.375 G IVPB 30 MIN
INTRAVENOUS | Status: AC
  Administered 2016-11-27: 3.375 g via INTRAVENOUS
  Filled 2016-11-27: qty 50

## 2016-11-27 MED ORDER — ONDANSETRON HCL 4 MG/2ML IJ SOLN
INTRAMUSCULAR | Status: AC
  Filled 2016-11-27: qty 2

## 2016-11-27 MED ORDER — MORPHINE SULFATE (PF) 4 MG/ML IV SOLN
4.0000 mg | Freq: Once | INTRAVENOUS | Status: AC
  Administered 2016-11-27: 4 mg via INTRAVENOUS
  Filled 2016-11-27: qty 1

## 2016-11-27 MED ORDER — METHYLPREDNISOLONE SODIUM SUCC 125 MG IJ SOLR
INTRAMUSCULAR | Status: AC
  Filled 2016-11-27: qty 2

## 2016-11-27 MED ORDER — ONDANSETRON HCL 4 MG/2ML IJ SOLN
4.0000 mg | Freq: Once | INTRAMUSCULAR | Status: AC
  Administered 2016-11-27: 4 mg via INTRAVENOUS
  Filled 2016-11-27: qty 2

## 2016-11-27 MED ORDER — PIPERACILLIN-TAZOBACTAM 3.375 G IVPB 30 MIN
3.3750 g | Freq: Once | INTRAVENOUS | Status: AC
  Administered 2016-11-27: 3.375 g via INTRAVENOUS

## 2016-11-27 MED ORDER — METHYLPREDNISOLONE SODIUM SUCC 125 MG IJ SOLR
125.0000 mg | Freq: Once | INTRAMUSCULAR | Status: AC
  Administered 2016-11-27: 125 mg via INTRAVENOUS
  Filled 2016-11-27: qty 2

## 2016-11-27 MED ORDER — DIPHENHYDRAMINE HCL 50 MG/ML IJ SOLN
25.0000 mg | Freq: Once | INTRAMUSCULAR | Status: AC
  Administered 2016-11-27: 25 mg via INTRAVENOUS

## 2016-11-27 MED ORDER — METOCLOPRAMIDE HCL 5 MG/ML IJ SOLN
10.0000 mg | Freq: Once | INTRAMUSCULAR | Status: AC
  Administered 2016-11-27: 10 mg via INTRAVENOUS

## 2016-11-27 MED ORDER — METOCLOPRAMIDE HCL 5 MG/ML IJ SOLN
INTRAMUSCULAR | Status: AC
  Administered 2016-11-27: 10 mg via INTRAVENOUS
  Filled 2016-11-27: qty 2

## 2016-11-27 MED ORDER — IOPAMIDOL (ISOVUE-300) INJECTION 61%
30.0000 mL | Freq: Once | INTRAVENOUS | Status: AC | PRN
  Administered 2016-11-27: 30 mL via ORAL

## 2016-11-27 NOTE — H&P (Addendum)
History and Physical   [facility] -  @ [facility] Admission History and Physical [employer], D.O.    Patient Name: Wanda Reed MR#: [id number] Date of Birth: 31-Oct-1955 Date of Admission: 11/27/2016  Referring MD/NP/PA: Dr. Cherylann Banas Primary Care Physician: Denton Lank, MD  Chief Complaint:  Chief Complaint  Patient presents with  . Abdominal Pain    HPI: Wanda Reed is a 61 y.o. female with a known history of breast cancer on chemotherapy with her last treatment being on August 29as well as a recent left subclavian port placed on September 6, hypertension, hypothyroidism and anxiety presents to the emergency department for evaluation of abdominal pain.  Patient was in a usual state of health until about 3-4 days ago when she describes the onset of right upper quadrant abdominal pain described as crampy, intermittent and associated with nausea and fever.  Patient denies chills, weakness, dizziness, chest pain, shortness of breath, vomiting, constipation, diarrhea, dysuria/frequency, changes in mental status.    Otherwise there has been no change in status. Patient has been taking medication as prescribed and there has been no recent change in medication or diet.  No recent antibiotics.  There has been no recent illness, hospitalizations, travel or sick contacts.    EMS/ED Course: Patient received Zosyn and Vanco. Medical admission has been requested for further management of aortitis.  Review of Systems:  CONSTITUTIONAL: positive fever.No , fatigue, weakness, weight gain/loss, headache. EYES: No blurry or double vision. ENT: No tinnitus, postnasal drip, redness or soreness of the oropharynx. RESPIRATORY: No cough, dyspnea, wheeze.  No hemoptysis.  CARDIOVASCULAR: No chest pain, palpitations, syncope, orthopnea. No lower extremity edema.  GASTROINTESTINAL: positive abdominal pain. History of present illness.No nausea, vomiting, diarrhea, constipation.  No  hematemesis, melena or hematochezia. GENITOURINARY: No dysuria, frequency, hematuria. ENDOCRINE: No polyuria or nocturia. No heat or cold intolerance. HEMATOLOGY: No anemia, bruising, bleeding. INTEGUMENTARY: No rashes, ulcers, lesions. MUSCULOSKELETAL: No arthritis, gout, dyspnea. NEUROLOGIC: No numbness, tingling, ataxia, seizure-type activity, weakness. PSYCHIATRIC: No anxiety, depression, insomnia.   Past Medical History:  Diagnosis Date  . Anxiety   . Breast cancer of upper-outer quadrant of right female breast ([place]) 08/2016   1.0 cm ER+, PR-, Her 2 neu not overexpressed. Node negative. T1b, N0.  [name] Cancer (HCC)    cervical  . Hypertension   . Hypothyroidism   . Thyroid disease     Past Surgical History:  Procedure Laterality Date  . ABDOMINAL HYSTERECTOMY    . BREAST LUMPECTOMY Right 08/28/2016  . BREAST LUMPECTOMY WITH SENTINEL LYMPH NODE BIOPSY Right 08/28/2016   Procedure: BREAST LUMPECTOMY WITH SENTINEL LYMPH NODE BX;  Surgeon: [name], MD;  Location: [facility] ORS;  Service: General;  Laterality: Right;  . BREAST MAMMOSITE Right 09/15/2016   Procedure: MAMMOSITE BREAST;  Surgeon: [name], MD;  Location: [facility] ORS;  Service: General;  Laterality: Right;  . BREAST SURGERY Right    Breast Biopsy  . CERVIX SURGERY    . COLONOSCOPY  2008  . EYE SURGERY Bilateral    Cataract Extraction with IOL  . PORTA CATH INSERTION Right 11/20/2016   Procedure: PORTA CATH INSERTION;  Surgeon: [name], MD;  Location: [facility] ORS;  Service: General;  Laterality: Right;  . SHOULDER ARTHROSCOPY Right      reports that she has never smoked. She has never used smokeless tobacco. She reports that she does not drink alcohol or use drugs.  No Known Allergies  Family History  Problem Relation Age  of Onset  . Breast cancer Neg Hx     Prior to Admission medications   Medication Sig Start Date End Date Taking? Authorizing Provider  acetaminophen (TYLENOL) 500 MG tablet  Take 1,000 mg by mouth every 6 (six) hours as needed (for pain.).   Yes [provider]  acyclovir (ZOVIRAX) 400 MG tablet Take 400 mg by mouth 3 (three) times daily as needed (cold sore outbreaks).    Yes [provider]  cetaphil (CETAPHIL) cream Apply 1 application topically as needed (dry skin).   Yes [provider]  docusate sodium (COLACE) 100 MG capsule Take 100 mg by mouth 2 (two) times daily.   Yes [provider]  levothyroxine (SYNTHROID, LEVOTHROID) 100 MCG tablet Take 100 mcg by mouth daily before breakfast.   Yes [provider]  lisinopril (PRINIVIL,ZESTRIL) 10 MG tablet Take 10 mg by mouth daily.   Yes [provider]  loratadine (CLARITIN) 10 MG tablet Take 10 mg by mouth as directed. Take 10 mg the day before, of, and after chemo   Yes [provider]  Multiple Vitamin (MULTIVITAMIN WITH MINERALS) TABS tablet Take 1 tablet by mouth daily.   Yes [provider]  ondansetron (ZOFRAN) 4 MG tablet Take 1 tablet (4 mg total) by mouth every 8 (eight) hours as needed for nausea or vomiting. Patient not taking: Reported on 11/27/2016 10/30/16   Hillary Bow, MD  oxyCODONE-acetaminophen (PERCOCET/ROXICET) 5-325 MG tablet Take 1 tablet by mouth every 6 (six) hours as needed for moderate pain. Patient not taking: Reported on 11/27/2016 10/30/16   Hillary Bow, MD  prochlorperazine (COMPAZINE) 10 MG tablet Take 1 tablet (10 mg total) by mouth every 6 (six) hours as needed (Nausea or vomiting). Patient not taking: Reported on 11/27/2016 10/05/16   Lloyd Huger, MD    Physical Exam: Vitals:   11/27/16 1830 11/27/16 2000 11/27/16 2030 11/27/16 2100  BP: (!) 146/122 (!) 141/65 (!) 126/58 (!) 127/57  Pulse: (!) 104 92 84 82  Resp: 17 12 16 18   Temp:  98.1 F (36.7 C)    TempSrc:      SpO2: 98% 99% 97% 98%  Weight:      Height:        GENERAL: 61 y.o.-year-old female patient, well-developed, well-nourished  lying in the bed in no acute distress.  Pleasant and cooperative.   HEENT: Head atraumatic, normocephalic. Pupils equal. Mucus membranes moist. NECK: Supple, full range of motion. No JVD, no bruit heard. No thyroid enlargement, no tenderness, no cervical lymphadenopathy. CHEST: Normal breath sounds bilaterally. No wheezing, rales, rhonchi or crackles. No use of accessory muscles of respiration.  No reproducible chest wall tenderness.  CARDIOVASCULAR: S1, S2 normal. No murmurs, rubs, or gallops. Cap refill <2 seconds. Pulses intact distally.  ABDOMEN: Soft, nondistended, mild midepigastric and right upper quadrant tenderness to palpation. No CVA tenderness.No rebound, guarding, rigidity. Normoactive bowel sounds present in all four quadrants.  EXTREMITIES: No pedal edema, cyanosis, or clubbing. No calf tenderness or Homan's sign.  NEUROLOGIC: The patient is alert and oriented x 3. Cranial nerves II through XII are grossly intact with no focal sensorimotor deficit. PSYCHIATRIC:  Normal affect, mood, thought content. SKIN: Warm, dry, and intact without obvious rash, lesion, or ulcer.    Labs on Admission:  CBC:  Recent Labs Lab 11/27/16 1611  WBC 10.0  NEUTROABS 8.1*  HGB 11.5*  HCT 32.9*  MCV 92.1  PLT 353*   Basic Metabolic Panel:  Recent Labs  Lab 11/27/16 1611  NA 132*  K 3.8  CL 100*  CO2 25  GLUCOSE 127*  BUN 12  CREATININE 0.68  CALCIUM 8.8*   GFR: Estimated Creatinine Clearance: 66 mL/min (by C-G formula based on SCr of 0.68 mg/dL). Liver Function Tests:  Recent Labs Lab 11/27/16 1611  AST 35  ALT 16  ALKPHOS 116  BILITOT 0.7  PROT 7.7  ALBUMIN 3.3*    Recent Labs Lab 11/27/16 1611  LIPASE 47   No results for input(s): AMMONIA in the last 168 hours. Coagulation Profile: No results for input(s): INR, PROTIME in the last 168 hours. Cardiac Enzymes: No results for input(s): CKTOTAL, CKMB, CKMBINDEX, TROPONINI in the last 168 hours. BNP (last 3  results) No results for input(s): PROBNP in the last 8760 hours. HbA1C: No results for input(s): HGBA1C in the last 72 hours. CBG: No results for input(s): GLUCAP in the last 168 hours. Lipid Profile: No results for input(s): CHOL, HDL, LDLCALC, TRIG, CHOLHDL, LDLDIRECT in the last 72 hours. Thyroid Function Tests: No results for input(s): TSH, T4TOTAL, FREET4, T3FREE, THYROIDAB in the last 72 hours. Anemia Panel: No results for input(s): VITAMINB12, FOLATE, FERRITIN, TIBC, IRON, RETICCTPCT in the last 72 hours. Urine analysis:    Component Value Date/Time   COLORURINE YELLOW 11/27/2016 1747   APPEARANCEUR CLEAR 11/27/2016 1747   LABSPEC 1.015 11/27/2016 1747   PHURINE 7.0 11/27/2016 1747   GLUCOSEU NEGATIVE 11/27/2016 1747   HGBUR NEGATIVE 11/27/2016 1747   BILIRUBINUR NEGATIVE 11/27/2016 1747   KETONESUR NEGATIVE 11/27/2016 1747   PROTEINUR NEGATIVE 11/27/2016 1747   NITRITE NEGATIVE 11/27/2016 1747   LEUKOCYTESUR NEGATIVE 11/27/2016 1747   Sepsis Labs: @LABRCNTIP (procalcitonin:4,lacticidven:4) )No results found for this or any previous visit (from the past 240 hour(s)).   Radiological Exams on Admission: Dg Chest 2 View  Result Date: 11/27/2016 CLINICAL DATA:  Pt has right side abd pain for 4 days. Pt also has a fever for days. No v/d Pt also reports back pain. Pt alert. Interpreter with pt. Hx breast cancer pt Last chemo was end of August, history of hypertension, hypothyroidism EXAM: CHEST  2 VIEW COMPARISON:  11/20/2016 FINDINGS: The cardiac silhouette is normal in size and configuration. No mediastinal or hilar masses. No evidence of adenopathy. Clear lungs.  No pleural effusion or pneumothorax. Right anterior chest wall, internal jugular Port-A-Cath is stable. Skeletal structures are intact. IMPRESSION: No active cardiopulmonary disease. Electronically Signed   By: Lajean Manes M.D.   On: 11/27/2016 17:50   Ct Abdomen Pelvis W Contrast  Result Date: 11/27/2016 CLINICAL  DATA:  RIGHT upper quadrant pain for 4 days, fever, back pain. History of breast and cervical cancer. EXAM: CT ABDOMEN AND PELVIS WITH CONTRAST TECHNIQUE: Multidetector CT imaging of the abdomen and pelvis was performed using the standard protocol following bolus administration of intravenous contrast. CONTRAST:  179mL ISOVUE-300 IOPAMIDOL (ISOVUE-300) INJECTION 61% COMPARISON:  CT abdomen and pelvis October 28, 2016 abdominal ultrasound November 27, 2016 FINDINGS: LOWER CHEST dependent atelectasis. Included heart size is normal. No pericardial effusion. HEPATOBILIARY: Nodular liver contour without solid mass. subcentimeter hypodensity in dome of the liver. Stable 2.2 cm cyst in LEFT lobe of the liver. PANCREAS: Normal. SPLEEN: Normal. ADRENALS/URINARY TRACT: Kidneys are orthotopic, demonstrating symmetric enhancement. No nephrolithiasis, or solid renal masses. Multifocal RIGHT renal scarring and mild atrophy and mild chronic hydronephrosis. Too small to characterize hypodensity lower pole RIGHT kidney. The unopacified ureters are normal in course and caliber. Delayed imaging through the kidneys demonstrates  symmetric prompt contrast excretion within the proximal urinary collecting system. Urinary bladder is well distended and unremarkable. Normal adrenal glands. STOMACH/BOWEL: The stomach, small and large bowel are normal in course and caliber without inflammatory changes. Mild colonic diverticulosis. Normal appendix. VASCULAR/LYMPHATIC: Aortoiliac vessels are normal in course and caliber. New irregular soft tissue about the distal thoracic and proximal suprarenal abdominal aorta with periaortic fat stranding. Mild calcific atherosclerosis. No lymphadenopathy by CT size criteria. Stable subcentimeter retroperitoneal lymph nodes. Status post pelvic side wall nodal dissection. REPRODUCTIVE: Status post hysterectomy. OTHER: No intraperitoneal free fluid or free air. MUSCULOSKELETAL: Nonacute. Grade 1 L4-5  anterolisthesis without spondylolysis. Anterior abdominal wall scarring. Moderate L2-3 disc osteophyte complex. IMPRESSION: 1. 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 Superior extent of involvement incompletely imaged. 2. 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 3. Mildly atrophic RIGHT kidney with mild chronic hydronephrosis. 4.  Aortic Atherosclerosis (ICD10-I70.0). Acute findings discussed with and reconfirmed by [facility] SIADECKI on 11/27/2016 at 7:23 pm. Electronically Signed   By: Elon Alas M.D.   On: 11/27/2016 19:29   US Abdomen Limited Ruq  Result Date: 11/27/2016 CLINICAL DATA:  Right upper quadrant pain. EXAM: ULTRASOUND ABDOMEN LIMITED RIGHT UPPER QUADRANT COMPARISON:  Abdominal CT 10/28/2016 FINDINGS: Gallbladder: No gallstones or wall thickening visualized. No sonographic Murphy sign noted by sonographer. Common bile duct: Diameter: 3 mm Liver: Simple 25 mm left hepatic cyst. There is heterogeneous echotexture this patient with cirrhotic changes on CT. Patent antegrade flow in the imaged hepatic and portal venous system. Moderate right hydronephrosis. A right ureteral jet was not visualized. IMPRESSION: 1. Right hydronephrosis. The bladder was scanned and a ureteral jet was never seen on the right. 2. Negative gallbladder. 3. Heterogeneous liver correlating with cirrhotic morphology on recent CT. Electronically Signed   By: Monte Fantasia M.D.   On: 11/27/2016 17:35    EKG: Sinus tachycardia at 114 bpm with normal axis and nonspecific ST-T wave changes.   Assessment/Plan  This is a 61 y.o. female with a history of breast cancer, hypertension, hypothyroidism now being admitted with:  #. Acute aortitis ?vasculitis vs. Drug-induced (chemo) Admit inpatient Continue vancomycin and Zosyn Check sedimentation rate Check echo Vascular consultation has been requested Consider cardiology consultation as indicated  #. Moderate right hydronephrosis  without evidence of urinary tract infection or renal stones Consider urology follow-up as outpatient  #. Hyponatremia, mild IV fluids and repeat BMP in a.m.  #. History of hypothyroidism - Continue Synthroid  #. History of hypertension - Continue lisinopril  Admission status: inpatient, telemetry IV Fluids: normal saline Diet/Nutrition: heart healthy Consults called: vascular  DVT Px: Lovenox, SCDs and early ambulation. Code Status: Full Code  Disposition Plan: To home in 1-2 days  All the records are reviewed and case discussed with ED provider. Management plans discussed with the patient and/or family who express understanding and agree with plan of care.  Danyle Boening D.O. on 11/27/2016 at 9:31 PM Between 7am to 6pm - Pager - [phone] After 6pm go to [email or web address] - Proofreader [facility] Office [phone] CC: Primary care physician; Denton Lank, MD   11/27/2016, 9:31 PM

## 2016-11-27 NOTE — ED Notes (Signed)
Dr. Cherylann Banas at the bedside for pt evaluation

## 2016-11-27 NOTE — ED Notes (Signed)
PT to CT.

## 2016-11-27 NOTE — ED Provider Notes (Signed)
[facility] Emergency Department Provider Note ____________________________________________   First MD Initiated Contact with Patient 11/27/16 1608     (approximate)  I have reviewed the triage vital signs and the nursing notes.   HISTORY  Chief Complaint Abdominal Pain    HPI Wanda Reed is a 61 y.o. female with a history of breast cancer on chemotherapy (last chemotherapy was approx August 28), also status post L subclavian port placement on 9/6 who presents withright upper and mid abdominal pain for 4 days, gradual onset, worsening, constant, associated with fever, and not associated with nausea, vomiting, or diarrhea. Patient also denies any urinary symptoms. Last bowel movement was earlier today and was normal. No prior hx of this pain.    Past Medical History:  Diagnosis Date  . Anxiety   . Breast cancer of upper-outer quadrant of right female breast ([place]) 08/2016   1.0 cm ER+, PR-, Her 2 neu not overexpressed. Node negative. T1b, N0.  [name] Cancer (HCC)    cervical  . Hypertension   . Hypothyroidism   . Thyroid disease     Patient Active Problem List   Diagnosis Date Noted  . Sepsis ([place]) 10/26/2016  . Hyperkalemia 08/28/2016  . Wide-complex tachycardia ([place]) 08/28/2016  . Malignant neoplasm of upper-outer quadrant of right breast in female, estrogen receptor positive ([place]) 08/18/2016    Past Surgical History:  Procedure Laterality Date  . ABDOMINAL HYSTERECTOMY    . BREAST LUMPECTOMY Right 08/28/2016  . BREAST LUMPECTOMY WITH SENTINEL LYMPH NODE BIOPSY Right 08/28/2016   Procedure: BREAST LUMPECTOMY WITH SENTINEL LYMPH NODE BX;  Surgeon: [name], MD;  Location: [facility] ORS;  Service: General;  Laterality: Right;  . BREAST MAMMOSITE Right 09/15/2016   Procedure: MAMMOSITE BREAST;  Surgeon: [name], MD;  Location: [facility] ORS;  Service: General;  Laterality: Right;  . BREAST SURGERY Right    Breast Biopsy  . CERVIX  SURGERY    . COLONOSCOPY  2008  . EYE SURGERY Bilateral    Cataract Extraction with IOL  . PORTA CATH INSERTION Right 11/20/2016   Procedure: PORTA CATH INSERTION;  Surgeon: [name], MD;  Location: [facility] ORS;  Service: General;  Laterality: Right;  . SHOULDER ARTHROSCOPY Right     Prior to Admission medications   Medication Sig Start Date End Date Taking? Authorizing Provider  acetaminophen (TYLENOL) 500 MG tablet Take 1,000 mg by mouth every 6 (six) hours as needed (for pain.).   Yes [provider]  acyclovir (ZOVIRAX) 400 MG tablet Take 400 mg by mouth 3 (three) times daily as needed (cold sore outbreaks).    Yes [provider]  cetaphil (CETAPHIL) cream Apply 1 application topically as needed (dry skin).   Yes [provider]  docusate sodium (COLACE) 100 MG capsule Take 100 mg by mouth 2 (two) times daily.   Yes [provider]  levothyroxine (SYNTHROID, LEVOTHROID) 100 MCG tablet Take 100 mcg by mouth daily before breakfast.   Yes [provider]  lisinopril (PRINIVIL,ZESTRIL) 10 MG tablet Take 10 mg by mouth daily.   Yes [provider]  loratadine (CLARITIN) 10 MG tablet Take 10 mg by mouth as directed. Take 10 mg the day before, of, and after chemo   Yes [provider]  Multiple Vitamin (MULTIVITAMIN WITH MINERALS) TABS tablet Take 1 tablet by mouth daily.   Yes [provider]  ondansetron (ZOFRAN) 4 MG tablet Take 1 tablet (4 mg total) by mouth every  8 (eight) hours as needed for nausea or vomiting. Patient not taking: Reported on 11/27/2016 10/30/16   Hillary Bow, MD  oxyCODONE-acetaminophen (PERCOCET/ROXICET) 5-325 MG tablet Take 1 tablet by mouth every 6 (six) hours as needed for moderate pain. Patient not taking: Reported on 11/27/2016 10/30/16   Hillary Bow, MD  prochlorperazine (COMPAZINE) 10 MG tablet Take 1 tablet (10 mg total) by mouth every 6 (six) hours as needed (Nausea or  vomiting). Patient not taking: Reported on 11/27/2016 10/05/16   Lloyd Huger, MD    Allergies Patient has no known allergies.  Family History  Problem Relation Age of Onset  . Breast cancer Neg Hx     Social History Social History  Substance Use Topics  . Smoking status: Never Smoker  . Smokeless tobacco: Never Used  . Alcohol use No    Review of Systems  Constitutional:Positive for fever Eyes: No visual changes. ENT: No sore throat. Cardiovascular: Denies chest pain. Respiratory: Denies shortness of breath. Gastrointestinal: No nausea, no vomiting.  No diarrhea.  Genitourinary: Negative for dysuria.   Musculoskeletal: Negative for back pain. Skin: Negative for rash. Neurological: Negative for headaches, focal weakness or numbness.   ____________________________________________   PHYSICAL EXAM:  VITAL SIGNS: ED Triage Vitals  Enc Vitals Group     BP 11/27/16 1557 124/63     Pulse Rate 11/27/16 1553 (!) 120     Resp 11/27/16 1553 20     Temp 11/27/16 1553 (!) 101.9 F (38.8 C)     Temp Source 11/27/16 1553 Oral     SpO2 11/27/16 1553 98 %     Weight 11/27/16 1555 154 lb (69.9 kg)     Height 11/27/16 1555 '5\' 1"'  (1.549 m)     Head Circumference --      Peak Flow --      Pain Score 11/27/16 1552 8     Pain Loc --      Pain Edu? --      Excl. in [place]? --     Constitutional: Alert and oriented. Relatively well appearing, in no acute distress. Eyes: Conjunctivae are normal. No scleral icterus.  Head: Atraumatic. Nose: No congestion/rhinnorhea. Mouth/Throat: Mucous membranes are moist.   Neck: Normal range of motion.  Cardiovascular: Normal rate, regular rhythm. Grossly normal heart sounds.  Good peripheral circulation. Respiratory: Normal respiratory effort.  No retractions. Lungs CTAB. Gastrointestinal: Soft with moderate RUQ and R mid abd tenderness. No distention.  Genitourinary: Mild R CVA tenderness. Musculoskeletal: No lower extremity edema.   Extremities warm and well perfused.  Neurologic:  Normal speech and language. No gross focal neurologic deficits are appreciated.  Skin:  Skin is warm and dry. No rash noted. Psychiatric: Mood and affect are normal. Speech and behavior are normal.  ____________________________________________   LABS (all labs ordered are listed, but only abnormal results are displayed)  Labs Reviewed  COMPREHENSIVE METABOLIC PANEL - Abnormal; Notable for the following:       Result Value   Sodium 132 (*)    Chloride 100 (*)    Glucose, Bld 127 (*)    Calcium 8.8 (*)    Albumin 3.3 (*)    All other components within normal limits  CBC WITH DIFFERENTIAL/PLATELET - Abnormal; Notable for the following:    RBC 3.57 (*)    Hemoglobin 11.5 (*)    HCT 32.9 (*)    Platelets 108 (*)    Neutro Abs 8.1 (*)    Lymphs Abs 0.9 (*)  Monocytes Absolute 1.0 (*)    All other components within normal limits  URINALYSIS, COMPLETE (UACMP) WITH MICROSCOPIC - Abnormal; Notable for the following:    Squamous Epithelial / LPF 0-5 (*)    All other components within normal limits  CULTURE, BLOOD (ROUTINE X 2)  CULTURE, BLOOD (ROUTINE X 2)  LIPASE, BLOOD  SEDIMENTATION RATE  C-REACTIVE PROTEIN   ____________________________________________  EKG  ED ECG REPORT I, Arta Silence, the attending physician, personally viewed and interpreted this ECG.  Date: 11/27/2016 EKG Time: 1608 Rate: 114 Rhythm: sinus tachycardia QRS Axis: normal Intervals: normal ST/T Wave abnormalities: normal Narrative Interpretation: no evidence of acute ischemia  ____________________________________________  RADIOLOGY  Chest XR: no acute findings RUQ sono: R hydronephrosis, negative gallbladder CT abd:  1. Acute aortitis involving the distal descending thoracic and upper  abdominal aorta. Superior extent of involvement incompletely imaged.  2. 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  3. Mildly atrophic RIGHT kidney with mild chronic  hydronephrosis.  4. Aortic Atherosclerosis     ____________________________________________   PROCEDURES  Procedure(s) performed: No    Critical Care performed: No ____________________________________________   INITIAL IMPRESSION / ASSESSMENT AND PLAN / ED COURSE  Pertinent labs & imaging results that were available during my care of the patient were reviewed by me and considered in my medical decision making (see chart for details).  61 year old female with past medical history of breast cancer and last chemotherapy approximately 2 weeks ago presents with right upper quadrant abdominal pain for 4 days, associated with fever.  On exam, patient is relatively well-appearing, tachycardic and febrile but other vital signs are normal. Tender in the right upper quadrant and right mid abdomen. No prior abdominal surgical history. Differential includes cholecystitis or other acute hepatobiliary causes, colitis, appendicitis, less likely viral syndrome or gastroenteritis. Plan: Labs, UA, IV fluids, analgesia, and will obtain right upper quadrant sono. If nondiagnostic, proceed to CT abdomen.  Clinical Course as of Nov 27 2116  Thu Nov 27, 2016  1946 US Abdomen Limited RUQ [SS]    Clinical Course User Index [SS] Arta Silence, MD   ----------------------------------------- 7:47 PM on 11/27/2016 -----------------------------------------  Right upper quadrant sono was negative so we proceeded with CT abdomen. CT shows acute aortitis, with no acute findings in the GI tract. Patient remains clinically stable with normal vital signs and is relatively comfortable appearing. Aortitis could be inflammatory and related to chemotherapy however also could be infectious. Will give broad-spectrum antibiotics, and admit.  ----------------------------------------- 9:17 PM on 11/27/2016 -----------------------------------------  I discussed case with Dr. Lucky Cowboy from vascular surgery for  additional recommendations; he recommends adding steroid, as well as obtain ESR and CRP.  I ordered these and signed out the patient to hospitalist, Dr. Ara Kussmaul.  ____________________________________________   FINAL CLINICAL IMPRESSION(S) / ED DIAGNOSES  Final diagnoses:  Right upper quadrant abdominal pain  Aortitis ([place])      NEW MEDICATIONS STARTED DURING THIS VISIT:  New Prescriptions   No medications on file     Note:  This document was prepared using Dragon voice recognition software and may include unintentional dictation errors.    Arta Silence, MD 11/27/16 2118

## 2016-11-27 NOTE — ED Notes (Signed)
Dr. Ara Kussmaul called and asked that pt be held in ED until she has been able to come evaluate pt.

## 2016-11-27 NOTE — Telephone Encounter (Signed)
Daughter called and said that her pain is not any better and wants something done

## 2016-11-27 NOTE — Telephone Encounter (Signed)
Patient in ER

## 2016-11-27 NOTE — ED Notes (Signed)
Facial redness has improved.

## 2016-11-27 NOTE — ED Triage Notes (Addendum)
Pt has right side abd pain for 4 days.  Pt also has a fever for days.  No v/d  Pt also reports back pain.  Pt alert.   Interpreter with pt.   Hx breast cancer pt   Last chemo was end of August.

## 2016-11-27 NOTE — ED Notes (Signed)
Redness noted to pt forehead, chin, and below her eyes. Denies itching. No swelling noted to her tongue or eyes. MD aware.

## 2016-11-27 NOTE — ED Notes (Signed)
EDP at bedside  

## 2016-11-27 NOTE — ED Notes (Signed)
Pt actively vomiting in exam room. MD aware. Order received.

## 2016-11-27 NOTE — ED Notes (Signed)
Per spanish intepeter pt has right sided abd pain that goes to her back for 4 days, states 4 days of fever, denies any nausea or vomiting

## 2016-11-28 DIAGNOSIS — I776 Arteritis, unspecified: Secondary | ICD-10-CM

## 2016-11-28 DIAGNOSIS — R1011 Right upper quadrant pain: Secondary | ICD-10-CM

## 2016-11-28 DIAGNOSIS — I10 Essential (primary) hypertension: Secondary | ICD-10-CM

## 2016-11-28 LAB — BASIC METABOLIC PANEL
Anion gap: 7 (ref 5–15)
BUN: 8 mg/dL (ref 6–20)
CO2: 25 mmol/L (ref 22–32)
Calcium: 8.5 mg/dL — ABNORMAL LOW (ref 8.9–10.3)
Chloride: 102 mmol/L (ref 101–111)
Creatinine, Ser: 0.47 mg/dL (ref 0.44–1.00)
GFR calc Af Amer: >60 mL/min (ref >60)
GFR calc non Af Amer: >60 mL/min (ref >60)
Glucose, Bld: 159 mg/dL — ABNORMAL HIGH (ref 65–99)
Potassium: 3.9 mmol/L (ref 3.5–5.1)
Sodium: 134 mmol/L — ABNORMAL LOW (ref 135–145)

## 2016-11-28 LAB — CBC
HCT: 33 % — ABNORMAL LOW (ref 35.0–47.0)
Hemoglobin: 11.4 g/dL — ABNORMAL LOW (ref 12.0–16.0)
MCH: 32 pg (ref 26.0–34.0)
MCHC: 34.5 g/dL (ref 32.0–36.0)
MCV: 92.7 fL (ref 80.0–100.0)
Platelets: 101 10*3/uL — ABNORMAL LOW (ref 150–440)
RBC: 3.56 MIL/uL — ABNORMAL LOW (ref 3.80–5.20)
RDW: 13.7 % (ref 11.5–14.5)
WBC: 9.7 10*3/uL (ref 3.6–11.0)

## 2016-11-28 LAB — SEDIMENTATION RATE: Sed Rate: 96 mm/hr — ABNORMAL HIGH (ref 0–30)

## 2016-11-28 LAB — MAGNESIUM: Magnesium: 1.9 mg/dL (ref 1.7–2.4)

## 2016-11-28 LAB — PHOSPHORUS: Phosphorus: 2.9 mg/dL (ref 2.5–4.6)

## 2016-11-28 LAB — C-REACTIVE PROTEIN: CRP: 9.8 mg/dL — ABNORMAL HIGH (ref <1.0)

## 2016-11-28 MED ORDER — PIPERACILLIN-TAZOBACTAM 3.375 G IVPB
3.3750 g | Freq: Three times a day (TID) | INTRAVENOUS | Status: DC
  Administered 2016-11-28 (×2): 3.375 g via INTRAVENOUS
  Filled 2016-11-28 (×2): qty 50

## 2016-11-28 MED ORDER — SODIUM CHLORIDE 0.9% FLUSH
3.0000 mL | Freq: Two times a day (BID) | INTRAVENOUS | Status: DC
  Administered 2016-11-28 (×2): 3 mL via INTRAVENOUS

## 2016-11-28 MED ORDER — METHYLPREDNISOLONE SODIUM SUCC 125 MG IJ SOLR: 60.0000 mg | INTRAMUSCULAR | Status: DC

## 2016-11-28 MED ORDER — ENOXAPARIN SODIUM 40 MG/0.4ML ~~LOC~~ SOLN: 40.0000 mg | SUBCUTANEOUS | Status: DC

## 2016-11-28 MED ORDER — ACYCLOVIR 400 MG PO TABS
400.0000 mg | ORAL_TABLET | Freq: Three times a day (TID) | ORAL | Status: DC | PRN
  Filled 2016-11-28: qty 1

## 2016-11-28 MED ORDER — NIVEA EX CREA
1.0000 "application " | TOPICAL_CREAM | CUTANEOUS | Status: DC | PRN
  Filled 2016-11-28: qty 120

## 2016-11-28 MED ORDER — IPRATROPIUM BROMIDE 0.02 % IN SOLN: 0.5000 mg | Freq: Four times a day (QID) | RESPIRATORY_TRACT | Status: DC | PRN

## 2016-11-28 MED ORDER — DOCUSATE SODIUM 100 MG PO CAPS
100.0000 mg | ORAL_CAPSULE | Freq: Two times a day (BID) | ORAL | Status: DC
  Administered 2016-11-28 (×2): 100 mg via ORAL
  Filled 2016-11-28 (×3): qty 1

## 2016-11-28 MED ORDER — OXYCODONE HCL 5 MG PO TABS: 5.0000 mg | ORAL_TABLET | ORAL | Status: DC | PRN

## 2016-11-28 MED ORDER — BISACODYL 5 MG PO TBEC: 5.0000 mg | DELAYED_RELEASE_TABLET | Freq: Every day | ORAL | Status: DC | PRN

## 2016-11-28 MED ORDER — SENNOSIDES-DOCUSATE SODIUM 8.6-50 MG PO TABS
1.0000 | ORAL_TABLET | Freq: Every evening | ORAL | Status: DC | PRN
Start: 2016-11-28 — End: 2016-11-28

## 2016-11-28 MED ORDER — ALBUTEROL SULFATE (2.5 MG/3ML) 0.083% IN NEBU: 2.5000 mg | INHALATION_SOLUTION | Freq: Four times a day (QID) | RESPIRATORY_TRACT | Status: DC | PRN

## 2016-11-28 MED ORDER — ONDANSETRON HCL 4 MG PO TABS: 4.0000 mg | ORAL_TABLET | Freq: Four times a day (QID) | ORAL | Status: DC | PRN

## 2016-11-28 MED ORDER — LORATADINE 10 MG PO TABS: 10.0000 mg | ORAL_TABLET | ORAL | Status: DC

## 2016-11-28 MED ORDER — ACETAMINOPHEN 500 MG PO TABS: 1000.0000 mg | ORAL_TABLET | Freq: Four times a day (QID) | ORAL | Status: DC | PRN

## 2016-11-28 MED ORDER — ONDANSETRON HCL 4 MG/2ML IJ SOLN: 4.0000 mg | Freq: Four times a day (QID) | INTRAMUSCULAR | Status: DC | PRN

## 2016-11-28 MED ORDER — ADULT MULTIVITAMIN W/MINERALS CH
1.0000 | ORAL_TABLET | Freq: Every day | ORAL | Status: DC
  Administered 2016-11-28: 1 via ORAL
  Filled 2016-11-28: qty 1

## 2016-11-28 MED ORDER — LEVOTHYROXINE SODIUM 100 MCG PO TABS
100.0000 ug | ORAL_TABLET | Freq: Every day | ORAL | Status: DC
  Administered 2016-11-28: 100 ug via ORAL
  Filled 2016-11-28: qty 1

## 2016-11-28 MED ORDER — VANCOMYCIN HCL IN DEXTROSE 1-5 GM/200ML-% IV SOLN
1000.0000 mg | Freq: Two times a day (BID) | INTRAVENOUS | Status: DC
  Administered 2016-11-28: 1000 mg via INTRAVENOUS
  Filled 2016-11-28 (×2): qty 200

## 2016-11-28 MED ORDER — METHYLPREDNISOLONE SODIUM SUCC 125 MG IJ SOLR
60.0000 mg | Freq: Four times a day (QID) | INTRAMUSCULAR | Status: DC
  Administered 2016-11-28 (×2): 60 mg via INTRAVENOUS
  Filled 2016-11-28 (×2): qty 2

## 2016-11-28 MED ORDER — SODIUM CHLORIDE 0.9 % IV SOLN
INTRAVENOUS | Status: DC
  Administered 2016-11-28: 02:00:00 via INTRAVENOUS

## 2016-11-28 MED ORDER — MAGNESIUM CITRATE PO SOLN
1.0000 | Freq: Once | ORAL | Status: DC | PRN
  Filled 2016-11-28: qty 296

## 2016-11-28 MED ORDER — LISINOPRIL 10 MG PO TABS
10.0000 mg | ORAL_TABLET | Freq: Every day | ORAL | Status: DC
  Administered 2016-11-28: 10 mg via ORAL
  Filled 2016-11-28: qty 1

## 2016-11-28 NOTE — Progress Notes (Signed)
Patient is being discharge home as per order, discharge instruction provided via an spanish interpreter juan [phone] to patient and family. No new medication order received , patient discharge home, IV removed tele removed

## 2016-11-28 NOTE — Progress Notes (Signed)
Pt admitted from ED video interpter used, orders, plan of care and assessment reviewed with [place]. Pt voiced understanding. All questions and concerns answered.

## 2016-11-28 NOTE — ED Notes (Addendum)
Dr. Ara Kussmaul made aware of pt facial redness after receiving antibiotic.

## 2016-11-28 NOTE — Consult Note (Signed)
Reason for Consult right upper quadrant abdominal pain   Referring Physician: Dr. Hortencia Pilar Wanda Reed is an 61 y.o. female.  HPI: This 61 year old Hispanic female who is currently undergoing adjuvant chemotherapy for breast cancer presented yesterday with complaint of significant right upper quadrant pain for about 4 days. She states prior to that she had had some minimal pain in that area but it became more intense in the last 4 days until yesterday when she was very uncomfortable and had some nausea and vomiting. She reports having had some fever at home although temperature was not documented. Patient was admitted for management of the pain even though the evaluation showed no parent source for her pain. There was evidence of aortitis for which patient was seen by Dr. Delana Meyer from vascular surgery. He did not feel this pain was related to that and suggested the surgery consult to rule out acalculous cholecystitis.  Past Medical History:  Diagnosis Date  . Anxiety   . Breast cancer of upper-outer quadrant of right female breast ([place]) 08/2016   1.0 cm ER+, PR-, Her 2 neu not overexpressed. Node negative. T1b, N0.  [name] ([facility])    cervical  . Hypertension   . Hypothyroidism   . Thyroid disease     Past Surgical History:  Procedure Laterality Date  . ABDOMINAL HYSTERECTOMY    . BREAST LUMPECTOMY Right 08/28/2016  . BREAST LUMPECTOMY WITH SENTINEL LYMPH NODE BIOPSY Right 08/28/2016   Procedure: BREAST LUMPECTOMY WITH SENTINEL LYMPH NODE BX;  Surgeon: [name], MD;  Location: [facility] OR[facility];  Service: General;  Laterality: Right;  . BREAST MAMMOSITE Right 09/15/2016   Procedure: MAMMOSITE BREAST;  Surgeon: [name], MD;  Location: [facility] OR[facility];  Service: General;  Laterality: Right;  . BREAST SURGERY Right    Breast Biopsy  . CERVIX SURGERY    . COLONOSCOPY  2008  . EYE SURGERY Bilateral    Cataract Extraction with IOL  . PORTA CATH INSERTION Right 11/20/2016   Procedure: PORTA CATH INSERTION;  Surgeon: [name], MD;  Location: [facility] OR[facility];  Service: General;  Laterality: Right;  . SHOULDER ARTHROSCOPY Right     Family History  Problem Relation Age of Onset  . Breast cancer Neg Hx     Social History:  reports that she has never smoked. She has never used smokeless tobacco. She reports that she does not drink alcohol or use drugs.  Allergies: No Known Allergies  Medications: I have reviewed the patient's current medications.  Results for orders placed or performed during the hospital encounter of 11/27/16 (from the past 48 hour(s))  Comprehensive metabolic panel     Status: Abnormal   Collection Time: 11/27/16  4:11 PM  Result Value Ref Range   Sodium 132 (L) 135 - 145 mmol/L   Potassium 3.8 3.5 - 5.1 mmol/L   Chloride 100 (L) 101 - 111 mmol/L   CO2 25 22 - 32 mmol/L   Glucose, Bld 127 (H) 65 - 99 mg/dL   BUN 12 6 - 20 mg/dL   Creatinine, Ser 0.68 0.44 - 1.00 mg/dL   Calcium 8.8 (L) 8.9 - 10.3 mg/dL   Total Protein 7.7 6.5 - 8.1 g/dL   Albumin 3.3 (L) 3.5 - 5.0 g/dL   AST 35 15 - 41 U/L   ALT 16 14 - 54 U/L   Alkaline Phosphatase 116 38 - 126 U/L   Total Bilirubin 0.7 0.3 - 1.2 mg/dL   GFR calc non Af  Amer >60 >60 mL/min   GFR calc Af Amer >60 >60 mL/min    Comment: (NOTE) The eGFR has been calculated using the CKD EPI equation. This calculation has not been validated in all clinical situations. eGFR's persistently <60 mL/min signify possible Chronic Kidney Disease.    Anion gap 7 5 - 15  CBC with Differential     Status: Abnormal   Collection Time: 11/27/16  4:11 PM  Result Value Ref Range   WBC 10.0 3.6 - 11.0 K/uL   RBC 3.57 (L) 3.80 - 5.20 MIL/uL   Hemoglobin 11.5 (L) 12.0 - 16.0 g/dL   HCT 32.9 (L) 35.0 - 47.0 %   MCV 92.1 80.0 - 100.0 fL   MCH 32.2 26.0 - 34.0 pg   MCHC 35.0 32.0 - 36.0 g/dL   RDW 13.6 11.5 - 14.5 %   Platelets 108 (L) 150 - 440 K/uL   Neutrophils Relative % 81 %   Neutro Abs 8.1 (H) 1.4 -  6.5 K/uL   Lymphocytes Relative 9 %   Lymphs Abs 0.9 (L) 1.0 - 3.6 K/uL   Monocytes Relative 10 %   Monocytes Absolute 1.0 (H) 0.2 - 0.9 K/uL   Eosinophils Relative 0 %   Eosinophils Absolute 0.0 0 - 0.7 K/uL   Basophils Relative 0 %   Basophils Absolute 0.0 0 - 0.1 K/uL  Lipase, blood     Status: None   Collection Time: 11/27/16  4:11 PM  Result Value Ref Range   Lipase 47 11 - 51 U/L  Culture, blood (routine x 2)     Status: None (Preliminary result)   Collection Time: 11/27/16  4:11 PM  Result Value Ref Range   Specimen Description BLOOD BLOOD LEFT HAND    Special Requests      BOTTLES DRAWN AEROBIC AND ANAEROBIC Blood Culture results may not be optimal due to an inadequate volume of blood received in culture bottles   Culture NO GROWTH < 24 HOURS    Report Status PENDING   Culture, blood (routine x 2)     Status: None (Preliminary result)   Collection Time: 11/27/16  4:32 PM  Result Value Ref Range   Specimen Description BLOOD BLOOD LEFT HAND    Special Requests      BOTTLES DRAWN AEROBIC AND ANAEROBIC Blood Culture results may not be optimal due to an inadequate volume of blood received in culture bottles   Culture NO GROWTH < 24 HOURS    Report Status PENDING   Urinalysis, Complete w Microscopic     Status: Abnormal   Collection Time: 11/27/16  5:47 PM  Result Value Ref Range   Color, Urine YELLOW YELLOW   APPearance CLEAR CLEAR   Specific Gravity, Urine 1.015 1.005 - 1.030   pH 7.0 5.0 - 8.0   Glucose, UA NEGATIVE NEGATIVE mg/dL   Hgb urine dipstick NEGATIVE NEGATIVE   Bilirubin Urine NEGATIVE NEGATIVE   Ketones, ur NEGATIVE NEGATIVE mg/dL   Protein, ur NEGATIVE NEGATIVE mg/dL   Nitrite NEGATIVE NEGATIVE   Leukocytes, UA NEGATIVE NEGATIVE   Squamous Epithelial / LPF 0-5 (A) NONE SEEN   WBC, UA 0-5 0 - 5 WBC/hpf   RBC / HPF NONE SEEN 0 - 5 RBC/hpf   Bacteria, UA NONE SEEN NONE SEEN  Sedimentation rate     Status: Abnormal   Collection Time: 11/27/16 10:19 PM    Result Value Ref Range   Sed Rate >140 (H) 0 - 30 mm/hr  C-reactive  protein     Status: Abnormal   Collection Time: 11/27/16 10:19 PM  Result Value Ref Range   CRP 9.8 (H) <1.0 mg/dL    Comment: Performed at [facility], [place] [address], [place], [place] [address]  Basic metabolic panel     Status: Abnormal   Collection Time: 11/28/16  1:36 AM  Result Value Ref Range   Sodium 134 (L) 135 - 145 mmol/L   Potassium 3.9 3.5 - 5.1 mmol/L   Chloride 102 101 - 111 mmol/L   CO2 25 22 - 32 mmol/L   Glucose, Bld 159 (H) 65 - 99 mg/dL   BUN 8 6 - 20 mg/dL   Creatinine, Ser 0.47 0.44 - 1.00 mg/dL   Calcium 8.5 (L) 8.9 - 10.3 mg/dL   GFR calc non Af Amer >60 >60 mL/min   GFR calc Af Amer >60 >60 mL/min    Comment: (NOTE) The eGFR has been calculated using the CKD EPI equation. This calculation has not been validated in all clinical situations. eGFR's persistently <60 mL/min signify possible Chronic Kidney Disease.    Anion gap 7 5 - 15  CBC     Status: Abnormal   Collection Time: 11/28/16  1:36 AM  Result Value Ref Range   WBC 9.7 3.6 - 11.0 K/uL   RBC 3.56 (L) 3.80 - 5.20 MIL/uL   Hemoglobin 11.4 (L) 12.0 - 16.0 g/dL   HCT 33.0 (L) 35.0 - 47.0 %   MCV 92.7 80.0 - 100.0 fL   MCH 32.0 26.0 - 34.0 pg   MCHC 34.5 32.0 - 36.0 g/dL   RDW 13.7 11.5 - 14.5 %   Platelets 101 (L) 150 - 440 K/uL  Sedimentation rate     Status: Abnormal   Collection Time: 11/28/16  1:36 AM  Result Value Ref Range   Sed Rate 96 (H) 0 - 30 mm/hr  Magnesium     Status: None   Collection Time: 11/28/16  1:36 AM  Result Value Ref Range   Magnesium 1.9 1.7 - 2.4 mg/dL  Phosphorus     Status: None   Collection Time: 11/28/16  1:36 AM  Result Value Ref Range   Phosphorus 2.9 2.5 - 4.6 mg/dL    Dg Chest 2 View  Result Date: 11/27/2016 CLINICAL DATA:  Pt has right side abd pain for 4 days. Pt also has a fever for days. No v/d Pt also reports back pain. Pt alert. Interpreter with pt. Hx breast cancer pt  Last chemo was end of August, history of hypertension, hypothyroidism EXAM: CHEST  2 VIEW COMPARISON:  11/20/2016 FINDINGS: The cardiac silhouette is normal in size and configuration. No mediastinal or hilar masses. No evidence of adenopathy. Clear lungs.  No pleural effusion or pneumothorax. Right anterior chest wall, internal jugular Port-A-Cath is stable. Skeletal structures are intact. IMPRESSION: No active cardiopulmonary disease. Electronically Signed   By: Lajean Manes M.D.   On: 11/27/2016 17:50   Ct Abdomen Pelvis W Contrast  Result Date: 11/27/2016 CLINICAL DATA:  RIGHT upper quadrant pain for 4 days, fever, back pain. History of breast and cervical cancer. EXAM: CT ABDOMEN AND PELVIS WITH CONTRAST TECHNIQUE: Multidetector CT imaging of the abdomen and pelvis was performed using the standard protocol following bolus administration of intravenous contrast. CONTRAST:  15m ISOVUE-300 IOPAMIDOL (ISOVUE-300) INJECTION 61% COMPARISON:  CT abdomen and pelvis October 28, 2016 abdominal ultrasound November 27, 2016 FINDINGS: LOWER CHEST dependent atelectasis. Included heart size is normal. No pericardial  effusion. HEPATOBILIARY: Nodular liver contour without solid mass. subcentimeter hypodensity in dome of the liver. Stable 2.2 cm cyst in LEFT lobe of the liver. PANCREAS: Normal. SPLEEN: Normal. ADRENALS/URINARY TRACT: Kidneys are orthotopic, demonstrating symmetric enhancement. No nephrolithiasis, or solid renal masses. Multifocal RIGHT renal scarring and mild atrophy and mild chronic hydronephrosis. Too small to characterize hypodensity lower pole RIGHT kidney. The unopacified ureters are normal in course and caliber. Delayed imaging through the kidneys demonstrates symmetric prompt contrast excretion within the proximal urinary collecting system. Urinary bladder is well distended and unremarkable. Normal adrenal glands. STOMACH/BOWEL: The stomach, small and large bowel are normal in course and caliber  without inflammatory changes. Mild colonic diverticulosis. Normal appendix. VASCULAR/LYMPHATIC: Aortoiliac vessels are normal in course and caliber. New irregular soft tissue about the distal thoracic and proximal suprarenal abdominal aorta with periaortic fat stranding. Mild calcific atherosclerosis. No lymphadenopathy by CT size criteria. Stable subcentimeter retroperitoneal lymph nodes. Status post pelvic side wall nodal dissection. REPRODUCTIVE: Status post hysterectomy. OTHER: No intraperitoneal free fluid or free air. MUSCULOSKELETAL: Nonacute. Grade 1 L4-5 anterolisthesis without spondylolysis. Anterior abdominal wall scarring. Moderate L2-3 disc osteophyte complex. IMPRESSION: 1. 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 Superior extent of involvement incompletely imaged. 2. 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 3. Mildly atrophic RIGHT kidney with mild chronic hydronephrosis. 4.  Aortic Atherosclerosis (ICD10-I70.0). Acute findings discussed with and reconfirmed by [facility] SIADECKI on 11/27/2016 at 7:23 pm. Electronically Signed   By: Elon Alas M.D.   On: 11/27/2016 19:29   US Abdomen Limited Ruq  Result Date: 11/27/2016 CLINICAL DATA:  Right upper quadrant pain. EXAM: ULTRASOUND ABDOMEN LIMITED RIGHT UPPER QUADRANT COMPARISON:  Abdominal CT 10/28/2016 FINDINGS: Gallbladder: No gallstones or wall thickening visualized. No sonographic Murphy sign noted by sonographer. Common bile duct: Diameter: 3 mm Liver: Simple 25 mm left hepatic cyst. There is heterogeneous echotexture this patient with cirrhotic changes on CT. Patent antegrade flow in the imaged hepatic and portal venous system. Moderate right hydronephrosis. A right ureteral jet was not visualized. IMPRESSION: 1. Right hydronephrosis. The bladder was scanned and a ureteral jet was never seen on the right. 2. Negative gallbladder. 3. Heterogeneous liver correlating with cirrhotic morphology on recent CT.  Electronically Signed   By: Monte Fantasia M.D.   On: 11/27/2016 17:35    Review of Systems  Constitutional: Negative.   Respiratory: Negative.   Cardiovascular: Negative.   Gastrointestinal: Positive for abdominal pain, nausea and vomiting. Negative for constipation, diarrhea and heartburn.  Genitourinary: Negative.    Blood pressure (!) 128/53, pulse 75, temperature 98 F (36.7 C), temperature source Oral, resp. rate 18, height '5\' 1"'  (1.549 m), weight 154 lb 3.2 oz (69.9 kg), SpO2 100 %. Physical Exam  Constitutional: She is oriented to person, place, and time. She appears well-developed and well-nourished.  Eyes: Conjunctivae are normal. No scleral icterus.  Neck: Neck supple.  Cardiovascular: Normal rate and regular rhythm.   GI: Soft. Bowel sounds are normal. She exhibits no mass. There is no hepatomegaly. There is tenderness in the right upper quadrant. No hernia.    Neurological: She is alert and oriented to person, place, and time.  Skin: Skin is warm and dry.    Assessment/Plan: Patient's CT scan and ultrasound labs were all reviewed. It does not appear the patient has a biliary source for her right upper quadrant pain based on current findings. Remote possibility that she may have a chronic cystic duct obstruction accounting for the pain but with  acute exacerbation of pain as noted by patient on expected see some evidence of cholecystitis on the CT and/or ultrasound. Her liver functions and lipase are normal as also her white count  I do not think there is any urgency to getting a HIDA scan but certainly this can be done in an outpatient setting.  All of the above discussed in full with the patient through her daughter who spoke [place]. This was also discussed with Dr.Sainani,patient's attendin  Thank you for allowing me to evaluate and help in the care of this patient  Christene Lye 11/28/2016, 1:52 PM

## 2016-11-28 NOTE — Discharge Summary (Signed)
[facility] NAME: Wanda Reed    MR#:  [id number]  DATE OF BIRTH:  [birth year]  DATE OF ADMISSION:  11/27/2016 ADMITTING PHYSICIAN: Harvie Bridge, DO  DATE OF DISCHARGE: 11/28/2016  PRIMARY CARE PHYSICIAN: Denton Lank, MD    ADMISSION DIAGNOSIS:  Aortitis ([place]) [I77.6] Right upper quadrant abdominal pain [R10.11]  DISCHARGE DIAGNOSIS:  Active Problems:   Aortitis ([place])   SECONDARY DIAGNOSIS:   Past Medical History:  Diagnosis Date  . Anxiety   . Breast cancer of upper-outer quadrant of right female breast ([place]) 08/2016   1.0 cm ER+, PR-, Her 2 neu not overexpressed. Node negative. T1b, N0.  [name] ([facility])    cervical  . Hypertension   . Hypothyroidism   . Thyroid disease     HOSPITAL COURSE:   61 year old female with past medical history of breast cancer, hypertension, hypothyroidism, anxiety who presented to the hospital due to abdominal pain and noted to have CT scan of the abdomen findings chest of aortitis.  1. Abdominal pain-etiology unclear presently. Patient CT scan of the abdomen and pelvis on admission which showed evidence of acute aortitis. -patient had a elevated sedimentation rate and CRP and therefore started on high-dose IV steroids. A vascular surgery and also general surgery consult was obtained. Patient was seen by vascular surgery and they did not recommend any acute intervention but recommending repeating her CT scan in 3 months from now. They also recommended outpatient rheumatology referral for ruling out vasculitis. Patient is being referred to rheumatology as outpatient. - A general surgery consult was also obtained as per them patient has no evidence of acute cholecystitis. Her lipase and LFTs are normal. Right upper quadrant ultrasound showed no evidence of cholelithiasis.They recommended possible outpatient HIDA scan if her pain continues to persist.  2.Essential hypertension-patient will  continue her lisinopril.  3. Hypothyroidism-continue Synthroid.  4. History of breast cancer-continue follow-up with oncology as an outpatient.  DISCHARGE CONDITIONS:   Stable.   CONSULTS OBTAINED:  Treatment Team:  Algernon Huxley, MD [name], MD  DRUG ALLERGIES:  No Known Allergies  DISCHARGE MEDICATIONS:   Allergies as of 11/28/2016   No Known Allergies     Medication List    STOP taking these medications   ondansetron 4 MG tablet Commonly known as:  ZOFRAN   oxyCODONE-acetaminophen 5-325 MG tablet Commonly known as:  PERCOCET/ROXICET   prochlorperazine 10 MG tablet Commonly known as:  COMPAZINE     TAKE these medications   acetaminophen 500 MG tablet Commonly known as:  TYLENOL Take 1,000 mg by mouth every 6 (six) hours as needed (for pain.).   acyclovir 400 MG tablet Commonly known as:  ZOVIRAX Take 400 mg by mouth 3 (three) times daily as needed (cold sore outbreaks).   cetaphil cream Apply 1 application topically as needed (dry skin).   docusate sodium 100 MG capsule Commonly known as:  COLACE Take 100 mg by mouth 2 (two) times daily.   levothyroxine 100 MCG tablet Commonly known as:  SYNTHROID, LEVOTHROID Take 100 mcg by mouth daily before breakfast.   lisinopril 10 MG tablet Commonly known as:  PRINIVIL,ZESTRIL Take 10 mg by mouth daily.   loratadine 10 MG tablet Commonly known as:  CLARITIN Take 10 mg by mouth as directed. Take 10 mg the day before, of, and after chemo   multivitamin with minerals Tabs tablet Take 1 tablet by mouth daily.  Discharge Care Instructions        Start     Ordered   11/28/16 0000  Activity as tolerated - No restrictions     11/28/16 1439   11/28/16 0000  Diet - low sodium heart healthy     11/28/16 1439        DISCHARGE INSTRUCTIONS:   DIET:  Cardiac diet  DISCHARGE CONDITION:  Stable  ACTIVITY:  Activity as tolerated  OXYGEN:  Home Oxygen: No.   Oxygen Delivery:  room air  DISCHARGE LOCATION:  home   If you experience worsening of your admission symptoms, develop shortness of breath, life threatening emergency, suicidal or homicidal thoughts you must seek medical attention immediately by calling 911 or calling your MD immediately  if symptoms less severe.  You Must read complete instructions/literature along with all the possible adverse reactions/side effects for all the Medicines you take and that have been prescribed to you. Take any new Medicines after you have completely understood and accpet all the possible adverse reactions/side effects.   Please note  You were cared for by a hospitalist during your hospital stay. If you have any questions about your discharge medications or the care you received while you were in the hospital after you are discharged, you can call the unit and asked to speak with the hospitalist on call if the hospitalist that took care of you is not available. Once you are discharged, your primary care physician will handle any further medical issues. Please note that NO REFILLS for any discharge medications will be authorized once you are discharged, as it is imperative that you return to your primary care physician (or establish a relationship with a primary care physician if you do not have one) for your aftercare needs so that they can reassess your need for medications and monitor your lab values.     Today   Pt. Seen w/ help of interpreter.  No abdominal pain presently.  No N/V.  Daughter at bedside. Feels better.   VITAL SIGNS:  Blood pressure (!) 128/53, pulse 75, temperature 98 F (36.7 C), temperature source Oral, resp. rate 18, height 5\' 1"  (1.549 m), weight 69.9 kg (154 lb 3.2 oz), SpO2 100 %.  I/O:   Intake/Output Summary (Last 24 hours) at 11/28/16 1440 Last data filed at 11/28/16 1355  Gross per 24 hour  Intake          1088.75 ml  Output             1100 ml  Net           -11.25 ml    PHYSICAL  EXAMINATION:  GENERAL:  61 y.o.-year-old patient lying in the bed with no acute distress.  EYES: Pupils equal, round, reactive to light and accommodation. No scleral icterus. Extraocular muscles intact.  HEENT: Head atraumatic, normocephalic. Oropharynx and nasopharynx clear.  NECK:  Supple, no jugular venous distention. No thyroid enlargement, no tenderness.  LUNGS: Normal breath sounds bilaterally, no wheezing, rales,rhonchi. No use of accessory muscles of respiration.  CARDIOVASCULAR: S1, S2 normal. No murmurs, rubs, or gallops.  ABDOMEN: Soft, non-tender, non-distended. Bowel sounds present. No organomegaly or mass.  EXTREMITIES: No pedal edema, cyanosis, or clubbing.  NEUROLOGIC: Cranial nerves II through XII are intact. No focal motor or sensory defecits b/l.  PSYCHIATRIC: The patient is alert and oriented x 3.  SKIN: No obvious rash, lesion, or ulcer.   DATA REVIEW:   CBC  Recent Labs Lab 11/28/16 0136  WBC 9.7  HGB 11.4*  HCT 33.0*  PLT 101*    Chemistries   Recent Labs Lab 11/27/16 1611 11/28/16 0136  NA 132* 134*  K 3.8 3.9  CL 100* 102  CO2 25 25  GLUCOSE 127* 159*  BUN 12 8  CREATININE 0.68 0.47  CALCIUM 8.8* 8.5*  MG  --  1.9  AST 35  --   ALT 16  --   ALKPHOS 116  --   BILITOT 0.7  --     Cardiac Enzymes No results for input(s): TROPONINI in the last 168 hours.  Microbiology Results  Results for orders placed or performed during the hospital encounter of 11/27/16  Culture, blood (routine x 2)     Status: None (Preliminary result)   Collection Time: 11/27/16  4:11 PM  Result Value Ref Range Status   Specimen Description BLOOD BLOOD LEFT HAND  Final   Special Requests   Final    BOTTLES DRAWN AEROBIC AND ANAEROBIC Blood Culture results may not be optimal due to an inadequate volume of blood received in culture bottles   Culture NO GROWTH < 24 HOURS  Final   Report Status PENDING  Incomplete  Culture, blood (routine x 2)     Status: None  (Preliminary result)   Collection Time: 11/27/16  4:32 PM  Result Value Ref Range Status   Specimen Description BLOOD BLOOD LEFT HAND  Final   Special Requests   Final    BOTTLES DRAWN AEROBIC AND ANAEROBIC Blood Culture results may not be optimal due to an inadequate volume of blood received in culture bottles   Culture NO GROWTH < 24 HOURS  Final   Report Status PENDING  Incomplete    RADIOLOGY:  Dg Chest 2 View  Result Date: 11/27/2016 CLINICAL DATA:  Pt has right side abd pain for 4 days. Pt also has a fever for days. No v/d Pt also reports back pain. Pt alert. Interpreter with pt. Hx breast cancer pt Last chemo was end of August, history of hypertension, hypothyroidism EXAM: CHEST  2 VIEW COMPARISON:  11/20/2016 FINDINGS: The cardiac silhouette is normal in size and configuration. No mediastinal or hilar masses. No evidence of adenopathy. Clear lungs.  No pleural effusion or pneumothorax. Right anterior chest wall, internal jugular Port-A-Cath is stable. Skeletal structures are intact. IMPRESSION: No active cardiopulmonary disease. Electronically Signed   By: Lajean Manes M.D.   On: 11/27/2016 17:50   Ct Abdomen Pelvis W Contrast  Result Date: 11/27/2016 CLINICAL DATA:  RIGHT upper quadrant pain for 4 days, fever, back pain. History of breast and cervical cancer. EXAM: CT ABDOMEN AND PELVIS WITH CONTRAST TECHNIQUE: Multidetector CT imaging of the abdomen and pelvis was performed using the standard protocol following bolus administration of intravenous contrast. CONTRAST:  14mL ISOVUE-300 IOPAMIDOL (ISOVUE-300) INJECTION 61% COMPARISON:  CT abdomen and pelvis October 28, 2016 abdominal ultrasound November 27, 2016 FINDINGS: LOWER CHEST dependent atelectasis. Included heart size is normal. No pericardial effusion. HEPATOBILIARY: Nodular liver contour without solid mass. subcentimeter hypodensity in dome of the liver. Stable 2.2 cm cyst in LEFT lobe of the liver. PANCREAS: Normal. SPLEEN:  Normal. ADRENALS/URINARY TRACT: Kidneys are orthotopic, demonstrating symmetric enhancement. No nephrolithiasis, or solid renal masses. Multifocal RIGHT renal scarring and mild atrophy and mild chronic hydronephrosis. Too small to characterize hypodensity lower pole RIGHT kidney. The unopacified ureters are normal in course and caliber. Delayed imaging through the kidneys demonstrates symmetric prompt contrast excretion within the proximal urinary collecting system.  Urinary bladder is well distended and unremarkable. Normal adrenal glands. STOMACH/BOWEL: The stomach, small and large bowel are normal in course and caliber without inflammatory changes. Mild colonic diverticulosis. Normal appendix. VASCULAR/LYMPHATIC: Aortoiliac vessels are normal in course and caliber. New irregular soft tissue about the distal thoracic and proximal suprarenal abdominal aorta with periaortic fat stranding. Mild calcific atherosclerosis. No lymphadenopathy by CT size criteria. Stable subcentimeter retroperitoneal lymph nodes. Status post pelvic side wall nodal dissection. REPRODUCTIVE: Status post hysterectomy. OTHER: No intraperitoneal free fluid or free air. MUSCULOSKELETAL: Nonacute. Grade 1 L4-5 anterolisthesis without spondylolysis. Anterior abdominal wall scarring. Moderate L2-3 disc osteophyte complex. IMPRESSION: 1. 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 Superior extent of involvement incompletely imaged. 2. 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 3. Mildly atrophic RIGHT kidney with mild chronic hydronephrosis. 4.  Aortic Atherosclerosis (ICD10-I70.0). Acute findings discussed with and reconfirmed by [facility] SIADECKI on 11/27/2016 at 7:23 pm. Electronically Signed   By: Elon Alas M.D.   On: 11/27/2016 19:29   US Abdomen Limited Ruq  Result Date: 11/27/2016 CLINICAL DATA:  Right upper quadrant pain. EXAM: ULTRASOUND ABDOMEN LIMITED RIGHT UPPER QUADRANT COMPARISON:  Abdominal CT  10/28/2016 FINDINGS: Gallbladder: No gallstones or wall thickening visualized. No sonographic Murphy sign noted by sonographer. Common bile duct: Diameter: 3 mm Liver: Simple 25 mm left hepatic cyst. There is heterogeneous echotexture this patient with cirrhotic changes on CT. Patent antegrade flow in the imaged hepatic and portal venous system. Moderate right hydronephrosis. A right ureteral jet was not visualized. IMPRESSION: 1. Right hydronephrosis. The bladder was scanned and a ureteral jet was never seen on the right. 2. Negative gallbladder. 3. Heterogeneous liver correlating with cirrhotic morphology on recent CT. Electronically Signed   By: Monte Fantasia M.D.   On: 11/27/2016 17:35      Management plans discussed with the patient, family and they are in agreement.  CODE STATUS:     Code Status Orders        Start     Ordered   11/28/16 0113  Full code  Continuous     11/28/16 0112   TOTAL TIME TAKING CARE OF THIS PATIENT: 40 minutes.    Henreitta Leber M.D on 11/28/2016 at 2:40 PM  Between 7am to 6pm - Pager - [phone]  After 6pm go to [email or web address] - Proofreader  [facility]  Office  [phone]  CC: Primary care physician; Denton Lank, MD

## 2016-11-28 NOTE — Care Management (Signed)
No discharge needs identified by members of the care team 

## 2016-11-28 NOTE — Progress Notes (Addendum)
[employer] [place] [phone] service  was used to translate for order, plan of care, nursing care and medical treatment

## 2016-11-28 NOTE — Consult Note (Signed)
[facility] Vascular Consult Note  MRN : [id number]  Wanda Reed is a 61 y.o. (1955-06-11) female who presents with chief complaint of  Chief Complaint  Patient presents with  . Abdominal Pain  .  History of Present Illness: I am asked to evaluate the patient by Dr. Ara Kussmaul. The patient is a 61 year old woman presented to the emergency room yesterday with the complaint of right upper quadrant abdominal pain. She notes that she has had right upper quadrant pain for a long time however over the past several days it became significantly more intense She describes it as crampy and intermittent. Today it is much improved compared to yesterday. She also notes when she pushesr right upper quadrant the pain radiates through to the back. It is associated with nausea and some fever. No vomiting.  As part of her workup in the emergency room she did undergo a CT scan of the abdomen which demonstrated a small segment of thickening of the aorta as it passes through the crus of the diaphragm.  There was a CT scan of the abdomen and pelvis obtained 14 August, 2008 and when compared to yesterday's scan this is a new finding. Therefore suspicious for aortic inflammation. Patient denies left sided back pain or left upper quadrant pain. There is no family history of vasculitis.  She does not have a history of autoimmune disease either.  Current Facility-Administered Medications  Medication Dose Route Frequency Provider Last Rate Last Dose  . 0.9 %  sodium chloride infusion   Intravenous Continuous Hugelmeyer, Alexis, DO 75 mL/hr at 11/28/16 0604    . acetaminophen (TYLENOL) tablet 1,000 mg  1,000 mg Oral Q6H PRN Hugelmeyer, Alexis, DO      . acyclovir (ZOVIRAX) tablet 400 mg  400 mg Oral TID PRN Hugelmeyer, Alexis, DO      . albuterol (PROVENTIL) (2.5 MG/3ML) 0.083% nebulizer solution 2.5 mg  2.5 mg Nebulization Q6H PRN Hugelmeyer, Alexis, DO      . bisacodyl (DULCOLAX) EC tablet 5 mg   5 mg Oral Daily PRN Hugelmeyer, Alexis, DO      . docusate sodium (COLACE) capsule 100 mg  100 mg Oral BID Hugelmeyer, Alexis, DO   100 mg at 11/28/16 1030  . enoxaparin (LOVENOX) injection 40 mg  40 mg Subcutaneous Q24H Hugelmeyer, Alexis, DO      . ipratropium (ATROVENT) nebulizer solution 0.5 mg  0.5 mg Nebulization Q6H PRN Hugelmeyer, Alexis, DO      . levothyroxine (SYNTHROID, LEVOTHROID) tablet 100 mcg  100 mcg Oral QAC breakfast Hugelmeyer, Alexis, DO   100 mcg at 11/28/16 0805  . lisinopril (PRINIVIL,ZESTRIL) tablet 10 mg  10 mg Oral Daily Hugelmeyer, Alexis, DO   10 mg at 11/28/16 1028  . loratadine (CLARITIN) tablet 10 mg  10 mg Oral UD Hugelmeyer, Alexis, DO      . magnesium citrate solution 1 Bottle  1 Bottle Oral Once PRN Hugelmeyer, Alexis, DO      . [START ON 11/29/2016] methylPREDNISolone sodium succinate (SOLU-MEDROL) 125 mg/2 mL injection 60 mg  60 mg Intravenous Q24H Henreitta Leber, MD      . multivitamin with minerals tablet 1 tablet  1 tablet Oral Daily Hugelmeyer, Alexis, DO   1 tablet at 11/28/16 1028  . nivea cream 1 application  1 application Topical PRN Hugelmeyer, Alexis, DO      . ondansetron (ZOFRAN) tablet 4 mg  4 mg Oral Q6H PRN Hugelmeyer, Alexis, DO  Or  . ondansetron (ZOFRAN) injection 4 mg  4 mg Intravenous Q6H PRN Hugelmeyer, Alexis, DO      . oxyCODONE (Oxy IR/ROXICODONE) immediate release tablet 5 mg  5 mg Oral Q4H PRN Hugelmeyer, Alexis, DO      . piperacillin-tazobactam (ZOSYN) IVPB 3.375 g  3.375 g Intravenous Q8H Hugelmeyer, Alexis, DO   Stopped at 11/28/16 1020  . senna-docusate (Senokot-S) tablet 1 tablet  1 tablet Oral QHS PRN Hugelmeyer, Alexis, DO      . sodium chloride flush (NS) 0.9 % injection 3 mL  3 mL Intravenous Q12H Hugelmeyer, Alexis, DO   3 mL at 11/28/16 0157    Past Medical History:  Diagnosis Date  . Anxiety   . Breast cancer of upper-outer quadrant of right female breast ([place]) 08/2016   1.0 cm ER+, PR-, Her 2 neu not  overexpressed. Node negative. T1b, N0.  [name] Cancer (HCC)    cervical  . Hypertension   . Hypothyroidism   . Thyroid disease     Past Surgical History:  Procedure Laterality Date  . ABDOMINAL HYSTERECTOMY    . BREAST LUMPECTOMY Right 08/28/2016  . BREAST LUMPECTOMY WITH SENTINEL LYMPH NODE BIOPSY Right 08/28/2016   Procedure: BREAST LUMPECTOMY WITH SENTINEL LYMPH NODE BX;  Surgeon: [name], MD;  Location: [facility] ORS;  Service: General;  Laterality: Right;  . BREAST MAMMOSITE Right 09/15/2016   Procedure: MAMMOSITE BREAST;  Surgeon: [name], MD;  Location: [facility] ORS;  Service: General;  Laterality: Right;  . BREAST SURGERY Right    Breast Biopsy  . CERVIX SURGERY    . COLONOSCOPY  2008  . EYE SURGERY Bilateral    Cataract Extraction with IOL  . PORTA CATH INSERTION Right 11/20/2016   Procedure: PORTA CATH INSERTION;  Surgeon: [name], MD;  Location: [facility] ORS;  Service: General;  Laterality: Right;  . SHOULDER ARTHROSCOPY Right     Social History Social History  Substance Use Topics  . Smoking status: Never Smoker  . Smokeless tobacco: Never Used  . Alcohol use No    Family History Family History  Problem Relation Age of Onset  . Breast cancer Neg Hx   No family history of bleeding/clotting disorders, porphyria or autoimmune disease   No Known Allergies   REVIEW OF SYSTEMS (Negative unless checked)  Constitutional: [] Weight loss  [] Fever  [] Chills Cardiac: [] Chest pain   [] Chest pressure   [] Palpitations   [] Shortness of breath when laying flat   [] Shortness of breath at rest   [] Shortness of breath with exertion. Vascular:  [] Pain in legs with walking   [] Pain in legs at rest   [] Pain in legs when laying flat   [] Claudication   [] Pain in feet when walking  [] Pain in feet at rest  [] Pain in feet when laying flat   [] History of DVT   [] Phlebitis   [] Swelling in legs   [] Varicose veins   [] Non-healing ulcers Pulmonary:   [] Uses home oxygen    [] Productive cough   [] Hemoptysis   [] Wheeze  [] COPD   [] Asthma Neurologic:  [] Dizziness  [] Blackouts   [] Seizures   [] History of stroke   [] History of TIA  [] Aphasia   [] Temporary blindness   [] Dysphagia   [] Weakness or numbness in arms   [] Weakness or numbness in legs Musculoskeletal:  [] Arthritis   [] Joint swelling   [] Joint pain   [] Low back pain Hematologic:  [] Easy bruising  [] Easy bleeding   [] Hypercoagulable state   [] Anemic  []   Hepatitis Gastrointestinal:  [] Blood in stool   [] Vomiting blood  [] Gastroesophageal reflux/heartburn   [] Difficulty swallowing. Genitourinary:  [] Chronic kidney disease   [] Difficult urination  [] Frequent urination  [] Burning with urination   [] Blood in urine Skin:  [] Rashes   [] Ulcers   [] Wounds Psychological:  [] History of anxiety   []  History of major depression.  Physical Examination  Vitals:   11/28/16 0000 11/28/16 0023 11/28/16 0353 11/28/16 0800  BP: 100/74 (!) 143/58 (!) 120/49 (!) 128/53  Pulse: 77 85 77 75  Resp: 14 17 17 18   Temp:  97.9 F (36.6 C) 97.7 F (36.5 C) 98 F (36.7 C)  TempSrc:  Oral Oral Oral  SpO2: 97% 99% 95% 100%  Weight:  69.9 kg (154 lb 3.2 oz)    Height:  5\' 1"  (1.549 m)     Body mass index is 29.14 kg/m. Gen:  WD/WN, NAD Head: [place]/AT, No temporalis wasting. Prominent temp pulse not noted. Ear/Nose/Throat: Hearing grossly intact, nares w/o erythema or drainage, oropharynx w/o Erythema/Exudate Eyes: Sclera non-icteric, conjunctiva clear Neck: Trachea midline.  No JVD.  Pulmonary:  Good air movement, respirations not labored, equal bilaterally.  Cardiac: RRR, normal S1, S2. Vascular:  Vessel Right Left  Radial Palpable Palpable  Ulnar Palpable Palpable  Brachial Palpable Palpable  Carotid Palpable, without bruit Palpable, without bruit  Aorta Not palpable N/A  Femoral Palpable Palpable  Popliteal Palpable Palpable  PT Palpable Palpable  DP Palpable Palpable   Gastrointestinal: soft, mild tenderness right uper  quadrent/non-distended. No guarding/reflex.  Musculoskeletal: M/S 5/5 throughout.  Extremities without ischemic changes.  No deformity or atrophy. No edema. Neurologic: Sensation grossly intact in extremities.  Symmetrical.  Speech is fluent. Motor exam as listed above. Psychiatric: Judgment intact, Mood & affect appropriate for pt's clinical situation. Dermatologic: No rashes or ulcers noted.  No cellulitis or open wounds. Lymph : No Cervical, Axillary, or Inguinal lymphadenopathy.      CBC Lab Results  Component Value Date   WBC 9.7 11/28/2016   HGB 11.4 (L) 11/28/2016   HCT 33.0 (L) 11/28/2016   MCV 92.7 11/28/2016   PLT 101 (L) 11/28/2016    BMET    Component Value Date/Time   NA 134 (L) 11/28/2016 0136   K 3.9 11/28/2016 0136   CL 102 11/28/2016 0136   CO2 25 11/28/2016 0136   GLUCOSE 159 (H) 11/28/2016 0136   BUN 8 11/28/2016 0136   CREATININE 0.47 11/28/2016 0136   CALCIUM 8.5 (L) 11/28/2016 0136   GFRNONAA >60 11/28/2016 0136   GFRAA >60 11/28/2016 0136   Estimated Creatinine Clearance: 66 mL/min (by C-G formula based on SCr of 0.47 mg/dL).  COAG No results found for: INR, PROTIME  Radiology Dg Chest 2 View  Result Date: 11/27/2016 CLINICAL DATA:  Pt has right side abd pain for 4 days. Pt also has a fever for days. No v/d Pt also reports back pain. Pt alert. Interpreter with pt. Hx breast cancer pt Last chemo was end of August, history of hypertension, hypothyroidism EXAM: CHEST  2 VIEW COMPARISON:  11/20/2016 FINDINGS: The cardiac silhouette is normal in size and configuration. No mediastinal or hilar masses. No evidence of adenopathy. Clear lungs.  No pleural effusion or pneumothorax. Right anterior chest wall, internal jugular Port-A-Cath is stable. Skeletal structures are intact. IMPRESSION: No active cardiopulmonary disease. Electronically Signed   By: Lajean Manes M.D.   On: 11/27/2016 17:50   Dg Chest 2 View  Result Date: 11/04/2016 CLINICAL DATA:  Fever. EXAM: CHEST  2 VIEW COMPARISON:  October 26, 2016 FINDINGS: The heart size and mediastinal contours are within normal limits. Both lungs are clear. The visualized skeletal structures are unremarkable. IMPRESSION: No active cardiopulmonary disease. Electronically Signed   By: Dorise Bullion III M.D   On: 11/04/2016 18:49   Ct Abdomen Pelvis W Contrast  Result Date: 11/27/2016 CLINICAL DATA:  RIGHT upper quadrant pain for 4 days, fever, back pain. History of breast and cervical cancer. EXAM: CT ABDOMEN AND PELVIS WITH CONTRAST TECHNIQUE: Multidetector CT imaging of the abdomen and pelvis was performed using the standard protocol following bolus administration of intravenous contrast. CONTRAST:  113mL ISOVUE-300 IOPAMIDOL (ISOVUE-300) INJECTION 61% COMPARISON:  CT abdomen and pelvis October 28, 2016 abdominal ultrasound November 27, 2016 FINDINGS: LOWER CHEST dependent atelectasis. Included heart size is normal. No pericardial effusion. HEPATOBILIARY: Nodular liver contour without solid mass. subcentimeter hypodensity in dome of the liver. Stable 2.2 cm cyst in LEFT lobe of the liver. PANCREAS: Normal. SPLEEN: Normal. ADRENALS/URINARY TRACT: Kidneys are orthotopic, demonstrating symmetric enhancement. No nephrolithiasis, or solid renal masses. Multifocal RIGHT renal scarring and mild atrophy and mild chronic hydronephrosis. Too small to characterize hypodensity lower pole RIGHT kidney. The unopacified ureters are normal in course and caliber. Delayed imaging through the kidneys demonstrates symmetric prompt contrast excretion within the proximal urinary collecting system. Urinary bladder is well distended and unremarkable. Normal adrenal glands. STOMACH/BOWEL: The stomach, small and large bowel are normal in course and caliber without inflammatory changes. Mild colonic diverticulosis. Normal appendix. VASCULAR/LYMPHATIC: Aortoiliac vessels are normal in course and caliber. New irregular soft tissue about the  distal thoracic and proximal suprarenal abdominal aorta with periaortic fat stranding. Mild calcific atherosclerosis. No lymphadenopathy by CT size criteria. Stable subcentimeter retroperitoneal lymph nodes. Status post pelvic side wall nodal dissection. REPRODUCTIVE: Status post hysterectomy. OTHER: No intraperitoneal free fluid or free air. MUSCULOSKELETAL: Nonacute. Grade 1 L4-5 anterolisthesis without spondylolysis. Anterior abdominal wall scarring. Moderate L2-3 disc osteophyte complex. IMPRESSION: 1. 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 Superior extent of involvement incompletely imaged. 2. 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 3. Mildly atrophic RIGHT kidney with mild chronic hydronephrosis. 4.  Aortic Atherosclerosis (ICD10-I70.0). Acute findings discussed with and reconfirmed by [facility] on 11/27/2016 at 7:23 pm. Electronically Signed   By: Elon Alas M.D.   On: 11/27/2016 19:29   Dg Chest Port 1 View  Result Date: 11/20/2016 CLINICAL DATA:  Status post Port-A-Cath placement. EXAM: PORTABLE CHEST 1 VIEW COMPARISON:  Radiographs of November 04, 2016. FINDINGS: Stable cardiomediastinal silhouette. No pneumothorax or pleural effusion is noted. Interval placement of right internal jugular Port-A-Cath with distal tip in expected position of cavoatrial junction. Both lungs are clear. The visualized skeletal structures are unremarkable. IMPRESSION: Interval placement of right internal jugular Port-A-Cath with distal tip in expected position of cavoatrial junction. No acute cardiopulmonary abnormality seen. Electronically Signed   By: Marijo Conception, M.D.   On: 11/20/2016 09:04   Dg C-arm 1-60 Min-no Report  Result Date: 11/20/2016 Fluoroscopy was utilized by the requesting physician.  No radiographic interpretation.   US Abdomen Limited Ruq  Result Date: 11/27/2016 CLINICAL DATA:  Right upper quadrant pain. EXAM: ULTRASOUND ABDOMEN LIMITED RIGHT UPPER  QUADRANT COMPARISON:  Abdominal CT 10/28/2016 FINDINGS: Gallbladder: No gallstones or wall thickening visualized. No sonographic Murphy sign noted by sonographer. Common bile duct: Diameter: 3 mm Liver: Simple 25 mm left hepatic cyst. There is heterogeneous echotexture this patient with cirrhotic changes on CT.  Patent antegrade flow in the imaged hepatic and portal venous system. Moderate right hydronephrosis. A right ureteral jet was not visualized. IMPRESSION: 1. Right hydronephrosis. The bladder was scanned and a ureteral jet was never seen on the right. 2. Negative gallbladder. 3. Heterogeneous liver correlating with cirrhotic morphology on recent CT. Electronically Signed   By: Monte Fantasia M.D.   On: 11/27/2016 17:35      Assessment/Plan 1. Inflammatory changes of the suprarenal aorta: I do not believe that this is responsible for her abdominal pain or any of her other symptoms. At the present time no surgical intervention regarding her aortic inflammation is indicated.  I would consider a course of steroids. Consideration for rheumatology evaluation and an autoimmune workup as well. At this point in time I would plan to rescan her in approximately 3 months. 2. Breast cancer: Continue chemotherapy per Dr. Grayland Ormond 3. Right upper quadrant pain: I have discussed this with Dr. Bary Castilla and will ask him to see her regarding possible acalculous cholecystitis 4.  Hypertension: Continue her home antihypertensives. 5.  Hypothyroidism:  Continue Synthroid   Hortencia Pilar, MD  11/28/2016 11:36 AM    This note was created with Dragon medical transcription system.  Any error is purely unintentional

## 2016-11-28 NOTE — ED Notes (Signed)
Pt assisted to restroom. Steady gait.

## 2016-11-28 NOTE — Progress Notes (Signed)
Pharmacy Antibiotic Note  Wanda Reed is a 61 y.o. female admitted on 11/27/2016 with abdominal pain which was found to be acute aortitis vs. vascular disease on CT abdomen.  Pharmacy has been consulted for vanc/zosyn dosing.  Plan: Patient received vanc 1g IV and zosyn 3.375g IV x 1 in ED  Will f/u vanc 1g IV q12h w/ 6 hour stack dose. Will draw a vanc trough 9/15 @ 1200 prior to 4th dose. Will continue zosyn 3.375g IV q8h w/ extended infusion.  Ke 0.0592 T1/2 12 hrs Goal trough 15 - 20 mcg/mL.  This may just be an autoimmune/vasculitis reaction resulting in vitals that may mimic acute infection, pt. Had a white count of 30 on 9/6, but currently WBC is 10.0. Correlating clinically patient may not need abx.  Height: 5\' 1"  (154.9 cm) Weight: 154 lb 3.2 oz (69.9 kg) IBW/kg (Calculated) : 47.8  Temp (24hrs), Avg:99.5 F (37.5 C), Min:97.9 F (36.6 C), Max:101.9 F (38.8 C)   Recent Labs Lab 11/27/16 1611  WBC 10.0  CREATININE 0.68    Estimated Creatinine Clearance: 66 mL/min (by C-G formula based on SCr of 0.68 mg/dL).    No Known Allergies  Thank you for allowing pharmacy to be a part of this patient's care.  Tobie Lords, PharmD, BCPS Clinical Pharmacist 11/28/2016

## 2016-11-28 NOTE — Progress Notes (Signed)
Initial Nutrition Assessment  DOCUMENTATION CODES:   Not applicable  INTERVENTION:  1. Ensure Enlive po BID, each supplement provides 350 kcal and 20 grams of protein  2. Discussed with patient and daughter for 15 minutes means by which to help patient with thick saliva/secretions she says she develops. Seems to cause patient to not want to eat at home. She has previously tried baking soda which she says causes pain and irritation to her mouth. Patient may be dehydrated at home causing these thick secretions.  Discussed with RN as well, she can try lemon juice in water, or warm salter water. Despite this, she does not complain of choking or gagging on them, mostly just spitting them out a lot.  3. Continue MVI w/ Minerals  NUTRITION DIAGNOSIS:   Inadequate oral intake related to cancer and cancer related treatments as evidenced by percent weight loss.  GOAL:   Patient will meet greater than or equal to 90% of their needs  MONITOR:   PO intake, I & O's, Labs, Supplement acceptance, Weight trends  REASON FOR ASSESSMENT:   Malnutrition Screening Tool    ASSESSMENT:   This is a 61 y.o. female with a history of breast cancer s/p lumpectomy in June, on chemotherapy august 29th with subclavian port placed Sept 6th, hypertension, hypothyroidism now being admitted with RUQ pain and inflammatory changes of the suprarenal aorta  Spoke with patient's daughter at bedside who speaks english. Patient seems to be eating well, 3 times per day, small meals along with 1 ensure per day. Complains of some nausea occasionally, a 2-3 pound weight loss but otherwise patient has been doing well. Receiving Zofran for nausea at home. [name] Recently began cycle 2 of 4 of Taxotere and Cytoxan 8/29.   Ate Oatmeal, fruit cup and raisin bran for breakfast. Tilapia, whipped potatoes, broccoli, dinner roll, fruit and chicken broth for lunch Appetite seems good.  Discharging today.  Meal Completion:  100%  Labs reviewed:  Na 134  Medications reviewed and include:  Colace, Solumedrol NS at 42mL/hr   Intake/Output Summary (Last 24 hours) at 11/28/16 1446 Last data filed at 11/28/16 1355  Gross per 24 hour  Intake          1088.75 ml  Output             1100 ml  Net           -11.25 ml    Diet Order:  Diet Heart Room service appropriate? Yes; Fluid consistency: Thin Diet - low sodium heart healthy  Skin:  Wound (see comment) (Closed incision to R chest, L and R Neck)  Last BM:  11/27/2016  Height:   Ht Readings from Last 1 Encounters:  11/28/16 5\' 1"  (1.549 m)    Weight:   Wt Readings from Last 1 Encounters:  11/28/16 154 lb 3.2 oz (69.9 kg)    Ideal Body Weight:  47.72 kg  BMI:  Body mass index is 29.14 kg/m.  Estimated Nutritional Needs:   Kcal:  2458-0998 calories (30-35 cal/kg)  Protein:  98-112 grams (1.4-1.6g/kg)  Fluid:  1.6-1.9L  EDUCATION NEEDS:   Education needs addressed  Satira Anis. Gaye Scorza, MS, RD LDN Inpatient Clinical Dietitian Pager [phone]

## 2016-11-28 NOTE — Evaluation (Signed)
Physical Therapy Evaluation Patient Details Name: Wanda Reed MRN: [id number] DOB: Jul 05, 1955 Today's Date: 11/28/2016   History of Present Illness  61 y.o. female with a known history of breast cancer on chemotherapy with her last treatment being on August 29 as well as a recent left subclavian port placed on September 6, hypertension, hypothyroidism and anxiety presents with abdominal pain.  Patient was in a usual state of health until about 3-4 days ago when she describes the onset of right upper quadrant abdominal pain described as crampy, intermittent and associated with nausea and fever.  Clinical Impression  Pt did well with PT showing no hesitation with mobility, good balance and though she had some subjective fatigue she was easily able to ambulate 250 ft and negotiate steps with O2 sats 98-100%.  Pt has assist from 2 daughters (one lives with her, one comes by every day) and should be fine to return home w/o issue once medically cleared.  No further PT needs at this time.     Follow Up Recommendations No PT follow up    Equipment Recommendations       Recommendations for Other Services       Precautions / Restrictions Restrictions Weight Bearing Restrictions: No      Mobility  Bed Mobility Overal bed mobility: Independent             General bed mobility comments: Pt easily and confidently gets herself to sitting EOB  Transfers Overall transfer level: Independent Equipment used: None             General transfer comment: Pt was able to rise to standing w/o assist, no LOBs/safety issues, good confidence  Ambulation/Gait Ambulation/Gait assistance: Modified independent (Device/Increase time) Ambulation Distance (Feet): 250 Feet Assistive device: None       General Gait Details: Pt walks with consistent, community appropriate cadence.  She had no LOBs and though she c/o some minor fatigue her O2 remains in the high 90s  Stairs Stairs: Yes Stairs  assistance: Supervision Stair Management: No rails Number of Stairs: 4 General stair comments: Pt confidently negotiated up/down steps w/o assist  Wheelchair Mobility    Modified Rankin (Stroke Patients Only)       Balance Overall balance assessment: Independent                                           Pertinent Vitals/Pain Pain Assessment: 0-10 Pain Score: 2  Pain Location: minimal R upper quadrant pain    Home Living Family/patient expects to be discharged to:: Private residence Living Arrangements: Children Available Help at Discharge: Family   Home Access: Stairs to enter   Technical brewer of Steps: 3          Prior Function Level of Independence: Independent         Comments: Pt generally not overly active, especially with chemo treatments, but is able to dress, shower, etc w/o assist - family helps a lot with IADLs     Hand Dominance        Extremity/Trunk Assessment   Upper Extremity Assessment Upper Extremity Assessment: Overall WFL for tasks assessed    Lower Extremity Assessment Lower Extremity Assessment: Overall WFL for tasks assessed       Communication   Communication: Prefers language other than English  Cognition Arousal/Alertness: Awake/alert Behavior During Therapy: WFL for tasks assessed/performed Overall Cognitive Status: Within  Functional Limits for tasks assessed                                        General Comments      Exercises     Assessment/Plan    PT Assessment Patent does not need any further PT services  PT Problem List         PT Treatment Interventions      PT Goals (Current goals can be found in the Care Plan section)  Acute Rehab PT Goals Patient Stated Goal: go home PT Goal Formulation: All assessment and education complete, DC therapy    Frequency     Barriers to discharge        Co-evaluation               AM-PAC PT "6 Clicks" Daily  Activity  Outcome Measure Difficulty turning over in bed (including adjusting bedclothes, sheets and blankets)?: None Difficulty moving from lying on back to sitting on the side of the bed? : None Difficulty sitting down on and standing up from a chair with arms (e.g., wheelchair, bedside commode, etc,.)?: None Help needed moving to and from a bed to chair (including a wheelchair)?: None Help needed walking in hospital room?: None Help needed climbing 3-5 steps with a railing? : None 6 Click Score: 24    End of Session Equipment Utilized During Treatment: Gait belt Activity Tolerance: Patient tolerated treatment well Patient left: in chair;with call bell/phone within reach;with family/visitor present;with nursing/sitter in room Nurse Communication: Mobility status PT Visit Diagnosis: Muscle weakness (generalized) (M62.81)    Time: 4944-9675 PT Time Calculation (min) (ACUTE ONLY): 14 min   Charges:   PT Evaluation $PT Eval Low Complexity: 1 Low     PT G CodesKreg Shropshire, DPT 11/28/2016, 10:53 AM

## 2016-11-29 LAB — COMPLEMENT, TOTAL: Compl, Total (CH50): >60 U/mL (ref >41)

## 2016-11-29 LAB — C4 COMPLEMENT: Complement C4, Body Fluid: 14 mg/dL (ref 14–44)

## 2016-11-29 LAB — C3 COMPLEMENT: C3 Complement: 151 mg/dL (ref 82–167)

## 2016-11-29 LAB — HIV ANTIBODY (ROUTINE TESTING W REFLEX): HIV Screen 4th Generation wRfx: NONREACTIVE

## 2016-12-01 ENCOUNTER — Inpatient Hospital Stay: Payer: Medicaid Other | Attending: Oncology

## 2016-12-01 DIAGNOSIS — N1330 Unspecified hydronephrosis: Secondary | ICD-10-CM | POA: Insufficient documentation

## 2016-12-01 DIAGNOSIS — I10 Essential (primary) hypertension: Secondary | ICD-10-CM | POA: Insufficient documentation

## 2016-12-01 DIAGNOSIS — M898X0 Other specified disorders of bone, multiple sites: Secondary | ICD-10-CM | POA: Insufficient documentation

## 2016-12-01 DIAGNOSIS — D649 Anemia, unspecified: Secondary | ICD-10-CM | POA: Insufficient documentation

## 2016-12-01 DIAGNOSIS — M549 Dorsalgia, unspecified: Secondary | ICD-10-CM | POA: Insufficient documentation

## 2016-12-01 DIAGNOSIS — E039 Hypothyroidism, unspecified: Secondary | ICD-10-CM | POA: Insufficient documentation

## 2016-12-01 DIAGNOSIS — M4316 Spondylolisthesis, lumbar region: Secondary | ICD-10-CM | POA: Insufficient documentation

## 2016-12-01 DIAGNOSIS — Z170 Estrogen receptor positive status [ER+]: Secondary | ICD-10-CM | POA: Insufficient documentation

## 2016-12-01 DIAGNOSIS — R5383 Other fatigue: Secondary | ICD-10-CM | POA: Insufficient documentation

## 2016-12-01 DIAGNOSIS — I776 Arteritis, unspecified: Secondary | ICD-10-CM | POA: Insufficient documentation

## 2016-12-01 DIAGNOSIS — E871 Hypo-osmolality and hyponatremia: Secondary | ICD-10-CM | POA: Insufficient documentation

## 2016-12-01 DIAGNOSIS — F419 Anxiety disorder, unspecified: Secondary | ICD-10-CM | POA: Insufficient documentation

## 2016-12-01 DIAGNOSIS — K7460 Unspecified cirrhosis of liver: Secondary | ICD-10-CM | POA: Insufficient documentation

## 2016-12-01 DIAGNOSIS — R634 Abnormal weight loss: Secondary | ICD-10-CM | POA: Insufficient documentation

## 2016-12-01 DIAGNOSIS — Z79899 Other long term (current) drug therapy: Secondary | ICD-10-CM | POA: Insufficient documentation

## 2016-12-01 DIAGNOSIS — R509 Fever, unspecified: Secondary | ICD-10-CM | POA: Insufficient documentation

## 2016-12-01 DIAGNOSIS — C50411 Malignant neoplasm of upper-outer quadrant of right female breast: Secondary | ICD-10-CM | POA: Insufficient documentation

## 2016-12-01 DIAGNOSIS — R531 Weakness: Secondary | ICD-10-CM | POA: Insufficient documentation

## 2016-12-01 DIAGNOSIS — Z5111 Encounter for antineoplastic chemotherapy: Secondary | ICD-10-CM | POA: Insufficient documentation

## 2016-12-01 DIAGNOSIS — R110 Nausea: Secondary | ICD-10-CM | POA: Insufficient documentation

## 2016-12-01 DIAGNOSIS — R197 Diarrhea, unspecified: Secondary | ICD-10-CM | POA: Insufficient documentation

## 2016-12-01 LAB — ANTINUCLEAR ANTIBODIES, IFA: ANA Ab, IFA: NEGATIVE

## 2016-12-01 NOTE — Progress Notes (Signed)
Nutrition  Patient identified on Malnutrition Screening Tool for weight loss.  Patient was scheduled for nutrition appointment today but did not show up.    Doss Cybulski B. Zenia Resides, [place], [place] Registered Dietitian [phone] (pager)

## 2016-12-02 LAB — CULTURE, BLOOD (ROUTINE X 2)
Culture: NO GROWTH
Culture: NO GROWTH

## 2016-12-02 NOTE — Progress Notes (Signed)
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 High risk MammaPrint.  INTERVAL HISTORY: Patient returns to clinic today for further evaluation and consideration of cycle 3 of Taxotere and Cytoxan. She was recently evaluated in the emergency room for severe abdominal pain and found to have aoritis. Symptoms have now resolved and she feels back to her baseline. She continues to have weakness and fatigue. She continues to be anxious. She has no neurologic complaints. She has a good appetite and denies weight loss. She has no chest pain or shortness of breath. She denies any nausea, vomiting, constipation, or diarrhea. She has no urinary complaints. Patient offers no further specific complaints today.  REVIEW OF SYSTEMS:   Review of Systems  Constitutional: Positive for malaise/fatigue and weight loss. Negative for fever.  Respiratory: Negative.  Negative for cough and shortness of breath.   Cardiovascular: Negative.  Negative for chest pain and leg swelling.  Gastrointestinal: Negative.  Negative for abdominal pain, nausea and vomiting.  Genitourinary: Negative.   Musculoskeletal: Negative.   Skin: Negative.  Negative for rash.  Neurological: Positive for weakness.  Psychiatric/Behavioral: The patient is nervous/anxious.     As per HPI. Otherwise, a complete review of systems is negative.  PAST MEDICAL HISTORY: Past Medical History:  Diagnosis Date  . Anxiety   . Breast cancer of upper-outer quadrant of right female breast ([place]) 08/2016   1.0 cm ER+, PR-, Her 2  neu not overexpressed. Node negative. T1b, N0.  [name] Cancer ([facility])    cervical  . Hypertension   . Hypothyroidism   . Thyroid disease     PAST SURGICAL HISTORY: Past Surgical History:  Procedure Laterality Date  . ABDOMINAL HYSTERECTOMY    . BREAST LUMPECTOMY Right 08/28/2016  . BREAST LUMPECTOMY WITH SENTINEL LYMPH NODE BIOPSY Right 08/28/2016   Procedure: BREAST LUMPECTOMY WITH SENTINEL LYMPH NODE BX;  Surgeon: [name], MD;  Location: [facility] ORS;  Service: General;  Laterality: Right;  . BREAST MAMMOSITE Right 09/15/2016   Procedure: MAMMOSITE BREAST;  Surgeon: [name], MD;  Location: [facility] ORS;  Service: General;  Laterality: Right;  . BREAST SURGERY Right    Breast Biopsy  . CERVIX SURGERY    . COLONOSCOPY  2008  . EYE SURGERY Bilateral    Cataract Extraction with IOL  . PORTA CATH INSERTION Right 11/20/2016   Procedure: PORTA CATH INSERTION;  Surgeon: [name], MD;  Location: [facility] ORS;  Service: General;  Laterality: Right;  . SHOULDER ARTHROSCOPY Right     FAMILY HISTORY: Family History  Problem Relation Age of Onset  . Breast cancer Neg Hx     ADVANCED DIRECTIVES (Y/N):  N  HEALTH MAINTENANCE: Social History  Substance Use Topics  . Smoking status: Never Smoker  . Smokeless tobacco: Never Used  . Alcohol use No     Colonoscopy:  PAP:  Bone density:  Lipid panel:  No Known Allergies  Current Outpatient Prescriptions  Medication Sig Dispense Refill  . acetaminophen (TYLENOL) 500 MG tablet Take 1,000 mg by mouth every 6 (six) hours as needed (for pain.).    [name]  acyclovir (ZOVIRAX) 400 MG tablet Take 400 mg by mouth 3 (three) times daily as needed (cold sore outbreaks).     . cetaphil (CETAPHIL) cream Apply 1 application topically as needed (dry skin).    [name] docusate sodium (COLACE) 100 MG capsule Take 100 mg by mouth 2 (two) times daily.    [name] levothyroxine (SYNTHROID, LEVOTHROID) 100 MCG tablet Take 100 mcg by mouth daily before breakfast.     . lisinopril (PRINIVIL,ZESTRIL) 10 MG tablet Take 10 mg by mouth daily.    [name] loratadine (CLARITIN) 10 MG tablet Take 10 mg by mouth as directed. Take 10 mg the day before, of, and after chemo    . Multiple Vitamin (MULTIVITAMIN WITH MINERALS) TABS tablet Take 1 tablet by mouth daily.     No current facility-administered medications for this visit.     OBJECTIVE: Vitals:   12/03/16 1232  BP: 118/70  Pulse: 90  Resp: 20  Temp: 97.9 F (36.6 C)     Body mass index is 29.27 kg/m.    ECOG FS:0 - Asymptomatic  General: Well-developed, well-nourished, no acute distress. Eyes: Pink conjunctiva, anicteric sclera. Breasts: Exam deferred today. Lungs: Clear to auscultation bilaterally. Heart: Regular rate and rhythm. No rubs, murmurs, or gallops. Abdomen: Soft, nontender, nondistended. No organomegaly noted, normoactive bowel sounds. Musculoskeletal: No edema, cyanosis, or clubbing. Neuro: Alert, answering all questions appropriately. Cranial nerves grossly intact. Skin: No rashes or petechiae noted. Psych: Normal affect.  LAB RESULTS:  Lab Results  Component Value Date   NA 135 12/03/2016   K 4.0 12/03/2016   CL 103 12/03/2016   CO2 25 12/03/2016   GLUCOSE 162 (H) 12/03/2016   BUN 12 12/03/2016   CREATININE 0.56 12/03/2016   CALCIUM 9.0 12/03/2016   PROT 7.1 12/03/2016   ALBUMIN 3.1 (L) 12/03/2016   AST 34 12/03/2016   ALT 19 12/03/2016   ALKPHOS 100 12/03/2016   BILITOT 0.4 12/03/2016   GFRNONAA >60 12/03/2016   GFRAA >60 12/03/2016    Lab Results  Component Value Date   WBC 4.4 12/03/2016   NEUTROABS 3.0 12/03/2016   HGB 11.3 (L) 12/03/2016   HCT 32.6 (L) 12/03/2016   MCV 94.1 12/03/2016   PLT 277 12/03/2016     STUDIES: Dg Chest 2 View  Result Date: 11/27/2016 CLINICAL DATA:  Pt has right side abd pain for 4 days. Pt also has a fever for days. No v/d Pt also reports back pain. Pt alert. Interpreter with pt. Hx breast cancer pt Last chemo was end of  August, history of hypertension, hypothyroidism EXAM: CHEST  2 VIEW COMPARISON:  11/20/2016 FINDINGS: The cardiac silhouette is normal in size and configuration. No mediastinal or hilar masses. No evidence of adenopathy. Clear lungs.  No pleural effusion or pneumothorax. Right anterior chest wall, internal jugular Port-A-Cath is stable. Skeletal structures are intact. IMPRESSION: No active cardiopulmonary disease. Electronically Signed   By: Lajean Manes M.D.   On: 11/27/2016 17:50   Ct Abdomen Pelvis W Contrast  Result Date: 11/27/2016 CLINICAL DATA:  RIGHT upper quadrant pain for 4 days, fever, back pain. History of breast and cervical cancer. EXAM: CT ABDOMEN AND PELVIS WITH CONTRAST TECHNIQUE: Multidetector CT imaging of the abdomen and pelvis was performed using the standard protocol following bolus administration of intravenous contrast. CONTRAST:  132m ISOVUE-300 IOPAMIDOL (ISOVUE-300) INJECTION 61% COMPARISON:  CT abdomen and pelvis October 28, 2016 abdominal ultrasound November 27, 2016 FINDINGS: LOWER CHEST dependent atelectasis. Included heart size is normal. No  pericardial effusion. HEPATOBILIARY: Nodular liver contour without solid mass. subcentimeter hypodensity in dome of the liver. Stable 2.2 cm cyst in LEFT lobe of the liver. PANCREAS: Normal. SPLEEN: Normal. ADRENALS/URINARY TRACT: Kidneys are orthotopic, demonstrating symmetric enhancement. No nephrolithiasis, or solid renal masses. Multifocal RIGHT renal scarring and mild atrophy and mild chronic hydronephrosis. Too small to characterize hypodensity lower pole RIGHT kidney. The unopacified ureters are normal in course and caliber. Delayed imaging through the kidneys demonstrates symmetric prompt contrast excretion within the proximal urinary collecting system. Urinary bladder is well distended and unremarkable. Normal adrenal glands. STOMACH/BOWEL: The stomach, small and large bowel are normal in course and caliber without inflammatory  changes. Mild colonic diverticulosis. Normal appendix. VASCULAR/LYMPHATIC: Aortoiliac vessels are normal in course and caliber. New irregular soft tissue about the distal thoracic and proximal suprarenal abdominal aorta with periaortic fat stranding. Mild calcific atherosclerosis. No lymphadenopathy by CT size criteria. Stable subcentimeter retroperitoneal lymph nodes. Status post pelvic side wall nodal dissection. REPRODUCTIVE: Status post hysterectomy. OTHER: No intraperitoneal free fluid or free air. MUSCULOSKELETAL: Nonacute. Grade 1 L4-5 anterolisthesis without spondylolysis. Anterior abdominal wall scarring. Moderate L2-3 disc osteophyte complex. IMPRESSION: 1. 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 Superior extent of involvement incompletely imaged. 2. 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 3. Mildly atrophic RIGHT kidney with mild chronic hydronephrosis. 4.  Aortic Atherosclerosis (ICD10-I70.0). Acute findings discussed with and reconfirmed by [facility] on 11/27/2016 at 7:23 pm. Electronically Signed   By: Elon Alas M.D.   On: 11/27/2016 19:29   Dg Chest Port 1 View  Result Date: 11/20/2016 CLINICAL DATA:  Status post Port-A-Cath placement. EXAM: PORTABLE CHEST 1 VIEW COMPARISON:  Radiographs of November 04, 2016. FINDINGS: Stable cardiomediastinal silhouette. No pneumothorax or pleural effusion is noted. Interval placement of right internal jugular Port-A-Cath with distal tip in expected position of cavoatrial junction. Both lungs are clear. The visualized skeletal structures are unremarkable. IMPRESSION: Interval placement of right internal jugular Port-A-Cath with distal tip in expected position of cavoatrial junction. No acute cardiopulmonary abnormality seen. Electronically Signed   By: Marijo Conception, M.D.   On: 11/20/2016 09:04   Dg C-arm 1-60 Min-no Report  Result Date: 11/20/2016 Fluoroscopy was utilized by the requesting physician.  No  radiographic interpretation.   US Abdomen Limited Ruq  Result Date: 11/27/2016 CLINICAL DATA:  Right upper quadrant pain. EXAM: ULTRASOUND ABDOMEN LIMITED RIGHT UPPER QUADRANT COMPARISON:  Abdominal CT 10/28/2016 FINDINGS: Gallbladder: No gallstones or wall thickening visualized. No sonographic Murphy sign noted by sonographer. Common bile duct: Diameter: 3 mm Liver: Simple 25 mm left hepatic cyst. There is heterogeneous echotexture this patient with cirrhotic changes on CT. Patent antegrade flow in the imaged hepatic and portal venous system. Moderate right hydronephrosis. A right ureteral jet was not visualized. IMPRESSION: 1. Right hydronephrosis. The bladder was scanned and a ureteral jet was never seen on the right. 2. Negative gallbladder. 3. Heterogeneous liver correlating with cirrhotic morphology on recent CT. Electronically Signed   By: Monte Fantasia M.D.   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 High risk MammaPrint.  PLAN:    1. 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 High risk MammaPrint. Patient had lumpectomy on August 28, 2016 followed by MammoSite radiation therapy. Given her high risk MammaPrint, have recommended adjuvant chemotherapy. Proceed with cycle 3  of 4 of Taxotere and Cytoxan. She will require Neulasta support. Patient now has a port.  At the conclusion of all her treatments, patient will benefit from an aromatase inhibitor given the ER status of her tumor. Return in 3 weeks for consideration of cycle 4.  2. Thrombocytopenia: Resolved. 3. Aoritis: Patient was given a referral to rheumatology.  Approximately 30 minutes was spent in discussion of which greater than 50% was consultation.  Interpreter was present throughout the entire clinic visit.  Patient expressed understanding  and was in agreement with this plan. She also understands that She can call clinic at any time with any questions, concerns, or complaints.   Cancer Staging Malignant neoplasm of upper-outer quadrant of right breast in female, estrogen receptor positive ([place]) Staging form: Breast, AJCC 8th Edition - Pathologic stage from 10/01/2016: Stage IA (pT1b, pN0, cM0, G2, ER: Positive, PR: Negative, HER2: Negative) - Signed by Lloyd Huger, MD on 10/01/2016   Lloyd Huger, MD   12/05/2016 11:43 PM

## 2016-12-03 ENCOUNTER — Inpatient Hospital Stay: Payer: Medicaid Other

## 2016-12-03 ENCOUNTER — Inpatient Hospital Stay: Payer: Medicaid Other | Admitting: *Deleted

## 2016-12-03 ENCOUNTER — Inpatient Hospital Stay (HOSPITAL_BASED_OUTPATIENT_CLINIC_OR_DEPARTMENT_OTHER): Payer: Medicaid Other | Admitting: Oncology

## 2016-12-03 VITALS — BP 118/70 | HR 90 | Temp 97.9°F | Resp 20 | Wt 154.9 lb

## 2016-12-03 DIAGNOSIS — R509 Fever, unspecified: Secondary | ICD-10-CM | POA: Diagnosis not present

## 2016-12-03 DIAGNOSIS — R110 Nausea: Secondary | ICD-10-CM | POA: Diagnosis not present

## 2016-12-03 DIAGNOSIS — E039 Hypothyroidism, unspecified: Secondary | ICD-10-CM

## 2016-12-03 DIAGNOSIS — M549 Dorsalgia, unspecified: Secondary | ICD-10-CM

## 2016-12-03 DIAGNOSIS — Z79899 Other long term (current) drug therapy: Secondary | ICD-10-CM | POA: Diagnosis not present

## 2016-12-03 DIAGNOSIS — R634 Abnormal weight loss: Secondary | ICD-10-CM

## 2016-12-03 DIAGNOSIS — K7460 Unspecified cirrhosis of liver: Secondary | ICD-10-CM

## 2016-12-03 DIAGNOSIS — Z170 Estrogen receptor positive status [ER+]: Secondary | ICD-10-CM

## 2016-12-03 DIAGNOSIS — C50411 Malignant neoplasm of upper-outer quadrant of right female breast: Secondary | ICD-10-CM

## 2016-12-03 DIAGNOSIS — R531 Weakness: Secondary | ICD-10-CM | POA: Diagnosis not present

## 2016-12-03 DIAGNOSIS — N1330 Unspecified hydronephrosis: Secondary | ICD-10-CM | POA: Diagnosis not present

## 2016-12-03 DIAGNOSIS — F419 Anxiety disorder, unspecified: Secondary | ICD-10-CM

## 2016-12-03 DIAGNOSIS — I10 Essential (primary) hypertension: Secondary | ICD-10-CM

## 2016-12-03 DIAGNOSIS — R197 Diarrhea, unspecified: Secondary | ICD-10-CM | POA: Diagnosis not present

## 2016-12-03 DIAGNOSIS — R700 Elevated erythrocyte sedimentation rate: Secondary | ICD-10-CM

## 2016-12-03 DIAGNOSIS — M4316 Spondylolisthesis, lumbar region: Secondary | ICD-10-CM | POA: Diagnosis not present

## 2016-12-03 DIAGNOSIS — I776 Arteritis, unspecified: Secondary | ICD-10-CM

## 2016-12-03 DIAGNOSIS — R5383 Other fatigue: Secondary | ICD-10-CM

## 2016-12-03 DIAGNOSIS — E871 Hypo-osmolality and hyponatremia: Secondary | ICD-10-CM | POA: Diagnosis not present

## 2016-12-03 DIAGNOSIS — Z5111 Encounter for antineoplastic chemotherapy: Secondary | ICD-10-CM | POA: Diagnosis not present

## 2016-12-03 DIAGNOSIS — M898X0 Other specified disorders of bone, multiple sites: Secondary | ICD-10-CM | POA: Diagnosis not present

## 2016-12-03 DIAGNOSIS — D649 Anemia, unspecified: Secondary | ICD-10-CM | POA: Diagnosis not present

## 2016-12-03 LAB — CBC WITH DIFFERENTIAL/PLATELET
Basophils Absolute: 0 10*3/uL (ref 0–0.1)
Basophils Relative: 1 %
Eosinophils Absolute: 0 10*3/uL (ref 0–0.7)
Eosinophils Relative: 1 %
HCT: 32.6 % — ABNORMAL LOW (ref 35.0–47.0)
Hemoglobin: 11.3 g/dL — ABNORMAL LOW (ref 12.0–16.0)
Lymphocytes Relative: 17 %
Lymphs Abs: 0.8 10*3/uL — ABNORMAL LOW (ref 1.0–3.6)
MCH: 32.6 pg (ref 26.0–34.0)
MCHC: 34.7 g/dL (ref 32.0–36.0)
MCV: 94.1 fL (ref 80.0–100.0)
Monocytes Absolute: 0.6 10*3/uL (ref 0.2–0.9)
Monocytes Relative: 13 %
Neutro Abs: 3 10*3/uL (ref 1.4–6.5)
Neutrophils Relative %: 68 %
Platelets: 277 10*3/uL (ref 150–440)
RBC: 3.47 MIL/uL — ABNORMAL LOW (ref 3.80–5.20)
RDW: 13.8 % (ref 11.5–14.5)
WBC: 4.4 10*3/uL (ref 3.6–11.0)

## 2016-12-03 LAB — COMPREHENSIVE METABOLIC PANEL
ALT: 19 U/L (ref 14–54)
AST: 34 U/L (ref 15–41)
Albumin: 3.1 g/dL — ABNORMAL LOW (ref 3.5–5.0)
Alkaline Phosphatase: 100 U/L (ref 38–126)
Anion gap: 7 (ref 5–15)
BUN: 12 mg/dL (ref 6–20)
CO2: 25 mmol/L (ref 22–32)
Calcium: 9 mg/dL (ref 8.9–10.3)
Chloride: 103 mmol/L (ref 101–111)
Creatinine, Ser: 0.56 mg/dL (ref 0.44–1.00)
GFR calc Af Amer: >60 mL/min (ref >60)
GFR calc non Af Amer: >60 mL/min (ref >60)
Glucose, Bld: 162 mg/dL — ABNORMAL HIGH (ref 65–99)
Potassium: 4 mmol/L (ref 3.5–5.1)
Sodium: 135 mmol/L (ref 135–145)
Total Bilirubin: 0.4 mg/dL (ref 0.3–1.2)
Total Protein: 7.1 g/dL (ref 6.5–8.1)

## 2016-12-03 LAB — HEMOGLOBIN A1C
Hgb A1c MFr Bld: 6.1 % — ABNORMAL HIGH (ref 4.8–5.6)
Mean Plasma Glucose: 128.37 mg/dL

## 2016-12-03 LAB — TSH: TSH: 8.615 u[IU]/mL — ABNORMAL HIGH (ref 0.350–4.500)

## 2016-12-03 MED ORDER — HEPARIN SOD (PORK) LOCK FLUSH 100 UNIT/ML IV SOLN
500.0000 [IU] | Freq: Once | INTRAVENOUS | Status: AC | PRN
  Administered 2016-12-03: 500 [IU]
  Filled 2016-12-03: qty 5

## 2016-12-03 MED ORDER — DOCETAXEL CHEMO INJECTION 160 MG/16ML
75.0000 mg/m2 | Freq: Once | INTRAVENOUS | Status: AC
  Administered 2016-12-03: 130 mg via INTRAVENOUS
  Filled 2016-12-03: qty 13

## 2016-12-03 MED ORDER — SODIUM CHLORIDE 0.9 % IV SOLN
1000.0000 mg | Freq: Once | INTRAVENOUS | Status: AC
  Administered 2016-12-03: 1000 mg via INTRAVENOUS
  Filled 2016-12-03: qty 50

## 2016-12-03 MED ORDER — SODIUM CHLORIDE 0.9% FLUSH
10.0000 mL | INTRAVENOUS | Status: DC | PRN
  Administered 2016-12-03: 10 mL
  Filled 2016-12-03: qty 10

## 2016-12-03 MED ORDER — SODIUM CHLORIDE 0.9 % IV SOLN
Freq: Once | INTRAVENOUS | Status: AC
  Administered 2016-12-03: 12:00:00 via INTRAVENOUS
  Filled 2016-12-03: qty 1000

## 2016-12-03 MED ORDER — DEXAMETHASONE SODIUM PHOSPHATE 10 MG/ML IJ SOLN
10.0000 mg | Freq: Once | INTRAMUSCULAR | Status: AC
  Administered 2016-12-03: 10 mg via INTRAVENOUS
  Filled 2016-12-03: qty 1

## 2016-12-03 MED ORDER — PEGFILGRASTIM 6 MG/0.6ML ~~LOC~~ PSKT
6.0000 mg | PREFILLED_SYRINGE | Freq: Once | SUBCUTANEOUS | Status: AC
  Administered 2016-12-03: 6 mg via SUBCUTANEOUS
  Filled 2016-12-03: qty 0.6

## 2016-12-03 MED ORDER — PALONOSETRON HCL INJECTION 0.25 MG/5ML
0.2500 mg | Freq: Once | INTRAVENOUS | Status: AC
  Administered 2016-12-03: 0.25 mg via INTRAVENOUS
  Filled 2016-12-03: qty 5

## 2016-12-03 NOTE — Progress Notes (Signed)
Patient denies any concerns today.  

## 2016-12-04 ENCOUNTER — Inpatient Hospital Stay: Payer: Medicaid Other

## 2016-12-08 ENCOUNTER — Inpatient Hospital Stay: Payer: Medicaid Other

## 2016-12-08 NOTE — Progress Notes (Signed)
Nutrition Assessment   Reason for Assessment:   Patient identified on Malnutrition Screening report for weight loss and poor appetite.  ASSESSMENT:  61 year old female with breast cancer.  Patient receiving chemotherapy.  Patient s/p lumpectomy, HTN, hypothyroidism, acute aortitis, cervical cancer, acute cholecystitis.    Spoke with patient via [employer] [place] ID# [phone]) over the phone.  Patient reports that she has no appetite, feels nauseated and has dry mouth.  Reports that she mainly drinks water.  Did have ensure and fruit shake this am but nothing else.  Reports yesterday ate oatmeal and juice for breakfast, soup for lunch and eggs with tomatoes and onions for dinner.  Reports that she is not taking nausea medication as she was told by MD to stop taking it.    Nutrition Focused Physical Exam: deferred  Medications: reviewed  Labs: reviewed  Anthropometrics:   Height: 61 inches Weight: 154 lb on 9/19 UBW: 160 lb on 09/2016 BMI: 29  3% weight loss in 2 months, not significant   Estimated Energy Needs  Kcals: 1500-1750 calories/d Protein: 72-96 g/d Fluid: 1.7 L/d  NUTRITION DIAGNOSIS: Inadequate oral intake related to nausea, dry mouth, poor appetite as evidenced by weight loss   MALNUTRITION DIAGNOSIS: continue to monitor    INTERVENTION:   Discussed nausea medication with MD Grayland Ormond and patient can resume zofran and compazine as previously recommended.  Patient reports she has medication at home.   Discussed strategies to help with dry mouth. Encouraged 2-3 ensure enlive/plus per day to provide additional calories and protein and prevent further weight loss.  Samples and coupons given today. Contact information provided and patient can call or make appointment as needed    MONITORING, EVALUATION, GOAL: weight trends, intake   NEXT VISIT: as needed  Teralyn Mullins B. Zenia Resides, [place], [place] Registered Dietitian [phone] (pager)

## 2016-12-09 ENCOUNTER — Encounter: Payer: Self-pay | Admitting: General Surgery

## 2016-12-09 ENCOUNTER — Inpatient Hospital Stay: Payer: Medicaid Other

## 2016-12-09 ENCOUNTER — Inpatient Hospital Stay (HOSPITAL_BASED_OUTPATIENT_CLINIC_OR_DEPARTMENT_OTHER): Payer: Medicaid Other | Admitting: Oncology

## 2016-12-09 ENCOUNTER — Ambulatory Visit (INDEPENDENT_AMBULATORY_CARE_PROVIDER_SITE_OTHER): Payer: Medicaid Other | Admitting: General Surgery

## 2016-12-09 VITALS — BP 128/68 | HR 108 | Resp 16 | Ht 60.0 in | Wt 153.0 lb

## 2016-12-09 VITALS — BP 107/65 | HR 102 | Temp 99.7°F | Resp 18 | Wt 153.9 lb

## 2016-12-09 DIAGNOSIS — Z79899 Other long term (current) drug therapy: Secondary | ICD-10-CM

## 2016-12-09 DIAGNOSIS — R5383 Other fatigue: Secondary | ICD-10-CM | POA: Diagnosis not present

## 2016-12-09 DIAGNOSIS — E871 Hypo-osmolality and hyponatremia: Secondary | ICD-10-CM

## 2016-12-09 DIAGNOSIS — E039 Hypothyroidism, unspecified: Secondary | ICD-10-CM

## 2016-12-09 DIAGNOSIS — R531 Weakness: Secondary | ICD-10-CM

## 2016-12-09 DIAGNOSIS — R109 Unspecified abdominal pain: Secondary | ICD-10-CM

## 2016-12-09 DIAGNOSIS — C50411 Malignant neoplasm of upper-outer quadrant of right female breast: Secondary | ICD-10-CM

## 2016-12-09 DIAGNOSIS — F419 Anxiety disorder, unspecified: Secondary | ICD-10-CM | POA: Diagnosis not present

## 2016-12-09 DIAGNOSIS — R634 Abnormal weight loss: Secondary | ICD-10-CM

## 2016-12-09 DIAGNOSIS — R197 Diarrhea, unspecified: Secondary | ICD-10-CM | POA: Diagnosis not present

## 2016-12-09 DIAGNOSIS — R509 Fever, unspecified: Secondary | ICD-10-CM

## 2016-12-09 DIAGNOSIS — I776 Arteritis, unspecified: Secondary | ICD-10-CM

## 2016-12-09 DIAGNOSIS — R519 Headache, unspecified: Secondary | ICD-10-CM

## 2016-12-09 DIAGNOSIS — M898X0 Other specified disorders of bone, multiple sites: Secondary | ICD-10-CM | POA: Diagnosis not present

## 2016-12-09 DIAGNOSIS — Z5111 Encounter for antineoplastic chemotherapy: Secondary | ICD-10-CM | POA: Diagnosis not present

## 2016-12-09 DIAGNOSIS — M549 Dorsalgia, unspecified: Secondary | ICD-10-CM

## 2016-12-09 DIAGNOSIS — D649 Anemia, unspecified: Secondary | ICD-10-CM | POA: Diagnosis not present

## 2016-12-09 DIAGNOSIS — R51 Headache: Secondary | ICD-10-CM

## 2016-12-09 DIAGNOSIS — M4316 Spondylolisthesis, lumbar region: Secondary | ICD-10-CM

## 2016-12-09 DIAGNOSIS — I10 Essential (primary) hypertension: Secondary | ICD-10-CM

## 2016-12-09 DIAGNOSIS — R110 Nausea: Secondary | ICD-10-CM | POA: Diagnosis not present

## 2016-12-09 DIAGNOSIS — N1330 Unspecified hydronephrosis: Secondary | ICD-10-CM

## 2016-12-09 DIAGNOSIS — K7460 Unspecified cirrhosis of liver: Secondary | ICD-10-CM

## 2016-12-09 DIAGNOSIS — Z170 Estrogen receptor positive status [ER+]: Secondary | ICD-10-CM

## 2016-12-09 DIAGNOSIS — R1110 Vomiting, unspecified: Secondary | ICD-10-CM

## 2016-12-09 LAB — CBC WITH DIFFERENTIAL/PLATELET
Basophils Absolute: 0 10*3/uL (ref 0–0.1)
Basophils Relative: 2 %
Eosinophils Absolute: 0 10*3/uL (ref 0–0.7)
Eosinophils Relative: 2 %
HCT: 31 % — ABNORMAL LOW (ref 35.0–47.0)
Hemoglobin: 10.7 g/dL — ABNORMAL LOW (ref 12.0–16.0)
Lymphocytes Relative: 29 %
Lymphs Abs: 0.4 10*3/uL — ABNORMAL LOW (ref 1.0–3.6)
MCH: 32.3 pg (ref 26.0–34.0)
MCHC: 34.6 g/dL (ref 32.0–36.0)
MCV: 93.3 fL (ref 80.0–100.0)
Monocytes Absolute: 0.3 10*3/uL (ref 0.2–0.9)
Monocytes Relative: 18 %
Neutro Abs: 0.7 10*3/uL — ABNORMAL LOW (ref 1.4–6.5)
Neutrophils Relative %: 49 %
Platelets: 112 10*3/uL — ABNORMAL LOW (ref 150–440)
RBC: 3.32 MIL/uL — ABNORMAL LOW (ref 3.80–5.20)
RDW: 14 % (ref 11.5–14.5)
WBC: 1.4 10*3/uL — CL (ref 3.6–11.0)

## 2016-12-09 LAB — URINALYSIS, COMPLETE (UACMP) WITH MICROSCOPIC
Bacteria, UA: NONE SEEN
Bilirubin Urine: NEGATIVE
Glucose, UA: NEGATIVE mg/dL
Hgb urine dipstick: NEGATIVE
Ketones, ur: NEGATIVE mg/dL
Leukocytes, UA: NEGATIVE
Nitrite: NEGATIVE
Protein, ur: NEGATIVE mg/dL
Specific Gravity, Urine: 1.011 (ref 1.005–1.030)
pH: 7 (ref 5.0–8.0)

## 2016-12-09 LAB — COMPREHENSIVE METABOLIC PANEL
ALT: 17 U/L (ref 14–54)
AST: 37 U/L (ref 15–41)
Albumin: 3.3 g/dL — ABNORMAL LOW (ref 3.5–5.0)
Alkaline Phosphatase: 105 U/L (ref 38–126)
Anion gap: 9 (ref 5–15)
BUN: 11 mg/dL (ref 6–20)
CO2: 24 mmol/L (ref 22–32)
Calcium: 9 mg/dL (ref 8.9–10.3)
Chloride: 100 mmol/L — ABNORMAL LOW (ref 101–111)
Creatinine, Ser: 0.6 mg/dL (ref 0.44–1.00)
GFR calc Af Amer: >60 mL/min (ref >60)
GFR calc non Af Amer: >60 mL/min (ref >60)
Glucose, Bld: 100 mg/dL — ABNORMAL HIGH (ref 65–99)
Potassium: 3.8 mmol/L (ref 3.5–5.1)
Sodium: 133 mmol/L — ABNORMAL LOW (ref 135–145)
Total Bilirubin: 0.6 mg/dL (ref 0.3–1.2)
Total Protein: 6.8 g/dL (ref 6.5–8.1)

## 2016-12-09 LAB — LIPASE, BLOOD
Lipase: 27 U/L (ref 11–51)
Lipase: 27 U/L (ref 11–51)

## 2016-12-09 LAB — AMYLASE
Amylase: 66 U/L (ref 28–100)
Amylase: 73 U/L (ref 28–100)

## 2016-12-09 MED ORDER — SODIUM CHLORIDE 0.9 % IV SOLN
INTRAVENOUS | Status: DC
  Administered 2016-12-09: 12:00:00 via INTRAVENOUS
  Filled 2016-12-09 (×2): qty 1000

## 2016-12-09 MED ORDER — ONDANSETRON HCL 4 MG PO TABS: 4.0000 mg | ORAL_TABLET | Freq: Three times a day (TID) | ORAL | 2 refills | Status: DC | PRN

## 2016-12-09 MED ORDER — HEPARIN SOD (PORK) LOCK FLUSH 100 UNIT/ML IV SOLN
INTRAVENOUS | Status: AC
  Filled 2016-12-09: qty 5

## 2016-12-09 MED ORDER — HEPARIN SOD (PORK) LOCK FLUSH 100 UNIT/ML IV SOLN
500.0000 [IU] | Freq: Once | INTRAVENOUS | Status: AC
  Administered 2016-12-09: 500 [IU] via INTRAVENOUS

## 2016-12-09 MED ORDER — ACETAMINOPHEN 325 MG PO TABS
ORAL_TABLET | ORAL | Status: AC
  Filled 2016-12-09: qty 2

## 2016-12-09 MED ORDER — LOPERAMIDE HCL 2 MG PO TABS: 2.0000 mg | ORAL_TABLET | Freq: Four times a day (QID) | ORAL | 0 refills | Status: DC | PRN

## 2016-12-09 MED ORDER — ACETAMINOPHEN 325 MG PO TABS
650.0000 mg | ORAL_TABLET | Freq: Four times a day (QID) | ORAL | Status: DC | PRN
  Administered 2016-12-09: 650 mg via ORAL

## 2016-12-09 MED ORDER — SODIUM CHLORIDE 0.9 % IV SOLN
Freq: Once | INTRAVENOUS | Status: AC
  Administered 2016-12-09: 12:00:00 via INTRAVENOUS
  Filled 2016-12-09: qty 4

## 2016-12-09 NOTE — Patient Instructions (Signed)
Patient to return on three months. The patient is aware to call back for any questions or concerns.

## 2016-12-09 NOTE — Progress Notes (Signed)
Patient ID: Wanda Reed, female   DOB: 1956/03/06, 61 y.o.   MRN: [id number]  Chief Complaint  Patient presents with  . Follow-up    HPI Wanda Reed is a 61 y.o. female here today for her two month follow up right breast lumpectomy done on 08/28/2016. Patient states she is not moving her bowels regularly.   Interpreter Herb Grays is present at visit.  HPI  Past Medical History:  Diagnosis Date  . Anxiety   . Breast cancer of upper-outer quadrant of right female breast ([place]) 08/2016   1.0 cm ER+, PR-, Her 2 neu not overexpressed. Node negative. T1b, N0.  [name] Cancer (HCC)    cervical  . Hypertension   . Hypothyroidism   . Thyroid disease     Past Surgical History:  Procedure Laterality Date  . ABDOMINAL HYSTERECTOMY    . BREAST LUMPECTOMY Right 08/28/2016  . BREAST LUMPECTOMY WITH SENTINEL LYMPH NODE BIOPSY Right 08/28/2016   Procedure: BREAST LUMPECTOMY WITH SENTINEL LYMPH NODE BX;  Surgeon: [name], MD;  Location: [facility] ORS;  Service: General;  Laterality: Right;  . BREAST MAMMOSITE Right 09/15/2016   Procedure: MAMMOSITE BREAST;  Surgeon: [name], MD;  Location: [facility] ORS;  Service: General;  Laterality: Right;  . BREAST SURGERY Right    Breast Biopsy  . CERVIX SURGERY    . COLONOSCOPY  2008  . EYE SURGERY Bilateral    Cataract Extraction with IOL  . PORTA CATH INSERTION Right 11/20/2016   Procedure: PORTA CATH INSERTION;  Surgeon: [name], MD;  Location: [facility] ORS;  Service: General;  Laterality: Right;  . SHOULDER ARTHROSCOPY Right     Family History  Problem Relation Age of Onset  . Breast cancer Neg Hx     Social History Social History  Substance Use Topics  . Smoking status: Never Smoker  . Smokeless tobacco: Never Used  . Alcohol use No    No Known Allergies  Current Outpatient Prescriptions  Medication Sig Dispense Refill  . acetaminophen (TYLENOL) 500 MG tablet Take 1,000 mg by mouth every 6 (six) hours as needed (for  pain.).    [name] acyclovir (ZOVIRAX) 400 MG tablet Take 400 mg by mouth 3 (three) times daily as needed (cold sore outbreaks).     . cetaphil (CETAPHIL) cream Apply 1 application topically as needed (dry skin).    [name] docusate sodium (COLACE) 100 MG capsule Take 100 mg by mouth 2 (two) times daily.    [name] levothyroxine (SYNTHROID, LEVOTHROID) 100 MCG tablet Take 100 mcg by mouth daily before breakfast.    . lisinopril (PRINIVIL,ZESTRIL) 10 MG tablet Take 10 mg by mouth daily.    [name] loratadine (CLARITIN) 10 MG tablet Take 10 mg by mouth as directed. Take 10 mg the day before, of, and after chemo    . Multiple Vitamin (MULTIVITAMIN WITH MINERALS) TABS tablet Take 1 tablet by mouth daily.    [name] loperamide (IMODIUM A-D) 2 MG tablet Take 1 tablet (2 mg total) by mouth 4 (four) times daily as needed for diarrhea or loose stools. 30 tablet 0  . ondansetron (ZOFRAN) 4 MG tablet Take 1 tablet (4 mg total) by mouth every 8 (eight) hours as needed for nausea or vomiting. 60 tablet 2   No current facility-administered medications for this visit.     Review of Systems Review of Systems  Constitutional: Positive for appetite change.  Respiratory: Negative.   Cardiovascular: Negative.   Gastrointestinal: Positive for diarrhea.  Neurological: Positive  for dizziness and light-headedness.    Blood pressure 128/68, pulse (!) 108, resp. rate 16, height 5' (1.524 m), weight 153 lb (69.4 kg).  Physical Exam Physical Exam  Constitutional: She is oriented to person, place, and time. She appears well-developed and well-nourished.  Eyes: Conjunctivae are normal. No scleral icterus.  Neck: Neck supple.  Cardiovascular: Normal rate, regular rhythm and normal heart sounds.   Pulmonary/Chest: Effort normal and breath sounds normal. Right breast exhibits no inverted nipple, no mass, no nipple discharge, no skin change and no tenderness. Left breast exhibits no inverted nipple, no mass, no nipple discharge, no skin change and  no tenderness.    Abdominal: Soft. Normal appearance and bowel sounds are normal. There is tenderness. No hernia.  Lymphadenopathy:    She has no cervical adenopathy.  Neurological: She is alert and oriented to person, place, and time.  Skin: Skin is warm and dry.    Data Reviewed Vital signs show evidence of moderate finding dehydration.  Assessment    Nausea vomiting and diarrhea secondary to recent chemotherapy regimen.    Plan    The patient was referred to the cancer Center and Delight Hoh, M.D. was contacted regarding the potential for IV fluids.    Patient to return on three months. The patient is aware to call back for any questions or concerns.  HPI, Physical Exam, Assessment and Plan have been scribed under the direction and in the presence of Hervey Ard, MD.  Gaspar Cola, CMA  I have completed the exam and reviewed the above documentation for accuracy and completeness.  I agree with the above.  Haematologist has been used and any errors in dictation or transcription are unintentional.  Hervey Ard, M.D., F.A.C.S.  [name] 12/10/2016, 7:19 PM

## 2016-12-09 NOTE — Progress Notes (Signed)
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 High risk MammaPrint.  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 High risk MammaPrint. Patient had lumpectomy on August 28, 2016 followed by MammoSite radiation therapy. Given her high risk MammaPrint, have recommended adjuvant chemotherapy.  At the conclusion of all her treatments, patient will benefit from an aromatase inhibitor given the ER status of her tumor.   Interval history-  Patient presents today with diarrhea that began last Thursday. Previously she had suffered with constipation and was given MiraLAX on Thursday morning.She has been having 4-5 episodes of diarrhea per day since. She has not taken any antidiarrheals such as immodium. Yesterday, she began to feel weak and tired, have constant nausea and complains of bony pain all over. She has not taken anything for her nausea. She took Tylenol for her bony pain with relief. Additionally she has no appetite and has been unable to eat full meals due to nausea. She denies vomiting. She states she's been able to keep down water without a problem. Lastly, she complains of abdominal pain that comes and goes. Yesterday she had abdominal pain and was able to control the pain with Tylenol. Today she states the pain  is a 8 out of 10. She was recently seen in the emergency room for right upper quadrant pain. A CT scan revealed acute aortitis involving the distal descending thoracic and upper abdominal aorta. A vascular surgeon and general surgery consult was placed. It was also recommended she see outpatient rheumatology to rule out vasculitis. She saw general surgery today but she is unclear of what was discussed. Dr. Bary Castilla sent her to [place] so that we could assess her.  ECOG FS:0 - Asymptomatic  Review of systems- Review of Systems  Constitutional: Positive for fever, malaise/fatigue and weight loss.  HENT: Negative.   Eyes: Negative.   Respiratory: Negative for cough, hemoptysis, sputum production, shortness of breath and wheezing.   Cardiovascular: Negative for chest pain, palpitations and leg swelling.  Gastrointestinal: Positive for abdominal pain, diarrhea and nausea. Negative for blood in stool, constipation, heartburn and vomiting.  Genitourinary: Negative.   Musculoskeletal: Negative.   Skin: Negative.   Neurological: Positive for weakness.  Endo/Heme/Allergies: Negative.   Psychiatric/Behavioral: Negative.      Current treatment- Taxotere, Cytoxan and Neulasta. Day 1 Cycle 3 on 12/03/16.  No Known Allergies   Past Medical History:  Diagnosis Date  . Anxiety   . Breast cancer of upper-outer quadrant of right female breast ([place]) 08/2016   1.0 cm ER+, PR-, Her 2 neu not overexpressed. Node negative. T1b, N0.  [name] Cancer (HCC)    cervical  . Hypertension   . Hypothyroidism   . Thyroid disease      Past Surgical History:  Procedure Laterality Date  . ABDOMINAL HYSTERECTOMY    . BREAST LUMPECTOMY Right 08/28/2016  . BREAST  LUMPECTOMY WITH SENTINEL LYMPH NODE BIOPSY Right 08/28/2016   Procedure: BREAST LUMPECTOMY WITH SENTINEL LYMPH NODE BX;  Surgeon: [name], MD;  Location: [facility] ORS;  Service: General;  Laterality: Right;  . BREAST MAMMOSITE Right 09/15/2016   Procedure:  MAMMOSITE BREAST;  Surgeon: [name], MD;  Location: [facility] ORS;  Service: General;  Laterality: Right;  . BREAST SURGERY Right    Breast Biopsy  . CERVIX SURGERY    . COLONOSCOPY  2008  . EYE SURGERY Bilateral    Cataract Extraction with IOL  . PORTA CATH INSERTION Right 11/20/2016   Procedure: PORTA CATH INSERTION;  Surgeon: [name], MD;  Location: [facility] ORS;  Service: General;  Laterality: Right;  . SHOULDER ARTHROSCOPY Right     Social History   Social History  . Marital status: Single    Spouse name: N/A  . Number of children: N/A  . Years of education: N/A   Occupational History  . Not on file.   Social History Main Topics  . Smoking status: Never Smoker  . Smokeless tobacco: Never Used  . Alcohol use No  . Drug use: No  . Sexual activity: Not on file   Other Topics Concern  . Not on file   Social History Narrative  . No narrative on file    Family History  Problem Relation Age of Onset  . Breast cancer Neg Hx      Current Outpatient Prescriptions:  .  acyclovir (ZOVIRAX) 400 MG tablet, Take 400 mg by mouth 3 (three) times daily as needed (cold sore outbreaks). , Disp: , Rfl:  .  cetaphil (CETAPHIL) cream, Apply 1 application topically as needed (dry skin)., Disp: , Rfl:  .  levothyroxine (SYNTHROID, LEVOTHROID) 100 MCG tablet, Take 100 mcg by mouth daily before breakfast., Disp: , Rfl:  .  lisinopril (PRINIVIL,ZESTRIL) 10 MG tablet, Take 10 mg by mouth daily., Disp: , Rfl:  .  loratadine (CLARITIN) 10 MG tablet, Take 10 mg by mouth as directed. Take 10 mg the day before, of, and after chemo, Disp: , Rfl:  .  Multiple Vitamin (MULTIVITAMIN WITH MINERALS) TABS tablet, Take 1 tablet by mouth daily., Disp: , Rfl:  .  acetaminophen (TYLENOL) 500 MG tablet, Take 1,000 mg by mouth every 6 (six) hours as needed (for pain.)., Disp: , Rfl:  .  docusate sodium (COLACE) 100 MG capsule, Take 100 mg by mouth 2 (two) times daily., Disp: , Rfl:  .   loperamide (IMODIUM A-D) 2 MG tablet, Take 1 tablet (2 mg total) by mouth 4 (four) times daily as needed for diarrhea or loose stools., Disp: 30 tablet, Rfl: 0 .  ondansetron (ZOFRAN) 4 MG tablet, Take 1 tablet (4 mg total) by mouth every 8 (eight) hours as needed for nausea or vomiting., Disp: 60 tablet, Rfl: 2 No current facility-administered medications for this visit.   Facility-Administered Medications Ordered in Other Visits:  .  0.9 %  sodium chloride infusion, , Intravenous, Continuous, Burns, Wandra Feinstein, NP, Stopped at 12/09/16 1320 .  acetaminophen (TYLENOL) tablet 650 mg, 650 mg, Oral, Q6H PRN, Jacquelin Hawking, NP, 650 mg at 12/09/16 1400  Physical exam:  Vitals:   12/09/16 1042  BP: 107/65  Pulse: (!) 102  Resp: 18  Temp: 99.7 F (37.6 C)  TempSrc: Tympanic  Weight: 153 lb 14.4 oz (69.8 kg)   Physical Exam  Constitutional: She is oriented to person, place, and time and well-developed, well-nourished, and in no  distress.  HENT:  Head: Normocephalic and atraumatic.  Eyes: Pupils are equal, round, and reactive to light. Conjunctivae and EOM are normal.  Neck: Normal range of motion. Neck supple.  Cardiovascular: Regular rhythm.  Tachycardia present.   Pulmonary/Chest: Effort normal and breath sounds normal.  Abdominal: Soft. Normal appearance. Bowel sounds are hyperactive. There is no hepatosplenomegaly. There is generalized tenderness and tenderness in the right upper quadrant, right lower quadrant, epigastric area, left upper quadrant and left lower quadrant. There is guarding.  Musculoskeletal: Normal range of motion.  Neurological: She is alert and oriented to person, place, and time.  Skin: Skin is warm and dry.  Psychiatric: Affect normal.     CMP Latest Ref Rng & Units 12/09/2016  Glucose 65 - 99 mg/dL 100(H)  BUN 6 - 20 mg/dL 11  Creatinine 0.44 - 1.00 mg/dL 0.60  Sodium 135 - 145 mmol/L 133(L)  Potassium 3.5 - 5.1 mmol/L 3.8  Chloride 101 - 111 mmol/L 100(L)   CO2 22 - 32 mmol/L 24  Calcium 8.9 - 10.3 mg/dL 9.0  Total Protein 6.5 - 8.1 g/dL 6.8  Total Bilirubin 0.3 - 1.2 mg/dL 0.6  Alkaline Phos 38 - 126 U/L 105  AST 15 - 41 U/L 37  ALT 14 - 54 U/L 17   CBC Latest Ref Rng & Units 12/09/2016  WBC 3.6 - 11.0 K/uL 1.4(LL)  Hemoglobin 12.0 - 16.0 g/dL 10.7(L)  Hematocrit 35.0 - 47.0 % 31.0(L)  Platelets 150 - 440 K/uL 112(L)    No images are attached to the encounter.  Dg Chest 2 View  Result Date: 11/27/2016 CLINICAL DATA:  Pt has right side abd pain for 4 days. Pt also has a fever for days. No v/d Pt also reports back pain. Pt alert. Interpreter with pt. Hx breast cancer pt Last chemo was end of August, history of hypertension, hypothyroidism EXAM: CHEST  2 VIEW COMPARISON:  11/20/2016 FINDINGS: The cardiac silhouette is normal in size and configuration. No mediastinal or hilar masses. No evidence of adenopathy. Clear lungs.  No pleural effusion or pneumothorax. Right anterior chest wall, internal jugular Port-A-Cath is stable. Skeletal structures are intact. IMPRESSION: No active cardiopulmonary disease. Electronically Signed   By: Lajean Manes M.D.   On: 11/27/2016 17:50   Ct Abdomen Pelvis W Contrast  Result Date: 11/27/2016 CLINICAL DATA:  RIGHT upper quadrant pain for 4 days, fever, back pain. History of breast and cervical cancer. EXAM: CT ABDOMEN AND PELVIS WITH CONTRAST TECHNIQUE: Multidetector CT imaging of the abdomen and pelvis was performed using the standard protocol following bolus administration of intravenous contrast. CONTRAST:  140m ISOVUE-300 IOPAMIDOL (ISOVUE-300) INJECTION 61% COMPARISON:  CT abdomen and pelvis October 28, 2016 abdominal ultrasound November 27, 2016 FINDINGS: LOWER CHEST dependent atelectasis. Included heart size is normal. No pericardial effusion. HEPATOBILIARY: Nodular liver contour without solid mass. subcentimeter hypodensity in dome of the liver. Stable 2.2 cm cyst in LEFT lobe of the liver. PANCREAS:  Normal. SPLEEN: Normal. ADRENALS/URINARY TRACT: Kidneys are orthotopic, demonstrating symmetric enhancement. No nephrolithiasis, or solid renal masses. Multifocal RIGHT renal scarring and mild atrophy and mild chronic hydronephrosis. Too small to characterize hypodensity lower pole RIGHT kidney. The unopacified ureters are normal in course and caliber. Delayed imaging through the kidneys demonstrates symmetric prompt contrast excretion within the proximal urinary collecting system. Urinary bladder is well distended and unremarkable. Normal adrenal glands. STOMACH/BOWEL: The stomach, small and large bowel are normal in course and caliber without inflammatory changes. Mild colonic diverticulosis. Normal  appendix. VASCULAR/LYMPHATIC: Aortoiliac vessels are normal in course and caliber. New irregular soft tissue about the distal thoracic and proximal suprarenal abdominal aorta with periaortic fat stranding. Mild calcific atherosclerosis. No lymphadenopathy by CT size criteria. Stable subcentimeter retroperitoneal lymph nodes. Status post pelvic side wall nodal dissection. REPRODUCTIVE: Status post hysterectomy. OTHER: No intraperitoneal free fluid or free air. MUSCULOSKELETAL: Nonacute. Grade 1 L4-5 anterolisthesis without spondylolysis. Anterior abdominal wall scarring. Moderate L2-3 disc osteophyte complex. IMPRESSION: 1. 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 Superior extent of involvement incompletely imaged. 2. 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 3. Mildly atrophic RIGHT kidney with mild chronic hydronephrosis. 4.  Aortic Atherosclerosis (ICD10-I70.0). Acute findings discussed with and reconfirmed by [facility] on 11/27/2016 at 7:23 pm. Electronically Signed   By: Elon Alas M.D.   On: 11/27/2016 19:29   Dg Chest Port 1 View  Result Date: 11/20/2016 CLINICAL DATA:  Status post Port-A-Cath placement. EXAM: PORTABLE CHEST 1 VIEW COMPARISON:  Radiographs of November 04, 2016. FINDINGS: Stable cardiomediastinal silhouette. No pneumothorax or pleural effusion is noted. Interval placement of right internal jugular Port-A-Cath with distal tip in expected position of cavoatrial junction. Both lungs are clear. The visualized skeletal structures are unremarkable. IMPRESSION: Interval placement of right internal jugular Port-A-Cath with distal tip in expected position of cavoatrial junction. No acute cardiopulmonary abnormality seen. Electronically Signed   By: Marijo Conception, M.D.   On: 11/20/2016 09:04   Dg C-arm 1-60 Min-no Report  Result Date: 11/20/2016 Fluoroscopy was utilized by the requesting physician.  No radiographic interpretation.   US Abdomen Limited Ruq  Result Date: 11/27/2016 CLINICAL DATA:  Right upper quadrant pain. EXAM: ULTRASOUND ABDOMEN LIMITED RIGHT UPPER QUADRANT COMPARISON:  Abdominal CT 10/28/2016 FINDINGS: Gallbladder: No gallstones or wall thickening visualized. No sonographic Murphy sign noted by sonographer. Common bile duct: Diameter: 3 mm Liver: Simple 25 mm left hepatic cyst. There is heterogeneous echotexture this patient with cirrhotic changes on CT. Patent antegrade flow in the imaged hepatic and portal venous system. Moderate right hydronephrosis. A right ureteral jet was not visualized. IMPRESSION: 1. Right hydronephrosis. The bladder was scanned and a ureteral jet was never seen on the right. 2. Negative gallbladder. 3. Heterogeneous liver correlating with cirrhotic morphology on recent CT. Electronically Signed   By: Monte Fantasia M.D.   On: 11/27/2016 17:35     Assessment and plan- Patient is a 61 y.o. female who presents with diarrhea, abdominal pain, fatigue and nausea. Labs were drawn today. She is hyponatremic (133), neutropenic (1.4) , anemic (10.7) and thrombocytopenic (112).  Her lungs are clear. She has constant abdominal tenderness throughout her abdomen. She is negative for rebound tenderness. She is afebrile. She is  tachycardic (HR 103). Frequently she is spitting into a green "vomit" bag because she has so much saliva.   1. Dehydration/Hyponatremia: I liter Normal Saline.  2. Nausea without vomiting: 8 mg Zofran and 10 mg Decadron IV today. RX Zofran 4 mg TID.  3. Diarrhea: Stool Sample. R/O not infectious. Sent home with her. RX Imodium 2 mg QID.  4. Abdominal Pain: Labs today. Added lipase/amylase, cbc with diff and cmp. Recently had CT of abdomen with findings suggesting acute aortitis involving the distal descending thoracic and upper abdominal aorta. Met with Dr. Fleet Contras today. Will wait for note to see what was discussed. Referral to GI placed.  5. Headache: Continue Tylenol PRN. Give 650 mg of tylenol during infusion.  6. Fever: Low grade. 99.9. UA and Urine  culture today. Currently afebrile.  7. Weakness: IV Decadron today.  8. RTC as scheduled to see Dr. Grayland Ormond.   Visit Diagnosis 1. Fever, unspecified fever cause   2. Abdominal pain, unspecified abdominal location   3. Diarrhea of presumed infectious origin     Patient expressed understanding and was in agreement with this plan. She also understands that She can call clinic at any time with any questions, concerns, or complaints.    Marisue Humble [facility] Pager- [phone] 12/09/2016 2:59 PM

## 2016-12-09 NOTE — Progress Notes (Signed)
Pt has been to see Dr Tollie Pizza this am for surgical f/u. Here today for (see follow up notes ) -of diarrhea,decreased appetite and nausea. Interpretor Maritsa assisted  w evaluation.

## 2016-12-10 DIAGNOSIS — R1110 Vomiting, unspecified: Secondary | ICD-10-CM | POA: Insufficient documentation

## 2016-12-10 DIAGNOSIS — R197 Diarrhea, unspecified: Secondary | ICD-10-CM

## 2016-12-10 LAB — URINE CULTURE: Culture: NO GROWTH

## 2016-12-14 ENCOUNTER — Emergency Department
Admission: EM | Admit: 2016-12-14 | Discharge: 2016-12-14 | Disposition: A | Discharge status: Home / Self Care | Payer: Medicaid Other | Attending: Emergency Medicine | Admitting: Emergency Medicine

## 2016-12-14 ENCOUNTER — Emergency Department: Payer: Medicaid Other

## 2016-12-14 DIAGNOSIS — Z853 Personal history of malignant neoplasm of breast: Secondary | ICD-10-CM | POA: Diagnosis not present

## 2016-12-14 DIAGNOSIS — G501 Atypical facial pain: Secondary | ICD-10-CM | POA: Diagnosis not present

## 2016-12-14 DIAGNOSIS — L03113 Cellulitis of right upper limb: Secondary | ICD-10-CM | POA: Insufficient documentation

## 2016-12-14 DIAGNOSIS — R200 Anesthesia of skin: Secondary | ICD-10-CM | POA: Diagnosis not present

## 2016-12-14 DIAGNOSIS — I791 Aortitis in diseases classified elsewhere: Secondary | ICD-10-CM | POA: Insufficient documentation

## 2016-12-14 DIAGNOSIS — I10 Essential (primary) hypertension: Secondary | ICD-10-CM | POA: Insufficient documentation

## 2016-12-14 DIAGNOSIS — Z79899 Other long term (current) drug therapy: Secondary | ICD-10-CM | POA: Insufficient documentation

## 2016-12-14 DIAGNOSIS — R51 Headache: Secondary | ICD-10-CM

## 2016-12-14 DIAGNOSIS — R519 Headache, unspecified: Secondary | ICD-10-CM

## 2016-12-14 DIAGNOSIS — Z8541 Personal history of malignant neoplasm of cervix uteri: Secondary | ICD-10-CM | POA: Insufficient documentation

## 2016-12-14 DIAGNOSIS — E039 Hypothyroidism, unspecified: Secondary | ICD-10-CM | POA: Insufficient documentation

## 2016-12-14 LAB — CBC WITH DIFFERENTIAL/PLATELET
Basophils Absolute: 0.1 10*3/uL (ref 0–0.1)
Basophils Relative: 0 %
Eosinophils Absolute: 0 10*3/uL (ref 0–0.7)
Eosinophils Relative: 0 %
HCT: 30.8 % — ABNORMAL LOW (ref 35.0–47.0)
Hemoglobin: 10.4 g/dL — ABNORMAL LOW (ref 12.0–16.0)
Lymphocytes Relative: 4 %
Lymphs Abs: 0.9 10*3/uL — ABNORMAL LOW (ref 1.0–3.6)
MCH: 32.3 pg (ref 26.0–34.0)
MCHC: 33.8 g/dL (ref 32.0–36.0)
MCV: 95.5 fL (ref 80.0–100.0)
Monocytes Absolute: 0.8 10*3/uL (ref 0.2–0.9)
Monocytes Relative: 4 %
Neutro Abs: 19.7 10*3/uL — ABNORMAL HIGH (ref 1.4–6.5)
Neutrophils Relative %: 92 %
Platelets: 110 10*3/uL — ABNORMAL LOW (ref 150–440)
RBC: 3.22 MIL/uL — ABNORMAL LOW (ref 3.80–5.20)
RDW: 14.7 % — ABNORMAL HIGH (ref 11.5–14.5)
WBC: 21.4 10*3/uL — ABNORMAL HIGH (ref 3.6–11.0)

## 2016-12-14 LAB — BASIC METABOLIC PANEL
Anion gap: 8 (ref 5–15)
BUN: 6 mg/dL (ref 6–20)
CO2: 26 mmol/L (ref 22–32)
Calcium: 8.7 mg/dL — ABNORMAL LOW (ref 8.9–10.3)
Chloride: 104 mmol/L (ref 101–111)
Creatinine, Ser: 0.9 mg/dL (ref 0.44–1.00)
GFR calc Af Amer: >60 mL/min (ref >60)
GFR calc non Af Amer: >60 mL/min (ref >60)
Glucose, Bld: 125 mg/dL — ABNORMAL HIGH (ref 65–99)
Potassium: 4.1 mmol/L (ref 3.5–5.1)
Sodium: 138 mmol/L (ref 135–145)

## 2016-12-14 LAB — GLUCOSE, CAPILLARY: Glucose-Capillary: 114 mg/dL — ABNORMAL HIGH (ref 65–99)

## 2016-12-14 LAB — SEDIMENTATION RATE: Sed Rate: 65 mm/hr — ABNORMAL HIGH (ref 0–30)

## 2016-12-14 MED ORDER — PREDNISONE 20 MG PO TABS: 60.0000 mg | ORAL_TABLET | Freq: Every day | ORAL | 0 refills | Status: AC

## 2016-12-14 MED ORDER — CEPHALEXIN 500 MG PO CAPS: 500.0000 mg | ORAL_CAPSULE | Freq: Four times a day (QID) | ORAL | 0 refills | Status: AC

## 2016-12-14 MED ORDER — PREDNISONE 20 MG PO TABS
60.0000 mg | ORAL_TABLET | Freq: Once | ORAL | Status: AC
  Administered 2016-12-14: 60 mg via ORAL
  Filled 2016-12-14: qty 3

## 2016-12-14 MED ORDER — CEPHALEXIN 500 MG PO CAPS
500.0000 mg | ORAL_CAPSULE | Freq: Once | ORAL | Status: AC
  Administered 2016-12-14: 500 mg via ORAL
  Filled 2016-12-14: qty 1

## 2016-12-14 NOTE — ED Notes (Signed)
ED Provider at bedside. 

## 2016-12-14 NOTE — ED Notes (Signed)
Numbness to right side of face upon awakening and 0730 today. Right sided arm rash and burning of rash. Pt received chemo last 9/19. Pt alert and oriented X4, active, cooperative, pt in NAD. RR even and unlabored, color WNL.

## 2016-12-14 NOTE — ED Notes (Signed)
CBG 114 

## 2016-12-14 NOTE — Discharge Instructions (Signed)
Return to the ER for new or worsening numbness in the face, weakness or drooping in the face, swelling or rash of the face, worsening vision, worsening eye pain, or any other new or worsening symptoms that concern you. You should also return to the ER for new or worsening rash in the right arm or fever.   Take both the prednisone and the steroid as prescribed.  Follow up with your doctor this week; we have also given you a referral to see the eye doctor this week.   You should inform your doctor we were concerned for temporal arteritis.

## 2016-12-14 NOTE — ED Notes (Signed)
Patient transported to CT 

## 2016-12-14 NOTE — ED Provider Notes (Addendum)
[facility] Emergency Department Provider Note ____________________________________________   First MD Initiated Contact with Patient 12/14/16 1850     (approximate)  I have reviewed the triage vital signs and the nursing notes.   HISTORY  Chief Complaint Numbness    HPI Wanda Reed is a 61 y.o. female with a history of breast cancer on chemotherapy (last chemotherapy 9/19) who presents with right-sided facial numbness, acute onset when she woke today approximately 11 hours ago, and associated with pain around her right eye and right temple. Patient also reports a rash to her right upper arm for last day. She denies any other weakness or numbness in her extremities other parts of her body, denies headache, and denies fever. No prior history of this numbness. No prior history of the rash either. No injury to the right arm.   Past Medical History:  Diagnosis Date  . Anxiety   . Breast cancer of upper-outer quadrant of right female breast ([place]) 08/2016   1.0 cm ER+, PR-, Her 2 neu not overexpressed. Node negative. T1b, N0.  [name] Cancer (HCC)    cervical  . Hypertension   . Hypothyroidism   . Thyroid disease     Patient Active Problem List   Diagnosis Date Noted  . Vomiting and diarrhea 12/10/2016  . Aortitis ([place]) 11/27/2016  . Sepsis ([place]) 10/26/2016  . Hyperkalemia 08/28/2016  . Wide-complex tachycardia ([place]) 08/28/2016  . Malignant neoplasm of upper-outer quadrant of right breast in female, estrogen receptor positive ([place]) 08/18/2016    Past Surgical History:  Procedure Laterality Date  . ABDOMINAL HYSTERECTOMY    . BREAST LUMPECTOMY Right 08/28/2016  . BREAST LUMPECTOMY WITH SENTINEL LYMPH NODE BIOPSY Right 08/28/2016   Procedure: BREAST LUMPECTOMY WITH SENTINEL LYMPH NODE BX;  Surgeon: [name], MD;  Location: [facility] ORS;  Service: General;  Laterality: Right;  . BREAST MAMMOSITE Right 09/15/2016   Procedure: MAMMOSITE BREAST;   Surgeon: [name], MD;  Location: [facility] ORS;  Service: General;  Laterality: Right;  . BREAST SURGERY Right    Breast Biopsy  . CERVIX SURGERY    . COLONOSCOPY  2008  . EYE SURGERY Bilateral    Cataract Extraction with IOL  . PORTA CATH INSERTION Right 11/20/2016   Procedure: PORTA CATH INSERTION;  Surgeon: [name], MD;  Location: [facility] ORS;  Service: General;  Laterality: Right;  . SHOULDER ARTHROSCOPY Right     Prior to Admission medications   Medication Sig Start Date End Date Taking? Authorizing Provider  acetaminophen (TYLENOL) 500 MG tablet Take 1,000 mg by mouth every 6 (six) hours as needed (for pain.).    [provider]  acyclovir (ZOVIRAX) 400 MG tablet Take 400 mg by mouth 3 (three) times daily as needed (cold sore outbreaks).     [provider]  cetaphil (CETAPHIL) cream Apply 1 application topically as needed (dry skin).    [provider]  docusate sodium (COLACE) 100 MG capsule Take 100 mg by mouth 2 (two) times daily.    [provider]  levothyroxine (SYNTHROID, LEVOTHROID) 100 MCG tablet Take 100 mcg by mouth daily before breakfast.    [provider]  lisinopril (PRINIVIL,ZESTRIL) 10 MG tablet Take 10 mg by mouth daily.    [provider]  loperamide (IMODIUM A-D) 2 MG tablet Take 1 tablet (2 mg total) by mouth 4 (four) times daily as needed for diarrhea or loose stools. 12/09/16   Jacquelin Hawking, NP  loratadine (  CLARITIN) 10 MG tablet Take 10 mg by mouth as directed. Take 10 mg the day before, of, and after chemo    [provider]  Multiple Vitamin (MULTIVITAMIN WITH MINERALS) TABS tablet Take 1 tablet by mouth daily.    [provider]  ondansetron (ZOFRAN) 4 MG tablet Take 1 tablet (4 mg total) by mouth every 8 (eight) hours as needed for nausea or vomiting. 12/09/16   Jacquelin Hawking, NP    Allergies Patient has no known allergies.  Family History  Problem Relation Age  of Onset  . Breast cancer Neg Hx     Social History Social History  Substance Use Topics  . Smoking status: Never Smoker  . Smokeless tobacco: Never Used  . Alcohol use No    Review of Systems  Constitutional: No fever/chills Eyes: R eye blurred vision.  R eye pain.  ENT: No sore throat. Cardiovascular: Denies chest pain. Respiratory: Denies shortness of breath. Gastrointestinal: No nausea, no vomiting.   Genitourinary: Negative for dysuria.  Musculoskeletal: Negative for back pain. Skin: Positive for R arm rash. Neurological: Negative for headache.  Positive for R facial numbness.    ____________________________________________   PHYSICAL EXAM:  VITAL SIGNS: ED Triage Vitals  Enc Vitals Group     BP 12/14/16 1834 (!) 135/58     Pulse Rate 12/14/16 1834 85     Resp 12/14/16 1834 18     Temp 12/14/16 1834 99.5 F (37.5 C)     Temp Source 12/14/16 1834 Oral     SpO2 12/14/16 1834 99 %     Weight 12/14/16 1834 153 lb (69.4 kg)     Height 12/14/16 1834 5' (1.524 m)     Head Circumference --      Peak Flow --      Pain Score 12/14/16 1833 7     Pain Loc --      Pain Edu? --      Excl. in [place]? --     Constitutional: Alert and oriented. Well appearing and in no acute distress. Eyes: Conjunctivae are normal. EOMI.  PERRLA.  No external abnormalities to the R eye. Head: Atraumatic. Mild R temporal tenderness. No swelling or rash.  Nose: No congestion/rhinnorhea. Mouth/Throat: Mucous membranes are moist.   Neck: Normal range of motion.  Cardiovascular: Normal rate, regular rhythm. Grossly normal heart sounds.  Good peripheral circulation. Respiratory: Normal respiratory effort.  No retractions. Lungs CTAB. Gastrointestinal: Soft and nontender. No distention.  Genitourinary: No CVA tenderness. Musculoskeletal: No lower extremity edema.  Extremities warm and well perfused.  Neurologic:  Normal speech and language. No gross focal neurologic deficits are appreciated.  CNs III-XII intact except for subjective numbness to R face in all 3 distributions; no motor abnormality.  5/5 motor strength and intact sensation to all extremities.  Normal finger-to-nose.  Skin:  Skin is warm and dry. R upper arm approx 10cm area of erythema, warm, blanching, mildly tender, with no induration or fluctuance.  No other similar lesions. Psychiatric: Mood and affect are normal. Speech and behavior are normal.  ____________________________________________   LABS (all labs ordered are listed, but only abnormal results are displayed)  Labs Reviewed  GLUCOSE, CAPILLARY - Abnormal; Notable for the following:       Result Value   Glucose-Capillary 114 (*)    All other components within normal limits  BASIC METABOLIC PANEL - Abnormal; Notable for the following:    Glucose, Bld 125 (*)    Calcium 8.7 (*)  All other components within normal limits  CBC WITH DIFFERENTIAL/PLATELET - Abnormal; Notable for the following:    WBC 21.4 (*)    RBC 3.22 (*)    Hemoglobin 10.4 (*)    HCT 30.8 (*)    RDW 14.7 (*)    Platelets 110 (*)    Neutro Abs 19.7 (*)    Lymphs Abs 0.9 (*)    All other components within normal limits  SEDIMENTATION RATE - Abnormal; Notable for the following:    Sed Rate 65 (*)    All other components within normal limits  C-REACTIVE PROTEIN  CBG MONITORING, ED   ____________________________________________  EKG  ED ECG REPORT I, Arta Silence, the attending physician, personally viewed and interpreted this ECG.  Date: 12/14/2016 EKG Time: 1849 Rate: 77 Rhythm: normal sinus rhythm QRS Axis: normal Intervals: normal ST/T Wave abnormalities: normal Narrative Interpretation: no evidence of acute ischemia; normal EKG  ____________________________________________  RADIOLOGY  CT head with no acute findings.    ____________________________________________   PROCEDURES  Procedure(s) performed: No    Critical Care performed:  No ____________________________________________   INITIAL IMPRESSION / ASSESSMENT AND PLAN / ED COURSE  Pertinent labs & imaging results that were available during my care of the patient were reviewed by me and considered in my medical decision making (see chart for details).  62 year old female on chemotherapy for breast cancer with last treatment 11 days ago presents with 2 primary complaints. Patient reports right-sided facial numbness associated with pain behind the right eye and in the right temple for approximately 11 hours. She also reports right arm rash over the last several days. In the ED, vital signs are normal except for borderline temperature (although reviewing prior charts this appears to be baseline for patient), patient is relatively well-appearing, and the exam is as described. Besides the subjective facial numbness there are no other neurologic deficits.  1. Facial numbness: susp most likely neuropathy related to chemo.  No other focal neuro deficits.  Does not fit stroke distribution.  Very low susp for CVA acutely.  Given duration of sx will obtain CT head to r/o.  Also consider temporal arteritis, vs migraine.  Will obtain ESR and CRP and consider steroid tx if elevated.  Eye exam is unremarkable and there is no evidence of primary ocular cause.   2. R arm rash: most c/w cellulitis.  Also consider chemo-related rash, and also possible vasculitis.  I saw pt a few weeks ago and admitted her here with abd pain and CT showing aortitis, and based on her other notes there is some concern she could have a vasculitis and is being evaluated by rheum.  Plan for empiric tx with abx.   Likely d/c home if neg CT head and no alarming findings on workup.     ----------------------------------------- 9:51 PM on 12/14/2016 -----------------------------------------  CT head is negative for acute findings. Patient continues to have normal exam with no facial droop or other motor findings  and subjective numbness as well as the pain. Given time course of the sx with negative CT, as well as isolated sx, presence of pain, and lack of motor findings, there is no evidence of CVA or indication for further imaging.  Lab work reveals elevated ESR, as well as elevated white blood cell count. Given the elevated ESR I will treat empirically for temporal arteritis with PO  prednisone. Patient's visual acuity is grossly intact, able to count fingers.    WBC elevation likely due to R  arm cellulitis.  Pt has no other focal sx to suggest other source of an infection.    The patient feels well and would like to go home. We will d/c with rx for prednisone and keflex.  Pt instructed to f/u with her PMD this week.  Return precautions given.   ____________________________________________   FINAL CLINICAL IMPRESSION(S) / ED DIAGNOSES  Final diagnoses:  Right facial numbness  Temporal pain  Right arm cellulitis      NEW MEDICATIONS STARTED DURING THIS VISIT:  New Prescriptions   No medications on file     Note:  This document was prepared using Dragon voice recognition software and may include unintentional dictation errors.    Arta Silence, MD 12/14/16 2154    Arta Silence, MD 12/14/16 2200

## 2016-12-14 NOTE — ED Triage Notes (Signed)
Pt reports that when she woke up this am at 0730 she noticed that the rt side of her face felt numb, pt also states for the past couple days she has had a rash to her arms, pt also states a pinching pain over her rt eye, pt's last chemo treatment was sept 19

## 2016-12-14 NOTE — ED Notes (Signed)
States right side of eye blurry vision.

## 2016-12-15 LAB — C-REACTIVE PROTEIN: CRP: <0.8 mg/dL (ref <1.0)

## 2016-12-18 DIAGNOSIS — R7982 Elevated C-reactive protein (CRP): Secondary | ICD-10-CM | POA: Insufficient documentation

## 2016-12-18 DIAGNOSIS — R700 Elevated erythrocyte sedimentation rate: Secondary | ICD-10-CM | POA: Insufficient documentation

## 2016-12-21 NOTE — Progress Notes (Signed)
[place]  [phone] [phone]  ID: Barry [place]verhouse OB: [birth year]  MR#: [id number]  TIR#:[id number]  Patient Care Team: Denton Lank, MD as PCP - General (Family Medicine) Rico Junker, RN as Registered Nurse Theodore Demark, RN as Registered Nurse Bary Castilla Forest Gleason, MD (General Surgery)  CHIEF COMPLAINT: Pathologic stage IA ER positive, PR/HER-2 negative, invasive carcinoma of the upper outer quadrant of the right breast. High risk MammaPrint.  INTERVAL HISTORY: Patient returns to clinic today for further evaluation and consideration of cycle 4 of Taxotere and Cytoxan. She was recently evaluated in the emergency room with headache and right arm pain. She had no evidence of CVA. She is also complaining of a rash on her right arm as well as watery eyes. She continues to have weakness and fatigue. She continues to be anxious. She has no neurologic complaints today. She has a good appetite and denies weight loss. She has no chest pain or shortness of breath. She denies any nausea, vomiting, constipation, or diarrhea. She has no urinary complaints. Patient offers no further specific complaints today.  REVIEW OF SYSTEMS:   Review of Systems  Constitutional: Positive for malaise/fatigue and weight loss. Negative for fever.  Respiratory: Negative.  Negative for cough and shortness of breath.   Cardiovascular: Negative.  Negative for chest pain and leg swelling.  Gastrointestinal: Negative.  Negative for abdominal pain, nausea and vomiting.  Genitourinary: Negative.   Musculoskeletal: Negative.   Skin: Negative.  Negative for rash.  Neurological: Positive for weakness.  Psychiatric/Behavioral: The patient is nervous/anxious.     As per HPI. Otherwise, a complete review of systems is negative.  PAST MEDICAL HISTORY: Past Medical History:  Diagnosis Date  . Anxiety   . Breast cancer of upper-outer quadrant of right female breast ([place]) 08/2016     1.0 cm ER+, PR-, Her 2 neu not overexpressed. Node negative. T1b, N0.  [name] Cancer (HCC)    cervical  . Hypertension   . Hypothyroidism   . Thyroid disease     PAST SURGICAL HISTORY: Past Surgical History:  Procedure Laterality Date  . ABDOMINAL HYSTERECTOMY    . BREAST LUMPECTOMY Right 08/28/2016  . BREAST LUMPECTOMY WITH SENTINEL LYMPH NODE BIOPSY Right 08/28/2016   Procedure: BREAST LUMPECTOMY WITH SENTINEL LYMPH NODE BX;  Surgeon: [name], MD;  Location: [facility] ORS;  Service: General;  Laterality: Right;  . BREAST MAMMOSITE Right 09/15/2016   Procedure: MAMMOSITE BREAST;  Surgeon: [name], MD;  Location: [facility] ORS;  Service: General;  Laterality: Right;  . BREAST SURGERY Right    Breast Biopsy  . CERVIX SURGERY    . COLONOSCOPY  2008  . EYE SURGERY Bilateral    Cataract Extraction with IOL  . PORTA CATH INSERTION Right 11/20/2016   Procedure: PORTA CATH INSERTION;  Surgeon: [name], MD;  Location: [facility] ORS;  Service: General;  Laterality: Right;  . SHOULDER ARTHROSCOPY Right     FAMILY HISTORY: Family History  Problem Relation Age of Onset  . Breast cancer Neg Hx     ADVANCED DIRECTIVES (Y/N):  N  HEALTH MAINTENANCE: Social History  Substance Use Topics  . Smoking status: Never Smoker  . Smokeless tobacco: Never Used  . Alcohol use No     Colonoscopy:  PAP:  Bone density:  Lipid panel:  No Known Allergies  Current Outpatient Prescriptions  Medication Sig Dispense Refill  . acyclovir (ZOVIRAX) 400 MG tablet Take 400 mg by mouth 3 (three)  times daily as needed (cold sore outbreaks).     . cephALEXin (KEFLEX) 500 MG capsule Take by mouth.    . cetaphil (CETAPHIL) cream Apply 1 application topically as needed (dry skin).    [name] docusate sodium (COLACE) 100 MG capsule Take 100 mg by mouth 2 (two) times daily.    [name] levothyroxine (SYNTHROID, LEVOTHROID) 100 MCG tablet Take 100 mcg by mouth daily before breakfast.    . lisinopril  (PRINIVIL,ZESTRIL) 10 MG tablet Take 10 mg by mouth daily.    [name] loratadine (CLARITIN) 10 MG tablet Take 10 mg by mouth as directed. Take 10 mg the day before, of, and after chemo    . Multiple Vitamin (MULTIVITAMIN WITH MINERALS) TABS tablet Take 1 tablet by mouth daily.    . predniSONE (DELTASONE) 20 MG tablet Take 3 tablets (60 mg total) by mouth daily. 42 tablet 0  . acetaminophen (TYLENOL) 500 MG tablet Take 1,000 mg by mouth every 6 (six) hours as needed (for pain.).    [name] loperamide (IMODIUM A-D) 2 MG tablet Take 1 tablet (2 mg total) by mouth 4 (four) times daily as needed for diarrhea or loose stools. (Patient not taking: Reported on 12/24/2016) 30 tablet 0  . ondansetron (ZOFRAN) 4 MG tablet Take 1 tablet (4 mg total) by mouth every 8 (eight) hours as needed for nausea or vomiting. (Patient not taking: Reported on 12/24/2016) 60 tablet 2   No current facility-administered medications for this visit.     OBJECTIVE: Vitals:   12/24/16 1034  BP: 115/68  Pulse: 83  Resp: 18  Temp: 97.8 F (36.6 C)     Body mass index is 30.51 kg/m.    ECOG FS:0 - Asymptomatic  General: Well-developed, well-nourished, no acute distress. Eyes: Pink conjunctiva, anicteric sclera. Breasts: Exam deferred today. Lungs: Clear to auscultation bilaterally. Heart: Regular rate and rhythm. No rubs, murmurs, or gallops. Abdomen: Soft, nontender, nondistended. No organomegaly noted, normoactive bowel sounds. Musculoskeletal: No edema, cyanosis, or clubbing. Neuro: Alert, answering all questions appropriately. Cranial nerves grossly intact. Skin: No rashes or petechiae noted. Psych: Normal affect.  LAB RESULTS:  Lab Results  Component Value Date   NA 135 12/24/2016   K 3.9 12/24/2016   CL 102 12/24/2016   CO2 24 12/24/2016   GLUCOSE 167 (H) 12/24/2016   BUN 9 12/24/2016   CREATININE 0.48 12/24/2016   CALCIUM 8.7 (L) 12/24/2016   PROT 6.5 12/24/2016   ALBUMIN 3.2 (L) 12/24/2016   AST 32 12/24/2016    ALT 16 12/24/2016   ALKPHOS 98 12/24/2016   BILITOT 0.7 12/24/2016   GFRNONAA >60 12/24/2016   GFRAA >60 12/24/2016    Lab Results  Component Value Date   WBC 3.1 (L) 12/24/2016   NEUTROABS 1.9 12/24/2016   HGB 11.1 (L) 12/24/2016   HCT 32.9 (L) 12/24/2016   MCV 98.2 12/24/2016   PLT 127 (L) 12/24/2016     STUDIES: Dg Chest 2 View  Result Date: 11/27/2016 CLINICAL DATA:  Pt has right side abd pain for 4 days. Pt also has a fever for days. No v/d Pt also reports back pain. Pt alert. Interpreter with pt. Hx breast cancer pt Last chemo was end of August, history of hypertension, hypothyroidism EXAM: CHEST  2 VIEW COMPARISON:  11/20/2016 FINDINGS: The cardiac silhouette is normal in size and configuration. No mediastinal or hilar masses. No evidence of adenopathy. Clear lungs.  No pleural effusion or pneumothorax. Right anterior chest wall, internal jugular Port-A-Cath is stable.  Skeletal structures are intact. IMPRESSION: No active cardiopulmonary disease. Electronically Signed   By: Lajean Manes M.D.   On: 11/27/2016 17:50   Ct Head Wo Contrast  Result Date: 12/14/2016 CLINICAL DATA:  Woke up this morning with right facial numbness and rash over upper extremities past couple days. Right orbital pain. Chemotherapy for breast cancer September 19th. EXAM: CT HEAD WITHOUT CONTRAST TECHNIQUE: Contiguous axial images were obtained from the base of the skull through the vertex without intravenous contrast. COMPARISON:  None. FINDINGS: Brain: No evidence of acute infarction, hemorrhage, hydrocephalus, extra-axial collection or mass lesion/mass effect. Vascular: No hyperdense vessel or unexpected calcification. Skull: Normal. Negative for fracture or focal lesion. Sinuses/Orbits: No acute finding. Other: None. IMPRESSION: No acute intracranial findings. Electronically Signed   By: Marin Olp M.D.   On: 12/14/2016 20:16   Ct Abdomen Pelvis W Contrast  Result Date: 11/27/2016 CLINICAL DATA:   RIGHT upper quadrant pain for 4 days, fever, back pain. History of breast and cervical cancer. EXAM: CT ABDOMEN AND PELVIS WITH CONTRAST TECHNIQUE: Multidetector CT imaging of the abdomen and pelvis was performed using the standard protocol following bolus administration of intravenous contrast. CONTRAST:  185m ISOVUE-300 IOPAMIDOL (ISOVUE-300) INJECTION 61% COMPARISON:  CT abdomen and pelvis October 28, 2016 abdominal ultrasound November 27, 2016 FINDINGS: LOWER CHEST dependent atelectasis. Included heart size is normal. No pericardial effusion. HEPATOBILIARY: Nodular liver contour without solid mass. subcentimeter hypodensity in dome of the liver. Stable 2.2 cm cyst in LEFT lobe of the liver. PANCREAS: Normal. SPLEEN: Normal. ADRENALS/URINARY TRACT: Kidneys are orthotopic, demonstrating symmetric enhancement. No nephrolithiasis, or solid renal masses. Multifocal RIGHT renal scarring and mild atrophy and mild chronic hydronephrosis. Too small to characterize hypodensity lower pole RIGHT kidney. The unopacified ureters are normal in course and caliber. Delayed imaging through the kidneys demonstrates symmetric prompt contrast excretion within the proximal urinary collecting system. Urinary bladder is well distended and unremarkable. Normal adrenal glands. STOMACH/BOWEL: The stomach, small and large bowel are normal in course and caliber without inflammatory changes. Mild colonic diverticulosis. Normal appendix. VASCULAR/LYMPHATIC: Aortoiliac vessels are normal in course and caliber. New irregular soft tissue about the distal thoracic and proximal suprarenal abdominal aorta with periaortic fat stranding. Mild calcific atherosclerosis. No lymphadenopathy by CT size criteria. Stable subcentimeter retroperitoneal lymph nodes. Status post pelvic side wall nodal dissection. REPRODUCTIVE: Status post hysterectomy. OTHER: No intraperitoneal free fluid or free air. MUSCULOSKELETAL: Nonacute. Grade 1 L4-5 anterolisthesis  without spondylolysis. Anterior abdominal wall scarring. Moderate L2-3 disc osteophyte complex. IMPRESSION: 1. 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 Superior extent of involvement incompletely imaged. 2. 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 3. Mildly atrophic RIGHT kidney with mild chronic hydronephrosis. 4.  Aortic Atherosclerosis (ICD10-I70.0). Acute findings discussed with and [facility] by [facility] on 11/27/2016 at 7:23 pm. Electronically Signed   By: CElon AlasM.D.   On: 11/27/2016 19:29   UKoreaAbdomen Limited Ruq  Result Date: 11/27/2016 CLINICAL DATA:  Right upper quadrant pain. EXAM: ULTRASOUND ABDOMEN LIMITED RIGHT UPPER QUADRANT COMPARISON:  Abdominal CT 10/28/2016 FINDINGS: Gallbladder: No gallstones or wall thickening visualized. No sonographic Murphy sign noted by sonographer. Common bile duct: Diameter: 3 mm Liver: Simple 25 mm left hepatic cyst. There is heterogeneous echotexture this patient with cirrhotic changes on CT. Patent antegrade flow in the imaged hepatic and portal venous system. Moderate right hydronephrosis. A right ureteral jet was not visualized. IMPRESSION: 1. Right hydronephrosis. The bladder was scanned and a ureteral jet was never seen  on the right. 2. Negative gallbladder. 3. Heterogeneous liver correlating with cirrhotic morphology on recent CT. Electronically Signed   By: Monte Fantasia M.D.   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 High risk MammaPrint.  PLAN:    1. 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 High risk MammaPrint. Patient had lumpectomy on August 28, 2016 followed by MammoSite radiation therapy. Given her high risk MammaPrint, have recommended adjuvant chemotherapy. Proceed with cycle 4 of 4 of Taxotere and  Cytoxan. She will require Neulasta support. Patient now has a port.  At the conclusion of all her treatments, patient will benefit from an aromatase inhibitor given the ER status of her tumor. Return in 4 weeks for further evaluation and initiation of an aromatase inhibitor.  2. Thrombocytopenia: Mild, monitor. 3. Aoritis: Patient was previously given a referral to rheumatology. 4. Rash: Continue topical corticosteroids as needed. 5. Watery eyes: Recommended OTC allergy medications. 6. Headache: CT scan of the head on December 14, 2016 was reported negative for any pathology or metastatic disease.  Approximately 30 minutes was spent in discussion of which greater than 50% was consultation.  Interpreter was present throughout the entire clinic visit.  Patient expressed understanding and was in agreement with this plan. She also understands that She can call clinic at any time with any questions, concerns, or complaints.   Cancer Staging Malignant neoplasm of upper-outer quadrant of right breast in female, estrogen receptor positive ([place]) Staging form: Breast, AJCC 8th Edition - Pathologic stage from 10/01/2016: Stage IA (pT1b, pN0, cM0, G2, ER: Positive, PR: Negative, HER2: Negative) - Signed by Lloyd Huger, MD on 10/01/2016   Lloyd Huger, MD   12/27/2016 7:15 AM

## 2016-12-22 ENCOUNTER — Other Ambulatory Visit: Payer: Self-pay | Admitting: Internal Medicine

## 2016-12-22 DIAGNOSIS — R1011 Right upper quadrant pain: Secondary | ICD-10-CM

## 2016-12-22 DIAGNOSIS — R700 Elevated erythrocyte sedimentation rate: Secondary | ICD-10-CM

## 2016-12-22 DIAGNOSIS — C50411 Malignant neoplasm of upper-outer quadrant of right female breast: Secondary | ICD-10-CM

## 2016-12-22 DIAGNOSIS — M314 Aortic arch syndrome [Takayasu]: Secondary | ICD-10-CM

## 2016-12-24 ENCOUNTER — Inpatient Hospital Stay: Payer: Medicaid Other

## 2016-12-24 ENCOUNTER — Inpatient Hospital Stay: Payer: Medicaid Other | Attending: Oncology | Admitting: Oncology

## 2016-12-24 VITALS — BP 115/68 | HR 83 | Temp 97.8°F | Resp 18 | Wt 156.2 lb

## 2016-12-24 DIAGNOSIS — R21 Rash and other nonspecific skin eruption: Secondary | ICD-10-CM | POA: Diagnosis not present

## 2016-12-24 DIAGNOSIS — R5381 Other malaise: Secondary | ICD-10-CM | POA: Diagnosis not present

## 2016-12-24 DIAGNOSIS — Z5111 Encounter for antineoplastic chemotherapy: Secondary | ICD-10-CM | POA: Diagnosis not present

## 2016-12-24 DIAGNOSIS — E039 Hypothyroidism, unspecified: Secondary | ICD-10-CM | POA: Insufficient documentation

## 2016-12-24 DIAGNOSIS — R5383 Other fatigue: Secondary | ICD-10-CM

## 2016-12-24 DIAGNOSIS — C50411 Malignant neoplasm of upper-outer quadrant of right female breast: Secondary | ICD-10-CM

## 2016-12-24 DIAGNOSIS — R51 Headache: Secondary | ICD-10-CM | POA: Diagnosis not present

## 2016-12-24 DIAGNOSIS — Z170 Estrogen receptor positive status [ER+]: Secondary | ICD-10-CM

## 2016-12-24 DIAGNOSIS — D696 Thrombocytopenia, unspecified: Secondary | ICD-10-CM | POA: Diagnosis not present

## 2016-12-24 DIAGNOSIS — F419 Anxiety disorder, unspecified: Secondary | ICD-10-CM | POA: Diagnosis not present

## 2016-12-24 DIAGNOSIS — Z7952 Long term (current) use of systemic steroids: Secondary | ICD-10-CM | POA: Insufficient documentation

## 2016-12-24 DIAGNOSIS — Z79899 Other long term (current) drug therapy: Secondary | ICD-10-CM | POA: Diagnosis not present

## 2016-12-24 DIAGNOSIS — Z7689 Persons encountering health services in other specified circumstances: Secondary | ICD-10-CM | POA: Insufficient documentation

## 2016-12-24 DIAGNOSIS — I10 Essential (primary) hypertension: Secondary | ICD-10-CM | POA: Diagnosis not present

## 2016-12-24 DIAGNOSIS — R531 Weakness: Secondary | ICD-10-CM | POA: Insufficient documentation

## 2016-12-24 LAB — CBC WITH DIFFERENTIAL/PLATELET
Basophils Absolute: 0 10*3/uL (ref 0–0.1)
Basophils Relative: 1 %
Eosinophils Absolute: 0 10*3/uL (ref 0–0.7)
Eosinophils Relative: 0 %
HCT: 32.9 % — ABNORMAL LOW (ref 35.0–47.0)
Hemoglobin: 11.1 g/dL — ABNORMAL LOW (ref 12.0–16.0)
Lymphocytes Relative: 23 %
Lymphs Abs: 0.7 10*3/uL — ABNORMAL LOW (ref 1.0–3.6)
MCH: 33 pg (ref 26.0–34.0)
MCHC: 33.6 g/dL (ref 32.0–36.0)
MCV: 98.2 fL (ref 80.0–100.0)
Monocytes Absolute: 0.4 10*3/uL (ref 0.2–0.9)
Monocytes Relative: 15 %
Neutro Abs: 1.9 10*3/uL (ref 1.4–6.5)
Neutrophils Relative %: 61 %
Platelets: 127 10*3/uL — ABNORMAL LOW (ref 150–440)
RBC: 3.35 MIL/uL — ABNORMAL LOW (ref 3.80–5.20)
RDW: 16.1 % — ABNORMAL HIGH (ref 11.5–14.5)
WBC: 3.1 10*3/uL — ABNORMAL LOW (ref 3.6–11.0)

## 2016-12-24 LAB — COMPREHENSIVE METABOLIC PANEL
ALT: 16 U/L (ref 14–54)
AST: 32 U/L (ref 15–41)
Albumin: 3.2 g/dL — ABNORMAL LOW (ref 3.5–5.0)
Alkaline Phosphatase: 98 U/L (ref 38–126)
Anion gap: 9 (ref 5–15)
BUN: 9 mg/dL (ref 6–20)
CO2: 24 mmol/L (ref 22–32)
Calcium: 8.7 mg/dL — ABNORMAL LOW (ref 8.9–10.3)
Chloride: 102 mmol/L (ref 101–111)
Creatinine, Ser: 0.48 mg/dL (ref 0.44–1.00)
GFR calc Af Amer: >60 mL/min (ref >60)
GFR calc non Af Amer: >60 mL/min (ref >60)
Glucose, Bld: 167 mg/dL — ABNORMAL HIGH (ref 65–99)
Potassium: 3.9 mmol/L (ref 3.5–5.1)
Sodium: 135 mmol/L (ref 135–145)
Total Bilirubin: 0.7 mg/dL (ref 0.3–1.2)
Total Protein: 6.5 g/dL (ref 6.5–8.1)

## 2016-12-24 MED ORDER — SODIUM CHLORIDE 0.9 % IV SOLN
1000.0000 mg | Freq: Once | INTRAVENOUS | Status: AC
  Administered 2016-12-24: 1000 mg via INTRAVENOUS
  Filled 2016-12-24: qty 50

## 2016-12-24 MED ORDER — HEPARIN SOD (PORK) LOCK FLUSH 100 UNIT/ML IV SOLN
500.0000 [IU] | Freq: Once | INTRAVENOUS | Status: AC
  Administered 2016-12-24: 500 [IU] via INTRAVENOUS
  Filled 2016-12-24: qty 5

## 2016-12-24 MED ORDER — SODIUM CHLORIDE 0.9% FLUSH
10.0000 mL | INTRAVENOUS | Status: DC | PRN
  Administered 2016-12-24: 10 mL via INTRAVENOUS
  Filled 2016-12-24: qty 10

## 2016-12-24 MED ORDER — PEGFILGRASTIM 6 MG/0.6ML ~~LOC~~ PSKT
6.0000 mg | PREFILLED_SYRINGE | Freq: Once | SUBCUTANEOUS | Status: AC
  Administered 2016-12-24: 6 mg via SUBCUTANEOUS
  Filled 2016-12-24: qty 0.6

## 2016-12-24 MED ORDER — DOCETAXEL CHEMO INJECTION 160 MG/16ML
75.0000 mg/m2 | Freq: Once | INTRAVENOUS | Status: AC
  Administered 2016-12-24: 130 mg via INTRAVENOUS
  Filled 2016-12-24: qty 13

## 2016-12-24 MED ORDER — PALONOSETRON HCL INJECTION 0.25 MG/5ML
0.2500 mg | Freq: Once | INTRAVENOUS | Status: AC
Start: 2016-12-24 — End: 2016-12-24
  Administered 2016-12-24: 0.25 mg via INTRAVENOUS
  Filled 2016-12-24: qty 5

## 2016-12-24 MED ORDER — DEXAMETHASONE SODIUM PHOSPHATE 10 MG/ML IJ SOLN
10.0000 mg | Freq: Once | INTRAMUSCULAR | Status: AC
  Administered 2016-12-24: 10 mg via INTRAVENOUS
  Filled 2016-12-24: qty 1

## 2016-12-24 MED ORDER — SODIUM CHLORIDE 0.9 % IV SOLN
Freq: Once | INTRAVENOUS | Status: AC
  Administered 2016-12-24: 12:00:00 via INTRAVENOUS
  Filled 2016-12-24: qty 1000

## 2016-12-24 NOTE — Progress Notes (Signed)
Per pt here w family and interpretor [place]. Pt to  on 09/30 -related to arm pain -right ,and frontal lobe headache. Testing showed negative results and d/c on Prednisone . Referred to eye Dr and findings also negative . Today continues to c/o headache (7 level ) and watery eyes. Also c/o fingers feel tingly /numbness and feels like fingernails may fall off.

## 2016-12-29 ENCOUNTER — Emergency Department
Admission: EM | Admit: 2016-12-29 | Discharge: 2016-12-29 | Disposition: A | Discharge status: Home / Self Care | Payer: Medicaid Other | Attending: Emergency Medicine | Admitting: Emergency Medicine

## 2016-12-29 ENCOUNTER — Emergency Department: Payer: Medicaid Other

## 2016-12-29 ENCOUNTER — Other Ambulatory Visit: Payer: Self-pay

## 2016-12-29 DIAGNOSIS — E039 Hypothyroidism, unspecified: Secondary | ICD-10-CM | POA: Insufficient documentation

## 2016-12-29 DIAGNOSIS — I10 Essential (primary) hypertension: Secondary | ICD-10-CM | POA: Insufficient documentation

## 2016-12-29 DIAGNOSIS — Z79899 Other long term (current) drug therapy: Secondary | ICD-10-CM | POA: Diagnosis not present

## 2016-12-29 DIAGNOSIS — R079 Chest pain, unspecified: Secondary | ICD-10-CM | POA: Insufficient documentation

## 2016-12-29 DIAGNOSIS — T451X5A Adverse effect of antineoplastic and immunosuppressive drugs, initial encounter: Secondary | ICD-10-CM

## 2016-12-29 DIAGNOSIS — R6883 Chills (without fever): Secondary | ICD-10-CM | POA: Insufficient documentation

## 2016-12-29 DIAGNOSIS — Z853 Personal history of malignant neoplasm of breast: Secondary | ICD-10-CM | POA: Insufficient documentation

## 2016-12-29 DIAGNOSIS — M791 Myalgia, unspecified site: Secondary | ICD-10-CM | POA: Diagnosis not present

## 2016-12-29 DIAGNOSIS — T451X1A Poisoning by antineoplastic and immunosuppressive drugs, accidental (unintentional), initial encounter: Secondary | ICD-10-CM | POA: Diagnosis not present

## 2016-12-29 LAB — COMPREHENSIVE METABOLIC PANEL
ALT: 15 U/L (ref 14–54)
AST: 37 U/L (ref 15–41)
Albumin: 3 g/dL — ABNORMAL LOW (ref 3.5–5.0)
Alkaline Phosphatase: 92 U/L (ref 38–126)
Anion gap: 8 (ref 5–15)
BUN: 7 mg/dL (ref 6–20)
CO2: 24 mmol/L (ref 22–32)
Calcium: 8.4 mg/dL — ABNORMAL LOW (ref 8.9–10.3)
Chloride: 100 mmol/L — ABNORMAL LOW (ref 101–111)
Creatinine, Ser: 0.49 mg/dL (ref 0.44–1.00)
GFR calc Af Amer: >60 mL/min (ref >60)
GFR calc non Af Amer: >60 mL/min (ref >60)
Glucose, Bld: 157 mg/dL — ABNORMAL HIGH (ref 65–99)
Potassium: 3.2 mmol/L — ABNORMAL LOW (ref 3.5–5.1)
Sodium: 132 mmol/L — ABNORMAL LOW (ref 135–145)
Total Bilirubin: 1 mg/dL (ref 0.3–1.2)
Total Protein: 6.1 g/dL — ABNORMAL LOW (ref 6.5–8.1)

## 2016-12-29 LAB — CBC WITH DIFFERENTIAL/PLATELET
Basophils Absolute: 0 10*3/uL (ref 0–0.1)
Basophils Relative: 1 %
Eosinophils Absolute: 0 10*3/uL (ref 0–0.7)
Eosinophils Relative: 1 %
HCT: 30.2 % — ABNORMAL LOW (ref 35.0–47.0)
Hemoglobin: 10 g/dL — ABNORMAL LOW (ref 12.0–16.0)
Lymphocytes Relative: 21 %
Lymphs Abs: 0.3 10*3/uL — ABNORMAL LOW (ref 1.0–3.6)
MCH: 32.6 pg (ref 26.0–34.0)
MCHC: 33.2 g/dL (ref 32.0–36.0)
MCV: 98.1 fL (ref 80.0–100.0)
Monocytes Absolute: 0.1 10*3/uL — ABNORMAL LOW (ref 0.2–0.9)
Monocytes Relative: 5 %
Neutro Abs: 0.8 10*3/uL — ABNORMAL LOW (ref 1.4–6.5)
Neutrophils Relative %: 72 %
Platelets: 59 10*3/uL — ABNORMAL LOW (ref 150–440)
RBC: 3.08 MIL/uL — ABNORMAL LOW (ref 3.80–5.20)
RDW: 16.4 % — ABNORMAL HIGH (ref 11.5–14.5)
WBC: 1.2 10*3/uL — CL (ref 3.6–11.0)

## 2016-12-29 LAB — URINALYSIS, COMPLETE (UACMP) WITH MICROSCOPIC
Bacteria, UA: NONE SEEN
Bilirubin Urine: NEGATIVE
Glucose, UA: NEGATIVE mg/dL
Hgb urine dipstick: NEGATIVE
Ketones, ur: NEGATIVE mg/dL
Leukocytes, UA: NEGATIVE
Nitrite: NEGATIVE
Protein, ur: NEGATIVE mg/dL
Specific Gravity, Urine: 1.002 — ABNORMAL LOW (ref 1.005–1.030)
pH: 7 (ref 5.0–8.0)

## 2016-12-29 LAB — LACTIC ACID, PLASMA: Lactic Acid, Venous: 2.1 mmol/L (ref 0.5–1.9)

## 2016-12-29 LAB — PROTIME-INR
INR: 1.04
Prothrombin Time: 13.5 seconds (ref 11.4–15.2)

## 2016-12-29 MED ORDER — OXYCODONE-ACETAMINOPHEN 7.5-325 MG PO TABS: 1.0000 | ORAL_TABLET | Freq: Four times a day (QID) | ORAL | 0 refills | Status: DC | PRN

## 2016-12-29 MED ORDER — ONDANSETRON HCL 4 MG/2ML IJ SOLN
4.0000 mg | Freq: Once | INTRAMUSCULAR | Status: AC
  Administered 2016-12-29: 4 mg via INTRAVENOUS
  Filled 2016-12-29: qty 2

## 2016-12-29 MED ORDER — SODIUM CHLORIDE 0.9 % IV BOLUS (SEPSIS)
1000.0000 mL | Freq: Once | INTRAVENOUS | Status: AC
Start: 2016-12-29 — End: 2016-12-29
  Administered 2016-12-29: 1000 mL via INTRAVENOUS

## 2016-12-29 MED ORDER — MORPHINE SULFATE (PF) 4 MG/ML IV SOLN
4.0000 mg | Freq: Once | INTRAVENOUS | Status: AC
  Administered 2016-12-29: 4 mg via INTRAVENOUS
  Filled 2016-12-29: qty 1

## 2016-12-29 NOTE — ED Notes (Signed)
Pt presents with generalized body aches that are so severe she can't sleep. She states that she has had similar issues with each chemo treatment, and the pain is intolerable. Pt alert & oriented; tearful during assessment.

## 2016-12-29 NOTE — ED Triage Notes (Signed)
Pt reports body aches, chills, chest pain last night. Pt receiving chemo, last treatment on 10/10. Took tylenol PTA.

## 2016-12-29 NOTE — ED Provider Notes (Signed)
[facility] Emergency Department Provider Note   ____________________________________________    I have reviewed the triage vital signs and the nursing notes.   HISTORY  Chief Complaint Chills; Generalized Body Aches; and Chest Pain  Spanish interpreter used   HPI Wanda Reed is a 61 y.o. female Who presents with complaints of "hurting all over ". Patient had chemotherapy for breast cancer on the 10th, she complains of severe aching all over her body including her joints and she "can't take it ". Dr. Grayland Ormond is her oncologist, she notes this is happened after prior episodes of chemotherapy as well. Review of records and x-rays the patient was diagnosed with aortitis and was admitted late last month. She reports she does not have significant chest pain today. She denies fevers to me. No nausea or vomiting. No cough. No dysuria   Past Medical History:  Diagnosis Date  . Anxiety   . Breast cancer of upper-outer quadrant of right female breast ([place]) 08/2016   1.0 cm ER+, PR-, Her 2 neu not overexpressed. Node negative. T1b, N0.  [name] Cancer (HCC)    cervical  . Hypertension   . Hypothyroidism   . Thyroid disease     Patient Active Problem List   Diagnosis Date Noted  . Vomiting and diarrhea 12/10/2016  . Aortitis ([place]) 11/27/2016  . Sepsis ([place]) 10/26/2016  . Hyperkalemia 08/28/2016  . Wide-complex tachycardia ([place]) 08/28/2016  . Malignant neoplasm of upper-outer quadrant of right breast in female, estrogen receptor positive ([place]) 08/18/2016    Past Surgical History:  Procedure Laterality Date  . ABDOMINAL HYSTERECTOMY    . BREAST LUMPECTOMY Right 08/28/2016  . BREAST LUMPECTOMY WITH SENTINEL LYMPH NODE BIOPSY Right 08/28/2016   Procedure: BREAST LUMPECTOMY WITH SENTINEL LYMPH NODE BX;  Surgeon: Cherye Gaertner Bellow, MD;  Location: [facility] ORS;  Service: General;  Laterality: Right;  . BREAST MAMMOSITE Right 09/15/2016   Procedure: MAMMOSITE  BREAST;  Surgeon: Tyliyah Mcmeekin Bellow, MD;  Location: [facility] ORS;  Service: General;  Laterality: Right;  . BREAST SURGERY Right    Breast Biopsy  . CERVIX SURGERY    . COLONOSCOPY  2008  . EYE SURGERY Bilateral    Cataract Extraction with IOL  . PORTA CATH INSERTION Right 11/20/2016   Procedure: PORTA CATH INSERTION;  Surgeon: Tali Coster Bellow, MD;  Location: [facility] ORS;  Service: General;  Laterality: Right;  . SHOULDER ARTHROSCOPY Right     Prior to Admission medications   Medication Sig Start Date End Date Taking? Authorizing Provider  acetaminophen (TYLENOL) 500 MG tablet Take 1,000 mg by mouth every 6 (six) hours as needed (for pain.).    [provider]  acyclovir (ZOVIRAX) 400 MG tablet Take 400 mg by mouth 3 (three) times daily as needed (cold sore outbreaks).     [provider]  cephALEXin (KEFLEX) 500 MG capsule Take by mouth.    [provider]  cetaphil (CETAPHIL) cream Apply 1 application topically as needed (dry skin).    [provider]  docusate sodium (COLACE) 100 MG capsule Take 100 mg by mouth 2 (two) times daily.    [provider]  levothyroxine (SYNTHROID, LEVOTHROID) 100 MCG tablet Take 100 mcg by mouth daily before breakfast.    [provider]  lisinopril (PRINIVIL,ZESTRIL) 10 MG tablet Take 10 mg by mouth daily.    [provider]  loperamide (IMODIUM A-D) 2 MG tablet Take 1 tablet (2 mg total) by mouth 4 (four) times  daily as needed for diarrhea or loose stools. Patient not taking: Reported on 12/24/2016 12/09/16   Jacquelin Hawking, NP  loratadine (CLARITIN) 10 MG tablet Take 10 mg by mouth as directed. Take 10 mg the day before, of, and after chemo    [provider]  Multiple Vitamin (MULTIVITAMIN WITH MINERALS) TABS tablet Take 1 tablet by mouth daily.    [provider]  ondansetron (ZOFRAN) 4 MG tablet Take 1 tablet (4 mg total) by mouth every 8 (eight) hours as needed for nausea or  vomiting. Patient not taking: Reported on 12/24/2016 12/09/16   Jacquelin Hawking, NP  oxyCODONE-acetaminophen (PERCOCET) 7.5-325 MG tablet Take 1 tablet by mouth every 6 (six) hours as needed for severe pain. 12/29/16 12/29/17  Lavonia Drafts, MD  predniSONE (DELTASONE) 20 MG tablet Take 3 tablets (60 mg total) by mouth daily. 12/15/16 12/29/16  Arta Silence, MD     Allergies Patient has no known allergies.  Family History  Problem Relation Age of Onset  . Breast cancer Neg Hx     Social History Social History  Substance Use Topics  . Smoking status: Never Smoker  . Smokeless tobacco: Never Used  . Alcohol use No    Review of Systems  Constitutional: No fevers Eyes: No visual changes.  ENT: No sore throat. Cardiovascular: Denies chest pain. Respiratory: Denies shortness of breath. Gastrointestinal: No abdominal pain.  No nausea, no vomiting.   Genitourinary: Negative for dysuria. Musculoskeletal: as above Skin: Negative for rash. Neurological: Negative for headaches    ____________________________________________   PHYSICAL EXAM:  VITAL SIGNS: ED Triage Vitals  Enc Vitals Group     BP 12/29/16 1221 (!) 114/51     Pulse Rate 12/29/16 1221 (!) 106     Resp 12/29/16 1221 18     Temp 12/29/16 1221 98.6 F (37 C)     Temp Source 12/29/16 1221 Oral     SpO2 12/29/16 1221 99 %     Weight 12/29/16 1221 70.8 kg (156 lb)     Height 12/29/16 1221 1.524 m (5')     Head Circumference --      Peak Flow --      Pain Score 12/29/16 1220 8     Pain Loc --      Pain Edu? --      Excl. in [place]? --     Constitutional: Alert and oriented.  Pleasant and interactive Eyes: Conjunctivae are normal.   Nose: No congestion/rhinnorhea. Mouth/Throat: Mucous membranes are moist.    Cardiovascular: Normal rate, regular rhythm. Grossly normal heart sounds.  Good peripheral circulation. Respiratory: Normal respiratory effort.  No retractions. Lungs CTAB. Gastrointestinal: Soft  and nontender. No distention.  No CVA tenderness. Genitourinary: deferred Musculoskeletal:  Warm and well perfused, no joint swelling, full range of motion of all extremities Neurologic:  Normal speech and language. No gross focal neurologic deficits are appreciated.  Skin:  Skin is warm, dry and intact. No rash noted. Psychiatric: Mood and affect are normal. Speech and behavior are normal.  ____________________________________________   LABS (all labs ordered are listed, but only abnormal results are displayed)  Labs Reviewed  COMPREHENSIVE METABOLIC PANEL - Abnormal; Notable for the following:       Result Value   Sodium 132 (*)    Potassium 3.2 (*)    Chloride 100 (*)    Glucose, Bld 157 (*)    Calcium 8.4 (*)    Total Protein 6.1 (*)  Albumin 3.0 (*)    All other components within normal limits  LACTIC ACID, PLASMA - Abnormal; Notable for the following:    Lactic Acid, Venous 2.1 (*)    All other components within normal limits  CBC WITH DIFFERENTIAL/PLATELET - Abnormal; Notable for the following:    WBC 1.2 (*)    RBC 3.08 (*)    Hemoglobin 10.0 (*)    HCT 30.2 (*)    RDW 16.4 (*)    Platelets 59 (*)    Neutro Abs 0.8 (*)    Lymphs Abs 0.3 (*)    Monocytes Absolute 0.1 (*)    All other components within normal limits  URINALYSIS, COMPLETE (UACMP) WITH MICROSCOPIC - Abnormal; Notable for the following:    Color, Urine STRAW (*)    APPearance CLEAR (*)    Specific Gravity, Urine 1.002 (*)    Squamous Epithelial / LPF 0-5 (*)    All other components within normal limits  CULTURE, BLOOD (ROUTINE X 2)  CULTURE, BLOOD (ROUTINE X 2)  PROTIME-INR  LACTIC ACID, PLASMA   ____________________________________________  EKG  ED ECG REPORT I, Lavonia Drafts, the attending physician, personally viewed and interpreted this ECG.  Date: 12/29/2016  Rhythm: normal sinus rhythm QRS Axis: normal Intervals: normal ST/T Wave abnormalities: normal Narrative Interpretation:  no evidence of acute ischemia  ____________________________________________  RADIOLOGY  chest x-ray unremarkable ____________________________________________   PROCEDURES  Procedure(s) performed: No    Critical Care performed: No ____________________________________________   INITIAL IMPRESSION / ASSESSMENT AND PLAN / ED COURSE  Pertinent labs & imaging results that were available during my care of the patient were reviewed by me and considered in my medical decision making (see chart for details).  patient presents with diffuse aching as above. This is likely a reaction to chemotherapy as she has had it after prior episodes as well. Reportedly this was her last episode of chemotherapy. We will treat with IV morphine and IV Zofran, IV fluids and reevaluate after lab work has returned.  Lab work is significant for White blood cell count of 1.2, 72% neutrophils. Platelets 59. Not unusual 5 days s/p chemo. Lactic acid is mildly elevated at 2.1 do not feel this represents sepsis as the patient is afebrile with normal cxr, normal ua, no rash.   Discussed with oncology Dr. Grayland Ormond who agrees that this is likely chemo reaction and no need for admission if symptoms improved  ----------------------------------------- 2:39 PM on 12/29/2016 -----------------------------------------  Patient reports pain has completely resolved. She feels comfortable with discharge. She knows she can return at any time    ____________________________________________   FINAL CLINICAL IMPRESSION(S) / ED DIAGNOSES  Final diagnoses:  Chemotherapy adverse reaction, initial encounter      NEW MEDICATIONS STARTED DURING THIS VISIT:  New Prescriptions   OXYCODONE-ACETAMINOPHEN (PERCOCET) 7.5-325 MG TABLET    Take 1 tablet by mouth every 6 (six) hours as needed for severe pain.     Note:  This document was prepared using Dragon voice recognition software and may include unintentional dictation  errors.    Lavonia Drafts, MD 12/29/16 [phone]

## 2016-12-30 ENCOUNTER — Ambulatory Visit
Admission: RE | Admit: 2016-12-30 | Discharge: 2016-12-30 | Disposition: A | Discharge status: Home / Self Care | Payer: Medicaid Other | Source: Ambulatory Visit | Attending: Internal Medicine | Admitting: Internal Medicine

## 2017-01-01 ENCOUNTER — Emergency Department
Admission: EM | Admit: 2017-01-01 | Discharge: 2017-01-02 | Disposition: A | Discharge status: Other Acute Inpatient Hospital | Payer: Medicaid Other | Attending: Emergency Medicine | Admitting: Emergency Medicine

## 2017-01-01 DIAGNOSIS — R110 Nausea: Secondary | ICD-10-CM | POA: Insufficient documentation

## 2017-01-01 DIAGNOSIS — Z170 Estrogen receptor positive status [ER+]: Secondary | ICD-10-CM | POA: Diagnosis not present

## 2017-01-01 DIAGNOSIS — Z8679 Personal history of other diseases of the circulatory system: Secondary | ICD-10-CM | POA: Diagnosis not present

## 2017-01-01 DIAGNOSIS — F419 Anxiety disorder, unspecified: Secondary | ICD-10-CM | POA: Diagnosis not present

## 2017-01-01 DIAGNOSIS — I10 Essential (primary) hypertension: Secondary | ICD-10-CM | POA: Diagnosis not present

## 2017-01-01 DIAGNOSIS — E039 Hypothyroidism, unspecified: Secondary | ICD-10-CM | POA: Insufficient documentation

## 2017-01-01 DIAGNOSIS — L959 Vasculitis limited to the skin, unspecified: Secondary | ICD-10-CM | POA: Diagnosis not present

## 2017-01-01 DIAGNOSIS — I776 Arteritis, unspecified: Secondary | ICD-10-CM

## 2017-01-01 DIAGNOSIS — Z8541 Personal history of malignant neoplasm of cervix uteri: Secondary | ICD-10-CM | POA: Insufficient documentation

## 2017-01-01 DIAGNOSIS — R21 Rash and other nonspecific skin eruption: Secondary | ICD-10-CM | POA: Diagnosis present

## 2017-01-01 DIAGNOSIS — E561 Deficiency of vitamin K: Secondary | ICD-10-CM | POA: Diagnosis not present

## 2017-01-01 DIAGNOSIS — C50411 Malignant neoplasm of upper-outer quadrant of right female breast: Secondary | ICD-10-CM | POA: Insufficient documentation

## 2017-01-01 DIAGNOSIS — Z79899 Other long term (current) drug therapy: Secondary | ICD-10-CM | POA: Insufficient documentation

## 2017-01-01 LAB — CBC WITH DIFFERENTIAL/PLATELET
Band Neutrophils: 10 %
Basophils Absolute: 0 10*3/uL (ref 0–0.1)
Basophils Relative: 0 %
Blasts: 0 %
Eosinophils Absolute: 0 10*3/uL (ref 0–0.7)
Eosinophils Relative: 0 %
HCT: 30.7 % — ABNORMAL LOW (ref 35.0–47.0)
Hemoglobin: 10.3 g/dL — ABNORMAL LOW (ref 12.0–16.0)
Lymphocytes Relative: 6 %
Lymphs Abs: 1.4 10*3/uL (ref 1.0–3.6)
MCH: 32.6 pg (ref 26.0–34.0)
MCHC: 33.4 g/dL (ref 32.0–36.0)
MCV: 97.6 fL (ref 80.0–100.0)
Metamyelocytes Relative: 3 %
Monocytes Absolute: 0.9 10*3/uL (ref 0.2–0.9)
Monocytes Relative: 4 %
Myelocytes: 3 %
Neutro Abs: 20.9 10*3/uL — ABNORMAL HIGH (ref 1.4–6.5)
Neutrophils Relative %: 74 %
Other: 0 %
Platelets: 113 10*3/uL — ABNORMAL LOW (ref 150–440)
Promyelocytes Absolute: 0 %
RBC: 3.15 MIL/uL — ABNORMAL LOW (ref 3.80–5.20)
RDW: 16.3 % — ABNORMAL HIGH (ref 11.5–14.5)
WBC: 23.2 10*3/uL — ABNORMAL HIGH (ref 3.6–11.0)
nRBC: 0 /100 WBC

## 2017-01-01 LAB — COMPREHENSIVE METABOLIC PANEL
ALT: 13 U/L — ABNORMAL LOW (ref 14–54)
AST: 38 U/L (ref 15–41)
Albumin: 3.1 g/dL — ABNORMAL LOW (ref 3.5–5.0)
Alkaline Phosphatase: 104 U/L (ref 38–126)
Anion gap: 9 (ref 5–15)
BUN: 5 mg/dL — ABNORMAL LOW (ref 6–20)
CO2: 28 mmol/L (ref 22–32)
Calcium: 8.9 mg/dL (ref 8.9–10.3)
Chloride: 100 mmol/L — ABNORMAL LOW (ref 101–111)
Creatinine, Ser: 0.55 mg/dL (ref 0.44–1.00)
GFR calc Af Amer: >60 mL/min (ref >60)
GFR calc non Af Amer: >60 mL/min (ref >60)
Glucose, Bld: 125 mg/dL — ABNORMAL HIGH (ref 65–99)
Potassium: 3.3 mmol/L — ABNORMAL LOW (ref 3.5–5.1)
Sodium: 137 mmol/L (ref 135–145)
Total Bilirubin: 0.4 mg/dL (ref 0.3–1.2)
Total Protein: 6.2 g/dL — ABNORMAL LOW (ref 6.5–8.1)

## 2017-01-01 LAB — URINALYSIS, COMPLETE (UACMP) WITH MICROSCOPIC
Bacteria, UA: NONE SEEN
Bilirubin Urine: NEGATIVE
Glucose, UA: NEGATIVE mg/dL
Hgb urine dipstick: NEGATIVE
Ketones, ur: NEGATIVE mg/dL
Leukocytes, UA: NEGATIVE
Nitrite: NEGATIVE
Protein, ur: NEGATIVE mg/dL
RBC / HPF: NONE SEEN RBC/hpf (ref 0–5)
Specific Gravity, Urine: 1.003 — ABNORMAL LOW (ref 1.005–1.030)
pH: 7 (ref 5.0–8.0)

## 2017-01-01 LAB — LACTIC ACID, PLASMA: Lactic Acid, Venous: 1.9 mmol/L (ref 0.5–1.9)

## 2017-01-01 LAB — PROTIME-INR
INR: 1.04
Prothrombin Time: 13.5 seconds (ref 11.4–15.2)

## 2017-01-01 LAB — SEDIMENTATION RATE: Sed Rate: 70 mm/hr — ABNORMAL HIGH (ref 0–30)

## 2017-01-01 LAB — APTT: aPTT: 36 seconds (ref 24–36)

## 2017-01-01 MED ORDER — ONDANSETRON 4 MG PO TBDP
4.0000 mg | ORAL_TABLET | Freq: Once | ORAL | Status: AC
  Administered 2017-01-01: 4 mg via ORAL
  Filled 2017-01-01: qty 1

## 2017-01-01 MED ORDER — ACETAMINOPHEN 325 MG PO TABS
650.0000 mg | ORAL_TABLET | Freq: Once | ORAL | Status: AC
  Administered 2017-01-01: 650 mg via ORAL
  Filled 2017-01-01: qty 2

## 2017-01-01 MED ORDER — SODIUM CHLORIDE 0.9 % IV BOLUS (SEPSIS)
1000.0000 mL | Freq: Once | INTRAVENOUS | Status: AC
  Administered 2017-01-01: 1000 mL via INTRAVENOUS

## 2017-01-01 NOTE — ED Notes (Addendum)
Pt had red, purple appearing areas to bilateral arms, bilateral breasts, feet, bilateral groin, left side of face. Pt was evaluated and treated with antibiotics in ER previously but states that rash is getting worse. Pt receiving chemo, last on 10/10; seen at cancer center prior to coming to ER today. Pt alert and oriented X4, active, cooperative, pt in NAD. RR even and unlabored.

## 2017-01-01 NOTE — ED Notes (Signed)
Assisted patient to the restroom.  

## 2017-01-01 NOTE — ED Triage Notes (Signed)
Pt arrives to ER with complaints of a rash, she feels like it is burning inside. They did go to the cancer center because she gets chemo (lat chemo date was the 10th of this month) cancer center did not believe it is from [place]. Pt also report that after she wipes from a bowel movement she sees blood. Denies seeing it in her urine.

## 2017-01-01 NOTE — ED Notes (Signed)
Room avaliable at [place] [facility] [phone]

## 2017-01-01 NOTE — ED Provider Notes (Addendum)
[facility] Emergency Department Provider Note  ____________________________________________   I have reviewed the triage vital signs and the nursing notes.   HISTORY  Chief Complaint Rash    HPI Wanda Reed is a 61 y.o. female  whowas recently admitted for aortitis, and had a negative RPR, positive sedimentation rate which trended down, patient also is getting chemotherapy last chemotherapy with Neulasta was on the 11th of this month. presents today complaining of a diffuse burning rash which is present in both arms, right breast, both feet and the medial aspect, groin and rectal area. Has been getting gradually worse for 3 days. Had a similar but not quite as involved rash on her right arm several weeks ago, on the 30th. She's had no fever no vomiting, she has diffuse nausea which she states is chronic, she has had no abdominal pain chest pain or shortness of breath. no ocular no oral involvement.  Past Medical History:  Diagnosis Date  . Anxiety   . Breast cancer of upper-outer quadrant of right female breast ([place]) 08/2016   1.0 cm ER+, PR-, Her 2 neu not overexpressed. Node negative. T1b, N0.  [name] Cancer (HCC)    cervical  . Hypertension   . Hypothyroidism   . Thyroid disease     Patient Active Problem List   Diagnosis Date Noted  . Vomiting and diarrhea 12/10/2016  . Aortitis ([place]) 11/27/2016  . Sepsis ([place]) 10/26/2016  . Hyperkalemia 08/28/2016  . Wide-complex tachycardia ([place]) 08/28/2016  . Malignant neoplasm of upper-outer quadrant of right breast in female, estrogen receptor positive ([place]) 08/18/2016    Past Surgical History:  Procedure Laterality Date  . ABDOMINAL HYSTERECTOMY    . BREAST LUMPECTOMY Right 08/28/2016  . BREAST LUMPECTOMY WITH SENTINEL LYMPH NODE BIOPSY Right 08/28/2016   Procedure: BREAST LUMPECTOMY WITH SENTINEL LYMPH NODE BX;  Surgeon: [name], MD;  Location: [facility] ORS;  Service: General;  Laterality: Right;   . BREAST MAMMOSITE Right 09/15/2016   Procedure: MAMMOSITE BREAST;  Surgeon: [name], MD;  Location: [facility] ORS;  Service: General;  Laterality: Right;  . BREAST SURGERY Right    Breast Biopsy  . CERVIX SURGERY    . COLONOSCOPY  2008  . EYE SURGERY Bilateral    Cataract Extraction with IOL  . PORTA CATH INSERTION Right 11/20/2016   Procedure: PORTA CATH INSERTION;  Surgeon: [name], MD;  Location: [facility] ORS;  Service: General;  Laterality: Right;  . SHOULDER ARTHROSCOPY Right     Prior to Admission medications   Medication Sig Start Date End Date Taking? Authorizing Provider  acetaminophen (TYLENOL) 500 MG tablet Take 1,000 mg by mouth every 6 (six) hours as needed (for pain.).    [provider]  acyclovir (ZOVIRAX) 400 MG tablet Take 400 mg by mouth 3 (three) times daily as needed (cold sore outbreaks).     [provider]  cephALEXin (KEFLEX) 500 MG capsule Take by mouth.    [provider]  cetaphil (CETAPHIL) cream Apply 1 application topically as needed (dry skin).    [provider]  docusate sodium (COLACE) 100 MG capsule Take 100 mg by mouth 2 (two) times daily.    [provider]  levothyroxine (SYNTHROID, LEVOTHROID) 100 MCG tablet Take 100 mcg by mouth daily before breakfast.    [provider]  lisinopril (PRINIVIL,ZESTRIL) 10 MG tablet Take 10 mg by mouth daily.    [provider]  loperamide (IMODIUM A-D) 2 MG tablet  Take 1 tablet (2 mg total) by mouth 4 (four) times daily as needed for diarrhea or loose stools. Patient not taking: Reported on 12/24/2016 12/09/16   Jacquelin Hawking, NP  loratadine (CLARITIN) 10 MG tablet Take 10 mg by mouth as directed. Take 10 mg the day before, of, and after chemo    [provider]  Multiple Vitamin (MULTIVITAMIN WITH MINERALS) TABS tablet Take 1 tablet by mouth daily.    [provider]  ondansetron (ZOFRAN) 4 MG tablet Take 1 tablet (4 mg  total) by mouth every 8 (eight) hours as needed for nausea or vomiting. Patient not taking: Reported on 12/24/2016 12/09/16   Jacquelin Hawking, NP  oxyCODONE-acetaminophen (PERCOCET) 7.5-325 MG tablet Take 1 tablet by mouth every 6 (six) hours as needed for severe pain. 12/29/16 12/29/17  Lavonia Drafts, MD    Allergies Patient has no known allergies.  Family History  Problem Relation Age of Onset  . Breast cancer Neg Hx     Social History Social History  Substance Use Topics  . Smoking status: Never Smoker  . Smokeless tobacco: Never Used  . Alcohol use No    Review of Systems Constitutional: No fever/chills Eyes: No visual changes. ENT: No sore throat. No stiff neck no neck pain Cardiovascular: Denies chest pain. Respiratory: Denies shortness of breath. Gastrointestinal:   no vomiting.  No diarrhea.  No constipation. Genitourinary: Negative for dysuria. Musculoskeletal: Negative lower extremity swelling Skin: positivefor rash. Neurological: Negative for severe headaches, focal weakness or numbness.   ____________________________________________   PHYSICAL EXAM:  VITAL SIGNS: ED Triage Vitals  Enc Vitals Group     BP 01/01/17 1659 (!) 146/66     Pulse Rate 01/01/17 1659 68     Resp 01/01/17 1730 15     Temp 01/01/17 1659 98.9 F (37.2 C)     Temp Source 01/01/17 1659 Oral     SpO2 01/01/17 1659 100 %     Weight 01/01/17 1700 210 lb (95.3 kg)     Height 01/01/17 1700 6\' 1"  (1.854 m)     Head Circumference --      Peak Flow --      Pain Score 01/01/17 1659 5     Pain Loc --      Pain Edu? --      Excl. in [place]? --     Constitutional: Alert and oriented. Well appearing and in no acute distress. Eyes: Conjunctivae are normal Head: Atraumatic HEENT: No congestion/rhinnorhea. Mucous membranes are moist.  Oropharynx non-erythematous Neck:   Nontender with no meningismus, no masses, no stridor Cardiovascular: Normal rate, regular rhythm. Grossly normal heart  sounds.  Good peripheral circulation. Respiratory: Normal respiratory effort.  No retractions. Lungs CTAB. Abdominal: Soft and nontender. No distention. No guarding no rebound Back:  There is no focal tenderness or step off.  there is no midline tenderness there are no lesions noted. there is no CVA tenderness Musculoskeletal: No lower extremity tenderness, no upper extremity tenderness. No joint effusions, no DVT signs strong distal pulses no edema Neurologic:  Normal speech and language. No gross focal neurologic deficits are appreciated.  Skin:  Skin is warm, dry and intactthere is some desquamation around the rectum, but not invasive no evidence of abscess or significant sialitis, there is diffuse rash noted mostly in the areas of increased friction, the bridge of the feet bilaterally, the axilla bilaterally, both upper arms especially where she lays in the bed, her inguinal region, (female nurse  chaperone present for exam), as well as her rectal region. Patient rashes nonraised, it is not markedly tender, it is red, right breast is also involved. It is in the outlying parts blanchable but has a deeper purple and less blanchable regions more in the areas of confluence. There is no abscess noted. Psychiatric: Mood and affect are normal. Speech and behavior are normal.  ____________________________________________   LABS (all labs ordered are listed, but only abnormal results are displayed)  Labs Reviewed  SEDIMENTATION RATE - Abnormal; Notable for the following:       Result Value   Sed Rate 70 (*)    All other components within normal limits  CBC WITH DIFFERENTIAL/PLATELET - Abnormal; Notable for the following:    WBC 23.2 (*)    RBC 3.15 (*)    Hemoglobin 10.3 (*)    HCT 30.7 (*)    RDW 16.3 (*)    Platelets 113 (*)    Neutro Abs 20.9 (*)    All other components within normal limits  URINALYSIS, COMPLETE (UACMP) WITH MICROSCOPIC - Abnormal; Notable for the following:    Color, Urine  YELLOW (*)    APPearance CLEAR (*)    Specific Gravity, Urine 1.003 (*)    Squamous Epithelial / LPF 0-5 (*)    All other components within normal limits  COMPREHENSIVE METABOLIC PANEL - Abnormal; Notable for the following:    Potassium 3.3 (*)    Chloride 100 (*)    Glucose, Bld 125 (*)    BUN 5 (*)    Total Protein 6.2 (*)    Albumin 3.1 (*)    ALT 13 (*)    All other components within normal limits  CULTURE, BLOOD (ROUTINE X 2)  CULTURE, BLOOD (ROUTINE X 2)  URINE CULTURE  LACTIC ACID, PLASMA  PROTIME-INR  APTT  C-REACTIVE PROTEIN  LACTIC ACID, PLASMA    Pertinent labs  results that were available during my care of the patient were reviewed by me and considered in my medical decision making (see chart for details). ____________________________________________  EKG  I personally interpreted any EKGs ordered by me or triage  ____________________________________________  RADIOLOGY  Pertinent labs & imaging results that were available during my care of the patient were reviewed by me and considered in my medical decision making (see chart for details). If possible, patient and/or family made aware of any abnormal findings. ____________________________________________    PROCEDURES  Procedure(s) performed: None  Procedures  Critical Care performed: None  ____________________________________________   INITIAL IMPRESSION / ASSESSMENT AND PLAN / ED COURSE  Pertinent labs & imaging results that were available during my care of the patient were reviewed by me and considered in my medical decision making (see chart for details).  patient with diffuse rash mostly in the dependent and friction induced areas of the body armpits, back of the arms, in steps of the feet, intertriginous regions of the inguinal and rectal areas etc. 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 Patient has no fever. She does not  appear to be septic or lactic acid is negative she is not tachycardic however her sedimentation rate is again elevated, her white count is noted to be elevated but she did have Neulasta injection. She did have low platelets 3 days ago but that is going back up again. She has had waxing and waning values of these blood results over the last month. She did receive Neulasta as noted  a week ago. I did talk to Dr. Susy Manor who feels that that certainly could cause her white count. She agrees with possibly pulling antibiotics given patient's appearance, she also feels the patient should be admitted to a facility where biopsy could be obtained and further workup performed. Patient herself is in no acute distress at this time this is been going on for 3 days survey no markers for acute sepsis are noted at this time. I did do a call to [facility], they declined to accept patient because they are on diversion. We will try [facility] to see if they have inpatient dermatologic procedures and if not we will try [facility]. This hospital does not have the ability to do a biopsy which I think will likely be required. Given the patient IV fluids, she is resting comfortably pending disposition.  ----------------------------------------- 8:34 PM on 01/01/2017 -----------------------------------------  [facility] accepts patient however they are also on diversion,they agree with no antibiotics and they agree with management however, Dr. Oren Beckmann of their medicine service, we are awaiting disposition.  ----------------------------------------- 10:12 PM on 01/01/2017 -----------------------------------------  D/w oncology at [place] who agrees w mgt and no abx and accepts to their service. Family very happy to transfer her there.   Dr. Monica Martinez accepts.   ----------------------------------------- 12:00 AM on 01/02/2017 -----------------------------------------  Pt innad no fever here awaiting trasnport. Signed out to dr.  Beather Arbour at the end of my shift.    ____________________________________________   FINAL CLINICAL IMPRESSION(S) / ED DIAGNOSES  Final diagnoses:  None      This chart was dictated using voice recognition software.  Despite best efforts to proofread,  errors can occur which can change meaning.      Schuyler Amor, MD 01/01/17 1946    Schuyler Amor, MD 01/01/17 2034    Schuyler Amor, MD 01/01/17 2213    Schuyler Amor, MD 01/02/17 0000

## 2017-01-02 ENCOUNTER — Ambulatory Visit (HOSPITAL_COMMUNITY)
Admission: AD | Admit: 2017-01-02 | Discharge: 2017-01-02 | Disposition: A | Discharge status: Home / Self Care | Payer: Medicaid Other | Source: Other Acute Inpatient Hospital | Attending: Emergency Medicine | Admitting: Emergency Medicine

## 2017-01-02 ENCOUNTER — Telehealth: Payer: Self-pay | Admitting: *Deleted

## 2017-01-02 DIAGNOSIS — L258 Unspecified contact dermatitis due to other agents: Secondary | ICD-10-CM | POA: Diagnosis present

## 2017-01-02 LAB — URINE CULTURE: Culture: NO GROWTH

## 2017-01-02 LAB — C-REACTIVE PROTEIN: CRP: <0.8 mg/dL (ref <1.0)

## 2017-01-02 NOTE — ED Notes (Signed)
EMTALA checked for completion  

## 2017-01-02 NOTE — ED Notes (Signed)
Report to [facility]

## 2017-01-02 NOTE — Telephone Encounter (Signed)
Patient admitted to [facility] last evening with systemic rash and will be seen by Dermatology whilst there, suspect d/chg this weekend and asking if her 11/7 appointment needs to be moved up. I spoke with Dr Grayland Ormond who said he states he has no problem moving her appointment up, but he has nothing to offer for her rash, he prefers she see derm and follow up with them. I advised Santiago Glad of this and she will advise patient fam of this and will have daughter call our office once d/chged to change appt

## 2017-01-03 LAB — CULTURE, BLOOD (ROUTINE X 2)
Culture: NO GROWTH
Culture: NO GROWTH
Special Requests: ADEQUATE
Special Requests: ADEQUATE

## 2017-01-06 DIAGNOSIS — R234 Changes in skin texture: Secondary | ICD-10-CM

## 2017-01-06 DIAGNOSIS — R239 Unspecified skin changes: Secondary | ICD-10-CM | POA: Insufficient documentation

## 2017-01-06 DIAGNOSIS — T451X5A Adverse effect of antineoplastic and immunosuppressive drugs, initial encounter: Secondary | ICD-10-CM | POA: Insufficient documentation

## 2017-01-06 LAB — CULTURE, BLOOD (ROUTINE X 2)
Culture: NO GROWTH
Culture: NO GROWTH
Special Requests: ADEQUATE
Special Requests: ADEQUATE

## 2017-01-07 ENCOUNTER — Ambulatory Visit: Payer: Medicaid Other | Admitting: Gastroenterology

## 2017-01-21 ENCOUNTER — Other Ambulatory Visit: Payer: Medicaid Other

## 2017-01-21 ENCOUNTER — Ambulatory Visit: Payer: Medicaid Other | Admitting: Oncology

## 2017-01-21 NOTE — Progress Notes (Signed)
  Oncology Nurse Navigator Documentation  Navigator Location: [facility] (01/21/17 1100)   )Navigator Encounter Type: Telephone (01/21/17 1100)                       Treatment Phase: Follow-up (01/21/17 1100)                            Time Spent with Patient: 15 (01/21/17 1100)   Request received from [facility] for patient records.  Phoned patient, and spoke to daughter Lenna Sciara.  Per Lenna Sciara, patient is transferring her care.  She plans to keep her appointment at [facility] scheduled for 03/11/17.

## 2017-02-12 DIAGNOSIS — C50919 Malignant neoplasm of unspecified site of unspecified female breast: Secondary | ICD-10-CM | POA: Insufficient documentation

## 2017-03-11 ENCOUNTER — Ambulatory Visit: Payer: Medicaid Other | Admitting: General Surgery

## 2017-03-11 VITALS — BP 128/78 | HR 80 | Temp 97.6°F | Ht 61.0 in | Wt 153.0 lb

## 2017-03-11 DIAGNOSIS — Z170 Estrogen receptor positive status [ER+]: Secondary | ICD-10-CM

## 2017-03-11 DIAGNOSIS — C50411 Malignant neoplasm of upper-outer quadrant of right female breast: Secondary | ICD-10-CM | POA: Diagnosis not present

## 2017-03-11 NOTE — Patient Instructions (Signed)
Bilateral diagnostic mammogram and office visit in May

## 2017-03-11 NOTE — Progress Notes (Addendum)
Patient ID: Wanda Reed, female   DOB: 06/10/1955, 61 y.o.   MRN: [id number]  Chief Complaint  Patient presents with  . Follow-up    right breast cancer    HPI Wanda Reed is a 61 y.o. female here for right breast cancer follow up. She reports that her right breast is red and inflamed. She has been taking Augmentin for this since 03/06/17. She is being followed at [facility] for her oncology. She report the breast has improved a little.  She is here today with Gerrit Friends with [employer]. She is also here with Melissa her daughter.  She has been having pain in her arms when she sweeps, and also she gets tired and her legs hurt when she walks a lot.  Her last chemotherapy was in October.  HPI  Past Medical History:  Diagnosis Date  . Anxiety   . Breast cancer of upper-outer quadrant of right female breast ([place]) 08/2016   1.0 cm ER+, PR-, Her 2 neu not overexpressed. Node negative. T1b, N0.  [name] Cancer (HCC)    cervical  . Hypertension   . Hypothyroidism   . Thyroid disease     Past Surgical History:  Procedure Laterality Date  . ABDOMINAL HYSTERECTOMY    . BREAST LUMPECTOMY Right 08/28/2016  . BREAST LUMPECTOMY WITH SENTINEL LYMPH NODE BIOPSY Right 08/28/2016   Procedure: BREAST LUMPECTOMY WITH SENTINEL LYMPH NODE BX;  Surgeon: [name], MD;  Location: [facility] ORS;  Service: General;  Laterality: Right;  . BREAST MAMMOSITE Right 09/15/2016   Procedure: MAMMOSITE BREAST;  Surgeon: [name], MD;  Location: [facility] ORS;  Service: General;  Laterality: Right;  . BREAST SURGERY Right    Breast Biopsy  . CERVIX SURGERY    . COLONOSCOPY  2008  . EYE SURGERY Bilateral    Cataract Extraction with IOL  . PORTA CATH INSERTION Right 11/20/2016   Procedure: PORTA CATH INSERTION;  Surgeon: [name], MD;  Location: [facility] ORS;  Service: General;  Laterality: Right;  . SHOULDER ARTHROSCOPY Right     Family History  Problem Relation Age of Onset  .  Breast cancer Neg Hx     Social History Social History   Tobacco Use  . Smoking status: Never Smoker  . Smokeless tobacco: Never Used  Substance Use Topics  . Alcohol use: No  . Drug use: No    No Known Allergies  Current Outpatient Medications  Medication Sig Dispense Refill  . acetaminophen (TYLENOL) 500 MG tablet Take 1,000 mg by mouth every 6 (six) hours as needed (for pain.).    [name] acyclovir (ZOVIRAX) 400 MG tablet Take 400 mg by mouth 3 (three) times daily as needed (cold sore outbreaks).     [name] amoxicillin-clavulanate (AUGMENTIN) 500-125 MG tablet Take 1 tablet by mouth 3 (three) times daily.    [name] anastrozole (ARIMIDEX) 1 MG tablet Take 1 mg by mouth daily.    . cetaphil (CETAPHIL) cream Apply 1 application topically as needed (dry skin).    [name] docusate sodium (COLACE) 100 MG capsule Take 100 mg by mouth 2 (two) times daily.    [name] levothyroxine (SYNTHROID, LEVOTHROID) 100 MCG tablet Take 100 mcg by mouth daily before breakfast.    . lisinopril (PRINIVIL,ZESTRIL) 10 MG tablet Take 10 mg by mouth daily.    [name] loratadine (CLARITIN) 10 MG tablet Take 10 mg by mouth as directed. Take 10 mg the day before, of, and after chemo    . Multiple  Vitamin (MULTIVITAMIN WITH MINERALS) TABS tablet Take 1 tablet by mouth daily.     No current facility-administered medications for this visit.     Review of Systems Review of Systems  Constitutional: Negative.   Respiratory: Negative.   Cardiovascular: Negative.     Blood pressure 128/78, pulse 80, temperature 97.6 F (36.4 C), height '5\' 1"'  (1.549 m), weight 153 lb (69.4 kg).  Physical Exam Physical Exam  Constitutional: She is oriented to person, place, and time. She appears well-developed and well-nourished.  Eyes: Conjunctivae are normal. No scleral icterus.  Neck: Neck supple.  Cardiovascular: Normal rate, regular rhythm and normal heart sounds.  Pulmonary/Chest: Effort normal and breath sounds normal. Right breast exhibits  tenderness. Right breast exhibits no inverted nipple, no mass, no nipple discharge and no skin change. Left breast exhibits no inverted nipple, no mass, no nipple discharge, no skin change and no tenderness.    Musculoskeletal:       Arms: Lymphadenopathy:    She has no cervical adenopathy.    She has no axillary adenopathy.  Neurological: She is alert and oriented to person, place, and time.  Skin: Skin is warm and dry.  Psychiatric: She has a normal mood and affect.    Data Reviewed November 27, 2016 CT of the abdomen and pelvis: : 1. 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 Superior extent of involvement incompletely imaged. 2. 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 3. Mildly atrophic RIGHT kidney with mild chronic hydronephrosis. 4.  Aortic Atherosclerosis (ICD10-I70.0).  March 03, 2017 summary from [facility]: History of Present Illness: This is a pleasant 61 yr old Hispanic lady with history of Breast cancer Stage I , 1 cm tumor that was moderately differentiated , ER+PRnegative and Her2 negative . She was diagnosed and treated by Dr Grayland Ormond with 4x TC after she was found to be high risk by Mammaprint.  She reacted to the Taxotere with blistering rash of her hands and feet and rash of her face and arms. This rash was worse with each cycle of TC,She was admitted here at [facility] with rash and then discharged for follow up as outpatient. Pt wishes to stay here at [facility] for follow up.  She comes in complaining of pain and tenderness of the right breast No fevers . She does have flushing of her face and reports that is better than before.[name] She was started on antibiotics for the localized breast cellulitis.The antibiotics have not caused any hypersensitivity except for some itchiness,no worsening. The facial redness is better,the arms shows improvement in swelling and discoloration compared to last week's visit.  Impression Recommendations   1.Invasive breast carcinoma: ER+ PR- Her2 - Tolerating anastrazole well. No rashes. She will continue this for 5 years.   2. Breast tenderness Cellulitis , Started augmentin 540m tid for 7 days . Encouraged to go to ED if there is no response or worsening symptoms   SGloriann Loan MD  Assessment    Doing well post wide excision.  No evidence of clinically evident seroma involving the right breast.  Toxic reaction to chemotherapy treatment, resolved.    Plan    The patient reports [facility]  If this is confirmed we will arrange for this as an office procedure.  Bilateral diagnostic mammogram and office visit in May    HPI, Physical Exam, Assessment and Plan have been scribed under the direction and  in the presence of [name], MD  Concepcion Living, LPN  I have completed the exam and reviewed the above documentation for accuracy and completeness.  I agree with the above.  Haematologist has been used and any errors in dictation or transcription are unintentional.  Hervey Ard, M.D., F.A.C.S.  [name] 03/11/2017, 8:57 PM

## 2017-03-25 ENCOUNTER — Telehealth: Payer: Self-pay

## 2017-03-25 NOTE — Telephone Encounter (Signed)
Spoke with the patient thru [employer] about having her port removed. We have received the request for removal from Dr Merrie Roof from [facility]. She will speak with her daughter Lenna Sciara and have her daughter call us back to schedule this appointment.

## 2017-03-25 NOTE — Telephone Encounter (Signed)
Spoke with Lenna Sciara, patient's daughter. The patient is scheduled for port removal here in the office on 04/09/17 at 9:30 am.

## 2017-04-09 ENCOUNTER — Encounter: Payer: Self-pay | Admitting: General Surgery

## 2017-04-09 ENCOUNTER — Ambulatory Visit: Payer: Medicaid Other | Admitting: General Surgery

## 2017-04-09 VITALS — BP 130/72 | HR 72 | Resp 14 | Ht 61.0 in | Wt 158.0 lb

## 2017-04-09 DIAGNOSIS — Z170 Estrogen receptor positive status [ER+]: Secondary | ICD-10-CM

## 2017-04-09 DIAGNOSIS — C50411 Malignant neoplasm of upper-outer quadrant of right female breast: Secondary | ICD-10-CM

## 2017-04-09 NOTE — Progress Notes (Signed)
Patient ID: Wanda Reed, female   DOB: 08/23/1955, 62 y.o.   MRN: [id number]  Chief Complaint  Patient presents with  . Procedure    HPI [name] is a 62 y.o. female.  Here today for port removal. Interpreter present for interview, [place], exam and discussion.  HPI  Past Medical History:  Diagnosis Date  . Anxiety   . Breast cancer of upper-outer quadrant of right female breast ([place]) 08/2016   1.0 cm ER+, PR-, Her 2 neu not overexpressed. Node negative. T1b, N0.  [name] Cancer (HCC)    cervical  . Hypertension   . Hypothyroidism   . Thyroid disease     Past Surgical History:  Procedure Laterality Date  . ABDOMINAL HYSTERECTOMY    . BREAST LUMPECTOMY Right 08/28/2016  . BREAST LUMPECTOMY WITH SENTINEL LYMPH NODE BIOPSY Right 08/28/2016   Procedure: BREAST LUMPECTOMY WITH SENTINEL LYMPH NODE BX;  Surgeon: [name], MD;  Location: [facility] ORS;  Service: General;  Laterality: Right;  . BREAST MAMMOSITE Right 09/15/2016   Procedure: MAMMOSITE BREAST;  Surgeon: [name], MD;  Location: [facility] ORS;  Service: General;  Laterality: Right;  . BREAST SURGERY Right    Breast Biopsy  . CERVIX SURGERY    . COLONOSCOPY  2008  . EYE SURGERY Bilateral    Cataract Extraction with IOL  . PORTA CATH INSERTION Right 11/20/2016   Procedure: PORTA CATH INSERTION;  Surgeon: [name], MD;  Location: [facility] ORS;  Service: General;  Laterality: Right;  . SHOULDER ARTHROSCOPY Right     Family History  Problem Relation Age of Onset  . Breast cancer Neg Hx     Social History Social History   Tobacco Use  . Smoking status: Never Smoker  . Smokeless tobacco: Never Used  Substance Use Topics  . Alcohol use: No  . Drug use: No    No Known Allergies  Current Outpatient Medications  Medication Sig Dispense Refill  . acetaminophen (TYLENOL) 500 MG tablet Take 1,000 mg by mouth every 6 (six) hours as needed (for pain.).    [name] acyclovir (ZOVIRAX) 400 MG tablet Take 400  mg by mouth 3 (three) times daily as needed (cold sore outbreaks).     [name] anastrozole (ARIMIDEX) 1 MG tablet Take 1 mg by mouth daily.    . cetaphil (CETAPHIL) cream Apply 1 application topically as needed (dry skin).    [name] docusate sodium (COLACE) 100 MG capsule Take 100 mg by mouth 2 (two) times daily.    [name] levothyroxine (SYNTHROID, LEVOTHROID) 100 MCG tablet Take 100 mcg by mouth daily before breakfast.    . lisinopril (PRINIVIL,ZESTRIL) 10 MG tablet Take 10 mg by mouth daily.    [name] loratadine (CLARITIN) 10 MG tablet Take 10 mg by mouth as directed. Take 10 mg the day before, of, and after chemo    . Multiple Vitamin (MULTIVITAMIN WITH MINERALS) TABS tablet Take 1 tablet by mouth daily.     No current facility-administered medications for this visit.     Review of Systems Review of Systems  Constitutional: Negative.   Respiratory: Negative.   Cardiovascular: Negative.     Blood pressure 130/72, pulse 72, resp. rate 14, height 5\' 1"  (1.549 m), weight 158 lb (71.7 kg).  Physical Exam Physical Exam  Constitutional: She is oriented to person, place, and time. She appears well-developed and well-nourished.  Pulmonary/Chest:    Neurological: She is alert and oriented to person, place, and time.  Skin: Skin is  warm and dry.  Psychiatric: Her behavior is normal.    Data Reviewed 10 cc of 0.5% Xylocaine with 0.25% Marcaine with 1-200,000 units of epinephrine was supplemented with 3 cc of 1% plain Xylocaine.  ChloraPrep was applied to the skin.  The original incision was opened.  Transfixing sutures of Prolene removed.  The port was freed from the pocket and extracted with an intact tip.  No bleeding noted.  The deep layer was approximated with a running 3-0 Vicryl suture.  The skin closed with a running 3-0 Vicryl subcuticular suture.  Benzoin and Steri-Strip followed by Telfa and Tegaderm dressing applied.   Assessment    Excellent tolerance of port removal.    Plan    Postoperative  wound care reviewed.  Ice pack provided.  Tylenol if needed for pain.      HPI, Physical Exam, Assessment and Plan have been scribed under the direction and in the presence of [name], MD. Karie Fetch, RN  I have completed the exam and reviewed the above documentation for accuracy and completeness.  I agree with the above.  Haematologist has been used and any errors in dictation or transcription are unintentional.  Hervey Ard, M.D., F.A.C.S.  Forest Gleason Byrnett 04/09/2017, 7:43 PM

## 2017-04-09 NOTE — Patient Instructions (Signed)
May shower May remove dressing in 2-3 days Steri strips will gradually come off over 2-3 weeks May use an Ice pack as needed for comfort  

## 2017-04-23 ENCOUNTER — Encounter: Payer: Self-pay | Admitting: Radiation Oncology

## 2017-04-23 ENCOUNTER — Ambulatory Visit
Admission: RE | Admit: 2017-04-23 | Discharge: 2017-04-23 | Disposition: A | Discharge status: Home / Self Care | Payer: Medicaid Other | Source: Ambulatory Visit | Attending: Radiation Oncology | Admitting: Radiation Oncology

## 2017-04-23 ENCOUNTER — Other Ambulatory Visit: Payer: Self-pay

## 2017-04-23 VITALS — BP 136/69 | HR 79 | Temp 96.6°F | Resp 18 | Wt 153.6 lb

## 2017-04-23 DIAGNOSIS — Z923 Personal history of irradiation: Secondary | ICD-10-CM | POA: Insufficient documentation

## 2017-04-23 DIAGNOSIS — Z170 Estrogen receptor positive status [ER+]: Secondary | ICD-10-CM | POA: Insufficient documentation

## 2017-04-23 DIAGNOSIS — C50411 Malignant neoplasm of upper-outer quadrant of right female breast: Secondary | ICD-10-CM | POA: Diagnosis present

## 2017-04-23 DIAGNOSIS — Z79811 Long term (current) use of aromatase inhibitors: Secondary | ICD-10-CM | POA: Diagnosis not present

## 2017-04-23 NOTE — Progress Notes (Signed)
Radiation Oncology Follow up Note  Name: Wanda Reed   Date:   04/23/2017 MRN:  [id number] DOB: 03/07/1956    This 61 y.o. female presents to the clinic today for six-month follow-up status post accelerated partial breast radiation to her right breast for stage I ER/PR positive HER-2/neu negative invasive mammary carcinoma.  REFERRING PROVIDER: Patel, Sarah, MD  HPI: Patient is a 61-year-old female Spanish-speaking now out 6 months having completed accelerated partial breast radiation to her right breast for a stage I (T1b N0 M0) ER/PR positive HER-2/neu negative invasive mammary carcinoma status post wide local excision. 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 . She is currently on arimadex intolerant that well without side effect. She is seeing medical oncology has not yet had a mammogram ordered.  COMPLICATIONS OF TREATMENT: none  FOLLOW UP COMPLIANCE: keeps appointments   PHYSICAL EXAM:  BP 136/69   Pulse 79   Temp (!) 96.6 F (35.9 C)   Resp 18   Wt 153 lb 8.8 oz (69.7 kg)   BMI 29.01 kg/m  Lungs are clear to A&P cardiac examination essentially unremarkable with regular rate and rhythm. No dominant mass or nodularity is noted in either breast in 2 positions examined. Incision is well-healed. No axillary or supraclavicular adenopathy is appreciated. Cosmetic result is excellent. There is some firmness in the lumpectomy site consistent with high dose rate remote afterloading to the lumpectomy site. Well-developed well-nourished patient in NAD. HEENT reveals PERLA, EOMI, discs not visualized.  Oral cavity is clear. No oral mucosal lesions are identified. Neck is clear without evidence of cervical or supraclavicular adenopathy. Lungs are clear to A&P. Cardiac examination is essentially unremarkable with regular rate and rhythm without murmur rub or thrill. Abdomen is benign with no  organomegaly or masses noted. Motor sensory and DTR levels are equal and symmetric in the upper and lower extremities. Cranial nerves II through XII are grossly intact. Proprioception is intact. No peripheral adenopathy or edema is identified. No motor or sensory levels are noted. Crude visual fields are within normal range.  RADIOLOGY RESULTS: No current films for review   PLAN: At the present time she is doing well with no evidence of disease. I've emphasized that motion and exercise of right upper extremity will decrease some of the discomfort from most likely scar tissue and postlumpectomy healing of her incision. She continues on arimadex without side effect. I assume medical oncology will order follow-up mammograms in the next several months. I have asked to see her back in 1 year for follow-up. Patient and family know to call sooner with any concerns.  I would like to take this opportunity to thank you for allowing me to participate in the care of your patient..     Glenn Chrystal, MD   

## 2017-06-05 ENCOUNTER — Other Ambulatory Visit: Payer: Self-pay

## 2017-06-05 DIAGNOSIS — C50411 Malignant neoplasm of upper-outer quadrant of right female breast: Secondary | ICD-10-CM

## 2017-06-05 DIAGNOSIS — Z170 Estrogen receptor positive status [ER+]: Secondary | ICD-10-CM

## 2017-08-04 ENCOUNTER — Other Ambulatory Visit: Payer: Medicaid Other

## 2017-08-12 ENCOUNTER — Ambulatory Visit
Admission: RE | Admit: 2017-08-12 | Discharge: 2017-08-12 | Disposition: A | Discharge status: Home / Self Care | Payer: Medicaid Other | Source: Ambulatory Visit | Attending: General Surgery | Admitting: General Surgery

## 2017-08-12 DIAGNOSIS — C50411 Malignant neoplasm of upper-outer quadrant of right female breast: Secondary | ICD-10-CM

## 2017-08-12 DIAGNOSIS — Z170 Estrogen receptor positive status [ER+]: Secondary | ICD-10-CM

## 2017-08-12 HISTORY — DX: Personal history of antineoplastic chemotherapy: Z92.21

## 2017-08-12 HISTORY — DX: Malignant neoplasm of unspecified site of unspecified female breast: C50.919

## 2017-08-12 HISTORY — DX: Personal history of irradiation: Z92.3

## 2017-08-18 ENCOUNTER — Ambulatory Visit: Payer: Medicaid Other | Admitting: General Surgery

## 2017-08-27 ENCOUNTER — Ambulatory Visit: Payer: Medicaid Other | Admitting: General Surgery

## 2017-08-27 ENCOUNTER — Encounter: Payer: Self-pay | Admitting: General Surgery

## 2017-08-27 VITALS — BP 132/70 | HR 87 | Resp 16 | Ht 61.0 in | Wt 158.0 lb

## 2017-08-27 DIAGNOSIS — C50411 Malignant neoplasm of upper-outer quadrant of right female breast: Secondary | ICD-10-CM

## 2017-08-27 DIAGNOSIS — Z170 Estrogen receptor positive status [ER+]: Secondary | ICD-10-CM | POA: Diagnosis not present

## 2017-08-27 NOTE — Patient Instructions (Addendum)
The patient is aware to call back for any questions or concerns. The patient has been asked to return to the office in one year with a bilateral diagnostic mammogram. The patient is aware to use a heating pad as needed for comfort. Tylenol/Advil as needed for pain

## 2017-08-27 NOTE — Progress Notes (Signed)
Patient ID: Wanda Reed, female   DOB: December 07, 1955, 62 y.o.   MRN: [id number]  Chief Complaint  Patient presents with  . Follow-up    HPI Wanda Reed is a 62 y.o. female who presents for a breast evaluation. The most recent mammogram was done on 08/04/2017.  Patient does perform regular self breast checks and gets regular mammograms done.   She has discomfort right breast that is unchanged, her bra makes it worse. Tolerating the anastrozole.  Interpreter present, Wanda Reed, for discussion and exam.  HPI  Past Medical History:  Diagnosis Date  . Anxiety   . Breast cancer ([place]) 07/2016   right breast lumpectomy with chemo and mammosite  . Breast cancer of upper-outer quadrant of right female breast ([place]) 08/2016   1.0 cm ER+, PR-, Her 2 neu not overexpressed. Node negative. T1b, N0.  [name] Cancer (HCC)    cervical  . Hypertension   . Hypothyroidism   . Personal history of chemotherapy 11/2016-12/2016   right breast ca  . Personal history of radiation therapy 09/15/2016   mammosite  . Thyroid disease     Past Surgical History:  Procedure Laterality Date  . ABDOMINAL HYSTERECTOMY    . BREAST BIOPSY Right 08/08/2016   invasive mammary carcinoma  . BREAST LUMPECTOMY Right 08/28/2016   invasive mammary carcinoma, clear margins, negative lymph nodes  . BREAST LUMPECTOMY WITH SENTINEL LYMPH NODE BIOPSY Right 08/28/2016   Procedure: BREAST LUMPECTOMY WITH SENTINEL LYMPH NODE BX;  Surgeon: [name], MD;  Location: [facility] ORS;  Service: General;  Laterality: Right;  . BREAST MAMMOSITE Right 09/15/2016   Procedure: MAMMOSITE BREAST;  Surgeon: [name], MD;  Location: [facility] ORS;  Service: General;  Laterality: Right;  . BREAST SURGERY Right    Breast Biopsy  . CERVIX SURGERY    . COLONOSCOPY  2008  . EYE SURGERY Bilateral    Cataract Extraction with IOL  . PORTA CATH INSERTION Right 11/20/2016   Procedure: PORTA CATH INSERTION;  Surgeon: [name], MD;  Location:  [facility] ORS;  Service: General;  Laterality: Right;  . SHOULDER ARTHROSCOPY Right     Family History  Problem Relation Age of Onset  . Breast cancer Neg Hx     Social History Social History   Tobacco Use  . Smoking status: Never Smoker  . Smokeless tobacco: Never Used  Substance Use Topics  . Alcohol use: No  . Drug use: No    No Known Allergies  Current Outpatient Medications  Medication Sig Dispense Refill  . acetaminophen (TYLENOL) 500 MG tablet Take 1,000 mg by mouth every 6 (six) hours as needed (for pain.).    [name] acyclovir (ZOVIRAX) 400 MG tablet Take 400 mg by mouth 3 (three) times daily as needed (cold sore outbreaks).     [name] anastrozole (ARIMIDEX) 1 MG tablet Take 1 mg by mouth daily.    . cetaphil (CETAPHIL) cream Apply 1 application topically as needed (dry skin).    [name] docusate sodium (COLACE) 100 MG capsule Take 100 mg by mouth 2 (two) times daily.    [name] levothyroxine (SYNTHROID, LEVOTHROID) 100 MCG tablet Take 100 mcg by mouth daily before breakfast.    . lisinopril (PRINIVIL,ZESTRIL) 10 MG tablet Take 10 mg by mouth daily.    . Multiple Vitamin (MULTIVITAMIN WITH MINERALS) TABS tablet Take 1 tablet by mouth daily.     No current facility-administered medications for this visit.     Review of Systems Review of Systems  Constitutional: Negative.   Respiratory: Negative.   Cardiovascular: Negative.     Blood pressure 132/70, pulse 87, resp. rate 16, height 5\' 1"  (1.549 m), weight 158 lb (71.7 kg), SpO2 98 %.  Physical Exam Physical Exam  Constitutional: She is oriented to person, place, and time. She appears well-developed and well-nourished.  HENT:  Mouth/Throat: Oropharynx is clear and moist.  Eyes: Conjunctivae are normal. No scleral icterus.  Neck: Neck supple.  Cardiovascular: Normal rate, regular rhythm and normal heart sounds.  Pulmonary/Chest: Effort normal and breath sounds normal. Right breast exhibits no inverted nipple, no mass, no nipple  discharge, no skin change and no tenderness. Left breast exhibits no inverted nipple, no mass, no nipple discharge, no skin change and no tenderness.  Well healed right lumpectomy site with mild swelling.    Lymphadenopathy:    She has no cervical adenopathy.    She has no axillary adenopathy.       Right: No supraclavicular adenopathy present.  Neurological: She is alert and oriented to person, place, and time.  Skin: Skin is warm and dry.  Psychiatric: Her behavior is normal.   Examination of the upper extremity showed no visible asymmetry.  Measurements were obtained to the location 15 cm above as well as 10 and 20 cm below the olecranon process.  Right: 36, 25, 18.5 cm.  Left: 36, 24.5, 19 cm.   Data Reviewed Bilateral diagnostic mammograms of Aug 12, 2017 reviewed.  Postsurgical change.  BI-RADS-2.  Assessment    Doing well status post right breast conservation.    Plan  The patient is aware to use a heating pad as needed for comfort. Tylenol/Advil as needed for pain The patient has been asked to return to the office in one year with a bilateral diagnostic mammogram.   HPI, Physical Exam, Assessment and Plan have been scribed under the direction and in the presence of [name], MD. Karie Fetch, RN  I have completed the exam and reviewed the above documentation for accuracy and completeness.  I agree with the above.  Haematologist has been used and any errors in dictation or transcription are unintentional.  Hervey Ard, M.D., F.A.C.S.  Forest Gleason Christiana Gurevich 08/29/2017, 12:52 PM

## 2018-05-06 ENCOUNTER — Other Ambulatory Visit: Payer: Self-pay

## 2018-05-06 ENCOUNTER — Ambulatory Visit
Admission: RE | Admit: 2018-05-06 | Discharge: 2018-05-06 | Disposition: A | Discharge status: Home / Self Care | Payer: Medicaid Other | Source: Ambulatory Visit | Attending: Radiation Oncology | Admitting: Radiation Oncology

## 2018-05-06 ENCOUNTER — Encounter: Payer: Self-pay | Admitting: Radiation Oncology

## 2018-05-06 VITALS — BP 154/80 | HR 83 | Temp 97.3°F | Resp 18 | Wt 158.0 lb

## 2018-05-06 DIAGNOSIS — Z170 Estrogen receptor positive status [ER+]: Secondary | ICD-10-CM | POA: Insufficient documentation

## 2018-05-06 DIAGNOSIS — Z923 Personal history of irradiation: Secondary | ICD-10-CM | POA: Diagnosis not present

## 2018-05-06 DIAGNOSIS — Z79811 Long term (current) use of aromatase inhibitors: Secondary | ICD-10-CM | POA: Diagnosis not present

## 2018-05-06 DIAGNOSIS — C50411 Malignant neoplasm of upper-outer quadrant of right female breast: Secondary | ICD-10-CM | POA: Insufficient documentation

## 2018-05-06 NOTE — Progress Notes (Signed)
Radiation Oncology Follow up Note  Name: Wanda Reed   Date:   05/06/2018 MRN:  [id number] DOB: 06/23/1955    This 63 y.o. 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 She is seen today in routine follow-up is doing well. She specifically denies breast tenderness cough or bone pain. Still has significant scarring in the area of previous lumpectomy site secondary surgery and high dose rate remote afterloading..she is currently on arimadex tongue that well without side effect.patient had a mammogram back in May which I have reviewed was BI-RADS 2 benign  COMPLICATIONS OF TREATMENT: none  FOLLOW UP COMPLIANCE: keeps appointments   PHYSICAL EXAM:  BP (!) 154/80   Pulse 83   Temp (!) 97.3 F (36.3 C)   Resp 18   Wt 157 lb 15.4 oz (71.6 kg)   BMI 29.85 kg/m  Patient is significant thickening of the lumpectomy site of the right breast consistent with high dose rate remote afterloading and scar formation. No other dominant mass or nodularity is noted in either breast in 2 positions examined. No axillary or supraclavicular adenopathy is noted.Well-developed well-nourished patient in NAD. HEENT reveals PERLA, EOMI, discs not visualized.  Oral cavity is clear. No oral mucosal lesions are identified. Neck is clear without evidence of cervical or supraclavicular adenopathy. Lungs are clear to A&P. Cardiac examination is essentially unremarkable with regular rate and rhythm without murmur rub or thrill. Abdomen is benign with no organomegaly or masses noted. Motor sensory and DTR levels are equal and symmetric in  the upper and lower extremities. Cranial nerves II through XII are grossly intact. Proprioception is intact. No peripheral adenopathy or edema is identified. No motor or sensory levels are noted. Crude visual fields are within normal range.  RADIOLOGY RESULTS: mammograms reviewed and compatible above-stated findings  PLAN: present time patient continues to do well with no evidence of disease. I've asked to see her back in 1 year for follow-up. Patient continues on aromatase without side effect. Patient knows to call with any concerns at any time.  I would like to take this opportunity to thank you for allowing me to participate in the care of your patient.Noreene Filbert, MD

## 2018-05-17 ENCOUNTER — Inpatient Hospital Stay: Payer: Medicaid Other | Attending: Oncology | Admitting: Oncology

## 2018-05-17 ENCOUNTER — Other Ambulatory Visit: Payer: Self-pay

## 2018-05-17 ENCOUNTER — Encounter: Payer: Self-pay | Admitting: Oncology

## 2018-05-17 VITALS — BP 148/85 | HR 81 | Temp 97.6°F | Ht 61.0 in | Wt 158.6 lb

## 2018-05-17 DIAGNOSIS — Z923 Personal history of irradiation: Secondary | ICD-10-CM | POA: Insufficient documentation

## 2018-05-17 DIAGNOSIS — Z79811 Long term (current) use of aromatase inhibitors: Secondary | ICD-10-CM | POA: Diagnosis not present

## 2018-05-17 DIAGNOSIS — Z170 Estrogen receptor positive status [ER+]: Secondary | ICD-10-CM | POA: Insufficient documentation

## 2018-05-17 DIAGNOSIS — Z9221 Personal history of antineoplastic chemotherapy: Secondary | ICD-10-CM | POA: Insufficient documentation

## 2018-05-17 DIAGNOSIS — C50411 Malignant neoplasm of upper-outer quadrant of right female breast: Secondary | ICD-10-CM | POA: Diagnosis present

## 2018-05-17 NOTE — Progress Notes (Signed)
Patient is here to follow up on her right breast cancer. Patient stated that she had been having pain and edema on her right shoulder and arm. Patient is transferring back from [facility]. Merrie Roof. Patient lives in [place]. Patient stated that she was referred to PT and she will start on 05/18/2018. Patient also stated that she was prescribed Letrozole because of all the different types of side effects she has had with different medications. Patient stated that with Letrozole she has had hair loss, insomnia and vaginal dryness. Patient denied fever, chills, nausea, vomiting, diarrhea, constipation, nipple discharge, skin discoloration or knot/lumps.

## 2018-05-17 NOTE — Progress Notes (Signed)
Wanda Reed  Telephone:(336) [phone] Fax:(336) [phone]  ID: Alika Eppes OB: 17-Oct-1955  MR#: [id number]  MAU#:[id number]  Patient Care Team: Denton Lank, MD as PCP - General (Family Medicine) Rico Junker, RN as Registered Nurse Theodore Demark, RN as Registered Nurse Bary Castilla Forest Gleason, MD (General Surgery)  CHIEF COMPLAINT: Pathologic stage IA ER positive, PR/HER-2 negative, invasive carcinoma of the upper outer quadrant of the right breast. High risk MammaPrint.  INTERVAL HISTORY: Patient returns to clinic today to reestablish care.  She is last evaluated in October 2018 at which point she abruptly transferred her care to [facility].  She currently is on letrozole and is having several side effects including hair thinning, insomnia, and vaginal dryness.  She otherwise feels well.  She has no neurologic complaints.  She has a good appetite and denies weight loss. She has no chest pain or shortness of breath. She denies any nausea, vomiting, constipation, or diarrhea. She has no urinary complaints.  Patient offers no further specific complaints today.  REVIEW OF SYSTEMS:   Review of Systems  Constitutional: Negative.  Negative for fever, malaise/fatigue and weight loss.  Respiratory: Negative.  Negative for cough and shortness of breath.   Cardiovascular: Negative.  Negative for chest pain and leg swelling.  Gastrointestinal: Negative.  Negative for abdominal pain, nausea and vomiting.  Genitourinary: Negative.  Negative for dysuria.  Musculoskeletal: Negative.  Negative for back pain.  Skin: Negative.  Negative for rash.  Neurological: Negative.  Negative for focal weakness, weakness and headaches.  Psychiatric/Behavioral: Negative.  The patient is not nervous/anxious.     As per HPI. Otherwise, a complete review of systems is negative.  PAST MEDICAL HISTORY: Past Medical History:  Diagnosis Date  . Anxiety   . Breast cancer ([place]) 07/2016     right breast lumpectomy with chemo and mammosite  . Breast cancer of upper-outer quadrant of right female breast ([place]) 08/2016   1.0 cm ER+, PR-, Her 2 neu not overexpressed. Node negative. T1b, N0.  [name] Cancer (HCC)    cervical  . Hypertension   . Hypothyroidism   . Personal history of chemotherapy 11/2016-12/2016   right breast ca  . Personal history of radiation therapy 09/15/2016   mammosite  . Thyroid disease     PAST SURGICAL HISTORY: Past Surgical History:  Procedure Laterality Date  . ABDOMINAL HYSTERECTOMY    . BREAST BIOPSY Right 08/08/2016   invasive mammary carcinoma  . BREAST LUMPECTOMY Right 08/28/2016   invasive mammary carcinoma, clear margins, negative lymph nodes  . BREAST LUMPECTOMY WITH SENTINEL LYMPH NODE BIOPSY Right 08/28/2016   Procedure: BREAST LUMPECTOMY WITH SENTINEL LYMPH NODE BX;  Surgeon: [name], MD;  Location: [facility] ORS;  Service: General;  Laterality: Right;  . BREAST MAMMOSITE Right 09/15/2016   Procedure: MAMMOSITE BREAST;  Surgeon: [name], MD;  Location: [facility] ORS;  Service: General;  Laterality: Right;  . BREAST SURGERY Right    Breast Biopsy  . CERVIX SURGERY    . COLONOSCOPY  2008  . EYE SURGERY Bilateral    Cataract Extraction with IOL  . PORTA CATH INSERTION Right 11/20/2016   Procedure: PORTA CATH INSERTION;  Surgeon: [name], MD;  Location: [facility] ORS;  Service: General;  Laterality: Right;  . SHOULDER ARTHROSCOPY Right     FAMILY HISTORY: Family History  Problem Relation Age of Onset  . Breast cancer Neg Hx     ADVANCED DIRECTIVES (Y/N):  N  HEALTH  MAINTENANCE: Social History   Tobacco Use  . Smoking status: Never Smoker  . Smokeless tobacco: Never Used  Substance Use Topics  . Alcohol use: No  . Drug use: No     Colonoscopy:  PAP:  Bone density:  Lipid panel:  No Known Allergies  Current Outpatient Medications  Medication Sig Dispense Refill  . acyclovir (ZOVIRAX) 400 MG tablet Take  400 mg by mouth 3 (three) times daily as needed (cold sore outbreaks).     [name] letrozole (FEMARA) 2.5 MG tablet Take 2.5 mg by mouth daily.    [name] levothyroxine (SYNTHROID, LEVOTHROID) 100 MCG tablet Take 100 mcg by mouth daily before breakfast.    . lisinopril (PRINIVIL,ZESTRIL) 10 MG tablet Take 10 mg by mouth daily.    . Multiple Vitamin (MULTIVITAMIN WITH MINERALS) TABS tablet Take 1 tablet by mouth daily.     No current facility-administered medications for this visit.     OBJECTIVE: Vitals:   05/17/18 1018  BP: (!) 148/85  Pulse: 81  Temp: 97.6 F (36.4 C)     Body mass index is 29.97 kg/m.    ECOG FS:0 - Asymptomatic  General: Well-developed, well-nourished, no acute distress. Eyes: Pink conjunctiva, anicteric sclera. HEENT: Normocephalic, moist mucous membranes, clear oropharnyx. Breast: Patient declined breast exam today. Lungs: Clear to auscultation bilaterally. Heart: Regular rate and rhythm. No rubs, murmurs, or gallops. Abdomen: Soft, nontender, nondistended. No organomegaly noted, normoactive bowel sounds. Musculoskeletal: No edema, cyanosis, or clubbing. Neuro: Alert, answering all questions appropriately. Cranial nerves grossly intact. Skin: No rashes or petechiae noted. Psych: Normal affect. Lymphatics: No cervical, calvicular, axillary or inguinal LAD.  LAB RESULTS:  Lab Results  Component Value Date   NA 137 01/01/2017   K 3.3 (L) 01/01/2017   CL 100 (L) 01/01/2017   CO2 28 01/01/2017   GLUCOSE 125 (H) 01/01/2017   BUN 5 (L) 01/01/2017   CREATININE 0.55 01/01/2017   CALCIUM 8.9 01/01/2017   PROT 6.2 (L) 01/01/2017   ALBUMIN 3.1 (L) 01/01/2017   AST 38 01/01/2017   ALT 13 (L) 01/01/2017   ALKPHOS 104 01/01/2017   BILITOT 0.4 01/01/2017   GFRNONAA >60 01/01/2017   GFRAA >60 01/01/2017    Lab Results  Component Value Date   WBC 23.2 (H) 01/01/2017   NEUTROABS 20.9 (H) 01/01/2017   HGB 10.3 (L) 01/01/2017   HCT 30.7 (L) 01/01/2017   MCV 97.6  01/01/2017   PLT 113 (L) 01/01/2017     STUDIES: No results found.  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 High risk MammaPrint.  PLAN:    1. 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 High risk MammaPrint. Patient had lumpectomy on August 28, 2016 followed by MammoSite radiation therapy. Given her high risk MammaPrint, patient received adjuvant chemotherapy with Taxotere and Cytoxan.  She completed cycle 4 of 4 on December 24, 2016.  She abruptly transferred care to [facility] and was initiated on aromatase inhibitor.  Anastrozole was discontinued secondary to side effects.  She is now taking letrozole and despite the side effects listed in HPI she wishes to continue treatment completing a total of 5 years in approximately October 2023.  Given her high risk Mammaprint, she may benefit from up to 7 to 10 years of treatment.  Patient will require mammogram in May 2020.  Return to clinic in  3 months for routine evaluation.   2.  Vaginal dryness/hair thinning/insomnia: Possibly related to letrozole.  We discussed the possibility of switching to tamoxifen, but patient has declined at this time.  I spent a total of 30 minutes face-to-face with the patient of which greater than 50% of the visit was spent in counseling and coordination of care as detailed above.  Interpreter was present throughout the entire clinic visit.  Patient expressed understanding and was in agreement with this plan. She also understands that She can call clinic at any time with any questions, concerns, or complaints.   Cancer Staging Malignant neoplasm of upper-outer quadrant of right breast in female, estrogen receptor positive ([place]) Staging form: Breast, AJCC 8th Edition - Pathologic stage from 10/01/2016: Stage IA (pT1b, pN0, cM0, G2, ER:  Positive, PR: Negative, HER2: Negative) - Signed by Lloyd Huger, MD on 10/01/2016   Lloyd Huger, MD   05/18/2018 3:21 PM

## 2018-07-26 ENCOUNTER — Other Ambulatory Visit: Payer: Self-pay | Admitting: *Deleted

## 2018-07-26 MED ORDER — LETROZOLE 2.5 MG PO TABS: 2.5000 mg | ORAL_TABLET | Freq: Every day | ORAL | 0 refills | Status: DC

## 2018-08-13 NOTE — Progress Notes (Signed)
[place]  Telephone:(336) [phone] Fax:(336) [phone]  ID: Wanda Reed OB: 08-Jan-1956  MR#: [id number]  ZSM#:[id number]  Patient Care Team: Denton Lank, MD as PCP - General (Family Medicine) Rico Junker, RN as Registered Nurse Theodore Demark, RN as Registered Nurse Bary Castilla Forest Gleason, MD (General Surgery)  I connected with [name] on 08/18/18 at 10:15 AM EDT by video enabled telemedicine visit and verified that I am speaking with the correct person using two identifiers.   I discussed the limitations, risks, security and privacy concerns of performing an evaluation and management service by telemedicine and the availability of in-person appointments. I also discussed with the patient that there may be a patient responsible charge related to this service. The patient expressed understanding and agreed to proceed.   Other persons participating in the visit and their role in the encounter: Patient, patient's daughter, interpreter, MD.  Patient's location: Home Provider's location: Clinic  CHIEF COMPLAINT: Pathologic stage IA ER positive, PR/HER-2 negative, invasive carcinoma of the upper outer quadrant of the right breast. High risk MammaPrint.  INTERVAL HISTORY: Patient agreed to video enabled telemedicine visit for her routine 93-monthevaluation.  She has occasional joint pain in her hands that is well controlled with Tylenol, but is otherwise tolerating letrozole well without significant side effects.  She has occasional dysphasia of unclear etiology.  She has no neurologic complaints.  She has a good appetite and denies weight loss.  She denies any chest pain, shortness of breath, cough, or hemoptysis.  She denies any nausea, vomiting, constipation, or diarrhea. She has no urinary complaints.  Patient offers no further specific complaints today.  REVIEW OF SYSTEMS:   Review of Systems  Constitutional: Negative.  Negative for fever, malaise/fatigue  and weight loss.  Respiratory: Negative.  Negative for cough and shortness of breath.   Cardiovascular: Negative.  Negative for chest pain and leg swelling.  Gastrointestinal: Negative.  Negative for abdominal pain, nausea and vomiting.  Genitourinary: Negative.  Negative for dysuria.  Musculoskeletal: Positive for joint pain. Negative for back pain.  Skin: Negative.  Negative for rash.  Neurological: Negative.  Negative for dizziness, focal weakness, weakness and headaches.  Psychiatric/Behavioral: Negative.  The patient is not nervous/anxious.     As per HPI. Otherwise, a complete review of systems is negative.  PAST MEDICAL HISTORY: Past Medical History:  Diagnosis Date  . Anxiety   [place]   right breast lumpectomy with chemo and mammosite  . [place]   1.0 cm ER+, PR-, Her 2 neu not overexpressed. Node negative. T1b, N0.  .Marland KitchenCancer (HCC)    cervical  . Hypertension   . Hypothyroidism   . Personal history of chemotherapy 11/2016-12/2016   right breast ca  . Personal history of radiation therapy 09/15/2016   mammosite  . Thyroid disease     PAST SURGICAL HISTORY: Past Surgical History:  Procedure Laterality Date  . ABDOMINAL HYSTERECTOMY    . BREAST BIOPSY Right 08/08/2016   invasive mammary carcinoma  . BREAST LUMPECTOMY Right 08/28/2016   invasive mammary carcinoma, clear margins, negative lymph nodes  . BREAST LUMPECTOMY WITH SENTINEL LYMPH NODE BIOPSY Right 08/28/2016   Procedure: BREAST LUMPECTOMY WITH SENTINEL LYMPH NODE BX;  Surgeon: BRobert Bellow MD;  Location: [facility] ORS;  Service: General;  Laterality: Right;  . BREAST MAMMOSITE Right 09/15/2016   Procedure: MAMMOSITE BREAST;  Surgeon: BRobert Bellow MD;  Location: [facility] ORS;  Service: General;  Laterality: Right;  . BREAST SURGERY Right    Breast Biopsy  . CERVIX SURGERY    . COLONOSCOPY  2008  . EYE SURGERY  Bilateral    Cataract Extraction with IOL  . PORTA CATH INSERTION Right 11/20/2016   Procedure: PORTA CATH INSERTION;  Surgeon: [name], MD;  Location: [facility] ORS;  Service: General;  Laterality: Right;  . SHOULDER ARTHROSCOPY Right     FAMILY HISTORY: Family History  Problem Relation Age of Onset  . Breast cancer Neg Hx     ADVANCED DIRECTIVES (Y/N):  N  HEALTH MAINTENANCE: Social History   Tobacco Use  . Smoking status: Never Smoker  . Smokeless tobacco: Never Used  Substance Use Topics  . Alcohol use: No  . Drug use: No     Colonoscopy:  PAP:  Bone density:  Lipid panel:  No Known Allergies  Current Outpatient Medications  Medication Sig Dispense Refill  . acyclovir (ZOVIRAX) 400 MG tablet Take 400 mg by mouth 3 (three) times daily as needed (cold sore outbreaks).     [name] letrozole (FEMARA) 2.5 MG tablet Take 1 tablet (2.5 mg total) by mouth daily. 90 tablet 0  . levothyroxine (SYNTHROID, LEVOTHROID) 100 MCG tablet Take 100 mcg by mouth daily before breakfast.    . lisinopril (PRINIVIL,ZESTRIL) 10 MG tablet Take 10 mg by mouth daily.    . Multiple Vitamin (MULTIVITAMIN WITH MINERALS) TABS tablet Take 1 tablet by mouth daily.    [name] sulfamethoxazole-trimethoprim (BACTRIM) 400-80 MG tablet Take 1 tablet by mouth 2 (two) times daily.     No current facility-administered medications for this visit.     OBJECTIVE: There were no vitals filed for this visit.   There is no height or weight on file to calculate BMI.    ECOG FS:0 - Asymptomatic  General: Well-developed, well-nourished, no acute distress. HEENT: Normocephalic. Neuro: Alert, answering all questions appropriately. Cranial nerves grossly intact. Skin: No rashes or petechiae noted. Psych: Normal affect.  LAB RESULTS:  Lab Results  Component Value Date   NA 137 01/01/2017   K 3.3 (L) 01/01/2017   CL 100 (L) 01/01/2017   CO2 28 01/01/2017   GLUCOSE 125 (H) 01/01/2017   BUN 5 (L) 01/01/2017    CREATININE 0.55 01/01/2017   CALCIUM 8.9 01/01/2017   PROT 6.2 (L) 01/01/2017   ALBUMIN 3.1 (L) 01/01/2017   AST 38 01/01/2017   ALT 13 (L) 01/01/2017   ALKPHOS 104 01/01/2017   BILITOT 0.4 01/01/2017   GFRNONAA >60 01/01/2017   GFRAA >60 01/01/2017    Lab Results  Component Value Date   WBC 23.2 (H) 01/01/2017   NEUTROABS 20.9 (H) 01/01/2017   HGB 10.3 (L) 01/01/2017   HCT 30.7 (L) 01/01/2017   MCV 97.6 01/01/2017   PLT 113 (L) 01/01/2017     STUDIES: Mm Diag Breast Tomo Bilateral  Result Date: 08/16/2018 CLINICAL DATA:  History of right breast cancer status post lumpectomy in 2018. EXAM: DIGITAL DIAGNOSTIC BILATERAL MAMMOGRAM WITH CAD AND TOMO COMPARISON:  Previous exam(s). ACR Breast Density Category b: There are scattered areas of fibroglandular density. FINDINGS: Lumpectomy changes are seen in the upper-outer quadrant of the right breast. In the anterior third of the upper-outer quadrant there are developing coarse calcifications that are felt to be dystrophic. There is no suspicious mass. No suspicious mass or malignant type microcalcifications identified in the left breast. Mammographic images were processed with CAD. IMPRESSION: Probable benign  dystrophic calcifications in the anterior third of the upper-outer quadrant of the right breast. RECOMMENDATION: Short-term interval follow-up right mammogram in 6 months is recommended. I have discussed the findings and recommendations with the patient. Results were also provided in writing at the conclusion of the visit. If applicable, a reminder letter will be sent to the patient regarding the next appointment. BI-RADS CATEGORY  3: Probably benign. Electronically Signed   By: Lillia Mountain M.D.   On: 08/16/2018 12:07    ASSESSMENT: Pathologic stage IA ER positive, PR/HER-2 negative, invasive carcinoma of the upper outer quadrant of the right breast. High risk MammaPrint.  PLAN:    1. Pathologic stage IA ER positive, PR/HER-2 negative,  invasive carcinoma of the upper outer quadrant of the right breast. High risk MammaPrint. Patient had lumpectomy on August 28, 2016 followed by MammoSite radiation therapy. Given her high risk MammaPrint, patient received adjuvant chemotherapy with Taxotere and Cytoxan.  She completed cycle 4 of 4 on December 24, 2016.  She abruptly transferred care to [facility] and was initiated on aromatase inhibitor.  Anastrozole was discontinued secondary to side effects.  She is now on letrozole and will complete 5 years of treatment in 2023.  Given her high risk MammaPrint may consider extending treatment.  Her most recent mammogram on August 16, 2018 was reported as BI-RADS 3, repeat in 6 months.  Return to clinic in 6 months for routine evaluation.   2.  Joint pain: Possibly related to letrozole.  Continue symptomatic treatment with Tylenol.  I provided 25 minutes of face-to-face video visit time during this encounter, and > 50% was spent counseling as documented under my assessment & plan.   Interpreter was present throughout the entire telemedicine visit.  Patient expressed understanding and was in agreement with this plan. She also understands that She can call clinic at any time with any questions, concerns, or complaints.   Cancer Staging Malignant neoplasm of upper-outer quadrant of right breast in female, estrogen receptor positive ([place]) Staging form: Breast, AJCC 8th Edition - Pathologic stage from 10/01/2016: Stage IA (pT1b, pN0, cM0, G2, ER: Positive, PR: Negative, HER2: Negative) - Signed by Lloyd Huger, MD on 10/01/2016   Lloyd Huger, MD   08/18/2018 7:19 AM

## 2018-08-16 ENCOUNTER — Other Ambulatory Visit: Payer: Self-pay

## 2018-08-16 ENCOUNTER — Ambulatory Visit
Admission: RE | Admit: 2018-08-16 | Discharge: 2018-08-16 | Disposition: A | Discharge status: Home / Self Care | Payer: Medicaid Other | Source: Ambulatory Visit | Attending: Oncology | Admitting: Oncology

## 2018-08-16 DIAGNOSIS — C50411 Malignant neoplasm of upper-outer quadrant of right female breast: Secondary | ICD-10-CM | POA: Insufficient documentation

## 2018-08-16 DIAGNOSIS — Z170 Estrogen receptor positive status [ER+]: Secondary | ICD-10-CM | POA: Insufficient documentation

## 2018-08-17 ENCOUNTER — Encounter: Payer: Self-pay | Admitting: Oncology

## 2018-08-17 ENCOUNTER — Other Ambulatory Visit: Payer: Self-pay

## 2018-08-17 ENCOUNTER — Inpatient Hospital Stay: Payer: Medicaid Other | Attending: Oncology | Admitting: Oncology

## 2018-08-17 DIAGNOSIS — Z170 Estrogen receptor positive status [ER+]: Secondary | ICD-10-CM | POA: Diagnosis not present

## 2018-08-17 DIAGNOSIS — C50411 Malignant neoplasm of upper-outer quadrant of right female breast: Secondary | ICD-10-CM

## 2018-08-17 NOTE — Progress Notes (Signed)
Patient stated that she had been doing well with no complaints. Patient stated that when she did her mammogram on 08/16/2018 she was told that she had fibroids and needed to have it repeated in 6 months. Patient also stated that she had been having dysphagia for the past three months. Patient had lost some weight since her clothes is fitting loose. Patient also stated that she continues to have bone aches on her hands and feet. Patient does her monthly self-breast exams.

## 2018-08-24 ENCOUNTER — Other Ambulatory Visit: Payer: Self-pay

## 2018-08-24 DIAGNOSIS — Z170 Estrogen receptor positive status [ER+]: Secondary | ICD-10-CM

## 2018-08-24 DIAGNOSIS — C50411 Malignant neoplasm of upper-outer quadrant of right female breast: Secondary | ICD-10-CM

## 2018-09-30 ENCOUNTER — Ambulatory Visit: Payer: Medicaid Other | Admitting: General Surgery

## 2018-10-04 ENCOUNTER — Encounter: Payer: Self-pay | Admitting: *Deleted

## 2018-10-12 ENCOUNTER — Ambulatory Visit: Payer: Medicaid Other | Admitting: General Surgery

## 2018-10-20 ENCOUNTER — Ambulatory Visit: Payer: Medicaid Other | Admitting: Surgery

## 2018-11-08 ENCOUNTER — Other Ambulatory Visit: Payer: Self-pay

## 2018-11-08 ENCOUNTER — Encounter: Payer: Self-pay | Admitting: Surgery

## 2018-11-08 ENCOUNTER — Ambulatory Visit (INDEPENDENT_AMBULATORY_CARE_PROVIDER_SITE_OTHER): Payer: Medicaid Other | Admitting: Surgery

## 2018-11-08 VITALS — BP 162/84 | HR 74 | Temp 97.7°F | Ht 61.0 in | Wt 161.0 lb

## 2018-11-08 DIAGNOSIS — Z170 Estrogen receptor positive status [ER+]: Secondary | ICD-10-CM | POA: Diagnosis not present

## 2018-11-08 DIAGNOSIS — C50411 Malignant neoplasm of upper-outer quadrant of right female breast: Secondary | ICD-10-CM

## 2018-11-08 NOTE — Patient Instructions (Addendum)
Seguimiento en 6 meses. Programaremos su mamografa por usted  llmanos con cualquier duda    We will schedule with Dr Posey Pronto your primary care doctor for neuropathy and calcium deficiency.

## 2018-11-09 NOTE — Progress Notes (Signed)
Outpatient Surgical Follow Up  11/09/2018  Wanda Reed is an 63 y.o. female.   Chief Complaint  Patient presents with  . Follow-up    Breast Cancer    HPI: Wanda Reed is a 63 year old female with a history of left breast lumpectomy as well as axillary radiation.  She did capsular surgery by Dr. Bary Castilla on June 2018.  He did not receive chemotherapy as well.  She comes for a regular routine mammogram and physical exam.  She denies any fevers and chills no lumps or bumps.  Denies any breast discharge.  She is able to perform more than 4 METS of activity without any chest pain or shortness of breath.  She denies any fevers or chills or weight loss.  Mammogram personally reviewed showing evidence of some calcifications on the right breast.  No discrete masses  Past Medical History:  Diagnosis Date  . Anxiety   . Breast cancer ([place]) 07/2016   right breast lumpectomy with chemo and mammosite  . Breast cancer of upper-outer quadrant of right female breast ([place]) 08/2016   1.0 cm ER+, PR-, Her 2 neu not overexpressed. Node negative. T1b, N0.  [name] Cancer (HCC)    cervical  . Hypertension   . Hypothyroidism   . Personal history of chemotherapy 11/2016-12/2016   right breast ca  . Personal history of radiation therapy 09/15/2016   mammosite  . Thyroid disease     Past Surgical History:  Procedure Laterality Date  . ABDOMINAL HYSTERECTOMY    . BREAST BIOPSY Right 08/08/2016   invasive mammary carcinoma  . BREAST LUMPECTOMY Right 08/28/2016   invasive mammary carcinoma, clear margins, negative lymph nodes  . BREAST LUMPECTOMY WITH SENTINEL LYMPH NODE BIOPSY Right 08/28/2016   Procedure: BREAST LUMPECTOMY WITH SENTINEL LYMPH NODE BX;  Surgeon: [name], MD;  Location: [facility] ORS;  Service: General;  Laterality: Right;  . BREAST MAMMOSITE Right 09/15/2016   Procedure: MAMMOSITE BREAST;  Surgeon: [name], MD;  Location: [facility] ORS;  Service: General;  Laterality: Right;  .  BREAST SURGERY Right    Breast Biopsy  . CERVIX SURGERY    . COLONOSCOPY  2008  . EYE SURGERY Bilateral    Cataract Extraction with IOL  . PORTA CATH INSERTION Right 11/20/2016   Procedure: PORTA CATH INSERTION;  Surgeon: [name], MD;  Location: [facility] ORS;  Service: General;  Laterality: Right;  . SHOULDER ARTHROSCOPY Right     Family History  Problem Relation Age of Onset  . Breast cancer Neg Hx     Social History:  reports that she has never smoked. She has never used smokeless tobacco. She reports that she does not drink alcohol or use drugs.  Allergies: No Known Allergies  Medications reviewed.    ROS Full ROS performed and is otherwise negative other than what is stated in HPI   BP (!) 162/84   Pulse 74   Temp 97.7 F (36.5 C)   Ht 5\' 1"  (1.549 m)   Wt 161 lb (73 kg)   SpO2 97%   BMI 30.42 kg/m   Physical Exam Vitals signs and nursing note reviewed. Exam conducted with a chaperone present.  Constitutional:      General: She is not in acute distress.    Appearance: Normal appearance. She is normal weight.  Eyes:     General:        Right eye: No discharge.        Left eye: No discharge.  Neck:     Musculoskeletal: Normal range of motion. No neck rigidity or muscular tenderness.  Cardiovascular:     Rate and Rhythm: Normal rate and regular rhythm.     Pulses: Normal pulses.     Heart sounds: Normal heart sounds.  Pulmonary:     Effort: Pulmonary effort is normal. No respiratory distress.     Breath sounds: Normal breath sounds. No stridor.     Comments: BREAST; evidence of previous left lumpectomy no evidence of new breast masses.  Normal nipple normal axilla.  No evidence of any skin changes. Abdominal:     General: Abdomen is flat. There is no distension.     Palpations: There is no mass.     Tenderness: There is no abdominal tenderness. There is no guarding or rebound.     Hernia: No hernia is present.  Skin:    Capillary Refill: Capillary  refill takes less than 2 seconds.  Neurological:     General: No focal deficit present.     Mental Status: She is alert and oriented to person, place, and time.  Psychiatric:        Mood and Affect: Mood normal.        Behavior: Behavior normal.        Thought Content: Thought content normal.        Judgment: Judgment normal.       Assessment/Plan:  63 year old with some dystrophic calcification on the right breast.  I agree with short-term follow-up to include a mammogram and a repeat physical exam in 6 months.  Had an extensive discussion with the patient regarding findings and she understands   Greater than 50% of the 25 minutes  visit was spent in counseling/coordination of care   [name], MD [place] Surgeon

## 2018-12-30 ENCOUNTER — Other Ambulatory Visit: Payer: Self-pay

## 2018-12-30 DIAGNOSIS — Z20822 Contact with and (suspected) exposure to covid-19: Secondary | ICD-10-CM

## 2019-01-01 LAB — NOVEL CORONAVIRUS, NAA: SARS-CoV-2, NAA: NOT DETECTED

## 2019-01-03 ENCOUNTER — Telehealth: Payer: Self-pay | Admitting: General Practice

## 2019-01-03 ENCOUNTER — Other Ambulatory Visit: Payer: Self-pay | Admitting: Oncology

## 2019-01-03 DIAGNOSIS — R921 Mammographic calcification found on diagnostic imaging of breast: Secondary | ICD-10-CM

## 2019-01-03 DIAGNOSIS — R928 Other abnormal and inconclusive findings on diagnostic imaging of breast: Secondary | ICD-10-CM

## 2019-01-03 NOTE — Telephone Encounter (Signed)
Negative COVID results given. Patient results "NOT Detected." Caller expressed understanding. ° °

## 2019-01-21 ENCOUNTER — Other Ambulatory Visit: Payer: Self-pay | Admitting: *Deleted

## 2019-01-21 MED ORDER — LETROZOLE 2.5 MG PO TABS: 2.5000 mg | ORAL_TABLET | Freq: Every day | ORAL | 0 refills | Status: DC

## 2019-01-25 ENCOUNTER — Inpatient Hospital Stay: Payer: Medicaid Other | Attending: Nurse Practitioner | Admitting: Nurse Practitioner

## 2019-01-25 ENCOUNTER — Telehealth: Payer: Self-pay | Admitting: *Deleted

## 2019-01-25 ENCOUNTER — Other Ambulatory Visit: Payer: Self-pay

## 2019-01-25 ENCOUNTER — Encounter: Payer: Self-pay | Admitting: Nurse Practitioner

## 2019-01-25 VITALS — BP 174/75 | HR 85 | Temp 98.3°F | Resp 18 | Wt 159.0 lb

## 2019-01-25 DIAGNOSIS — C50411 Malignant neoplasm of upper-outer quadrant of right female breast: Secondary | ICD-10-CM | POA: Diagnosis present

## 2019-01-25 DIAGNOSIS — Z79811 Long term (current) use of aromatase inhibitors: Secondary | ICD-10-CM | POA: Diagnosis not present

## 2019-01-25 DIAGNOSIS — I10 Essential (primary) hypertension: Secondary | ICD-10-CM | POA: Diagnosis not present

## 2019-01-25 DIAGNOSIS — Z9221 Personal history of antineoplastic chemotherapy: Secondary | ICD-10-CM | POA: Insufficient documentation

## 2019-01-25 DIAGNOSIS — E039 Hypothyroidism, unspecified: Secondary | ICD-10-CM | POA: Diagnosis not present

## 2019-01-25 DIAGNOSIS — N390 Urinary tract infection, site not specified: Secondary | ICD-10-CM | POA: Insufficient documentation

## 2019-01-25 DIAGNOSIS — Z923 Personal history of irradiation: Secondary | ICD-10-CM | POA: Insufficient documentation

## 2019-01-25 DIAGNOSIS — N61 Mastitis without abscess: Secondary | ICD-10-CM

## 2019-01-25 DIAGNOSIS — Z90710 Acquired absence of both cervix and uterus: Secondary | ICD-10-CM | POA: Diagnosis not present

## 2019-01-25 DIAGNOSIS — Z79899 Other long term (current) drug therapy: Secondary | ICD-10-CM | POA: Diagnosis not present

## 2019-01-25 DIAGNOSIS — Z170 Estrogen receptor positive status [ER+]: Secondary | ICD-10-CM | POA: Insufficient documentation

## 2019-01-25 MED ORDER — DOXYCYCLINE HYCLATE 100 MG PO TABS: 100.0000 mg | ORAL_TABLET | Freq: Two times a day (BID) | ORAL | 0 refills | Status: DC

## 2019-01-25 NOTE — Patient Instructions (Signed)
Mastitis Mastitis  La mastitis es una inflamacin del tejido [place]. Ocurre con mayor frecuencia en las mujeres que [place], pero tambin puede afectar a otras mujeres y, a [place], incluso a los hombres. Cules son las causas? Generalmente, la causa de esta afeccin es una infeccin bacteriana. Las bacterias ingresan al tejido mamario travs de cortes o grietas en la piel. Comnmente, esto ocurre al [employer], debido a grietas o irritacin de los pezones. En algunos casos, puede ocurrir cuando no hay grietas en la piel. Por lo general, se debe a que se tapan los conductos galactforos. Algunas otras causas son las siguientes:  Un piercing en los pezones.  Algunas formas de cncer de mama. Cules son los signos o los sntomas? Los sntomas de esta afeccin incluyen los siguientes:  Clinical cytogeneticist, enrojecimiento, sensibilidad y Social research officer, government en la zona de la mama. La zona tambin puede sentirse caliente al tacto. Por lo general, estos sntomas afectan la parte superior de la mama, hacia la regin de la [place].  Hinchazn de los ganglios que se encuentran debajo del brazo, en el mismo lado.  Cristy Hilts.  Pulso rpido.  Fatiga, dolor de [place] y dolores musculares similares a los que se experimentan cuando se tiene gripe. Si se permite que la infeccin progrese, puede formarse una acumulacin de pus (absceso). Cmo se diagnostica? Habitualmente, esta afeccin puede diagnosticarse en funcin de un examen fsico y de sus sntomas. Tambin pueden hacerle otros estudios, por ejemplo:  Anlisis de sangre para determinar si su organismo est luchando contra una infeccin bacteriana.  Lavinia Sharps o una ecografa para descartar otros problemas o enfermedades.  Anlisis de pus y otros lquidos. Puede obtenerse pus de la mama y examinarse en el laboratorio. Si hay un absceso, podrn retirarle el lquido con [place]. Este anlisis tambin se [place] para Physicist, medical diagnstico e identificar la bacteria que  causa el problema.  Si est amamantando, la [employer] se puede cultivar y Physiological scientist para Hydrographic surveyor bacterias. Cmo se trata? El tratamiento de esta afeccin puede incluir lo siguiente:  Aplicar compresas calientes o fras en la zona afectada.  Tomar analgsicos.  Tomar antibiticos para combatir la infeccin bacteriana. En general, los antibiticos se toman por boca.  Cuidarse, descansando y [employer] de lquido.  Si se ha formado un absceso, se puede tratar extrayendo el lquido con [place] Caul. En ocasiones, la mastitis que se produce debido al amamantamiento mejora sin tratamiento; por lo tanto, el mdico podr indicarle que espere 24 horas despus de verla por primera vez para decidir si necesita recetarle un medicamento. Es posible que le informen sobre las distintas maneras de ayudar a Radio broadcast assistant Transport planner, como continuar amamantando o extraer [place] para garantizar un flujo de [place]. Siga estas indicaciones en su casa: Medicamentos  [employer] de venta libre y los recetados solamente como se lo haya indicado el mdico.  Si le recetaron un antibitico, tmelo como se lo haya indicado el mdico. No deje de tomar los antibiticos aunque comience a Sports administrator. Instrucciones generales  No use un sostn demasiado ajustado o con aro. Use un sostn blando, de soporte.  Aumente la ingesta de lquidos, especialmente si tiene fiebre.  Descanse lo suficiente. Si est amamantando:  Contine vaciando las mamas con la mayor frecuencia posible ya sea amamantando o usando un Medical illustrator. As, disminuir la presin y Conservation officer, historic buildings que [place]. Pregunte a su mdico si se deben hacer cambios en su rutina de amamantamiento o extraccin de [place].  Mantenga los  pezones secos y limpios.  Durante el amamantamiento, vace la primera mama completamente antes de amamantar con la segunda. Si el beb no vaca la mama en su totalidad, utilice un sacaleche para  vaciarla.  Realice un masaje en las mamas [place].  Si se le indica, aplique calor hmedo en la zona afectada de la mama justo antes de Economist o de Printmaker. Use la fuente de calor que el mdico le recomiende.  Si se le indica, pngase hielo en la zona afectada de la mama, inmediatamente despus de Economist o extraer leche: ? Ponga el hielo en una bolsa plstica. ? Coloque una [employer] piel y la bolsa de hielo. ? Deje el hielo en el lugar durante 20 minutos.  Si debe regresar a Fish farm manager, use un sacaleche mientras est en el trabajo para mantener su cronograma de Transport planner.  Evite que las mamas se llenen mucho de [place] (congestin). Comunquese con un mdico si:  Tiene una secrecin similar a pus por la mama.  Tiene fiebre.  Los sntomas no mejoran en el trmino de 2 das despus de iniciar el tratamiento. Solicite ayuda de inmediato si:  El dolor y Financial trader.  Tiene dolor y no puede controlarlo con Conservation officer, nature.  Observa una lnea roja que se extiende desde la mama hasta la axila. Resumen  La mastitis es una inflamacin del tejido [place]. Ocurre con mayor frecuencia en las mujeres que [place], pero tambin puede afectar a otras mujeres que no [place] y a Curator.  Generalmente, la causa de esta afeccin es una infeccin bacteriana.  Esta afeccin se puede tratar con compresas calientes o fras, medicamentos, cuidado personal y determinadas estrategias de lactancia.  Si le recetaron un antibitico, tmelo como se lo haya indicado el mdico. No deje de tomar los antibiticos aunque comience a Sports administrator. Esta informacin no tiene Marine scientist el consejo del mdico. Asegrese de hacerle al mdico cualquier pregunta que tenga. Document Released: 12/11/2004 Document Revised: 10/20/2016 Document Reviewed: 10/01/2012 Elsevier Patient Education  2020 [employer].

## 2019-01-25 NOTE — Telephone Encounter (Signed)
Daughter called reporting that her mothers right breast is red and hot since yesterday, this is the breast she has cancer in and she was asking to be seen. Appointment scheduled for 130 with [facility] NP and accepted. Interpreter requested ordered.

## 2019-01-25 NOTE — Progress Notes (Signed)
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 High risk MammaPrint. Patient had lumpectomy on August 28, 2016 followed by MammoSite radiation therapy. Given her high risk MammaPrint, patient received adjuvant chemotherapy with Taxotere and Cytoxan.  She completed cycle 4 of 4 on December 24, 2016.  She abruptly transferred care to [facility] and was initiated on aromatase inhibitor.  Anastrozole was discontinued secondary to side effects.  She is now on letrozole and will complete 5 years of treatment in 2023.  Given her high risk MammaPrint may consider extending treatment.  Her most recent mammogram on August 16, 2018 was reported as BI-RADS 3, repeat in 6 months.    Interval history- Wanda Reed, 63 year old female with above history of stage Ia breast cancer currently on letrozole, presents to symptom management clinic for reports of right breast pain.  Symptoms started yesterday and have progressed since that time.  Describes as hot sensation, swollen, tender, erythematous right breast.  Nothing seems to make symptoms better or worse.  She has not tried anything for their symptoms.  Has history of right breast infection in 2018 and was treated with Augmentin.  She says that she is currently having  trouble with recurrent urinary tract infections and is scheduled to see her primary care provider for another possible infection later today.  She says she has been on several antibiotics lately but cannot remember the names.  No antibiotic use in last week.  She denies fever or chills.  Denies any new breast masses.  Denies nipple discharge.  Review of systems- Review of Systems  Constitutional: Negative for chills, fever and malaise/fatigue.  Respiratory: Negative for cough and shortness of breath.   Cardiovascular: Negative for chest pain and palpitations.  Gastrointestinal: Negative for abdominal pain, constipation, diarrhea and nausea.  Genitourinary: Positive for dysuria, frequency and urgency. Negative for flank pain and hematuria.  Skin:       Right breast redness per hpi  Neurological: Negative for dizziness and weakness.  Psychiatric/Behavioral: Negative for depression. The patient is not nervous/anxious.      No Known Allergies  Past Medical History:  Diagnosis Date  . Anxiety   . Breast cancer ([place]) 07/2016   right breast lumpectomy with chemo and mammosite  . Breast cancer of upper-outer quadrant of right female breast ([place]) 08/2016   1.0 cm ER+, PR-, Her 2 neu not overexpressed. Node negative. T1b, N0.  [name] Cancer (HCC)    cervical  . Hypertension   . Hypothyroidism   . Personal history of chemotherapy 11/2016-12/2016   right breast ca  . Personal history of radiation therapy 09/15/2016   mammosite  . Thyroid disease     Past Surgical History:  Procedure Laterality Date  . ABDOMINAL HYSTERECTOMY    . BREAST BIOPSY Right 08/08/2016   invasive mammary carcinoma  . BREAST LUMPECTOMY Right 08/28/2016  invasive mammary carcinoma, clear margins, negative lymph nodes  . BREAST LUMPECTOMY WITH SENTINEL LYMPH NODE BIOPSY Right 08/28/2016   Procedure: BREAST LUMPECTOMY WITH SENTINEL LYMPH NODE BX;  Surgeon: [name], MD;  Location: [facility] ORS;  Service: General;   Laterality: Right;  . BREAST MAMMOSITE Right 09/15/2016   Procedure: MAMMOSITE BREAST;  Surgeon: [name], MD;  Location: [facility] ORS;  Service: General;  Laterality: Right;  . BREAST SURGERY Right    Breast Biopsy  . CERVIX SURGERY    . COLONOSCOPY  2008  . EYE SURGERY Bilateral    Cataract Extraction with IOL  . PORTA CATH INSERTION Right 11/20/2016   Procedure: PORTA CATH INSERTION;  Surgeon: [name], MD;  Location: [facility] ORS;  Service: General;  Laterality: Right;  . SHOULDER ARTHROSCOPY Right     Social History   Socioeconomic History  . Marital status: Single    Spouse name: Not on file  . Number of children: Not on file  . Years of education: Not on file  . Highest education level: Not on file  Occupational History  . Not on file  Social Needs  . Financial resource strain: Not on file  . Food insecurity    Worry: Not on file    Inability: Not on file  . Transportation needs    Medical: Not on file    Non-medical: Not on file  Tobacco Use  . Smoking status: Never Smoker  . Smokeless tobacco: Never Used  Substance and Sexual Activity  . Alcohol use: No  . Drug use: No  . Sexual activity: Not on file  Lifestyle  . Physical activity    Days per week: Not on file    Minutes per session: Not on file  . Stress: Not on file  Relationships  . Social Herbalist on phone: Not on file    Gets together: Not on file    Attends religious service: Not on file    Active member of club or organization: Not on file    Attends meetings of clubs or organizations: Not on file    Relationship status: Not on file  . Intimate partner violence    Fear of current or ex partner: Not on file    Emotionally abused: Not on file    Physically abused: Not on file    Forced sexual activity: Not on file  Other Topics Concern  . Not on file  Social History Narrative  . Not on file    Family History  Problem Relation Age of Onset  . Breast cancer Neg Hx      Current Outpatient Medications:  .  acyclovir (ZOVIRAX) 400 MG tablet, Take 400 mg by mouth 3 (three) times daily as needed (cold sore outbreaks). , Disp: , Rfl:  .  letrozole (FEMARA) 2.5 MG tablet, Take 1 tablet (2.5 mg total) by mouth daily., Disp: 90 tablet, Rfl: 0 .  levothyroxine (SYNTHROID, LEVOTHROID) 100 MCG tablet, Take 100 mcg by mouth daily before breakfast., Disp: , Rfl:  .  lisinopril (PRINIVIL,ZESTRIL) 10 MG tablet, Take 10 mg by mouth daily., Disp: , Rfl:  .  Multiple Vitamin (MULTIVITAMIN WITH MINERALS) TABS tablet, Take 1 tablet by mouth daily., Disp: , Rfl:   Physical exam:  Vitals:   01/25/19 1345  BP: (!) 174/75  Pulse: 85  Resp: 18  Temp: 98.3 F (36.8 C)  TempSrc: Tympanic  Weight: 159 lb (72.1 kg)   Physical Exam  Constitutional:      General: She is not in acute distress.    Comments: Unaccompanied.  Wearing mask.  HENT:     Head: Normocephalic and atraumatic.  Eyes:     General: No scleral icterus.    Conjunctiva/sclera: Conjunctivae normal.  Cardiovascular:     Rate and Rhythm: Normal rate and regular rhythm.  Pulmonary:     Effort: Pulmonary effort is normal.     Breath sounds: Normal breath sounds.  Chest:     Breasts:        Right: Tenderness present. No inverted nipple or nipple discharge.        Left: No swelling, skin change or tenderness.    Abdominal:     General: There is no distension.     Palpations: Abdomen is soft.     Tenderness: There is no abdominal tenderness. There is no right CVA tenderness or left CVA tenderness.  Lymphadenopathy:     Cervical: No cervical adenopathy.     Upper Body:     Right upper body: No supraclavicular or axillary adenopathy.     Left upper body: No supraclavicular or axillary adenopathy.  Skin:    General: Skin is warm and dry.  Neurological:     Mental Status: She is alert and oriented to person, place, and time.  Psychiatric:        Mood and Affect: Mood normal.        Behavior: Behavior  normal.     CMP Latest Ref Rng & Units 01/01/2017  Glucose 65 - 99 mg/dL 125(H)  BUN 6 - 20 mg/dL 5(L)  Creatinine 0.44 - 1.00 mg/dL 0.55  Sodium 135 - 145 mmol/L 137  Potassium 3.5 - 5.1 mmol/L 3.3(L)  Chloride 101 - 111 mmol/L 100(L)  CO2 22 - 32 mmol/L 28  Calcium 8.9 - 10.3 mg/dL 8.9  Total Protein 6.5 - 8.1 g/dL 6.2(L)  Total Bilirubin 0.3 - 1.2 mg/dL 0.4  Alkaline Phos 38 - 126 U/L 104  AST 15 - 41 U/L 38  ALT 14 - 54 U/L 13(L)   CBC Latest Ref Rng & Units 01/01/2017  WBC 3.6 - 11.0 K/uL 23.2(H)  Hemoglobin 12.0 - 16.0 g/dL 10.3(L)  Hematocrit 35.0 - 47.0 % 30.7(L)  Platelets 150 - 440 K/uL 113(L)    No images are attached to the encounter.  No results found.  Assessment and plan- Patient is a 63 y.o. female with history of stage Ia ER positive right breast cancer status post lumpectomy, MammoSite radiation, adjuvant chemotherapy, currently on letrozole, who presents to symptom management clinic for  1. Acute Right Mastitis -history of mastitis in 2018 treated with Augmentin.   Start doxycycline 100 mg twice daily for 7 days.  Advised patient that if symptoms do not improve for her to return to clinic for reevaluation.  Would consider mammogram at that time to evaluate for possible inflammatory breast cancer.  2.  Recurrent urinary tract infections-patient scheduled to see her PCP later today for evaluation of UTI-like symptoms.  I attempted to contact Dr. Posey Pronto to discuss her recent symptoms and recent antibiotic use but no answer at clinic. Dr. Posey Pronto may need to change antibiotics for coverage for both UTI and mastitis. Suspect patient may be experiencing recurrent infections secondary to antiestrogen effects of letrozole.  Given ER positivity of her breast cancer would not recommend topical estrogens. She mentioned some chronic vaginal dryness as we discussed antiestrogen effects of letrozole.  Again, would not recommend topical  estrogens but could consider vaginal  moisturizers.  She could also return to symptom management clinic for evaluation and further management.    Disposition: Return to clinic if symptoms do not improve or worsen.   Visit Diagnosis 1. Mastitis, right, acute   2. Recurrent UTI    Patient expressed understanding and was in agreement with this plan. She also understands that She can call clinic at any time with any questions, concerns, or complaints.   Due to a language barrier, a Spanish language interpreter, Ronnald Collum, was present for and participated in all patient interactions  A total of (40) minutes of face-to-face time was spent with this patient with greater than 50% of that time in counseling and care-coordination.   Thank you for allowing me to participate in the care of this very pleasant patient.   Beckey Rutter, DNP, AGNP-C [facility] (work cell) [phone] (office)  CC: Dr. Posey Pronto

## 2019-01-31 ENCOUNTER — Other Ambulatory Visit: Payer: Self-pay | Admitting: Nurse Practitioner

## 2019-01-31 MED ORDER — DOXYCYCLINE HYCLATE 100 MG PO TABS: 100.0000 mg | ORAL_TABLET | Freq: Two times a day (BID) | ORAL | 0 refills | Status: AC

## 2019-02-17 ENCOUNTER — Ambulatory Visit
Admission: RE | Admit: 2019-02-17 | Discharge: 2019-02-17 | Disposition: A | Discharge status: Home / Self Care | Payer: Medicaid Other | Source: Ambulatory Visit | Attending: Oncology | Admitting: Oncology

## 2019-02-17 DIAGNOSIS — R921 Mammographic calcification found on diagnostic imaging of breast: Secondary | ICD-10-CM | POA: Insufficient documentation

## 2019-02-17 DIAGNOSIS — R928 Other abnormal and inconclusive findings on diagnostic imaging of breast: Secondary | ICD-10-CM | POA: Diagnosis not present

## 2019-02-21 ENCOUNTER — Other Ambulatory Visit: Payer: Self-pay

## 2019-02-21 DIAGNOSIS — R300 Dysuria: Secondary | ICD-10-CM

## 2019-02-22 ENCOUNTER — Other Ambulatory Visit
Admission: RE | Admit: 2019-02-22 | Discharge: 2019-02-22 | Disposition: A | Discharge status: Home / Self Care | Payer: Medicaid Other | Source: Ambulatory Visit | Attending: Urology | Admitting: Urology

## 2019-02-22 ENCOUNTER — Other Ambulatory Visit: Payer: Self-pay

## 2019-02-22 ENCOUNTER — Ambulatory Visit (INDEPENDENT_AMBULATORY_CARE_PROVIDER_SITE_OTHER): Payer: Medicaid Other | Admitting: Urology

## 2019-02-22 ENCOUNTER — Encounter: Payer: Self-pay | Admitting: Urology

## 2019-02-22 VITALS — BP 140/71 | HR 78 | Ht 60.0 in | Wt 159.0 lb

## 2019-02-22 DIAGNOSIS — R300 Dysuria: Secondary | ICD-10-CM | POA: Diagnosis not present

## 2019-02-22 DIAGNOSIS — N941 Unspecified dyspareunia: Secondary | ICD-10-CM

## 2019-02-22 DIAGNOSIS — N3281 Overactive bladder: Secondary | ICD-10-CM | POA: Diagnosis not present

## 2019-02-22 DIAGNOSIS — Z789 Other specified health status: Secondary | ICD-10-CM | POA: Diagnosis present

## 2019-02-22 LAB — URINALYSIS, COMPLETE (UACMP) WITH MICROSCOPIC
Bacteria, UA: NONE SEEN
Bilirubin Urine: NEGATIVE
Glucose, UA: NEGATIVE mg/dL
Hgb urine dipstick: NEGATIVE
Ketones, ur: NEGATIVE mg/dL
Leukocytes,Ua: NEGATIVE
Nitrite: NEGATIVE
Protein, ur: NEGATIVE mg/dL
RBC / HPF: NONE SEEN RBC/hpf (ref 0–5)
Specific Gravity, Urine: <1.005 — ABNORMAL LOW (ref 1.005–1.030)
Squamous Epithelial / HPF: NONE SEEN (ref 0–5)
pH: 6.5 (ref 5.0–8.0)

## 2019-02-22 MED ORDER — OXYBUTYNIN CHLORIDE ER 10 MG PO TB24: 10.0000 mg | ORAL_TABLET | Freq: Every day | ORAL | 11 refills | Status: DC

## 2019-02-22 NOTE — Progress Notes (Signed)
02/22/19 2:16 PM   [name] [birth year] [id number]  Referring provider: Denton Lank, MD [address] [place],  [place] [address]  CC: Dysuria, pelvic pain  HPI: I saw [name] in urology clinic in consultation for dysuria and pelvic pain from Dr. Posey Pronto.  She is a 63 year old Spanish-speaking female, and today's visit was conducted via Marketing executive.  Her past medical history is notable for cervical cancer treated with hysterectomy 20 years ago, and breast cancer 2 years ago treated with lumpectomy, radiation, and chemotherapy.  She denies any history of pelvic radiation.  She reports 2 to 3 years of pelvic pain and dysuria despite negative urine cultures.  She also reports stress incontinence and leakage with coughing and sneezing, as well as urinary urgency, frequency, urge incontinence, and nocturia 5-6 times per night.  She also has painful intercourse, and dysuria after sex.  She has never smoked and denies any other carcinogenic exposures.  She does not notice that any foods or drinks make her urinary symptoms worse.  She has 1 cup of coffee in the morning, then drinks just water during the day.  Urinalysis today is benign with 0-5 WBCs, 0 RBCs, no bacteria, nitrite negative, no leukocytes.  PMH: Past Medical History:  Diagnosis Date  . Anxiety   . Breast cancer ([place]) 07/2016   right breast lumpectomy with chemo and mammosite  . Breast cancer of upper-outer quadrant of right female breast ([place]) 08/2016   1.0 cm ER+, PR-, Her 2 neu not overexpressed. Node negative. T1b, N0.  [name] Cancer (HCC)    cervical  . Hypertension   . Hypothyroidism   . Personal history of chemotherapy 11/2016-12/2016   right breast ca  . Personal history of radiation therapy 09/15/2016   mammosite  . Thyroid disease     Surgical History: Past Surgical History:  Procedure Laterality Date  . ABDOMINAL HYSTERECTOMY    . BREAST BIOPSY Right 08/08/2016   invasive mammary carcinoma   . BREAST LUMPECTOMY Right 08/28/2016   invasive mammary carcinoma, clear margins, negative lymph nodes  . BREAST LUMPECTOMY WITH SENTINEL LYMPH NODE BIOPSY Right 08/28/2016   Procedure: BREAST LUMPECTOMY WITH SENTINEL LYMPH NODE BX;  Surgeon: [name], MD;  Location: [facility]S;  Service: General;  Laterality: Right;  . BREAST MAMMOSITE Right 09/15/2016   Procedure: MAMMOSITE BREAST;  Surgeon: [name], MD;  Location: [facility];  Service: General;  Laterality: Right;  . BREAST SURGERY Right    Breast Biopsy  . CERVIX SURGERY    . COLONOSCOPY  2008  . EYE SURGERY Bilateral    Cataract Extraction with IOL  . PORTA CATH INSERTION Right 11/20/2016   Procedure: PORTA CATH INSERTION;  Surgeon: [name], MD;  Location: [facility];  Service: General;  Laterality: Right;  . SHOULDER ARTHROSCOPY Right     Allergies: No Known Allergies  Family History: Family History  Problem Relation Age of Onset  . Breast cancer Neg Hx     Social History:  reports that she has never smoked. She has never used smokeless tobacco. She reports that she does not drink alcohol or use drugs.  ROS: Please see flowsheet from today's date for complete review of systems.  Physical Exam: BP 140/71 (BP Location: Left Arm, Patient Position: Sitting, Cuff Size: Normal)   Pulse 78   Ht 5' (1.524 m)   Wt 159 lb (72.1 kg)   BMI 31.05 kg/m    Constitutional:  Alert and oriented,  No acute distress. Cardiovascular: No clubbing, cyanosis, or edema. Respiratory: Normal respiratory effort, no increased work of breathing. GI: Abdomen is soft, nontender, nondistended, no abdominal masses GU: Pelvic exam with patent urethral meatus, some subtle swelling at the inferior aspect of the urethral opening, periurethral tissue healthy appearing, no other lesions.  Distinct tenderness on urethral palpation. Lymph: No cervical or inguinal lymphadenopathy. Skin: No rashes, bruises or suspicious lesions.  Neurologic: Grossly intact, no focal deficits, moving all 4 extremities. Psychiatric: Normal mood and affect.  Laboratory Data: Reviewed, see HPI  Pertinent Imaging: None to review  Assessment & Plan:   In summary, the patient is a 63 year old female undergoing treatment for breast cancer who reports 2 to 3 years of urinary symptoms including overactive symptoms of urgency, frequency, urge incontinence, stress incontinence, and dysuria, dyspareunia, and pelvic discomfort.  We discussed the complex etiology of her symptoms and her mixed presentation.  I recommended a trial of oxybutynin for her overactive symptoms of urgency, frequency, and urge incontinence.  I also recommended a pelvic MRI to evaluate for urethral diverticulum with her symptoms of persistent dysuria despite multiple negative urinalyses and dyspareunia.  We reviewed her negative urinalysis today and that I do not think infection as a cause of her urinary symptoms.  Pelvic MRI to evaluate for urethral diverticulum, call with results Refer to Dr. Matilde Sprang pending MRI findings  A total of 60 minutes were spent face-to-face with the patient, greater than 50% was spent in patient education, counseling, and coordination of care regarding dysuria, overactive bladder, and possible urethral diverticulum.   [place]ley [place], [facility] [address] [place], [place] [address] [phone]

## 2019-02-22 NOTE — Patient Instructions (Signed)
Vejiga hiperactiva en adultos  Overactive Bladder, Adult    Vejiga hiperactiva se refiere a una afeccin en la que la persona tiene una necesidad sbita de orinar. La persona puede tener una prdida de orina si no puede llegar al bao con la rapidez suficiente (incontinencia urinaria). Una persona con esta afeccin tambin puede despertarse varias veces durante la noche para ir al bao.  La vejiga hiperactiva est asociada con seales nerviosas deficientes entre la vejiga y el cerebro. La vejiga puede recibir la seal de vaciarse antes de que est llena. Usted tambin puede tener msculos muy sensibles que hacen que la vejiga se contraiga demasiado pronto. Estos sntomas pueden interferir en el trabajo diario o las actividades sociales.  Cules son las causas?  Esta afeccin puede estar relacionada con, o ser provocada por:   Infeccin de las vas urinarias.   Infeccin de los tejidos cercanos, como la prstata.   Agrandamiento de la prstata.   Ciruga en el tero o la uretra.   Clculos en la vejiga, inflamacin o tumores.   Consumir cafena o alcohol en exceso.   Ciertos medicamentos, especialmente los medicamentos para eliminar el exceso de lquido del cuerpo (diurticos).   Debilidad de los msculos y nervios, especialmente a causa de lo siguiente:  ? Lesin en la mdula espinal.  ? Accidente cerebrovascular.  ? Esclerosis mltiple.  ? La enfermedad de Parkinson.   Diabetes.   Estreimiento.  Qu incrementa el riesgo?  Puede correr un mayor riesgo de desarrollar vejiga hiperactiva si usted:   Es un adulto mayor.   Fuma.   Est atravesando la menopausia.   Tiene problemas de prstata.   Tiene una enfermedad neurolgica, como accidente cerebrovascular, demencia, enfermedad de Parkinson o esclerosis mltiple (EM).   Ingiere alimentos o bebidas que irritan la vejiga. Entre ellos se incluyen el alcohol, los alimentos picantes y la cafena.   Tiene sobrepeso u obesidad.  Cules son los signos o  los sntomas?  Los sntomas de esta afeccin incluyen los siguientes:   Una urgencia repentina e intensa de orinar.   Prdida de orina.   Orina 8 o ms veces por da.   Levantarse para orinar 2 o ms veces por noche.  Cmo se diagnostica?  El mdico puede sospechar la presencia de vejiga hiperactiva en funcin de los sntomas que presente. El mdico diagnosticar esta afeccin mediante:   Un examen fsico y antecedentes mdicos.   Anlisis de sangre u orina. Es posible que necesite anlisis de orina o pruebas en la vejiga para ayudar a determinar cul es la causa de su vejiga hiperactiva.  Es posible que tambin tenga que consultar a un mdico especialista en enfermedades de las vas urinarias (urlogo).  Cmo se trata?  El tratamiento para el trastorno de vejiga hiperactiva depende de la causa y la gravedad de su enfermedad. Tambin puede hacer cambios en su estilo de vida en su casa. Entre las opciones se incluyen las siguientes:   Entrenamiento de la vejiga. Esto puede incluir lo siguiente:  ? Aprender a controlar la necesidad urgente de orinar siguiendo un programa que lo obliga a orinar en intervalos regulares (vaciamiento cronometrado).  ? Hacer ejercicios de Kegel para fortalecer los msculos del piso plvico que sostienen la vejiga. La tonificacin de estos msculos puede ayudarlo a controlar las micciones, aun si hay hiperactividad en los msculos de la vejiga.   Dispositivos especiales. Esto puede incluir lo siguiente:  ? Biorretroalimentacin, que utiliza sensores para ayudarlo a estar atento a   que calza en la vagina y sostiene la vejiga (pesario).  Medicamentos. ? Antibiticos para  tratar las infecciones en la vejiga. ? Antiespasmdicos para evitar que la vejiga elimine orina en el momento incorrecto. ? Antidepresivos tricclicos para relajar los msculos de la vejiga. ? Inyecciones de toxina botulnica tipo A directamente en el tejido de la vejiga para relajar los msculos de la vejiga.  Cambios en el estilo de vida. Esto puede incluir lo siguiente: ? Prdida de peso. Hable con su mdico sobre los mtodos para perder peso que funcionaran mejor para usted. ? Cambios en la dieta. Esto puede incluir la reduccin de la cantidad de alcohol y de cafena que consume, o beber lquidos en distintos momentos del da. ? No fumar. No consuma ningn producto que contenga nicotina o tabaco, como cigarrillos y Psychologist, sport and exercise. Si necesita ayuda para dejar de fumar, consulte al mdico.  [place]. ? Puede implantarse un dispositivo para ayudar a Chief Technology Officer las seales nerviosas que controlan la miccin. ? Puede implantarse un electrodo para estimular las seales elctricas en la vejiga. ? Puede realizarse un procedimiento para cambiar la forma de la vejiga. Esto solo se Merchandiser, retail graves. Siga estas indicaciones en su casa: Estilo de vida  Modifique su dieta o estilo de vida como se lo haya recomendado su mdico. Esto pueden incluir lo siguiente: ? Beba menos cantidad de lquido o beber lquido en distintos momentos del da. ? Reduzca la ingesta de cafena o alcohol. ? [employer] ejercicios de Kegel. ? Baje de peso, si es necesario. ? Ingiera una dieta saludable y equilibrada para English as a second language teacher estreimiento. Esto puede incluir lo siguiente:  Consuma alimentos ricos en fibra, como frutas y verduras frescas, cereales integrales y frijoles.  Limite el consumo de alimentos ricos en grasas y azcares procesados, como alimentos fritos o dulces. Instrucciones generales  [employer]phi de venta libre y los recetados solamente como se lo haya indicado el mdico.  Si  le recetaron un antibitico, tmelo como se lo haya indicado el mdico. No deje de tomar el antibitico aunque comience a sentirse mejor.  Use los implantes o el pesario como se lo haya indicado el mdico.  Si es necesario, use apsitos para Tax adviser cualquier prdida de orina que pueda [place].  Lleve un diario o libro de anotaciones para registrar la cantidad de lquidos que ingiere y cundo lo hace, y cundo siente necesidad de Garment/textile technologist. Esto ayudar a su mdico a Retail buyer.  Concurra a todas las visitas de control como se lo haya indicado el mdico. Esto es importante. Comunquese con un mdico si:  Tiene fiebre.  Los sntomas no mejoran con Dispensing optician.  El dolor y [place].  Tiene necesidad urgente de orinar con mayor frecuencia. Solicite ayuda de inmediato si:  No puede controlar la vejiga. Resumen  Vejiga hiperactiva se refiere a Engineering geologist en la que la persona tiene una necesidad sbita de Garment/textile technologist.  Varias afecciones pueden causar sntomas de vejiga hiperactiva.  El tratamiento para la vejiga hiperactiva depende de la causa y la gravedad de la afeccin.  Siga las instrucciones del mdico acerca de hacer cambios en el estilo de vida, hacer los ejercicios de Kegel, llevar un diario y tomar los medicamentos. Esta informacin no tiene Marine scientist el consejo del mdico. Asegrese de hacerle al mdico cualquier pregunta que tenga. Document Released: 02/18/2012 Document Revised: 05/13/2017 Document Reviewed: 05/13/2017 Elsevier Patient Education  2020 [employer].

## 2019-02-25 ENCOUNTER — Other Ambulatory Visit: Payer: Self-pay

## 2019-02-25 NOTE — Progress Notes (Signed)
Patient non english speaking daughter supplied information for chart.

## 2019-02-27 NOTE — Progress Notes (Signed)
  [place]  [phone] [phone]) [phone]  ID: [name] OB: [birth year]  MR#: [id number]  [id number]    Lloyd Huger, MD   02/27/2019 9:16 AM     This encounter was created in error - please disregard.

## 2019-02-28 ENCOUNTER — Inpatient Hospital Stay: Payer: Medicaid Other | Admitting: Oncology

## 2019-03-01 ENCOUNTER — Telehealth: Payer: Self-pay | Admitting: Urology

## 2019-03-01 NOTE — Telephone Encounter (Signed)
[place] will not show a diverticulum, can you set up a conversation for a peer to peer?  Thanks Nickolas Madrid, MD 03/01/2019

## 2019-03-01 NOTE — Telephone Encounter (Signed)
Patient's insurance denied her MRI because they said she needed to have a US of the pelvis first before they could approve an MRI.  Please advise   Wanda Reed

## 2019-03-02 ENCOUNTER — Ambulatory Visit: Payer: Self-pay | Admitting: Surgery

## 2019-03-07 ENCOUNTER — Ambulatory Visit (INDEPENDENT_AMBULATORY_CARE_PROVIDER_SITE_OTHER): Payer: Medicaid Other | Admitting: Surgery

## 2019-03-07 ENCOUNTER — Other Ambulatory Visit: Payer: Self-pay

## 2019-03-07 VITALS — BP 164/77 | HR 79 | Temp 97.7°F | Resp 12 | Ht 60.0 in | Wt 161.2 lb

## 2019-03-07 DIAGNOSIS — N611 Abscess of the breast and nipple: Secondary | ICD-10-CM | POA: Diagnosis not present

## 2019-03-07 MED ORDER — SULFAMETHOXAZOLE-TRIMETHOPRIM 400-80 MG PO TABS: 1.0000 | ORAL_TABLET | Freq: Two times a day (BID) | ORAL | 0 refills | Status: AC

## 2019-03-07 MED ORDER — FLUCONAZOLE 100 MG PO TABS: 100.0000 mg | ORAL_TABLET | Freq: Every day | ORAL | 0 refills | Status: AC

## 2019-03-07 NOTE — Patient Instructions (Signed)
Patient was prescribed Bactrim and Diflucan at today's visit to patient's local pharmacy.

## 2019-03-09 ENCOUNTER — Encounter: Payer: Self-pay | Admitting: Surgery

## 2019-03-09 NOTE — Progress Notes (Signed)
Outpatient Surgical Follow Up  03/09/2019  Wanda Reed is an 63 y.o. female.   Chief Complaint  Patient presents with  . Follow-up    Uni R diag mammorgram     HPI: Wanda Reed is a 63 year old female with a history of left breast lumpectomy as well as axillary radiation.  She did  surgery by Dr. Bary Castilla on June 2018.  SHe did  receive chemotherapy as well.  She comes back for pain right breast, sharp moderate, recently given a course of antibiotics for mastitis./ She denies any fevers and chills no lumps or bumps.  .  Mammogram personally reviewed showing evidence of some calcifications on the right breast.  No discrete masses  Past Medical History:  Diagnosis Date  . Anxiety   . Breast cancer ([place]) 07/2016   right breast lumpectomy with chemo and mammosite  . Breast cancer of upper-outer quadrant of right female breast ([place]) 08/2016   1.0 cm ER+, PR-, Her 2 neu not overexpressed. Node negative. T1b, N0.  [name] Cancer (HCC)    cervical  . Hypertension   . Hypothyroidism   . Personal history of chemotherapy 11/2016-12/2016   right breast ca  . Personal history of radiation therapy 09/15/2016   mammosite  . Thyroid disease     Past Surgical History:  Procedure Laterality Date  . ABDOMINAL HYSTERECTOMY    . BREAST BIOPSY Right 08/08/2016   invasive mammary carcinoma  . BREAST LUMPECTOMY Right 08/28/2016   invasive mammary carcinoma, clear margins, negative lymph nodes  . BREAST LUMPECTOMY WITH SENTINEL LYMPH NODE BIOPSY Right 08/28/2016   Procedure: BREAST LUMPECTOMY WITH SENTINEL LYMPH NODE BX;  Surgeon: [name], MD;  Location: [facility];  Service: General;  Laterality: Right;  . BREAST MAMMOSITE Right 09/15/2016   Procedure: MAMMOSITE BREAST;  Surgeon: [name], MD;  Location: [facility];  Service: General;  Laterality: Right;  . BREAST SURGERY Right    Breast Biopsy  . CERVIX SURGERY    . COLONOSCOPY  2008  . EYE SURGERY Bilateral    Cataract  Extraction with IOL  . PORTA CATH INSERTION Right 11/20/2016   Procedure: PORTA CATH INSERTION;  Surgeon: [name], MD;  Location: [facility];  Service: General;  Laterality: Right;  . SHOULDER ARTHROSCOPY Right     Family History  Problem Relation Age of Onset  . Breast cancer Neg Hx     Social History:  reports that she has never smoked. She has never used smokeless tobacco. She reports that she does not drink alcohol or use drugs.  Allergies: No Known Allergies  Medications reviewed.    ROS Full ROS performed and is otherwise negative other than what is stated in HPI   BP (!) 164/77   Pulse 79   Temp 97.7 F (36.5 C) (Temporal)   Resp 12   Ht 5' (1.524 m)   Wt 161 lb 3.2 oz (73.1 kg)   SpO2 98%   BMI 31.48 kg/m   Physical Exam Vitals and nursing note reviewed. Exam conducted with a chaperone present.  Constitutional:      General: She is not in acute distress.    Appearance: Normal appearance.  Eyes:     General: No scleral icterus.       Right eye: No discharge.        Left eye: No discharge.  Cardiovascular:     Heart sounds: No murmur.  Pulmonary:     Effort: Pulmonary effort is normal.  No respiratory distress.     Breath sounds: No wheezing.     Comments: BREAST: there is an area of fluctuance and tenderness w erythema right breast c/w abscess Abdominal:     General: Abdomen is flat.     Tenderness: There is no abdominal tenderness.  Musculoskeletal:     Cervical back: Normal range of motion and neck supple. No rigidity or tenderness.  Neurological:     Mental Status: She is alert.  Psychiatric:        Mood and Affect: Mood normal.        Behavior: Behavior normal.        Thought Content: Thought content normal.        Judgment: Judgment normal.        Assessment/Plan: 63 year old female with prior history of left breast cancer now with a new area of small complex abscess on the right breast.  Discussed with the patient in detail.   Procedure discussed with her risk-benefit and possible complications I do recommend to proceed with I&D.  Procedure  Note  Dx: right breast abscess 9 o'clock  Procedure: Incision and drainage of complex right breast abscess  EBL; minimal  Anesthesia: pt prefers not to have local  After informed count was obtained patient with patient was prepped and draped in usual fashion.  We used an 11 blade knife and about 2 cc of pus was drained.  Hemostat was used to break down loculations.  Quarter inch packing was placed.  Patient tolerated procedure well.  Greater than 50% of the 25 minutes  visit was spent in counseling/coordination of care   [name], MD [place] Surgeon

## 2019-03-21 ENCOUNTER — Encounter: Payer: Self-pay | Admitting: Surgery

## 2019-03-21 ENCOUNTER — Ambulatory Visit (INDEPENDENT_AMBULATORY_CARE_PROVIDER_SITE_OTHER): Payer: Self-pay | Admitting: Surgery

## 2019-03-21 ENCOUNTER — Other Ambulatory Visit: Payer: Self-pay

## 2019-03-21 VITALS — BP 147/78 | HR 82 | Temp 97.9°F | Ht 60.0 in | Wt 159.0 lb

## 2019-03-21 DIAGNOSIS — Z09 Encounter for follow-up examination after completed treatment for conditions other than malignant neoplasm: Secondary | ICD-10-CM

## 2019-03-21 MED ORDER — SULFAMETHOXAZOLE-TRIMETHOPRIM 800-160 MG PO TABS: 1.0000 | ORAL_TABLET | Freq: Two times a day (BID) | ORAL | 0 refills | Status: AC

## 2019-03-21 NOTE — Progress Notes (Signed)
Outpatient Surgical Follow Up  03/21/2019  Wanda Reed is an 64 y.o. female.   Chief Complaint  Patient presents with  . Follow-up    HPI:  [name] is a 64 year old female with a history of left breast lumpectomy as well as axillary radiation. She did have surgery by Dr. Bary Castilla on June 2018. A few weeks ago I did I/D of right breast abscess, improved but ove the last couple of days has began having pain again. No fever or chills    Past Medical History:  Diagnosis Date  . Anxiety   . Breast cancer ([place]) 07/2016   right breast lumpectomy with chemo and mammosite  . Breast cancer of upper-outer quadrant of right female breast ([place]) 08/2016   1.0 cm ER+, PR-, Her 2 neu not overexpressed. Node negative. T1b, N0.  [name] Cancer (HCC)    cervical  . Hypertension   . Hypothyroidism   . Personal history of chemotherapy 11/2016-12/2016   right breast ca  . Personal history of radiation therapy 09/15/2016   mammosite  . Thyroid disease     Past Surgical History:  Procedure Laterality Date  . ABDOMINAL HYSTERECTOMY    . BREAST BIOPSY Right 08/08/2016   invasive mammary carcinoma  . BREAST LUMPECTOMY Right 08/28/2016   invasive mammary carcinoma, clear margins, negative lymph nodes  . BREAST LUMPECTOMY WITH SENTINEL LYMPH NODE BIOPSY Right 08/28/2016   Procedure: BREAST LUMPECTOMY WITH SENTINEL LYMPH NODE BX;  Surgeon: [name], MD;  Location: [facility];  Service: General;  Laterality: Right;  . BREAST MAMMOSITE Right 09/15/2016   Procedure: MAMMOSITE BREAST;  Surgeon: [name], MD;  Location: [facility];  Service: General;  Laterality: Right;  . BREAST SURGERY Right    Breast Biopsy  . CERVIX SURGERY    . COLONOSCOPY  2008  . EYE SURGERY Bilateral    Cataract Extraction with IOL  . PORTA CATH INSERTION Right 11/20/2016   Procedure: PORTA CATH INSERTION;  Surgeon: [name], MD;  Location: [facility];  Service: General;  Laterality: Right;  .  SHOULDER ARTHROSCOPY Right     Family History  Problem Relation Age of Onset  . Breast cancer Neg Hx     Social History:  reports that she has never smoked. She has never used smokeless tobacco. She reports that she does not drink alcohol or use drugs.  Allergies: No Known Allergies  Medications reviewed.    ROS Full ROS performed and is otherwise negative other than what is stated in HPI   BP (!) 147/78   Pulse 82   Temp 97.9 F (36.6 C)   Ht 5' (1.524 m)   Wt 159 lb (72.1 kg)   SpO2 98%   BMI 31.05 kg/m   Physical Exam  NAD, alert Breast: there is induration on the right breast, tender to palpation but no discrete collections. Previous I/D site has closed.    Assessment/Plan: Persistent induration and mastitis, no evidence of abscess. D/W the pt in detail and we will give her another round of antibiotics, no definitive collection on PE. She is behaving like a chronic granulomatous mastitis.  Pt ok w taking OTC tylenol for pain   [name], MD [facility] Surgeon

## 2019-03-21 NOTE — Patient Instructions (Addendum)
llenar y completar sus antibiticos   seguimiento aqu en 2 semanas

## 2019-04-04 ENCOUNTER — Encounter: Payer: Self-pay | Admitting: Surgery

## 2019-04-04 ENCOUNTER — Other Ambulatory Visit: Payer: Self-pay

## 2019-04-04 ENCOUNTER — Telehealth: Payer: Self-pay | Admitting: Emergency Medicine

## 2019-04-04 ENCOUNTER — Ambulatory Visit (INDEPENDENT_AMBULATORY_CARE_PROVIDER_SITE_OTHER): Payer: Medicaid Other | Admitting: Surgery

## 2019-04-04 VITALS — BP 144/76 | HR 74 | Temp 97.7°F | Resp 12 | Ht 60.0 in | Wt 157.8 lb

## 2019-04-04 DIAGNOSIS — N611 Abscess of the breast and nipple: Secondary | ICD-10-CM | POA: Diagnosis not present

## 2019-04-04 NOTE — Telephone Encounter (Signed)
Mom asked to call daughter with [place] and office visit appointment due to language barrier.  Made daughter Lenna Sciara) aware of following appointments:  Ultrasound: 05/06/19 at 2:20pm at the [facility]  Follow Up visit with Dr. Dahlia Byes: 06/06/19 at 1:30pm  Advised daughter if mom had any questions or concerns to call the office. Verbalized understanding.

## 2019-04-04 NOTE — Patient Instructions (Addendum)
Programaremos su ultrasonido por usted.   La cita de su Ultrasonido sera el 47 de Febrero 2021 a las 2:20pm en [place].   Por favor llame a la oficina si tiene alguna pregunta o inquietud.

## 2019-04-06 ENCOUNTER — Encounter: Payer: Self-pay | Admitting: Surgery

## 2019-04-06 NOTE — Progress Notes (Signed)
Outpatient Surgical Follow Up  04/06/2019  Wanda Reed is an 64 y.o. female.   Chief Complaint  Patient presents with  . Follow-up    R Breast abscess     HPI: Wanda Reed is a 64 year old female with a history of left breast lumpectomy as well as  radiation. She did have surgery by Dr. Bary Castilla on June 2018. More recently she developed a small abscess and was treated appropriately with IV antibiotics.  She feels much better.  There is still some persistent induration likely to radiation changes.  No fevers no chills.  Past Medical History:  Diagnosis Date  . Anxiety   . Breast cancer ([place]) 07/2016   right breast lumpectomy with chemo and mammosite  . Breast cancer of upper-outer quadrant of right female breast ([place]) 08/2016   1.0 cm ER+, PR-, Her 2 neu not overexpressed. Node negative. T1b, N0.  [name] Cancer (HCC)    cervical  . Hypertension   . Hypothyroidism   . Personal history of chemotherapy 11/2016-12/2016   right breast ca  . Personal history of radiation therapy 09/15/2016   mammosite  . Thyroid disease     Past Surgical History:  Procedure Laterality Date  . ABDOMINAL HYSTERECTOMY    . BREAST BIOPSY Right 08/08/2016   invasive mammary carcinoma  . BREAST LUMPECTOMY Right 08/28/2016   invasive mammary carcinoma, clear margins, negative lymph nodes  . BREAST LUMPECTOMY WITH SENTINEL LYMPH NODE BIOPSY Right 08/28/2016   Procedure: BREAST LUMPECTOMY WITH SENTINEL LYMPH NODE BX;  Surgeon: [name], MD;  Location: [facility] ORS;  Service: General;  Laterality: Right;  . BREAST MAMMOSITE Right 09/15/2016   Procedure: MAMMOSITE BREAST;  Surgeon: [name], MD;  Location: [facility] ORS;  Service: General;  Laterality: Right;  . BREAST SURGERY Right    Breast Biopsy  . CERVIX SURGERY    . COLONOSCOPY  2008  . EYE SURGERY Bilateral    Cataract Extraction with IOL  . PORTA CATH INSERTION Right 11/20/2016   Procedure: PORTA CATH INSERTION;  Surgeon: [name], MD;  Location: [facility] ORS;  Service: General;  Laterality: Right;  . SHOULDER ARTHROSCOPY Right     Family History  Problem Relation Age of Onset  . Breast cancer Neg Hx     Social History:  reports that she has never smoked. She has never used smokeless tobacco. She reports that she does not drink alcohol or use drugs.  Allergies: No Known Allergies  Medications reviewed.    ROS Full ROS performed and is otherwise negative other than what is stated in HPI   BP (!) 144/76   Pulse 74   Temp 97.7 F (36.5 C) (Temporal)   Resp 12   Ht 5' (1.524 m)   Wt 157 lb 12.8 oz (71.6 kg)   SpO2 98%   BMI 30.82 kg/m   Physical Exam NAD alert Breast: Significant improvement.  There is no areas of erythema there is some induration related to radiation changes.  No abscess.  No new masses. No evidence of axillary lymphadenopathy     Assessment/Plan: 64 year old female with a history of breast cancer status post lumpectomy and recent abscess that has resolved.  We will continue annual mammograms and that she is due for another one on June 2021. No need for any further intervention at this time.  Greater than 50% of the 15 minutes  visit was spent in counseling/coordination of care   [name], MD [place] Surgeon

## 2019-04-25 ENCOUNTER — Other Ambulatory Visit: Payer: Self-pay | Admitting: Oncology

## 2019-05-06 ENCOUNTER — Ambulatory Visit
Admission: RE | Admit: 2019-05-06 | Discharge: 2019-05-06 | Disposition: A | Discharge status: Home / Self Care | Payer: Medicaid Other | Source: Ambulatory Visit | Attending: Surgery | Admitting: Surgery

## 2019-05-06 DIAGNOSIS — N611 Abscess of the breast and nipple: Secondary | ICD-10-CM | POA: Diagnosis present

## 2019-05-09 ENCOUNTER — Telehealth: Payer: Self-pay

## 2019-05-09 NOTE — Telephone Encounter (Signed)
-----   Message from Jules Husbands, MD sent at 05/09/2019  2:21 PM EST ----- Please let her know breast u/s did not show any concerning lesion ----- Message ----- From: Interface, Rad Results In Sent: 05/06/2019   5:41 PM EST To: Jules Husbands, MD

## 2019-05-09 NOTE — Telephone Encounter (Signed)
Left detailed message for patient to notify her of recent results per Dr.Pabon. Also advised to give our office a call if she has any questions or concerns. Patient is scheduled to follow up with Dr.Pabon on 06/13/19 at 1:30p.

## 2019-05-11 ENCOUNTER — Other Ambulatory Visit: Payer: Self-pay

## 2019-05-12 ENCOUNTER — Encounter: Payer: Self-pay | Admitting: Radiation Oncology

## 2019-05-12 ENCOUNTER — Other Ambulatory Visit: Payer: Self-pay

## 2019-05-12 ENCOUNTER — Ambulatory Visit
Admission: RE | Admit: 2019-05-12 | Discharge: 2019-05-12 | Disposition: A | Discharge status: Home / Self Care | Payer: Medicaid Other | Source: Ambulatory Visit | Attending: Radiation Oncology | Admitting: Radiation Oncology

## 2019-05-12 VITALS — BP 146/73 | HR 87 | Temp 97.4°F | Resp 16 | Wt 155.1 lb

## 2019-05-12 DIAGNOSIS — Z79811 Long term (current) use of aromatase inhibitors: Secondary | ICD-10-CM | POA: Diagnosis not present

## 2019-05-12 DIAGNOSIS — Z923 Personal history of irradiation: Secondary | ICD-10-CM | POA: Diagnosis not present

## 2019-05-12 DIAGNOSIS — Z170 Estrogen receptor positive status [ER+]: Secondary | ICD-10-CM | POA: Insufficient documentation

## 2019-05-12 DIAGNOSIS — C50411 Malignant neoplasm of upper-outer quadrant of right female breast: Secondary | ICD-10-CM | POA: Diagnosis present

## 2019-05-12 NOTE — Progress Notes (Signed)
Radiation Oncology Follow up Note  Name: Wanda Reed   Date:   05/12/2019 MRN:  [id number] DOB: [birth year]    This 64 y.o. female presents to the clinic today for 2 and half year follow-up status post accelerated partial breast radiation to right breast for stage I ER/PR positive invasive mammary carcinoma.  REFERRING PROVIDER: Denton Lank, MD  HPI: Patient is a 64 year old female now at 2-1/2 years having completed accelerated partial breast radiation.CT scan has been scheduled for May to her right breast for stage I ER/PR positive invasive mammary carcinoma.  She is seen today in routine follow-up is doing well she does have some only a fair cosmetic result with erythema of the skin still present.  She specifically denies breast tenderness cough or bone pain.  She had mammograms back in February which I have reviewed were BI-RADS 2 benign.  She is currently on Femara tolerating that well without side effect.  COMPLICATIONS OF TREATMENT: none  FOLLOW UP COMPLIANCE: keeps appointments   PHYSICAL EXAM:  BP (!) 146/73   Pulse 87   Temp (!) 97.4 F (36.3 C) (Tympanic)   Resp 16   Wt 155 lb 1.6 oz (70.4 kg)   BMI 30.29 kg/m  Right breast is somewhat contracted there is retraction of the scar towards the nipple and some erythema of the skin surrounding the nipple areolar complex.  No dominant masses noted in either breast in 2 positions examined no axillary or supraclavicular adenopathy is noted.  Well-developed well-nourished patient in NAD. HEENT reveals PERLA, EOMI, discs not visualized.  Oral cavity is clear. No oral mucosal lesions are identified. Neck is clear without evidence of cervical or supraclavicular adenopathy. Lungs are clear to A&P. Cardiac examination is essentially unremarkable with regular rate and rhythm without murmur rub or thrill. Abdomen is benign with no organomegaly or masses noted. Motor sensory and DTR levels are equal and symmetric in the upper and lower  extremities. Cranial nerves II through XII are grossly intact. Proprioception is intact. No peripheral adenopathy or edema is identified. No motor or sensory levels are noted. Crude visual fields are within normal range.  RADIOLOGY RESULTS: Mammograms reviewed compatible with above-stated findings  PLAN: Present time she continues to do well with no evidence of disease.  I am pleased with her overall progress.  I have asked to see her back in 1 year for follow-up.  She is already scheduled for follow-up mammograms.  She continues on Femara without side effect.  Patient knows to call with any concerns.    Noreene Filbert, MD

## 2019-05-22 NOTE — Progress Notes (Signed)
[place]  [phone] [phone]) [phone]  ID: Wanda Reed OB: [birth year]  MR#: [id number]  RJJ#:[id number]  Patient Care Team: Denton Lank, MD as PCP - General (Family Medicine) Rico Junker, RN as Registered Nurse Theodore Demark, RN as Registered Nurse Bary Castilla Forest Gleason, MD (General Surgery)   CHIEF COMPLAINT: Pathologic stage IA ER positive, PR/HER-2 negative, invasive carcinoma of the upper outer quadrant of the right breast. High risk MammaPrint.  INTERVAL HISTORY: Patient returns to clinic today for routine 47-monthevaluation.  She continues to have chronic arthritis, but her pain is well controlled with Tylenol.  She continues to have a right breast lump at the site of her surgery which is actively being evaluated and treated by surgery.  She otherwise feels well.  She continues to tolerate letrozole without significant side effects. She has no neurologic complaints.  She has a good appetite and denies weight loss.  She denies any chest pain, shortness of breath, cough, or hemoptysis.  She denies any nausea, vomiting, constipation, or diarrhea. She has no urinary complaints.  Patient offers no further specific complaints today.  REVIEW OF SYSTEMS:   Review of Systems  Constitutional: Negative.  Negative for fever, malaise/fatigue and weight loss.  Respiratory: Negative.  Negative for cough and shortness of breath.   Cardiovascular: Negative.  Negative for chest pain and leg swelling.  Gastrointestinal: Negative.  Negative for abdominal pain, nausea and vomiting.  Genitourinary: Negative.  Negative for dysuria.  Musculoskeletal: Positive for joint pain. Negative for back pain.  Skin: Negative.  Negative for rash.  Neurological: Negative.  Negative for dizziness, focal weakness, weakness and headaches.  Psychiatric/Behavioral: Negative.  The patient is not nervous/anxious.     As per HPI. Otherwise, a complete review of systems is  negative.  PAST MEDICAL HISTORY: Past Medical History:  Diagnosis Date  . Anxiety   . [place]   right breast lumpectomy with chemo and mammosite  . Breast cancer of upper-outer quadrant of right [place] breast [place] 08/2016   1.0 cm ER+, PR-, Her 2 neu not overexpressed. Node negative. T1b, N0.  .Marland KitchenCancer (HCC)    cervical  . Hypertension   . Hypothyroidism   . Personal history of chemotherapy 11/2016-12/2016   right breast ca  . Personal history of radiation therapy 09/15/2016   mammosite  . Thyroid disease     PAST SURGICAL HISTORY: Past Surgical History:  Procedure Laterality Date  . ABDOMINAL HYSTERECTOMY    . BREAST BIOPSY Right 08/08/2016   invasive mammary carcinoma  . BREAST LUMPECTOMY Right 08/28/2016   invasive mammary carcinoma, clear margins, negative lymph nodes  . BREAST LUMPECTOMY WITH SENTINEL LYMPH NODE BIOPSY Right 08/28/2016   Procedure: BREAST LUMPECTOMY WITH SENTINEL LYMPH NODE BX;  Surgeon: BRobert Bellow MD;  Location: [facility] OR[facility];  Service: General;  Laterality: Right;  . BREAST MAMMOSITE Right 09/15/2016   Procedure: MAMMOSITE BREAST;  Surgeon: BRobert Bellow MD;  Location: [facility] ORS;  Service: General;  Laterality: Right;  . BREAST SURGERY Right    Breast Biopsy  . CERVIX SURGERY    . COLONOSCOPY  2008  . EYE SURGERY Bilateral    Cataract Extraction with IOL  . PORTA CATH INSERTION Right 11/20/2016   Procedure: PORTA CATH INSERTION;  Surgeon: BRobert Bellow MD;  Location: [facility] ORS;  Service: General;  Laterality: Right;  . SHOULDER ARTHROSCOPY Right     FAMILY HISTORY: Family History  Problem Relation Age of Onset  .  Breast cancer Neg Hx     ADVANCED DIRECTIVES (Y/N):  N  HEALTH MAINTENANCE: Social History   Tobacco Use  . Smoking status: Never Smoker  . Smokeless tobacco: Never Used  Substance Use Topics  . Alcohol use: No  . Drug use: No     Colonoscopy:  PAP:  Bone density:  Lipid panel:  No Known  Allergies  Current Outpatient Medications  Medication Sig Dispense Refill  . letrozole (FEMARA) 2.5 MG tablet TAKE 1 TABLET BY MOUTH ONCE DAILY. 90 tablet 0  . levothyroxine (SYNTHROID, LEVOTHROID) 100 MCG tablet Take 100 mcg by mouth daily before breakfast.    . lisinopril (PRINIVIL,ZESTRIL) 10 MG tablet Take 10 mg by mouth daily.    . Multiple Vitamin (MULTIVITAMIN WITH MINERALS) TABS tablet Take 1 tablet by mouth daily.     No current facility-administered medications for this visit.    OBJECTIVE: Vitals:   05/25/19 1406 05/26/19 1018  BP:  135/77  Pulse:  75  Temp: 98.2 F (36.8 C) 98.2 F (36.8 C)     Body mass index is 30.35 kg/m.    ECOG FS:0 - Asymptomatic  General: Well-developed, well-nourished, no acute distress. Eyes: Pink conjunctiva, anicteric sclera. HEENT: Normocephalic, moist mucous membranes. Breast: Right breast with palpable cyst with surgical scar. Lungs: No audible wheezing or coughing. Heart: Regular rate and rhythm. Abdomen: Soft, nontender, no obvious distention. Musculoskeletal: No edema, cyanosis, or clubbing. Neuro: Alert, answering all questions appropriately. Cranial nerves grossly intact. Skin: No rashes or petechiae noted. Psych: Normal affect.   LAB RESULTS:  Lab Results  Component Value Date   NA 137 01/01/2017   K 3.3 (L) 01/01/2017   CL 100 (L) 01/01/2017   CO2 28 01/01/2017   GLUCOSE 125 (H) 01/01/2017   BUN 5 (L) 01/01/2017   CREATININE 0.55 01/01/2017   CALCIUM 8.9 01/01/2017   PROT 6.2 (L) 01/01/2017   ALBUMIN 3.1 (L) 01/01/2017   AST 38 01/01/2017   ALT 13 (L) 01/01/2017   ALKPHOS 104 01/01/2017   BILITOT 0.4 01/01/2017   GFRNONAA >60 01/01/2017   GFRAA >60 01/01/2017    Lab Results  Component Value Date   WBC 23.2 (H) 01/01/2017   NEUTROABS 20.9 (H) 01/01/2017   HGB 10.3 (L) 01/01/2017   HCT 30.7 (L) 01/01/2017   MCV 97.6 01/01/2017   PLT 113 (L) 01/01/2017     STUDIES: US BREAST LTD UNI RIGHT INC  AXILLA  Result Date: 05/06/2019 CLINICAL DATA:  History of RIGHT lumpectomy with radiation therapy in 2018. Beginning in November 2020, patient had an abscess at the lumpectomy site. The patient was treated with IV antibiotics. Patient had incision and drainage of the abscess cavity on 03/07/2019. The patient is feeling much better. She reports some mild heaviness in the RIGHT breast when she removes her bra. She also notes some firmness in the RIGHT breast, stable since her surgery. EXAM: DIGITAL DIAGNOSTIC RIGHT MAMMOGRAM WITH CAD AND TOMO ULTRASOUND RIGHT BREAST COMPARISON:  02/17/2019 and earlier ACR Breast Density Category b: There are scattered areas of fibroglandular density. FINDINGS: Postoperative changes are identified in the UPPER-OUTER QUADRANT of the RIGHT breast. Lucency and coarse calcifications are consistent with fat necrosis in the lumpectomy cavity. No suspicious mass, distortion, or microcalcifications. Mammographic images were processed with CAD. On physical exam, there are postsurgical changes in the UPPER-OUTER QUADRANT of the RIGHT breast. There is nonfocal firmness in the UPPER-OUTER QUADRANT, consistent with prior treatment. There is no induration or erythema. Targeted  ultrasound is performed, showing a mixed echogenicity collection in the 11:30 o'clock location of the RIGHT breast 1 centimeter from the nipple which measures 2.1 x 1.9 x 1.8 centimeters. Findings are consistent with lumpectomy cavity/fat necrosis. IMPRESSION: Expected postoperative changes in the UPPER-OUTER QUADRANT of the RIGHT breast. Although it would be difficult to exclude infection, clinically, the patient appears to be back to post treatment baseline. The patient concurs with this assessment. I gave her the option of needle guided aspiration of the lumpectomy cavity. However, the patient does not feel this is necessary at this time, and I would agree after our discussion. If symptoms worsen, ultrasound-guided  aspiration can be performed of the lumpectomy cavity. RECOMMENDATION: Recommend bilateral diagnostic mammogram in June 2021. I have discussed the findings and recommendations with the patient. If applicable, a reminder letter will be sent to the patient regarding the next appointment. BI-RADS CATEGORY  2: Benign. Electronically Signed   By: Nolon Nations M.D.   On: 05/06/2019 15:41   MM DIAG BREAST TOMO UNI RIGHT  Result Date: 05/06/2019 CLINICAL DATA:  History of RIGHT lumpectomy with radiation therapy in 2018. Beginning in November 2020, patient had an abscess at the lumpectomy site. The patient was treated with IV antibiotics. Patient had incision and drainage of the abscess cavity on 03/07/2019. The patient is feeling much better. She reports some mild heaviness in the RIGHT breast when she removes her bra. She also notes some firmness in the RIGHT breast, stable since her surgery. EXAM: DIGITAL DIAGNOSTIC RIGHT MAMMOGRAM WITH CAD AND TOMO ULTRASOUND RIGHT BREAST COMPARISON:  02/17/2019 and earlier ACR Breast Density Category b: There are scattered areas of fibroglandular density. FINDINGS: Postoperative changes are identified in the UPPER-OUTER QUADRANT of the RIGHT breast. Lucency and coarse calcifications are consistent with fat necrosis in the lumpectomy cavity. No suspicious mass, distortion, or microcalcifications. Mammographic images were processed with CAD. On physical exam, there are postsurgical changes in the UPPER-OUTER QUADRANT of the RIGHT breast. There is nonfocal firmness in the UPPER-OUTER QUADRANT, consistent with prior treatment. There is no induration or erythema. Targeted ultrasound is performed, showing a mixed echogenicity collection in the 11:30 o'clock location of the RIGHT breast 1 centimeter from the nipple which measures 2.1 x 1.9 x 1.8 centimeters. Findings are consistent with lumpectomy cavity/fat necrosis. IMPRESSION: Expected postoperative changes in the UPPER-OUTER  QUADRANT of the RIGHT breast. Although it would be difficult to exclude infection, clinically, the patient appears to be back to post treatment baseline. The patient concurs with this assessment. I gave her the option of needle guided aspiration of the lumpectomy cavity. However, the patient does not feel this is necessary at this time, and I would agree after our discussion. If symptoms worsen, ultrasound-guided aspiration can be performed of the lumpectomy cavity. RECOMMENDATION: Recommend bilateral diagnostic mammogram in June 2021. I have discussed the findings and recommendations with the patient. If applicable, a reminder letter will be sent to the patient regarding the next appointment. BI-RADS CATEGORY  2: Benign. Electronically Signed   By: Nolon Nations M.D.   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 High risk MammaPrint.  PLAN:    1. 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 High risk MammaPrint. Patient had lumpectomy on August 28, 2016 followed by MammoSite radiation therapy. Given her high risk MammaPrint, patient  received adjuvant chemotherapy with Taxotere and Cytoxan.  She completed cycle 4 of 4 on December 24, 2016.  She abruptly transferred care to [facility] and was initiated on aromatase inhibitor.  Anastrozole was discontinued secondary to side effects.  She is now on letrozole and will complete 5 years of treatment in 2023.  Given her high risk MammaPrint may consider extending treatment.  Her most recent mammogram on May 06, 2019 was reported as BI-RADS 2.  Repeat in February 2022.  Return to clinic in 6 months for routine evaluation. 2.  Bone health: Patient has not had a bone mineral density study done since 2019, therefore will repeat in the next 1 to 2 weeks. 3.  Breast  mass: Likely benign.  Cyst versus abscess.  Patient has follow-up with surgery tomorrow. 4.  Joint pain: Continue symptomatic treatment with Tylenol.  Patient has been instructed that she can also use Advil or Aleve sparingly.    Interpreter was present throughout the entire telemedicine visit.  Patient expressed understanding and was in agreement with this plan. She also understands that She can call clinic at any time with any questions, concerns, or complaints.   Cancer Staging Malignant neoplasm of upper-outer quadrant of right breast in female, estrogen receptor positive ([place]) Staging form: Breast, AJCC 8th Edition - Pathologic stage from 10/01/2016: Stage IA (pT1b, pN0, cM0, G2, ER: Positive, PR: Negative, HER2: Negative) - Signed by Lloyd Huger, MD on 10/01/2016   Lloyd Huger, MD   05/26/2019 12:23 PM

## 2019-05-25 ENCOUNTER — Other Ambulatory Visit: Payer: Self-pay

## 2019-05-25 ENCOUNTER — Encounter: Payer: Self-pay | Admitting: Oncology

## 2019-05-25 ENCOUNTER — Telehealth: Payer: Self-pay | Admitting: Emergency Medicine

## 2019-05-25 NOTE — Telephone Encounter (Signed)
Per Freda Munro she wanted me to contact pt in regards to Breast abscess due to language barrier. Pt states she had [place] on R breast on 2/19. Pt denies drainage, fevers, chills. Pt states where she had previous I&D she has a small white lump with redness. Pt states she feels like breast is retaining fluid. Pt states no pain but describes it as a "shooting pain". Pt is seeing Dr Grayland Ormond tomorrow 3/11 for an office visit.  Dr Dahlia Byes is able to see pt on Friday 3/12. appt was made and pt made aware of.  Per patient she is going to talk to Dr Grayland Ormond about this abscess and see his input. She states she will call the office if she needs to change appt date.

## 2019-05-25 NOTE — Progress Notes (Signed)
Patient stated that she had been having difficulty swallowing her food and fluids. Patient stated that she feels that she chokes at times when she eats. Patient also stated that she had been having difficulty sleeping at bedtime and that there are nights that she doesn't sleep. Patient stated that she suffers from constipation and currently taking chamomile teas to help her relax and help with her bowel movements. Patient stated that for the past three to four days she had noticed an abscess growing again from her left breast and would like to know if she needed to go and see her surgeon.

## 2019-05-26 ENCOUNTER — Inpatient Hospital Stay: Payer: Medicaid Other | Attending: Oncology | Admitting: Oncology

## 2019-05-26 ENCOUNTER — Encounter: Payer: Self-pay | Admitting: Oncology

## 2019-05-26 VITALS — BP 135/77 | HR 75 | Temp 98.2°F | Wt 155.4 lb

## 2019-05-26 DIAGNOSIS — E039 Hypothyroidism, unspecified: Secondary | ICD-10-CM | POA: Diagnosis not present

## 2019-05-26 DIAGNOSIS — Z79811 Long term (current) use of aromatase inhibitors: Secondary | ICD-10-CM | POA: Diagnosis not present

## 2019-05-26 DIAGNOSIS — Z923 Personal history of irradiation: Secondary | ICD-10-CM | POA: Insufficient documentation

## 2019-05-26 DIAGNOSIS — Z79899 Other long term (current) drug therapy: Secondary | ICD-10-CM | POA: Diagnosis not present

## 2019-05-26 DIAGNOSIS — Z90710 Acquired absence of both cervix and uterus: Secondary | ICD-10-CM | POA: Diagnosis not present

## 2019-05-26 DIAGNOSIS — I10 Essential (primary) hypertension: Secondary | ICD-10-CM | POA: Insufficient documentation

## 2019-05-26 DIAGNOSIS — C50411 Malignant neoplasm of upper-outer quadrant of right female breast: Secondary | ICD-10-CM | POA: Insufficient documentation

## 2019-05-26 DIAGNOSIS — Z170 Estrogen receptor positive status [ER+]: Secondary | ICD-10-CM

## 2019-05-27 ENCOUNTER — Ambulatory Visit (INDEPENDENT_AMBULATORY_CARE_PROVIDER_SITE_OTHER): Payer: Medicaid Other | Admitting: Surgery

## 2019-05-27 ENCOUNTER — Other Ambulatory Visit: Payer: Self-pay

## 2019-05-27 DIAGNOSIS — R109 Unspecified abdominal pain: Secondary | ICD-10-CM | POA: Diagnosis not present

## 2019-05-27 DIAGNOSIS — N611 Abscess of the breast and nipple: Secondary | ICD-10-CM

## 2019-05-27 MED ORDER — SULFAMETHOXAZOLE-TRIMETHOPRIM 800-160 MG PO TABS: 1.0000 | ORAL_TABLET | Freq: Two times a day (BID) | ORAL | 0 refills | Status: DC

## 2019-05-27 MED ORDER — FLUCONAZOLE 200 MG PO TABS: 200.0000 mg | ORAL_TABLET | Freq: Every day | ORAL | 0 refills | Status: AC

## 2019-05-27 NOTE — Patient Instructions (Addendum)
Dr.Pabon would like Wanda Reed to have a repeat Ultrasound of Right Breast. Will contact Wanda Reed with the appointment.   Wanda Reed will have CT Abdomen Pelvis W. Contrast at [facility] 06/02/19 with arrival time of 9:45am with scan at 10:00am. Wanda Reed is NOT to have anything to eat or drink 4 hours prior to scan. Wanda Reed is to pick up prep kit. Wanda Reed will have Creatinine and BUN lab work prior to CT scan.   Dr.Pabon will send over prescription(s) for antibiotic Bactrim DS and Diflucan to Wanda Reed's local pharmacy.  Autoexamen de [employer] Breast Self-Awareness El autoexamen de mamas es para Civil engineer, contracting la apariencia y la sensibilidad de las [place]. Es importante autoexaminarse las [place]. Permite detectar un problema en las mamas a tiempo mientras todava es pequeo y puede tratarse. Todas las mujeres deben autoexaminarse las [place], incluso aquellas que se sometieron a implantes mamarios. Informe al mdico si advierte un cambio en las mamas. Lo que necesita:  Un espejo.  Una habitacin bien iluminada. Cmo realizar el autoexamen de mamas El autoexamen de [place] es una forma de aprender qu es normal para sus mamas y revisar si hay cambios. Para hacer un autoexamen de las mamas: Busque cambios  1. Qutese toda la ropa por encima de la cintura. 2. Prese frente a un espejo en una habitacin con buena iluminacin. 3. Apoye las manos en las caderas. 4. Empuje hacia abajo con las manos. [place] y los pezones en el espejo para ver si una mama o un pezn se ve diferente del otro. Durante el examen, intente determinar si: ? La forma de una mama es diferente. ? El tamao de [place] mama es [place]. ? Hay arrugas, depresiones y protuberancias en [place] y no en la otra. 6. Observe cada mama para buscar cambios en la piel, por ejemplo: ? Enrojecimiento. ? Zonas escamosas. 7. Observe si hay cambios en los pezones, por ejemplo: ? Lquido alrededor [employer]. ? [place]. ? Hoyuelos. ? Enrojecimiento. ? Un cambio en el lugar de los pezones. [place] si hay cambios  1. Acustese en el piso boca arriba. 2. Plpese cada mama. Para hacerlo, siga estos pasos: ? Elija una mama para palpar. ? Coloque el brazo ms cercano a esa mama por encima de la cabeza. ? Use el otro brazo para palpar la zona del pezn de la mama. Plpese la zona con las yemas de los tres dedos del medio y haga crculos con los dedos. Con el primer crculo, presione suavemente. Con el segundo, ms fuerte. Con el tercero, an ms fuerte. ? Siga haciendo crculos con los dedos con las diferentes presiones a medida que desciende por la mama. Detngase cuando sienta las costillas. ? Desplace los dedos un poco hacia el centro del cuerpo. ? Empiece a hacer crculos con los dedos nuevamente y esta vez haga movimientos ascendentes hasta llegar a la clavcula. ? Siga haciendo crculos [place] y [place] llegar a la axila. Recuerde hacerlos con las tres presiones. ? Plpese la otra mama de la misma forma. 3. Sintese o prese en la ducha o la baera. 4. Con agua jabonosa en la piel, plpese cada mama del mismo modo que lo hizo en el paso2 mientras estaba acostada en el piso. Anote sus hallazgos Anotar lo que encuentra puede ayudarla a recordar qu contarle al mdico. Los [place] los siguientes datos:  Qu es normal para cada mama.  Cualquier cambio que encuentre en cada mama, por ejemplo: ? La clase de cambios  que encuentra. ? Si tiene dolor. ? Si hay bultos, su tamao y [place].  Cundo tuvo su ltimo perodo menstrual. Consejos generales  [facility].  Si est amamantando, el mejor momento para el examen de las mamas es despus de darle de Administrator, arts al beb o despus de usar un sacaleches.  Si tiene perodos menstruales, el mejor momento para hacerlo es de 5 a 7das despus de la finalizado el perodo menstrual.  Con el tiempo, se sentir  cmoda con el autoexamen y Medical laboratory scientific officer a saber si hay cambios en sus mamas. Comunquese con un mdico si:  Observa un cambio en la forma o el tamao de las mamas o los pezones.  Observa un cambio en la piel de las mamas o los pezones, como la piel enrojecida o escamosa.  Tiene secrecin de lquido proveniente de los pezones que no es normal.  [place] un ndulo o una zona engrosada que no tena antes.  Tiene dolor en las [place].  Tiene alguna inquietud [employer] de la [place]. Resumen  El autoexamen de mamas incluye buscar cambios en las [place], y tambin palpar para Hydrographic surveyor cualquier cambio en las [place].  El autoexamen de mamas debe hacerse frente a un espejo en una habitacin bien iluminada.  Debe revisarse las [employer] Foods. Si tiene perodos menstruales, el mejor momento para hacerlo es de 5 a 7das despus de la finalizado el perodo menstrual.  Informe al mdico si ve cambios en las mamas, como cambios en el tamao, cambios en la piel, Social research officer, government o sensibilidad, o un lquido inusual que sale de los pezones. Esta informacin no tiene Marine scientist el consejo del mdico. Asegrese de hacerle al mdico cualquier pregunta que tenga. Document Revised: 12/01/2017 Document Reviewed: 12/01/2017 Elsevier Wanda Reed Education  [place].

## 2019-05-30 ENCOUNTER — Telehealth: Payer: Self-pay

## 2019-05-30 NOTE — Progress Notes (Signed)
Outpatient Surgical Follow Up  05/30/2019  Wanda Reed is an 64 y.o. female.   Chief Complaint  Patient presents with  . Follow-up    breast abscess/pain appt per dr Lakyra Tippins    HPI: Wanda Reed is a 64 year old female with a history of left breast lumpectomy as well as  radiation. She didhavesurgery by Dr. Bary Castilla on June 2018. she developed a small abscess and was treated appropriately with IV antibiotics.    More recently she started having drainage from the right breast and tenderness.  She was recently seen by Dr. Grayland Ormond.  No fevers no chills She also reports intermittent abdominal pains that are diffuse crampy in nature and intermittent.  Mild to moderate in intensity  Past Medical History:  Diagnosis Date  . Anxiety   . Breast cancer ([place]) 07/2016   right breast lumpectomy with chemo and mammosite  . Breast cancer of upper-outer quadrant of right female breast ([place]) 08/2016   1.0 cm ER+, PR-, Her 2 neu not overexpressed. Node negative. T1b, N0.  [name] Cancer ([facility])    cervical  . Hypertension   . Hypothyroidism   . Personal history of chemotherapy 11/2016-12/2016   right breast ca  . Personal history of radiation therapy 09/15/2016   mammosite  . Thyroid disease     Past Surgical History:  Procedure Laterality Date  . ABDOMINAL HYSTERECTOMY    . BREAST BIOPSY Right 08/08/2016   invasive mammary carcinoma  . BREAST LUMPECTOMY Right 08/28/2016   invasive mammary carcinoma, clear margins, negative lymph nodes  . BREAST LUMPECTOMY WITH SENTINEL LYMPH NODE BIOPSY Right 08/28/2016   Procedure: BREAST LUMPECTOMY WITH SENTINEL LYMPH NODE BX;  Surgeon: [name], MD;  Location: [facility] ORS;  Service: General;  Laterality: Right;  . BREAST MAMMOSITE Right 09/15/2016   Procedure: MAMMOSITE BREAST;  Surgeon: [name], MD;  Location: [facility] ORS;  Service: General;  Laterality: Right;  . BREAST SURGERY Right    Breast Biopsy  . CERVIX SURGERY    . COLONOSCOPY   2008  . EYE SURGERY Bilateral    Cataract Extraction with IOL  . PORTA CATH INSERTION Right 11/20/2016   Procedure: PORTA CATH INSERTION;  Surgeon: [name], MD;  Location: [facility] ORS;  Service: General;  Laterality: Right;  . SHOULDER ARTHROSCOPY Right     Family History  Problem Relation Age of Onset  . Breast cancer Neg Hx     Social History:  reports that she has never smoked. She has never used smokeless tobacco. She reports that she does not drink alcohol or use drugs.  Allergies: No Known Allergies  Medications reviewed.    ROS Full ROS performed and is otherwise negative other than what is stated in HPI   There were no vitals taken for this visit.  Physical Exam Vitals and nursing note reviewed. Exam conducted with a chaperone present.  Constitutional:      General: She is not in acute distress.    Appearance: Normal appearance.  Cardiovascular:     Rate and Rhythm: Normal rate.  Pulmonary:     Effort: Pulmonary effort is normal. No respiratory distress.     Breath sounds: Normal breath sounds.     Comments: BREAST: right breast lumpectomy scar, there is induration and scant serous drainage from 1 mm small opening. No obvious collection or fluctuance. Abdominal:     General: Abdomen is flat. There is no distension.     Palpations: There is no mass.  Tenderness: There is abdominal tenderness. There is no guarding.     Hernia: No hernia is present.     Comments: Diffuse mild abdominal tenderness without peritonitis  Skin:    General: Skin is warm and dry.     Capillary Refill: Capillary refill takes less than 2 seconds.  Neurological:     General: No focal deficit present.     Mental Status: She is alert and oriented to person, place, and time.  Psychiatric:        Mood and Affect: Mood normal.        Behavior: Behavior normal.        Thought Content: Thought content normal.        Judgment: Judgment normal.    Assessment/Plan: Right breast  induration and cellulitis consistent with mastitis and likely spontaneously drained abscess.  Will start antibiotic therapy with Bactrim.  We will also make sure that she gets another ultrasound for potentially undrained pockets.  She does have significant GI issues and I will order a CT scan of the abdomen and pelvis to make sure were not missing any intra-abdominal pathology.  We will see her back in a couple weeks.  Greater than 50% of the 25 minutes  visit was spent in counseling/coordination of care   [name], MD [facility] Surgeon

## 2019-05-30 NOTE — Telephone Encounter (Signed)
Spoke with [facility] and patient is scheduled for Ultrasound of Right Breast on Wednesday 06/01/19 at 10:20am. Almyra Free will contact patient to notify her of appointment. Due to patient needing a spanish interpreter.

## 2019-06-01 ENCOUNTER — Other Ambulatory Visit: Payer: Self-pay

## 2019-06-01 ENCOUNTER — Other Ambulatory Visit: Payer: Self-pay | Admitting: Surgery

## 2019-06-01 ENCOUNTER — Ambulatory Visit
Admission: RE | Admit: 2019-06-01 | Discharge: 2019-06-01 | Disposition: A | Discharge status: Home / Self Care | Payer: Medicaid Other | Source: Ambulatory Visit | Attending: Surgery | Admitting: Surgery

## 2019-06-01 ENCOUNTER — Ambulatory Visit
Admission: RE | Admit: 2019-06-01 | Discharge: 2019-06-01 | Disposition: A | Discharge status: Home / Self Care | Payer: Medicaid Other | Source: Ambulatory Visit

## 2019-06-01 DIAGNOSIS — N611 Abscess of the breast and nipple: Secondary | ICD-10-CM

## 2019-06-01 HISTORY — PX: BREAST BIOPSY: SHX20

## 2019-06-02 ENCOUNTER — Other Ambulatory Visit: Payer: Self-pay

## 2019-06-02 ENCOUNTER — Ambulatory Visit
Admission: RE | Admit: 2019-06-02 | Discharge: 2019-06-02 | Disposition: A | Discharge status: Home / Self Care | Payer: Medicaid Other | Source: Ambulatory Visit | Attending: Surgery | Admitting: Surgery

## 2019-06-02 ENCOUNTER — Ambulatory Visit: Admission: RE | Admit: 2019-06-02 | Payer: Medicaid Other | Source: Ambulatory Visit

## 2019-06-02 DIAGNOSIS — R109 Unspecified abdominal pain: Secondary | ICD-10-CM | POA: Insufficient documentation

## 2019-06-02 LAB — SURGICAL PATHOLOGY

## 2019-06-02 LAB — POCT I-STAT CREATININE: Creatinine, Ser: 0.6 mg/dL (ref 0.44–1.00)

## 2019-06-02 MED ORDER — IOHEXOL 300 MG/ML  SOLN
100.0000 mL | Freq: Once | INTRAMUSCULAR | Status: AC | PRN
  Administered 2019-06-02: 100 mL via INTRAVENOUS

## 2019-06-03 ENCOUNTER — Telehealth: Payer: Self-pay | Admitting: Emergency Medicine

## 2019-06-03 ENCOUNTER — Other Ambulatory Visit: Payer: Self-pay | Admitting: Emergency Medicine

## 2019-06-03 DIAGNOSIS — N200 Calculus of kidney: Secondary | ICD-10-CM

## 2019-06-03 NOTE — Telephone Encounter (Signed)
Per Dr Dahlia Byes, pt CT scan showed kidney stones. Dr Dahlia Byes wants pt referred to Urology and wants pt to have Lab Work (CBC and CMP) done before f/u appt on 06/15/19. Orders have been placed.   Called pt and spoke to her and daughter Lenna Sciara. Advised Urology will contact them to set up appointment if they don't hear from them to call our office back. Also advised them that pt needs to have lab work done [facility] before appt.   Pt and daughter voiced understanding.

## 2019-06-06 ENCOUNTER — Ambulatory Visit: Payer: Self-pay | Admitting: Surgery

## 2019-06-06 LAB — AEROBIC/ANAEROBIC CULTURE W GRAM STAIN (SURGICAL/DEEP WOUND): Culture: NO GROWTH

## 2019-06-07 ENCOUNTER — Telehealth: Payer: Self-pay | Admitting: Emergency Medicine

## 2019-06-07 NOTE — Telephone Encounter (Signed)
[facility] Radiology called stating patients breast bx 06/01/19 shows infection.  Pt is currently on Bactrim, has 7 days left not including today.   Spoke to Dr Hampton Abbot in regards to this. States pt should continue Bactrim and keep f/u appt with Dr Dahlia Byes on 06/15/19.

## 2019-06-09 ENCOUNTER — Ambulatory Visit
Admission: RE | Admit: 2019-06-09 | Discharge: 2019-06-09 | Disposition: A | Discharge status: Home / Self Care | Payer: Medicaid Other | Source: Ambulatory Visit | Attending: Oncology | Admitting: Oncology

## 2019-06-09 DIAGNOSIS — Z170 Estrogen receptor positive status [ER+]: Secondary | ICD-10-CM

## 2019-06-09 DIAGNOSIS — C50411 Malignant neoplasm of upper-outer quadrant of right female breast: Secondary | ICD-10-CM | POA: Diagnosis present

## 2019-06-10 ENCOUNTER — Other Ambulatory Visit
Admission: RE | Admit: 2019-06-10 | Discharge: 2019-06-10 | Disposition: A | Discharge status: Home / Self Care | Payer: Medicaid Other | Attending: Surgery | Admitting: Surgery

## 2019-06-10 DIAGNOSIS — N200 Calculus of kidney: Secondary | ICD-10-CM | POA: Diagnosis present

## 2019-06-10 DIAGNOSIS — R109 Unspecified abdominal pain: Secondary | ICD-10-CM

## 2019-06-10 LAB — CBC WITH DIFFERENTIAL/PLATELET
Abs Immature Granulocytes: 0.01 10*3/uL (ref 0.00–0.07)
Basophils Absolute: 0 10*3/uL (ref 0.0–0.1)
Basophils Relative: 1 %
Eosinophils Absolute: 0.1 10*3/uL (ref 0.0–0.5)
Eosinophils Relative: 2 %
HCT: 36.8 % (ref 36.0–46.0)
Hemoglobin: 12.2 g/dL (ref 12.0–15.0)
Immature Granulocytes: 0 %
Lymphocytes Relative: 34 %
Lymphs Abs: 1.2 10*3/uL (ref 0.7–4.0)
MCH: 30.2 pg (ref 26.0–34.0)
MCHC: 33.2 g/dL (ref 30.0–36.0)
MCV: 91.1 fL (ref 80.0–100.0)
Monocytes Absolute: 0.3 10*3/uL (ref 0.1–1.0)
Monocytes Relative: 9 %
Neutro Abs: 1.9 10*3/uL (ref 1.7–7.7)
Neutrophils Relative %: 54 %
Platelets: 143 10*3/uL — ABNORMAL LOW (ref 150–400)
RBC: 4.04 MIL/uL (ref 3.87–5.11)
RDW: 12.2 % (ref 11.5–15.5)
WBC: 3.4 10*3/uL — ABNORMAL LOW (ref 4.0–10.5)
nRBC: 0 % (ref 0.0–0.2)

## 2019-06-10 LAB — COMPREHENSIVE METABOLIC PANEL
ALT: 33 U/L (ref 0–44)
AST: 58 U/L — ABNORMAL HIGH (ref 15–41)
Albumin: 3.8 g/dL (ref 3.5–5.0)
Alkaline Phosphatase: 116 U/L (ref 38–126)
Anion gap: 7 (ref 5–15)
BUN: 13 mg/dL (ref 8–23)
CO2: 27 mmol/L (ref 22–32)
Calcium: 9.2 mg/dL (ref 8.9–10.3)
Chloride: 104 mmol/L (ref 98–111)
Creatinine, Ser: 0.71 mg/dL (ref 0.44–1.00)
GFR calc Af Amer: >60 mL/min (ref >60)
GFR calc non Af Amer: >60 mL/min (ref >60)
Glucose, Bld: 160 mg/dL — ABNORMAL HIGH (ref 70–99)
Potassium: 4.5 mmol/L (ref 3.5–5.1)
Sodium: 138 mmol/L (ref 135–145)
Total Bilirubin: 0.4 mg/dL (ref 0.3–1.2)
Total Protein: 7 g/dL (ref 6.5–8.1)

## 2019-06-13 ENCOUNTER — Ambulatory Visit: Payer: Self-pay | Admitting: Surgery

## 2019-06-15 ENCOUNTER — Other Ambulatory Visit: Payer: Self-pay

## 2019-06-15 ENCOUNTER — Encounter: Payer: Self-pay | Admitting: Surgery

## 2019-06-15 ENCOUNTER — Ambulatory Visit (INDEPENDENT_AMBULATORY_CARE_PROVIDER_SITE_OTHER): Payer: Medicaid Other | Admitting: Surgery

## 2019-06-15 VITALS — BP 113/72 | HR 73 | Temp 97.7°F | Resp 12 | Wt 154.2 lb

## 2019-06-15 DIAGNOSIS — N641 Fat necrosis of breast: Secondary | ICD-10-CM | POA: Diagnosis not present

## 2019-06-15 NOTE — Patient Instructions (Addendum)
Nuestro programador de Event organiser se Cytogeneticist con usted para Risk manager su Leisure centre manager. Tenga la hoja azul disponible cuando ella le llame.  Si tiene preguntas porfavor llame a la oficina.   Tumorectoma Lumpectomy  [employer], a veces llamada mastectoma parcial, es [place] para extirpar un tumor canceroso o bulto (el tumor) de Scientist, research (medical). Es una forma de ciruga para "conservar la mama" o "preservar la mama". Esto significa que se extirpa el tejido canceroso, pero la mama permanece intacta. Durante una tumorectoma, se extirpa la porcin de la mama que contiene el tumor. Tambin puede extirparse el tejido normal que rodea el bulto para asegurarse de que todo el tumor ha sido extirpado. Los ganglios linfticos que estn debajo del brazo tambin pueden extirparse y analizarse para Neurosurgeon si el cncer se ha propagado. Los ganglios linfticos forman parte del sistema del cuerpo que combate las enfermedades (sistema inmunitario) y, por lo general, es Equities trader al que se propaga el cncer de mama. Informe al mdico acerca de lo siguiente:  Cualquier alergia que tenga.  Todos los [employer] [place], incluidos vitaminas, hierbas, gotas oftlmicas, cremas y medicamentos de venta libre.  Cualquier problema previo que usted o algn miembro de su familia hayan tenido con los anestsicos.  Cualquier trastorno de la sangre que tenga.  Cirugas a las que se haya sometido.  Cualquier afeccin mdica que tenga.  Si est embarazada o podra estarlo. Cules son los riesgos? En general, se trata de un procedimiento seguro. Sin embargo, pueden ocurrir complicaciones, por ejemplo:  Sangrado.  Infeccin.  Reaccin alrgica a un medicamento.  Dolor, hinchazn, debilidad o adormecimiento en el brazo del lado de la [place].  Hinchazn temporaria.  Cambio en la forma de la mama, especialmente si se extirpa una porcin grande.  Tejido cicatricial que se forma en el lugar de la ciruga y  que es duro al tacto.  Cogulos de [place]. Qu ocurre antes del procedimiento? Mantenerse hidratada Siga las instrucciones del mdico acerca de mantenerse hidratada, las cuales pueden incluir lo siguiente:  [employer] horas antes del procedimiento, puede beber lquidos transparentes, como agua, jugos de fruta sin pulpa, caf negro y t solo.  Restricciones en las comidas y bebidas Siga las instrucciones del mdico respecto de las restricciones de comidas o bebidas, las cuales pueden incluir lo siguiente:  Ocho horas antes del procedimiento, deje de ingerir comidas o alimentos pesados, como carne, alimentos fritos o alimentos grasos.  Seis horas antes del procedimiento, deje de ingerir comidas o alimentos livianos, como tostadas o cereales.  Seis horas antes del procedimiento, deje de beber [place] o bebidas que [employer].  Dos horas antes del procedimiento, deje de beber lquidos transparentes. Medicamentos Consulte al mdico sobre:  Quarry manager o suspender los medicamentos que [place] habitualmente. Esto es muy importante si toma medicamentos para la diabetes o anticoagulantes.  Tomar medicamentos como aspirina e ibuprofeno. Estos medicamentos pueden tener un efecto anticoagulante en la [place]. No tome estos medicamentos a menos que el mdico se lo indique.  Usar medicamentos de venta libre, vitaminas, hierbas y suplementos. Instrucciones generales  Antes de la [place], el mdico puede realizar un procedimiento para [place] y Scientist, clinical (histocompatibility and immunogenetics) la zona del tumor en la mama (localizacin). Esto ayuda a Psychologist, sport and exercise al [employer] lugar donde se har la incisin. Esto se Fish farm manager de las siguientes maneras: ? Pruebas de diagnstico por imgenes, como una [place], ecografa o una resonancia magntica (RM). ? La colocacin de un implante pequeo en forma de alambre,  clip o semilla que refleje la seal de un radar.  Es posible que le realicen estudios o exmenes de deteccin para obtener  mediciones iniciales del brazo. Esas mediciones pueden compararse con las realizadas despus de la ciruga para controlar si hay hinchazn (linfedema), que puede aparecer despus de que se extirpan los ganglios linfticos.  Pregntele al mdico: ? Cmo se Tax inspector de la Leisure centre manager. ? Qu medidas se tomarn para evitar una infeccin. Estas medidas pueden incluir:  Lavar la piel con un jabn antisptico.  Recibir antibiticos.  Haga que alguien la lleve a su casa desde el hospital o la clnica.  Planifique para que un adulto responsable la cuide durante al menos 24 horas despus de que le den el alta del hospital o de la [place]. Esto es importante. Qu ocurre durante el procedimiento?   Le colocarn una va intravenosa en una vena.  Le administrarn uno o ms de los siguientes medicamentos: ? Un medicamento para ayudar a relajarse (sedante). ? Un medicamento para adormecer la zona (anestesia local). ? Un medicamento que la har dormir (anestesia general).  El mdico usar un bistur elctrico que emplea calor para reducir Garment/textile technologist (Merchant navy officer). Se realizar una incisin curva que sigue la forma normal de la [place]. Este tipo de incisin permitir que las cicatrices sean mnimas y que la cicatrizacin sea mejor.  El tumor se extirpar junto con parte del tejido circundante. Luego se enviar a un laboratorio para su anlisis. [place]ems, el mdico puede extirpar los ganglios linfticos en este momento, si es necesario.  Si el tumor est cerca de los msculos del pecho, tambin puede extirparse Ardelia Mems parte del tejido muscular.  Pueden colocarle un drenaje en la zona de la mama o axila para recolectar el lquido que se acumule despus de la [place]. El drenaje se conectar con una pera de goma de succin fuera del cuerpo para extraer el lquido.  La incisin se cerrar con puntos (suturas).  Se puede colocar una venda (vendaje) sobre la incisin. El procedimiento puede  variar segn el mdico y el hospital. Sander Nephew ocurre despus del procedimiento?  Le controlarn la presin arterial, la frecuencia cardaca, la frecuencia respiratoria y Retail buyer de oxgeno en la sangre hasta que le den el alta del hospital o la clnica.  Le darn medicamentos para Best boy si los necesita.  Le quitarn la va intravenosa cuando pueda comer y beber.  Le recomendarn que se levante y camine lo antes posible. Esto es importante para mejorar el flujo sanguneo y la respiracin. Pida ayuda si se siente dbil o inestable.  Es posible que le realicen: ? Una sonda de drenaje durante 2 o 3 das para evitar la acumulacin de sangre (hematoma) en la mama. Le darn instrucciones para el cuidado del drenaje antes de regresar a su casa. ? La aplicacin de un vendaje compresivo durante 1 o 2 das para evitar el [place]. El vendaje compresivo tendr la apariencia de una pieza gruesa de tela o una venda elstica. Consulte a su mdico cmo debe cuidar el vendaje en su casa.  Es posible que le coloquen una manga ajustada para usar en el brazo del lado de la [place]. Debe usar Scientist, research (life sciences) como se lo haya indicado el mdico.  No conduzca durante 24 horas si le administraron un sedante durante el procedimiento. Resumen  [employer], a veces llamada mastectoma parcial, es [place] para extirpar un tumor canceroso o bulto (el tumor) de la  mama.  Durante una tumorectoma, se extirpa la porcin de la mama que contiene el tumor. Los ganglios linfticos que estn debajo del brazo tambin pueden extirparse y analizarse para Neurosurgeon si el cncer se ha propagado.  Haga que alguien la lleve a su casa desde el hospital o la clnica.  Pueden colocarle un drenaje en la mama durante 2 a 3 das para evitar la acumulacin de sangre (hematoma) en la mama. Le darn instrucciones para el cuidado del drenaje antes de regresar a su casa. Esta informacin no tiene Marine scientist  el consejo del mdico. Asegrese de hacerle al mdico cualquier pregunta que tenga. Document Revised: 10/20/2018 Document Reviewed: 10/20/2018 Elsevier Patient Education  [place].

## 2019-06-16 ENCOUNTER — Encounter: Payer: Self-pay | Admitting: Urology

## 2019-06-16 ENCOUNTER — Ambulatory Visit (INDEPENDENT_AMBULATORY_CARE_PROVIDER_SITE_OTHER): Payer: Medicaid Other | Admitting: Urology

## 2019-06-16 VITALS — BP 119/71 | HR 77 | Ht 60.0 in | Wt 154.0 lb

## 2019-06-16 DIAGNOSIS — R300 Dysuria: Secondary | ICD-10-CM

## 2019-06-16 DIAGNOSIS — R102 Pelvic and perineal pain: Secondary | ICD-10-CM | POA: Diagnosis not present

## 2019-06-16 DIAGNOSIS — N1330 Unspecified hydronephrosis: Secondary | ICD-10-CM | POA: Diagnosis not present

## 2019-06-16 LAB — URINALYSIS, COMPLETE
Bilirubin, UA: NEGATIVE
Glucose, UA: NEGATIVE
Ketones, UA: NEGATIVE
Leukocytes,UA: NEGATIVE
Nitrite, UA: NEGATIVE
Protein,UA: NEGATIVE
RBC, UA: NEGATIVE
Specific Gravity, UA: 1.02 (ref 1.005–1.030)
Urobilinogen, Ur: 0.2 mg/dL (ref 0.2–1.0)
pH, UA: 7 (ref 5.0–7.5)

## 2019-06-16 LAB — MICROSCOPIC EXAMINATION
Bacteria, UA: NONE SEEN
RBC, Urine: NONE SEEN /hpf (ref 0–2)

## 2019-06-16 MED ORDER — SENNOSIDES-DOCUSATE SODIUM 8.6-50 MG PO TABS: 1.0000 | ORAL_TABLET | Freq: Two times a day (BID) | ORAL | 0 refills | Status: DC

## 2019-06-16 NOTE — Patient Instructions (Signed)
Estreimiento en los adultos Constipation, Adult Se llama estreimiento cuando:  Tiene deposiciones (defeca) una menor cantidad de veces a la semana de lo normal.  Tiene dificultad para defecar.  Las heces son secas y duras o son ms grandes que lo normal. Siga estas indicaciones en su casa: Comida y bebida   Consuma alimentos con alto contenido de [place], por ejemplo: ? Lambert Mody y verduras frescas. ? Cereales integrales. ? Frijoles.  Consuma una menor cantidad de alimentos ricos en grasas, con bajo contenido de [place] o excesivamente procesados, como: ? Papas fritas. ? Hamburguesas. ? Galletas. ? Caramelos. ? Gaseosas.  Beba suficiente lquido para mantener el pis (orina) claro o de color amarillo plido. Instrucciones generales  Haga actividad fsica con regularidad o segn las indicaciones del mdico.  Vaya al bao cuando sienta la necesidad de defecar. No se aguante las ganas.  Tome los medicamentos de venta libre y los recetados solamente como se lo haya indicado el mdico. Estos incluyen los suplementos de [place].  Realice ejercicios de reentrenamiento del suelo plvico, como: ? Respirar profundamente mientras relaja la parte inferior del vientre (abdomen). ? Relajar el suelo plvico mientras defeca.  Controle su afeccin para ver si hay cambios.  Concurra a todas las visitas de control como se lo haya indicado el mdico. Esto es importante. Comunquese con un mdico si:  Siente un dolor que empeora.  Tiene fiebre.  No ha defecado por 4das.  Vomita.  No tiene hambre.  Pierde peso.  Tiene una hemorragia en el ano.  Las deposiciones Media planner) son delgadas como un lpiz. Solicite ayuda de inmediato si:  Jaclynn Guarneri, y los sntomas empeoran de repente.  Tiene prdida de materia fecal u observa [employer]AC[employer]InterActiveCorp.  Siente el vientre ms duro o ms grande de lo normal (est hinchado).  Siente un dolor muy intenso en el vientre.  Se siente mareado o se  desmaya. Esta informacin no tiene Marine scientist el consejo del mdico. Asegrese de hacerle al mdico cualquier pregunta que tenga. Document Revised: 06/04/2016 Document Reviewed: 08/22/2015 Elsevier Patient Education  [place].

## 2019-06-16 NOTE — Progress Notes (Signed)
   06/16/2019 10:47 AM   Wanda Reed Sep 14, 1955 [id number]  Reason for visit: Possible right ureteral stone  HPI: I saw Wanda Reed in urology clinic for evaluation of a possible mid right ureteral stone.  Today's visit was conducted via Patent attorney.  I previously saw her in December 2020 for chronic dysuria, dyspareunia, and pelvic pain with benign urinalysis.  I recommended a pelvic MRI to evaluate for a urethral diverticulum, however this was denied by insurance and she did not follow-up.  She recently had a CT for abdominal pain with Dr. Dahlia Byes and radiology read suggested a 3 mm right mid ureteral stone.  On my review, this is more consistent with a phlebolith and has been present since at least 2018.  On my personal review of the CT from 2018 the delayed films show that this phlebolith is clearly outside the ureteral lumen.  I reviewed the imaging with the radiologist on-call and they were in agreement.  Additionally, she has a history of a hysterectomy over 20 years ago for cervical cancer and there are multiple clips low in the pelvis near the right UVJ.  She has a history of some mild hydroureteronephrosis that has been chronic over the last few years, and I suspect is likely related to some inflammation or scarring at the site of these clips near the right ureterovesical junction.   Regarding her abdominal pain, she reports trouble with constipation and bloating.  She describes her right-sided pain that sometimes radiates from the back to the groin.  She denies any aggravating or alleviating factors, specifically that it worsens with fluid or alcohol intake.  She reports the pain has been present for 3 to 4 years.  Urinalysis is again benign today with 0-5 WBCs, 0 RBCs, no bacteria, nitrite negative, no leukocytes.  I had a long conversation with the patient today regarding her constellation of symptoms including right-sided flank pain, bloating, abdominal pain, pelvic pain,  dysuria, and dyspareunia.  Possible etiologies could include urethral diverticulum, chronic constipation, or scarring/inflammation from her prior hysterectomy causing some mild right-sided hydroureteronephrosis.  I recommended starting with a more conservative approach of managing her constipation to see if her abdominal pain improves, however if she does not get improvement over the next few weeks, could consider cystoscopy and a right retrograde pyelogram.  This would unfortunately likely require a robotic ureteral re-implant.  Her renal function is normal.  -Transvaginal ultrasound ordered to evaluate for urethral diverticulum -RTC 4 weeks for symptom check -Consider cystoscopy and right retrograde pyelogram in the future to evaluate for possible scarring at the UVJ from prior hysterectomy  I spent 40 total minutes on the day of the encounter including pre-visit review of the medical record, face-to-face time with the patient, and post visit ordering of labs/imaging/tests.  Billey Co, [facility] [address], [place], [place] [address] [phone]

## 2019-06-20 ENCOUNTER — Ambulatory Visit: Payer: Medicaid Other | Attending: Internal Medicine

## 2019-06-20 ENCOUNTER — Encounter: Payer: Self-pay | Admitting: Surgery

## 2019-06-20 DIAGNOSIS — Z23 Encounter for immunization: Secondary | ICD-10-CM

## 2019-06-20 IMAGING — CT CT ABD-PELV W/ CM
2 of 5 series · 15 of 46 positions shown, 17 images · IV contrast (APPLIED)
Comparison: The chest CT 08/28/2016

CLINICAL DATA: New diagnosis of right breast cancer.

EXAM:
CT ABDOMEN AND PELVIS WITH CONTRAST
TECHNIQUE: Multidetector CT imaging of the abdomen and pelvis was performed
using the standard protocol following bolus administration of
intravenous contrast.
CONTRAST:  100mL W1J7O8-8ZZ IOPAMIDOL (W1J7O8-8ZZ) INJECTION 61%

[Series 2: routine abd/pel with · axial · 0.82mm/px · z∈[-459,-64]mm · 12 of 89 slices shown, 14 images]
[im 5/89  soft-tissue]
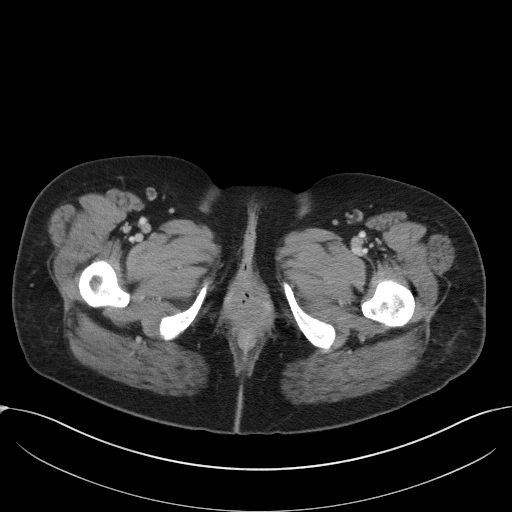
[im 5/89  bone]
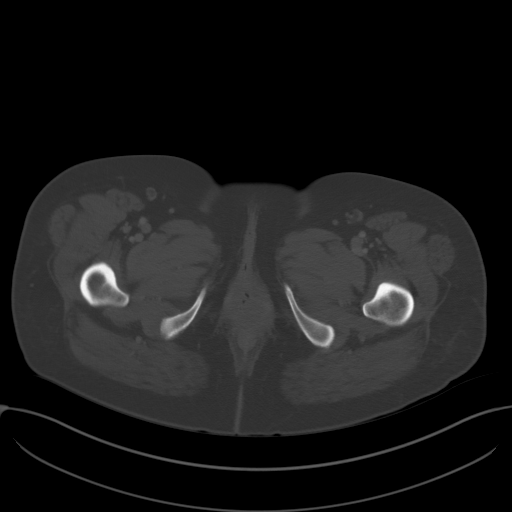
[im 14/89  soft-tissue]
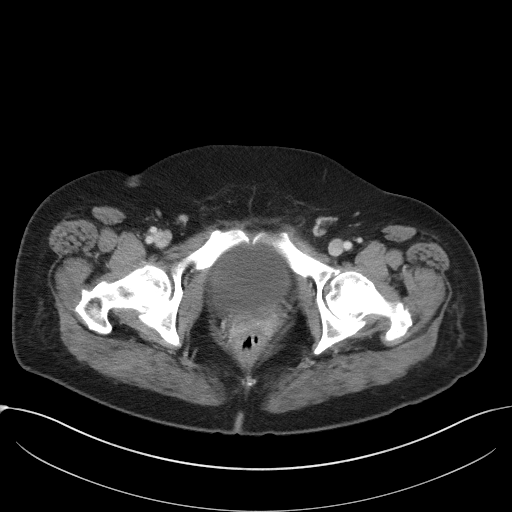
[im 19/89  soft-tissue]
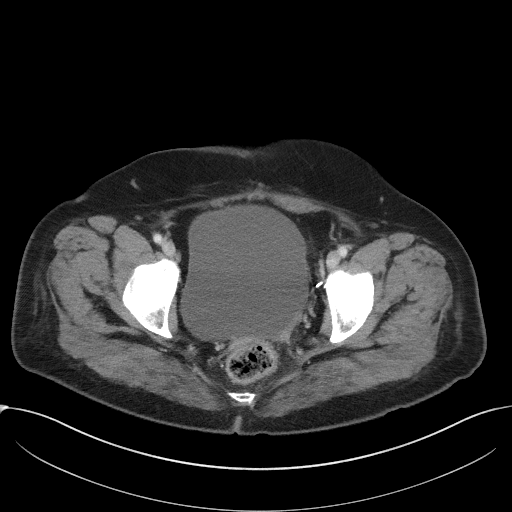
[im 28/89  soft-tissue]
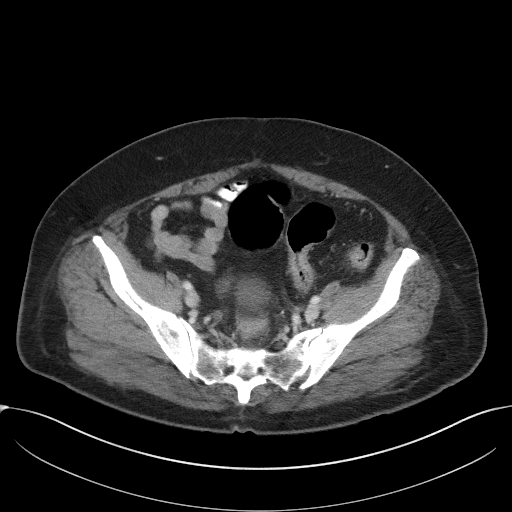
[im 33/89  soft-tissue]
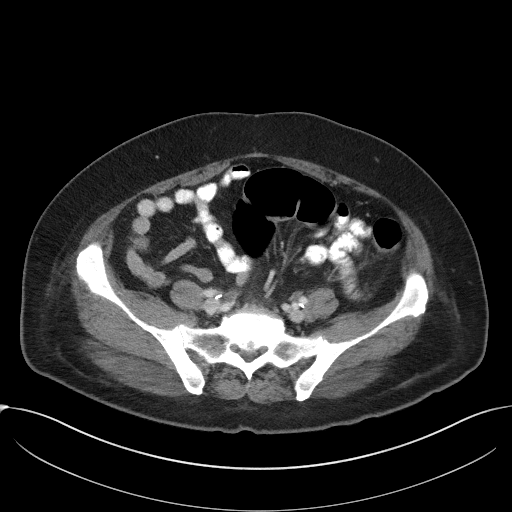
[im 42/89  soft-tissue]
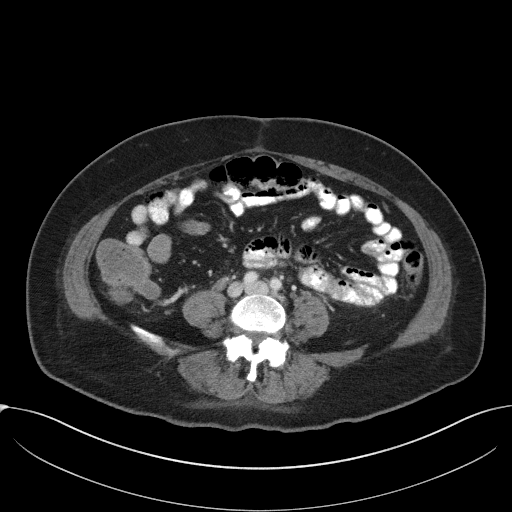
[im 47/89  soft-tissue]
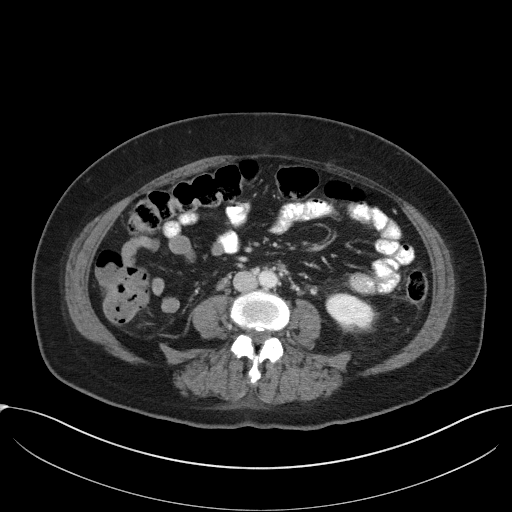
[im 56/89  soft-tissue]
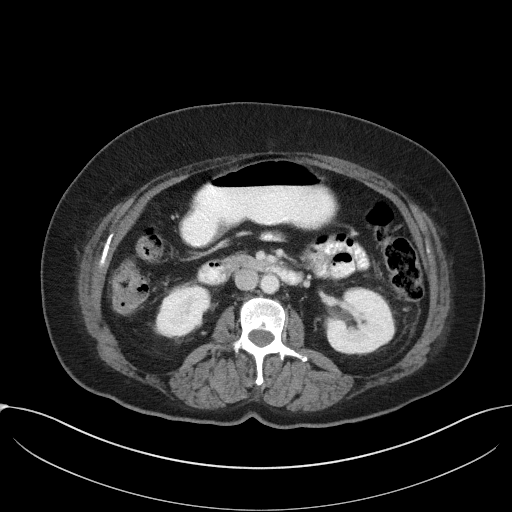
[im 61/89  soft-tissue]
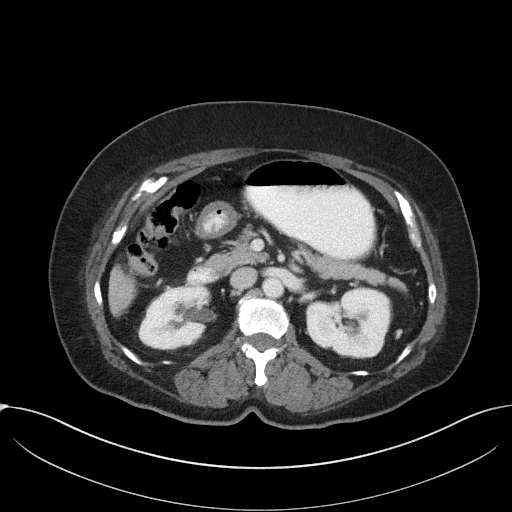
[im 61/89  bone]
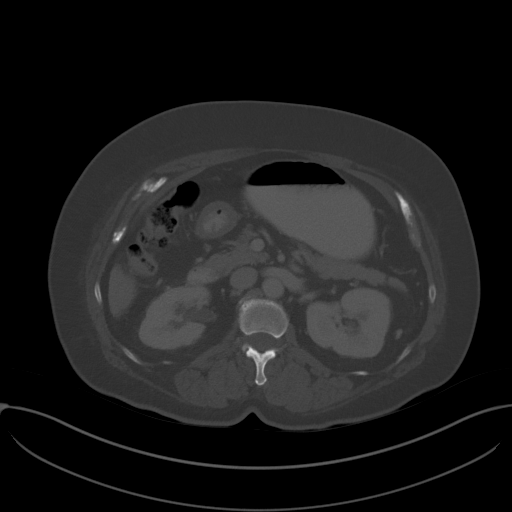
[im 70/89  soft-tissue]
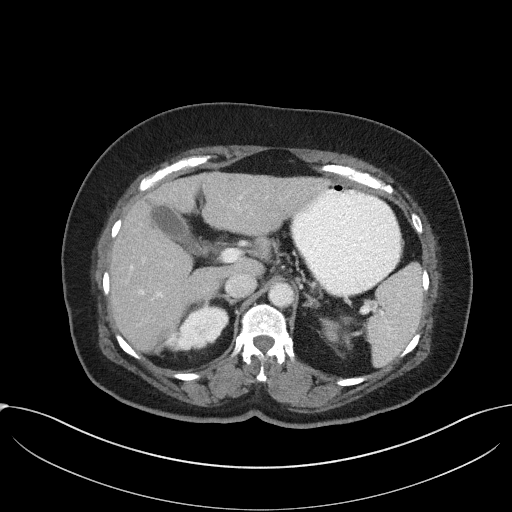
[im 75/89  soft-tissue]
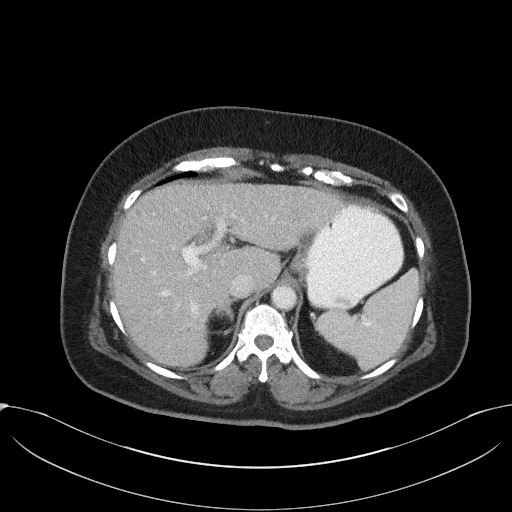
[im 84/89  soft-tissue]
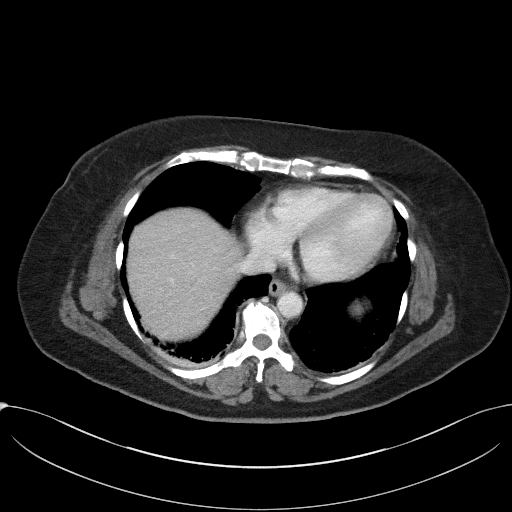

[Series 5: coronal st · coronal · 0.71mm/px · 3 of 93 slices shown]
[im 31/93  soft-tissue]
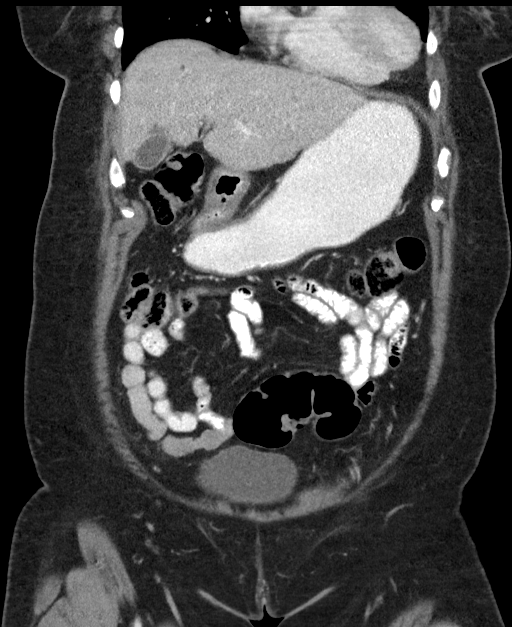
[im 41/93  soft-tissue]
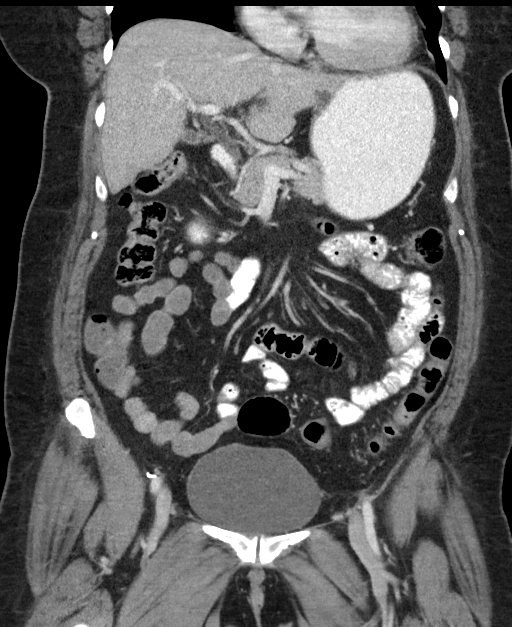
[im 52/93  soft-tissue]
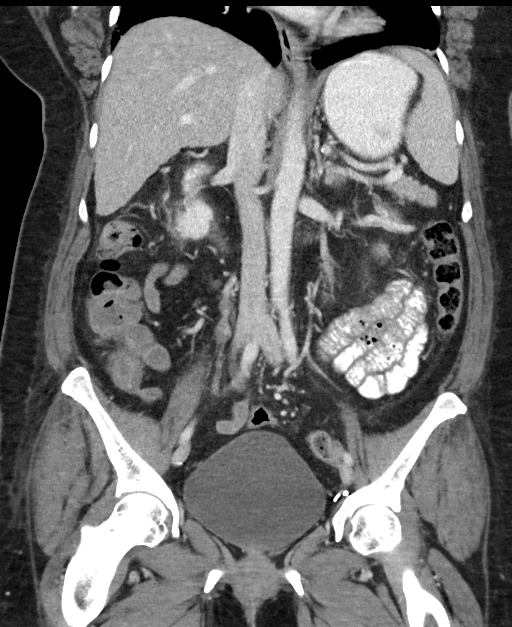

[15 of 46 positions shown; findings below may reference images not displayed]

FINDINGS: Lower chest: Bibasilar dependent subpleural atelectasis. No pleural
effusion or pulmonary lesions. The heart is normal in size. No
pericardial effusion. The distal esophagus and aorta are
unremarkable.

Hepatobiliary: Small low-attenuation liver lesions are stable and
consistent with benign cysts. No worrisome hepatic lesions to
suggest metastatic disease. The gallbladder is normal. No intra or
extrahepatic biliary dilatation. The the liver contour is slightly
irregular, the hepatic fissures are prominent and there is increased
caudate to right lobe ratio. These findings suggest cirrhosis. The
portal and hepatic veins are patent. For early portal venous
collaterals are suspected. No esophageal varices. No splenomegaly.

Pancreas: No mass, inflammation or ductal dilatation.

Spleen: Normal size.  No focal lesions.

Adrenals/Urinary Tract: The adrenal glands and kidneys are
unremarkable. Small scattered cysts are noted. No obstructing
ureteral calculi or bladder calculi. There are 2 small surgical
clips noted near the right distal ureter.

Stomach/Bowel: The stomach, duodenum, small bowel and colon are
grossly normal without oral contrast. No inflammatory changes, mass
lesions or obstructive findings. The terminal ileum and appendix are
normal.

Vascular/Lymphatic: The aorta is normal in caliber. No dissection.
The branch vessels are patent. The major venous structures are
patent. No mesenteric or retroperitoneal mass or adenopathy. Small
scattered lymph nodes are noted.

Reproductive: Surgically absent.

Other: Scattered surgical clips in the pelvis but no mass or
adenopathy. No inguinal mass or adenopathy.

Musculoskeletal: No significant bony findings. No definite findings
for osseous metastatic disease.
IMPRESSION: 1. No findings to suggest metastatic disease involving the
abdomen/pelvis.
2. Cirrhotic changes involving the liver with portal venous
hypertension and early portal venous collaterals. No splenomegaly or
ascites. No esophageal varices.
3. No abdominal/pelvic lymphadenopathy.
4. No findings for osseous metastatic disease.

## 2019-06-20 NOTE — H&P (View-Only) (Signed)
Outpatient Surgical Follow Up  06/20/2019  Wanda Reed is an 64 y.o. female.   Chief Complaint  Patient presents with  . Follow-up    breast abscess - antibiotic, discuss CT scan 3/18 and Breast US 3/17, labwork    HPI: Wanda Reed 64 year old Hispanic female well-known to me with a prior history of lumpectomy in May 2018 and MammoSite radiation.  She developed chronic pain in the right breast and more recently over a year or so has been having chronic right breast pain with induration and some mastitis.  Most recently she underwent an ultrasound with biopsy showing evidence of fat necrosis.  She denies any fevers any chills.  She continues to have the pain on the right breast and she is very frustrated.  She has BEEN GIVEN prolonged antibiotic without any significant improvement  Past Medical History:  Diagnosis Date  . Anxiety   . Breast cancer ([place]) 07/2016   right breast lumpectomy with chemo and mammosite  . Breast cancer of upper-outer quadrant of right female breast ([place]) 08/2016   1.0 cm ER+, PR-, Her 2 neu not overexpressed. Node negative. T1b, N0.  [name] Cervical cancer ([place]) 1990  . Hypertension   . Hypothyroidism   . Personal history of chemotherapy 11/2016-12/2016   right breast ca  . Personal history of radiation therapy 09/15/2016   mammosite  . Thyroid disease     Past Surgical History:  Procedure Laterality Date  . ABDOMINAL HYSTERECTOMY    . BREAST BIOPSY Right 08/08/2016   invasive mammary carcinoma  . BREAST BIOPSY Right 06/01/2019   bx path pending and abbcess drainage  . BREAST LUMPECTOMY Right 08/28/2016   invasive mammary carcinoma, clear margins, negative lymph nodes  . BREAST LUMPECTOMY WITH SENTINEL LYMPH NODE BIOPSY Right 08/28/2016   Procedure: BREAST LUMPECTOMY WITH SENTINEL LYMPH NODE BX;  Surgeon: [name], MD;  Location: [facility] ORS;  Service: General;  Laterality: Right;  . BREAST MAMMOSITE Right 09/15/2016   Procedure: MAMMOSITE BREAST;   Surgeon: [name], MD;  Location: [facility] ORS;  Service: General;  Laterality: Right;  . BREAST SURGERY Right    Breast Biopsy  . CERVIX SURGERY    . COLONOSCOPY  2008  . EYE SURGERY Bilateral    Cataract Extraction with IOL  . PORTA CATH INSERTION Right 11/20/2016   Procedure: PORTA CATH INSERTION;  Surgeon: [name], MD;  Location: [facility] ORS;  Service: General;  Laterality: Right;  . SHOULDER ARTHROSCOPY Right     Family History  Problem Relation Age of Onset  . Breast cancer Neg Hx     Social History:  reports that she has never smoked. She has never used smokeless tobacco. She reports that she does not drink alcohol or use drugs.  Allergies: No Known Allergies  Medications reviewed.    ROS Full ROS performed and is otherwise negative other than what is stated in HPI   BP 113/72   Pulse 73   Temp 97.7 F (36.5 C)   Resp 12   Wt 154 lb 3.2 oz (69.9 kg)   SpO2 98%   BMI 30.12 kg/m   Physical Exam Vitals and nursing note reviewed. Exam conducted with a chaperone present.  Constitutional:      General: She is not in acute distress.    Appearance: Normal appearance. She is normal weight. She is not toxic-appearing.  Eyes:     General: No scleral icterus.       Right eye: No discharge.  Left eye: No discharge.  Cardiovascular:     Rate and Rhythm: Normal rate and regular rhythm.     Heart sounds: No murmur.  Pulmonary:     Effort: Pulmonary effort is normal. No respiratory distress.     Comments: BREAST: Evidence of chronic inflammatory changes on the right breast with significant tenderness and induration.  There is no evidence of drainable fluid collections there is no evidence of necrotizing infection or abscess. Abdominal:     General: Abdomen is flat. There is no distension.     Palpations: There is no mass.     Tenderness: There is no abdominal tenderness. There is no guarding or rebound.     Hernia: No hernia is present.   Musculoskeletal:        General: No swelling. Normal range of motion.     Cervical back: Normal range of motion and neck supple.  Skin:    General: Skin is warm and dry.     Capillary Refill: Capillary refill takes less than 2 seconds.  Neurological:     General: No focal deficit present.     Mental Status: She is alert and oriented to person, place, and time.  Psychiatric:        Mood and Affect: Mood normal.        Behavior: Behavior normal.        Thought Content: Thought content normal.        Judgment: Judgment normal.        Assessment/Plan:  64 year old female with a prior history of breast lumpectomy and radiation therapy for cancer with recurrent inflammatory masses consistent with fat necrosis and some granulomatous component.  She has tried conservative management for over a year and is very frustrated.  I had an extensive discussion with the patient regarding options.  We typically do not perform lumpectomies in the circumstances but the patient is adamant that she is concerned that there may be some hidden nidus of cancer and she cannot live with this chronic induration and pain.  I have also explained to her that even if we do a lumpectomy the pain may persist as well as infection.  Patient is adamant that she wishes to have more definitive form of therapy given that everything else has failed.  Surgical option discussed with the patient in detail. Specifically doing a lumpectomy of the inflammatory area.  She is in agreement.  Discussed with patient detail.  Risk, benefits and possible complications including but not limited to: Bleeding, infection, recurrence of symptoms, upgrading the pathology results and chronic pain.  She understands and wished to proceed   Greater than 50% of the 40 minutes  visit was spent in counseling/coordination of care   [name], MD [place] Surgeon

## 2019-06-20 NOTE — Progress Notes (Signed)
   Covid-19 Vaccination Clinic  Name:  Wanda Reed    MRN: [id number] DOB: Oct 30, 1955  06/20/2019  [name] was observed post Covid-19 immunization for 15 minutes without incident. She was provided with Vaccine Information Sheet and instruction to access the V-Safe system. Medical Interpreter used.  [name] was instructed to call 911 with any severe reactions post vaccine: [name] Difficulty breathing  . Swelling of face and throat  . A fast heartbeat  . A bad rash all over body  . Dizziness and weakness   Immunizations Administered    Name Date Dose VIS Date Route   Pfizer COVID-19 Vaccine 06/20/2019  4:20 PM 0.3 mL 02/25/2019 Intramuscular   Manufacturer: [place]   Lot: [id number]   [place]: [id number]

## 2019-06-20 NOTE — Progress Notes (Signed)
Outpatient Surgical Follow Up  06/20/2019  Wanda Reed is an 64 y.o. female.   Chief Complaint  Patient presents with  . Follow-up    breast abscess - antibiotic, discuss CT scan 3/18 and Breast US 3/17, labwork    HPI: Wanda Reed 64 year old Hispanic female well-known to me with a prior history of lumpectomy in May 2018 and MammoSite radiation.  She developed chronic pain in the right breast and more recently over a year or so has been having chronic right breast pain with induration and some mastitis.  Most recently she underwent an ultrasound with biopsy showing evidence of fat necrosis.  She denies any fevers any chills.  She continues to have the pain on the right breast and she is very frustrated.  She has BEEN GIVEN prolonged antibiotic without any significant improvement  Past Medical History:  Diagnosis Date  . Anxiety   . Breast cancer ([place]) 07/2016   right breast lumpectomy with chemo and mammosite  . Breast cancer of upper-outer quadrant of right female breast ([name]) 08/2016   1.0 cm ER+, PR-, Her 2 neu not overexpressed. Node negative. T1b, N0.  [name] Cervical cancer ([name]) 1990  . Hypertension   . Hypothyroidism   . Personal history of chemotherapy 11/2016-12/2016   right breast ca  . Personal history of radiation therapy 09/15/2016   mammosite  . Thyroid disease     Past Surgical History:  Procedure Laterality Date  . ABDOMINAL HYSTERECTOMY    . BREAST BIOPSY Right 08/08/2016   invasive mammary carcinoma  . BREAST BIOPSY Right 06/01/2019   bx path pending and abbcess drainage  . BREAST LUMPECTOMY Right 08/28/2016   invasive mammary carcinoma, clear margins, negative lymph nodes  . BREAST LUMPECTOMY WITH SENTINEL LYMPH NODE BIOPSY Right 08/28/2016   Procedure: BREAST LUMPECTOMY WITH SENTINEL LYMPH NODE BX;  Surgeon: [name], MD;  Location: [facility] ORS;  Service: General;  Laterality: Right;  . BREAST MAMMOSITE Right 09/15/2016   Procedure: MAMMOSITE BREAST;   Surgeon: [name], MD;  Location: [facility] ORS;  Service: General;  Laterality: Right;  . BREAST SURGERY Right    Breast Biopsy  . CERVIX SURGERY    . COLONOSCOPY  2008  . EYE SURGERY Bilateral    Cataract Extraction with IOL  . PORTA CATH INSERTION Right 11/20/2016   Procedure: PORTA CATH INSERTION;  Surgeon: [name], MD;  Location: [facility] ORS;  Service: General;  Laterality: Right;  . SHOULDER ARTHROSCOPY Right     Family History  Problem Relation Age of Onset  . Breast cancer Neg Hx     Social History:  reports that she has never smoked. She has never used smokeless tobacco. She reports that she does not drink alcohol or use drugs.  Allergies: No Known Allergies  Medications reviewed.    ROS Full ROS performed and is otherwise negative other than what is stated in HPI   BP 113/72   Pulse 73   Temp 97.7 F (36.5 C)   Resp 12   Wt 154 lb 3.2 oz (69.9 kg)   SpO2 98%   BMI 30.12 kg/m   Physical Exam Vitals and nursing note reviewed. Exam conducted with a chaperone present.  Constitutional:      General: She is not in acute distress.    Appearance: Normal appearance. She is normal weight. She is not toxic-appearing.  Eyes:     General: No scleral icterus.       Right eye: No discharge.  Left eye: No discharge.  Cardiovascular:     Rate and Rhythm: Normal rate and regular rhythm.     Heart sounds: No murmur.  Pulmonary:     Effort: Pulmonary effort is normal. No respiratory distress.     Comments: BREAST: Evidence of chronic inflammatory changes on the right breast with significant tenderness and induration.  There is no evidence of drainable fluid collections there is no evidence of necrotizing infection or abscess. Abdominal:     General: Abdomen is flat. There is no distension.     Palpations: There is no mass.     Tenderness: There is no abdominal tenderness. There is no guarding or rebound.     Hernia: No hernia is present.   Musculoskeletal:        General: No swelling. Normal range of motion.     Cervical back: Normal range of motion and neck supple.  Skin:    General: Skin is warm and dry.     Capillary Refill: Capillary refill takes less than 2 seconds.  Neurological:     General: No focal deficit present.     Mental Status: She is alert and oriented to person, place, and time.  Psychiatric:        Mood and Affect: Mood normal.        Behavior: Behavior normal.        Thought Content: Thought content normal.        Judgment: Judgment normal.        Assessment/Plan:  64 year old female with a prior history of breast lumpectomy and radiation therapy for cancer with recurrent inflammatory masses consistent with fat necrosis and some granulomatous component.  She has tried conservative management for over a year and is very frustrated.  I had an extensive discussion with the patient regarding options.  We typically do not perform lumpectomies in the circumstances but the patient is adamant that she is concerned that there may be some hidden nidus of cancer and she cannot live with this chronic induration and pain.  I have also explained to her that even if we do a lumpectomy the pain may persist as well as infection.  Patient is adamant that she wishes to have more definitive form of therapy given that everything else has failed.  Surgical option discussed with the patient in detail. Specifically doing a lumpectomy of the inflammatory area.  She is in agreement.  Discussed with patient detail.  Risk, benefits and possible complications including but not limited to: Bleeding, infection, recurrence of symptoms, upgrading the pathology results and chronic pain.  She understands and wished to proceed   Greater than 50% of the 40 minutes  visit was spent in counseling/coordination of care   [name], MD [place] Surgeon

## 2019-06-22 ENCOUNTER — Other Ambulatory Visit: Payer: Self-pay | Admitting: Urology

## 2019-06-22 DIAGNOSIS — N361 Urethral diverticulum: Secondary | ICD-10-CM

## 2019-06-22 DIAGNOSIS — R102 Pelvic and perineal pain: Secondary | ICD-10-CM

## 2019-06-23 ENCOUNTER — Ambulatory Visit: Payer: Medicaid Other

## 2019-06-23 ENCOUNTER — Ambulatory Visit: Admission: RE | Admit: 2019-06-23 | Payer: Medicaid Other | Source: Ambulatory Visit

## 2019-06-27 ENCOUNTER — Telehealth: Payer: Self-pay | Admitting: Surgery

## 2019-06-27 NOTE — Telephone Encounter (Signed)
Outgoing call made, spoke with daughter, Lenna Sciara and she has been advised of Pre-Admission date/time, COVID Testing date and Surgery date.  Surgery Date: 07/14/19 Preadmission Testing Date: 07/06/19 (phone 8a-1p) Covid Testing Date: 07/12/19 - patient advised to go to the [facility] between 8a-1p  Daughter Lenna Sciara has been made aware to call [phone], between 1-3:00pm the day before surgery, to find out what time to arrive for surgery.

## 2019-07-01 ENCOUNTER — Other Ambulatory Visit: Payer: Self-pay

## 2019-07-01 ENCOUNTER — Ambulatory Visit
Admission: RE | Admit: 2019-07-01 | Discharge: 2019-07-01 | Disposition: A | Discharge status: Home / Self Care | Payer: Medicaid Other | Source: Ambulatory Visit | Attending: Urology | Admitting: Urology

## 2019-07-01 DIAGNOSIS — R102 Pelvic and perineal pain: Secondary | ICD-10-CM | POA: Diagnosis present

## 2019-07-01 DIAGNOSIS — N361 Urethral diverticulum: Secondary | ICD-10-CM | POA: Diagnosis present

## 2019-07-06 ENCOUNTER — Other Ambulatory Visit: Payer: Self-pay

## 2019-07-06 ENCOUNTER — Encounter
Admission: RE | Admit: 2019-07-06 | Discharge: 2019-07-06 | Disposition: A | Discharge status: Home / Self Care | Payer: Medicaid Other | Source: Ambulatory Visit | Attending: Surgery | Admitting: Surgery

## 2019-07-06 DIAGNOSIS — Z01810 Encounter for preprocedural cardiovascular examination: Secondary | ICD-10-CM | POA: Insufficient documentation

## 2019-07-06 DIAGNOSIS — I119 Hypertensive heart disease without heart failure: Secondary | ICD-10-CM | POA: Diagnosis not present

## 2019-07-06 DIAGNOSIS — R9431 Abnormal electrocardiogram [ECG] [EKG]: Secondary | ICD-10-CM | POA: Insufficient documentation

## 2019-07-06 HISTORY — DX: Other specified postprocedural states: Z98.890

## 2019-07-06 HISTORY — DX: Other specified postprocedural states: R11.2

## 2019-07-06 NOTE — Patient Instructions (Addendum)
COVID TESTING Date: Tuesday, April 27 Testing site:  [facility] [address] Thru Hours:  [id number] am - 1:00 pm Once you are tested, you are asked to stay quarantined (avoiding public places) until after your surgery.   Your procedure is scheduled on:  Thursday, April 29 Report to Day Surgery on the 2nd floor of the [facility]. To find out your arrival time, please call [phone] between 1PM - 3PM on: Wednesday, April 28  REMEMBER: Instructions that are not followed completely may result in serious medical risk, up to and including death; or upon the discretion of your surgeon and anesthesiologist your surgery may need to be rescheduled.  Do not eat food after midnight the night before surgery.  No gum chewing, lozengers or hard candies.  You may however, drink CLEAR liquids up to 2 hours before you are scheduled to arrive for your surgery. Do not drink anything within 2 hours of your scheduled arrival time.  Clear liquids include: - water  - apple juice without pulp - gatorade (not RED) - black coffee or tea (Do NOT add milk or creamers to the coffee or tea) Do NOT drink anything that is not on this list.  TAKE THESE MEDICATIONS THE MORNING OF SURGERY WITH A SIP OF WATER:  1.  Levothyroxine  Stop Anti-inflammatories (NSAIDS) such as Advil, Aleve, Ibuprofen, Motrin, Naproxen, Naprosyn and Aspirin based products such as Excedrin, Goodys Powder, BC Powder. (May take Tylenol or Acetaminophen if needed.)  Stop ANY OVER THE COUNTER supplements until after surgery.  On the morning of surgery brush your teeth with toothpaste and water, you may rinse your mouth with mouthwash if you wish. Do not swallow any toothpaste or mouthwash.  Do not wear jewelry, make-up, hairpins, clips or nail polish.  Do not wear lotions, powders, or perfumes.   Do not shave 48 hours prior to surgery.   Do not bring valuables to the hospital. [facility] is not  responsible for any missing/lost belongings or valuables.   Use CHG Soap as directed on instruction sheet.  Notify your doctor if there is any change in your medical condition (cold, fever, infection).  Wear comfortable clothing (specific to your surgery type) to the hospital.  Plan for stool softeners for home use; pain medications have a tendency to cause constipation. You can also help prevent constipation by eating foods high in fiber such as fruits and vegetables and drinking plenty of fluids as your diet allows.  After surgery, you can help prevent lung complications by doing breathing exercises.  Take deep breaths and cough every 1-2 hours. Your doctor may order a device called an Incentive Spirometer to help you take deep breaths. When coughing or sneezing, hold a pillow firmly against your incision with both hands. This is called "splinting." Doing this helps protect your incision. It also decreases belly discomfort.  If you are being discharged the day of surgery, you will not be allowed to drive home. You will need a responsible adult (18 years or older) to drive you home and stay with you that night.   If you are taking public transportation, you will need to have a responsible adult (18 years or older) with you. Please confirm with your physician that it is acceptable to use public transportation.   Please call the [facility] at [phone] if you have any questions about these instructions.  Visitation Policy:  Patients undergoing a surgery or procedure may have one  family member or support person with them as long as that person is not COVID-19 positive or experiencing its symptoms.  That person may remain in the waiting area during the procedure.

## 2019-07-11 ENCOUNTER — Ambulatory Visit: Payer: Medicaid Other | Attending: Internal Medicine

## 2019-07-11 DIAGNOSIS — Z23 Encounter for immunization: Secondary | ICD-10-CM

## 2019-07-11 NOTE — Progress Notes (Signed)
   Covid-19 Vaccination Clinic  Name:  Wanda Reed    MRN: [id number] DOB: 1955/07/07  07/11/2019  [name] was observed post Covid-19 immunization for 15 minutes without incident. She was provided with Vaccine Information Sheet and instruction to access the V-Safe system.   [name] was instructed to call 911 with any severe reactions post vaccine: [name] Difficulty breathing  . Swelling of face and throat  . A fast heartbeat  . A bad rash all over body  . Dizziness and weakness   Immunizations Administered    Name Date Dose VIS Date Route   Pfizer COVID-19 Vaccine 07/11/2019  4:20 PM 0.3 mL 05/11/2018 Intramuscular   Manufacturer: [employer]   Lot: [id number]   [place]: [id number]

## 2019-07-12 ENCOUNTER — Other Ambulatory Visit
Admission: RE | Admit: 2019-07-12 | Discharge: 2019-07-12 | Disposition: A | Discharge status: Home / Self Care | Payer: Medicaid Other | Source: Ambulatory Visit | Attending: Surgery | Admitting: Surgery

## 2019-07-12 ENCOUNTER — Other Ambulatory Visit: Payer: Self-pay

## 2019-07-12 ENCOUNTER — Telehealth: Payer: Self-pay

## 2019-07-12 DIAGNOSIS — Z01812 Encounter for preprocedural laboratory examination: Secondary | ICD-10-CM | POA: Diagnosis present

## 2019-07-12 DIAGNOSIS — Z20822 Contact with and (suspected) exposure to covid-19: Secondary | ICD-10-CM | POA: Diagnosis not present

## 2019-07-12 LAB — SARS CORONAVIRUS 2 (TAT 6-24 HRS): SARS Coronavirus 2: NEGATIVE

## 2019-07-12 MED ORDER — SENNOSIDES-DOCUSATE SODIUM 8.6-50 MG PO TABS: 1.0000 | ORAL_TABLET | Freq: Two times a day (BID) | ORAL | 0 refills | Status: DC

## 2019-07-12 NOTE — Telephone Encounter (Signed)
OK to refill   Thanks Nickolas Madrid, MD 07/12/2019

## 2019-07-12 NOTE — Telephone Encounter (Signed)
Refill sent to the pharmacy. Patient notified.

## 2019-07-12 NOTE — Telephone Encounter (Signed)
Patients daughter Lenna Sciara called and wanted to know about refilling the senna-docusate. There are no refills however daughter states this medication has been helping with patients pain. Patient is scheduled to see you on 07/21/19. Please advise.

## 2019-07-12 NOTE — Telephone Encounter (Signed)
Refill sent, patient notified.

## 2019-07-14 ENCOUNTER — Ambulatory Visit: Payer: Medicaid Other | Admitting: Certified Registered"

## 2019-07-14 ENCOUNTER — Encounter: Payer: Self-pay | Admitting: Surgery

## 2019-07-14 ENCOUNTER — Encounter
Admission: RE | Disposition: A | Discharge status: Home / Self Care | Payer: Self-pay | Source: Home / Self Care | Attending: Surgery

## 2019-07-14 ENCOUNTER — Other Ambulatory Visit: Payer: Self-pay

## 2019-07-14 ENCOUNTER — Ambulatory Visit
Admission: RE | Admit: 2019-07-14 | Discharge: 2019-07-14 | Disposition: A | Discharge status: Home / Self Care | Payer: Medicaid Other | Attending: Surgery | Admitting: Surgery

## 2019-07-14 ENCOUNTER — Ambulatory Visit
Admission: RE | Admit: 2019-07-14 | Discharge: 2019-07-14 | Disposition: A | Discharge status: Home / Self Care | Payer: Medicaid Other | Source: Home / Self Care | Attending: Surgery | Admitting: Surgery

## 2019-07-14 DIAGNOSIS — Z9221 Personal history of antineoplastic chemotherapy: Secondary | ICD-10-CM | POA: Insufficient documentation

## 2019-07-14 DIAGNOSIS — I10 Essential (primary) hypertension: Secondary | ICD-10-CM | POA: Diagnosis not present

## 2019-07-14 DIAGNOSIS — Z923 Personal history of irradiation: Secondary | ICD-10-CM | POA: Diagnosis not present

## 2019-07-14 DIAGNOSIS — N644 Mastodynia: Secondary | ICD-10-CM | POA: Insufficient documentation

## 2019-07-14 DIAGNOSIS — N6310 Unspecified lump in the right breast, unspecified quadrant: Secondary | ICD-10-CM | POA: Diagnosis not present

## 2019-07-14 DIAGNOSIS — Z853 Personal history of malignant neoplasm of breast: Secondary | ICD-10-CM | POA: Diagnosis not present

## 2019-07-14 DIAGNOSIS — F419 Anxiety disorder, unspecified: Secondary | ICD-10-CM | POA: Diagnosis not present

## 2019-07-14 DIAGNOSIS — Z90710 Acquired absence of both cervix and uterus: Secondary | ICD-10-CM | POA: Diagnosis not present

## 2019-07-14 DIAGNOSIS — E039 Hypothyroidism, unspecified: Secondary | ICD-10-CM | POA: Diagnosis not present

## 2019-07-14 DIAGNOSIS — N641 Fat necrosis of breast: Secondary | ICD-10-CM | POA: Diagnosis not present

## 2019-07-14 DIAGNOSIS — Z419 Encounter for procedure for purposes other than remedying health state, unspecified: Secondary | ICD-10-CM

## 2019-07-14 DIAGNOSIS — G8929 Other chronic pain: Secondary | ICD-10-CM | POA: Diagnosis not present

## 2019-07-14 DIAGNOSIS — Z4001 Encounter for prophylactic removal of breast: Secondary | ICD-10-CM

## 2019-07-14 DIAGNOSIS — Z8541 Personal history of malignant neoplasm of cervix uteri: Secondary | ICD-10-CM | POA: Diagnosis not present

## 2019-07-14 HISTORY — PX: RE-EXCISION OF BREAST LUMPECTOMY: SHX6048

## 2019-07-14 SURGERY — EXCISION, LESION, BREAST
Anesthesia: General | Site: Breast | Laterality: Right

## 2019-07-14 MED ORDER — PROPOFOL 10 MG/ML IV BOLUS
INTRAVENOUS | Status: DC | PRN
  Administered 2019-07-14: 50 mg via INTRAVENOUS
  Administered 2019-07-14: 150 mg via INTRAVENOUS

## 2019-07-14 MED ORDER — CELECOXIB 200 MG PO CAPS: 200.0000 mg | ORAL_CAPSULE | ORAL | Status: AC

## 2019-07-14 MED ORDER — CELECOXIB 200 MG PO CAPS
ORAL_CAPSULE | ORAL | Status: AC
  Administered 2019-07-14: 200 mg via ORAL
  Filled 2019-07-14: qty 1

## 2019-07-14 MED ORDER — ACETAMINOPHEN 500 MG PO TABS
ORAL_TABLET | ORAL | Status: AC
  Administered 2019-07-14: 1000 mg via ORAL
  Filled 2019-07-14: qty 2

## 2019-07-14 MED ORDER — GABAPENTIN 300 MG PO CAPS: 300.0000 mg | ORAL_CAPSULE | ORAL | Status: AC

## 2019-07-14 MED ORDER — LIDOCAINE HCL (PF) 2 % IJ SOLN
INTRAMUSCULAR | Status: DC | PRN
  Administered 2019-07-14: 50 mg

## 2019-07-14 MED ORDER — CHLORHEXIDINE GLUCONATE CLOTH 2 % EX PADS: 6.0000 | MEDICATED_PAD | Freq: Once | CUTANEOUS | Status: DC

## 2019-07-14 MED ORDER — DEXAMETHASONE SODIUM PHOSPHATE 10 MG/ML IJ SOLN
INTRAMUSCULAR | Status: AC
  Filled 2019-07-14: qty 1

## 2019-07-14 MED ORDER — MIDAZOLAM HCL 5 MG/5ML IJ SOLN
INTRAMUSCULAR | Status: DC | PRN
  Administered 2019-07-14: 2 mg via INTRAVENOUS

## 2019-07-14 MED ORDER — DEXMEDETOMIDINE HCL IN NACL 200 MCG/50ML IV SOLN
INTRAVENOUS | Status: DC | PRN
  Administered 2019-07-14: 12 ug via INTRAVENOUS

## 2019-07-14 MED ORDER — FENTANYL CITRATE (PF) 100 MCG/2ML IJ SOLN
INTRAMUSCULAR | Status: AC
  Filled 2019-07-14: qty 2

## 2019-07-14 MED ORDER — LIDOCAINE HCL (PF) 2 % IJ SOLN
INTRAMUSCULAR | Status: AC
  Filled 2019-07-14: qty 5

## 2019-07-14 MED ORDER — HYDROCODONE-ACETAMINOPHEN 5-325 MG PO TABS: 1.0000 | ORAL_TABLET | Freq: Four times a day (QID) | ORAL | 0 refills | Status: DC | PRN

## 2019-07-14 MED ORDER — BUPIVACAINE-EPINEPHRINE (PF) 0.25% -1:200000 IJ SOLN
INTRAMUSCULAR | Status: DC | PRN
  Administered 2019-07-14: 30 mL via PERINEURAL

## 2019-07-14 MED ORDER — GABAPENTIN 300 MG PO CAPS
ORAL_CAPSULE | ORAL | Status: AC
  Administered 2019-07-14: 300 mg via ORAL
  Filled 2019-07-14: qty 1

## 2019-07-14 MED ORDER — PHENYLEPHRINE HCL (PRESSORS) 10 MG/ML IV SOLN
INTRAVENOUS | Status: AC
  Filled 2019-07-14: qty 1

## 2019-07-14 MED ORDER — FENTANYL CITRATE (PF) 100 MCG/2ML IJ SOLN: 25.0000 ug | INTRAMUSCULAR | Status: DC | PRN

## 2019-07-14 MED ORDER — ONDANSETRON HCL 4 MG/2ML IJ SOLN
INTRAMUSCULAR | Status: DC | PRN
  Administered 2019-07-14: 4 mg via INTRAVENOUS

## 2019-07-14 MED ORDER — LACTATED RINGERS IV SOLN: INTRAVENOUS | Status: DC

## 2019-07-14 MED ORDER — FENTANYL CITRATE (PF) 100 MCG/2ML IJ SOLN
INTRAMUSCULAR | Status: DC | PRN
  Administered 2019-07-14 (×3): 50 ug via INTRAVENOUS

## 2019-07-14 MED ORDER — PROPOFOL 500 MG/50ML IV EMUL
INTRAVENOUS | Status: AC
  Filled 2019-07-14: qty 50

## 2019-07-14 MED ORDER — GLYCOPYRROLATE 0.2 MG/ML IJ SOLN
INTRAMUSCULAR | Status: AC
  Filled 2019-07-14: qty 1

## 2019-07-14 MED ORDER — BUPIVACAINE LIPOSOME 1.3 % IJ SUSP
INTRAMUSCULAR | Status: DC | PRN
  Administered 2019-07-14: 20 mL

## 2019-07-14 MED ORDER — ONDANSETRON HCL 4 MG/2ML IJ SOLN
INTRAMUSCULAR | Status: AC
  Filled 2019-07-14: qty 2

## 2019-07-14 MED ORDER — PROPOFOL 500 MG/50ML IV EMUL
INTRAVENOUS | Status: DC | PRN
  Administered 2019-07-14: 100 ug/kg/min via INTRAVENOUS

## 2019-07-14 MED ORDER — MIDAZOLAM HCL 2 MG/2ML IJ SOLN
INTRAMUSCULAR | Status: AC
  Filled 2019-07-14: qty 2

## 2019-07-14 MED ORDER — PROMETHAZINE HCL 25 MG/ML IJ SOLN: 6.2500 mg | INTRAMUSCULAR | Status: DC | PRN

## 2019-07-14 MED ORDER — FAMOTIDINE 20 MG PO TABS
ORAL_TABLET | ORAL | Status: AC
  Administered 2019-07-14: 20 mg via ORAL
  Filled 2019-07-14: qty 1

## 2019-07-14 MED ORDER — CEFAZOLIN SODIUM-DEXTROSE 2-4 GM/100ML-% IV SOLN
INTRAVENOUS | Status: AC
  Filled 2019-07-14: qty 100

## 2019-07-14 MED ORDER — PHENYLEPHRINE HCL (PRESSORS) 10 MG/ML IV SOLN
INTRAVENOUS | Status: DC | PRN
  Administered 2019-07-14 (×3): 100 ug via INTRAVENOUS

## 2019-07-14 MED ORDER — ACETAMINOPHEN 500 MG PO TABS: 1000.0000 mg | ORAL_TABLET | ORAL | Status: AC

## 2019-07-14 MED ORDER — GLYCOPYRROLATE 0.2 MG/ML IJ SOLN
INTRAMUSCULAR | Status: DC | PRN
  Administered 2019-07-14: .2 mg via INTRAVENOUS

## 2019-07-14 MED ORDER — DEXAMETHASONE SODIUM PHOSPHATE 10 MG/ML IJ SOLN
INTRAMUSCULAR | Status: DC | PRN
  Administered 2019-07-14: 10 mg via INTRAVENOUS

## 2019-07-14 MED ORDER — FAMOTIDINE 20 MG PO TABS: 20.0000 mg | ORAL_TABLET | Freq: Once | ORAL | Status: AC

## 2019-07-14 MED ORDER — PROPOFOL 10 MG/ML IV BOLUS
INTRAVENOUS | Status: AC
  Filled 2019-07-14: qty 20

## 2019-07-14 MED ORDER — BUPIVACAINE-EPINEPHRINE (PF) 0.25% -1:200000 IJ SOLN
INTRAMUSCULAR | Status: AC
  Filled 2019-07-14: qty 30

## 2019-07-14 MED ORDER — CEFAZOLIN SODIUM-DEXTROSE 2-4 GM/100ML-% IV SOLN
2.0000 g | INTRAVENOUS | Status: AC
  Administered 2019-07-14: 2 g via INTRAVENOUS

## 2019-07-14 SURGICAL SUPPLY — 44 items
APPLIER CLIP 9.375 SM OPEN (CLIP)
BLADE SURG 15 STRL LF DISP TIS (BLADE) ×1 IMPLANT
BLADE SURG 15 STRL SS (BLADE) ×2
CANISTER SUCT 1200ML W/VALVE (MISCELLANEOUS) ×3 IMPLANT
CHLORAPREP W/TINT 26 (MISCELLANEOUS) ×3 IMPLANT
CLIP APPLIE 9.375 SM OPEN (CLIP) ×1 IMPLANT
CNTNR SPEC 2.5X3XGRAD LEK (MISCELLANEOUS) ×1
CONT SPEC 4OZ STER OR WHT (MISCELLANEOUS) ×2
CONTAINER SPEC 2.5X3XGRAD LEK (MISCELLANEOUS) ×1 IMPLANT
COVER PROBE FLX POLY STRL (MISCELLANEOUS) ×3 IMPLANT
COVER WAND RF STERILE (DRAPES) ×3 IMPLANT
DERMABOND ADVANCED (GAUZE/BANDAGES/DRESSINGS) ×2
DERMABOND ADVANCED .7 DNX12 (GAUZE/BANDAGES/DRESSINGS) ×1 IMPLANT
DEVICE DUBIN SPECIMEN MAMMOGRA (MISCELLANEOUS) ×2 IMPLANT
DRAPE INCISE IOBAN 66X45 STRL (DRAPES) ×3 IMPLANT
DRAPE LAPAROTOMY TRNSV 106X77 (MISCELLANEOUS) ×3 IMPLANT
ELECT REM PT RETURN 9FT ADLT (ELECTROSURGICAL) ×3
ELECTRODE REM PT RTRN 9FT ADLT (ELECTROSURGICAL) ×1 IMPLANT
GLOVE BIO SURGEON STRL SZ7 (GLOVE) ×3 IMPLANT
GLOVE INDICATOR 7.5 STRL GRN (GLOVE) ×3 IMPLANT
GOWN STRL REUS W/ TWL LRG LVL3 (GOWN DISPOSABLE) ×2 IMPLANT
GOWN STRL REUS W/TWL LRG LVL3 (GOWN DISPOSABLE) ×4
KIT MARKER MARGIN INK (KITS) IMPLANT
KIT TURNOVER KIT A (KITS) ×3 IMPLANT
LABEL OR SOLS (LABEL) ×3 IMPLANT
MARGIN MAP 10MM (MISCELLANEOUS) ×3 IMPLANT
MARKER MARGIN CORRECT CLIP (MARKER) IMPLANT
NEEDLE HYPO 22GX1.5 SAFETY (NEEDLE) ×3 IMPLANT
PACK BASIN MINOR (MISCELLANEOUS) ×3 IMPLANT
SLEVE PROBE SENORX GAMMA FIND (MISCELLANEOUS) ×1 IMPLANT
SPONGE LAP 18X18 RF (DISPOSABLE) ×3 IMPLANT
SUT ETHILON 3-0 FS-10 30 BLK (SUTURE)
SUT MNCRL 4-0 (SUTURE) ×2
SUT MNCRL 4-0 27XMFL (SUTURE) ×1
SUT SILK 2 0 SH (SUTURE) ×3 IMPLANT
SUT VIC AB 2-0 CT1 (SUTURE) ×1 IMPLANT
SUT VIC AB 2-0 SH 27 (SUTURE) ×6
SUT VIC AB 2-0 SH 27XBRD (SUTURE) IMPLANT
SUT VIC AB 3-0 SH 27 (SUTURE) ×2
SUT VIC AB 3-0 SH 27X BRD (SUTURE) ×2 IMPLANT
SUTURE EHLN 3-0 FS-10 30 BLK (SUTURE) ×1 IMPLANT
SUTURE MNCRL 4-0 27XMF (SUTURE) ×1 IMPLANT
SYR 20ML LL LF (SYRINGE) ×3 IMPLANT
WATER STERILE IRR 1000ML POUR (IV SOLUTION) ×3 IMPLANT

## 2019-07-14 NOTE — Op Note (Signed)
Pre-operative Diagnosis: Hx Right Breast Cancer with chronic pain and fat necrosis   Post-operative Diagnosis: Same   Surgeon: [name],  MD FACS  Anesthesia: General LMA  Procedure: Right Partial mastectomy with margins Complex closure with tissue rearrangements measuring 42cm2   Findings: Clip and lesion within xray specimen   Estimated Blood Loss: Minimal         Drains: None         Specimens: partial mastectomy with 6 labels       Complications: none                 Condition: Stable    Procedure Details  The patient was seen again in the Holding Room. The benefits, complications, treatment options, and expected outcomes were discussed with the patient. The risks of bleeding, infection, recurrence of symptoms, failure to resolve symptoms, hematoma, seroma, open wound, cosmetic deformity, and the need for further surgery were discussed.  The patient was taken to Operating Room, identified  and the procedure verified.  A Time Out was held and the above information confirmed.  Prior to the induction of general anesthesia, antibiotic prophylaxis was administered. VTE prophylaxis was in place. Appropriate anesthesia was then administered and tolerated well. The chest was prepped with Chloraprep and draped in the sterile fashion. The patient was positioned in the supine position.   Attention was turned to the right breast where a periareolar incision was made  in a crescent moon shaped fashion. Dissection around the induration was done to performed a partial mastectomy with adequate margins. This was done with electrocautery and sharp dissection. Hemostasis was with electrocautery. Faxitron was used to confirm that the clip was within the specimen. Specimen was oriented w 6 labels. Tissue advancement flaps were performed to decrease the volume deficit in the area of the resection.  Please note that the total void area was 7 x 6 cm equals 42 cm.  The chest wall flaps were  created by incising the breast parenchyma from the pectoralis fascia in a circumferential method. Liposomal marcaine infiltrated along the chest wall. The breast parenchyma was then reapproximated in a deep to superficial fashion using interrupted 2-0 Vicryl sutures.  Please note that I placed 2 deep layers of 2-0 Vicryl's.  Once assuring that hemostasis was adequate and checked multiple times the wound was closed with 4-0 subcuticular Monocryl sutures. Dermabond was placed  Patient was taken to the recovery room in stable condition.   [name] , MD, FACS

## 2019-07-14 NOTE — Anesthesia Procedure Notes (Signed)
Procedure Name: LMA Insertion Performed by: Rolla Plate, CRNA Pre-anesthesia Checklist: Patient identified, Patient being monitored, Timeout performed, Emergency Drugs available and Suction available Patient Re-evaluated:Patient Re-evaluated prior to induction Oxygen Delivery Method: Circle system utilized Preoxygenation: Pre-oxygenation with 100% oxygen Induction Type: IV induction Ventilation: Mask ventilation without difficulty LMA: LMA inserted LMA Size: 3.0 Tube type: Oral Number of attempts: 1 Placement Confirmation: positive ETCO2 and breath sounds checked- equal and bilateral Tube secured with: Tape Dental Injury: Teeth and Oropharynx as per pre-operative assessment

## 2019-07-14 NOTE — Transfer of Care (Signed)
Immediate Anesthesia Transfer of Care Note  Patient: Wanda Reed  Procedure(s) Performed: RE-EXCISION OF Right BREAST LUMPECTOMY (Right Breast)  Patient Location: PACU  Anesthesia Type:General  Level of Consciousness: sedated  Airway & Oxygen Therapy: Patient Spontanous Breathing and Patient connected to face mask oxygen  Post-op Assessment: Report given to RN and Post -op Vital signs reviewed and stable  Post vital signs: Reviewed  Last Vitals:  Vitals Value Taken Time  BP 130/72 07/14/19 0844  Temp    Pulse 66 07/14/19 0844  Resp 11 07/14/19 0844  SpO2 99 % 07/14/19 0844  Vitals shown include unvalidated device data.  Last Pain:  Vitals:   07/14/19 0620  TempSrc: Temporal  PainSc: 0-No pain         Complications: No apparent anesthesia complications

## 2019-07-14 NOTE — Discharge Instructions (Addendum)

## 2019-07-14 NOTE — Anesthesia Preprocedure Evaluation (Signed)
Anesthesia Evaluation  Patient identified by MRN, date of birth, ID band Patient awake    Reviewed: Allergy & Precautions, NPO status , Patient's Chart, lab work & pertinent test results  History of Anesthesia Complications (+) PONV and history of anesthetic complications  Airway Mallampati: III  TM Distance: >3 FB Neck ROM: Full    Dental no notable dental hx.    Pulmonary neg pulmonary ROS, neg sleep apnea, neg COPD,    breath sounds clear to auscultation- rhonchi (-) wheezing      Cardiovascular hypertension, Pt. on medications (-) CAD, (-) Past MI and (-) Cardiac Stents  Rhythm:Regular Rate:Normal - Systolic murmurs and - Diastolic murmurs    Neuro/Psych Anxiety negative neurological ROS     GI/Hepatic negative GI ROS, Neg liver ROS,   Endo/Other  negative endocrine ROSneg diabetesHypothyroidism   Renal/GU negative Renal ROS     Musculoskeletal negative musculoskeletal ROS (+)   Abdominal (+) - obese,   Peds  Hematology negative hematology ROS (+)   Anesthesia Other Findings Past Medical History: No date: Anxiety 08/2016: Breast cancer of upper-outer quadrant of right*     Comment: 1.2 cm ER+, PR+ Her 2 neu not overexpressed.  No date: Cancer [facility])     Comment: cervical No date: Hypertension No date: Thyroid disease   Reproductive/Obstetrics                             Anesthesia Physical  Anesthesia Plan  ASA: II  Anesthesia Plan: General   Post-op Pain Management:    Induction: Intravenous  PONV Risk Score and Plan: 4 or greater and Ondansetron, Dexamethasone, Propofol and Midazolam  Airway Management Planned: LMA  Additional Equipment:   Intra-op Plan:   Post-operative Plan: Extubation in OR  Informed Consent: I have reviewed the patients History and Physical, chart, labs and discussed the procedure including the risks, benefits and alternatives for the  proposed anesthesia with the patient or authorized representative who has indicated his/her understanding and acceptance.     Dental advisory given  Plan Discussed with: CRNA and Anesthesiologist  Anesthesia Plan Comments:         Anesthesia Quick Evaluation

## 2019-07-14 NOTE — Interval H&P Note (Signed)
History and Physical Interval Note:  07/14/2019 7:19 AM  Wanda Reed  has presented today for surgery, with the diagnosis of Fat necrosis of right breast.  The various methods of treatment have been discussed with the patient and family. After consideration of risks, benefits and other options for treatment, the patient has consented to  Procedure(s): RE-EXCISION OF Right BREAST LUMPECTOMY (Right) as a surgical intervention.  The patient's history has been reviewed, patient examined, no change in status, stable for surgery.  I have reviewed the patient's chart and labs.  Questions were answered to the patient's satisfaction.     [place]

## 2019-07-15 LAB — SURGICAL PATHOLOGY

## 2019-07-15 NOTE — Anesthesia Postprocedure Evaluation (Signed)
Anesthesia Post Note  Patient: Wanda Reed  Procedure(s) Performed: RE-EXCISION OF Right BREAST LUMPECTOMY (Right Breast)  Patient location during evaluation: PACU Anesthesia Type: General Level of consciousness: awake and alert Pain management: pain level controlled Vital Signs Assessment: post-procedure vital signs reviewed and stable Respiratory status: spontaneous breathing, nonlabored ventilation, respiratory function stable and patient connected to nasal cannula oxygen Cardiovascular status: blood pressure returned to baseline and stable Postop Assessment: no apparent nausea or vomiting Anesthetic complications: no     Last Vitals:  Vitals:   07/14/19 0911 07/14/19 0918  BP: (!) 121/58 129/65  Pulse: 72 79  Resp: 14 20  Temp:  (!) 36.1 C  SpO2: 96% 100%    Last Pain:  Vitals:   07/15/19 0822  TempSrc:   PainSc: 4                  Martha Clan

## 2019-07-21 ENCOUNTER — Ambulatory Visit: Payer: Self-pay | Admitting: Urology

## 2019-07-21 ENCOUNTER — Encounter: Payer: Self-pay | Admitting: Urology

## 2019-07-27 ENCOUNTER — Ambulatory Visit (INDEPENDENT_AMBULATORY_CARE_PROVIDER_SITE_OTHER): Payer: Medicaid Other | Admitting: Surgery

## 2019-07-27 ENCOUNTER — Other Ambulatory Visit: Payer: Self-pay

## 2019-07-27 ENCOUNTER — Encounter: Payer: Self-pay | Admitting: Surgery

## 2019-07-27 VITALS — BP 139/82 | HR 76 | Temp 97.3°F | Ht 60.0 in | Wt 154.6 lb

## 2019-07-27 DIAGNOSIS — Z09 Encounter for follow-up examination after completed treatment for conditions other than malignant neoplasm: Secondary | ICD-10-CM

## 2019-07-27 NOTE — Patient Instructions (Addendum)
Autoexamen de [employer] Breast Self-Awareness El autoexamen de mamas es para Civil engineer, contracting la apariencia y la sensibilidad de las [place]. Es importante autoexaminarse las [place]. Permite detectar un problema en las mamas a tiempo mientras todava es pequeo y puede tratarse. Todas las mujeres deben autoexaminarse las [place], incluso aquellas que se sometieron a implantes mamarios. Informe al mdico si advierte un cambio en las mamas. Lo que necesita:  Un espejo.  Una habitacin bien iluminada. Cmo realizar el autoexamen de mamas El autoexamen de [place] es una forma de aprender qu es normal para sus mamas y revisar si hay cambios. Para hacer un autoexamen de las mamas: Busque cambios  1. Qutese toda la ropa por encima de la cintura. 2. Prese frente a un espejo en una habitacin con buena iluminacin. 3. Apoye las manos en las caderas. 4. Empuje hacia abajo con las manos. [place] y los pezones en el espejo para ver si una mama o un pezn se ve diferente del otro. Durante el examen, intente determinar si: ? La forma de una mama es diferente. ? El tamao de [place] mama es [place]. ? Hay arrugas, depresiones y protuberancias en [place] y no en la otra. 6. Observe cada mama para buscar cambios en la piel, por ejemplo: ? Enrojecimiento. ? Zonas escamosas. 7. Observe si hay cambios en los pezones, por ejemplo: ? Lquido alrededor [employer]. ? [place]. ? Hoyuelos. ? Enrojecimiento. ? Un cambio en el lugar de los pezones. [place] si hay cambios  1. Acustese en el piso boca arriba. 2. Plpese cada mama. Para hacerlo, siga estos pasos: ? Elija una mama para palpar. ? Coloque el brazo ms cercano a esa mama por encima de la cabeza. ? Use el otro brazo para palpar la zona del pezn de la mama. Plpese la zona con las yemas de los tres dedos del medio y haga crculos con los dedos. Con el primer crculo, presione suavemente. Con el segundo, ms fuerte. Con el tercero, an ms  fuerte. ? Siga haciendo crculos con los dedos con las diferentes presiones a medida que desciende por la mama. Detngase cuando sienta las costillas. ? Desplace los dedos un poco hacia el centro del cuerpo. ? Empiece a hacer crculos con los dedos nuevamente y esta vez haga movimientos ascendentes hasta llegar a la clavcula. ? Siga haciendo crculos [place] y [place] llegar a la axila. Recuerde hacerlos con las tres presiones. ? Plpese la otra mama de la misma forma. 3. Sintese o prese en la ducha o la baera. 4. Con agua jabonosa en la piel, plpese cada mama del mismo modo que lo hizo en el paso2 mientras estaba acostada en el piso. Anote sus hallazgos Anotar lo que encuentra puede ayudarla a recordar qu contarle al mdico. [place] los siguientes datos:  Qu es normal para cada mama.  Cualquier cambio que encuentre en cada mama, por ejemplo: ? La clase de cambios que encuentra. ? Si tiene dolor. ? Si hay bultos, su tamao y [place].  Cundo tuvo su ltimo perodo menstrual. Consejos generales  [facility].  Si est amamantando, el mejor momento para el examen de las mamas es despus de darle de Administrator, arts al beb o despus de usar un sacaleches.  Si tiene perodos menstruales, el mejor momento para hacerlo es de 5 a 7das despus de la finalizado el perodo menstrual.  Con el tiempo, se sentir cmoda con el autoexamen y Medical laboratory scientific officer a saber si  hay cambios en sus mamas. Comunquese con un mdico si:  Observa un cambio en la forma o el tamao de las mamas o los pezones.  Observa un cambio en la piel de las mamas o los pezones, como la piel enrojecida o escamosa.  Tiene secrecin de lquido proveniente de los pezones que no es normal.  [place] un ndulo o una zona engrosada que no tena antes.  Tiene dolor en las [place].  Tiene alguna inquietud [employer] de la [place]. Resumen  El autoexamen de mamas incluye buscar cambios en las  [place], y tambin palpar para Hydrographic surveyor cualquier cambio en las [place].  El autoexamen de mamas debe hacerse frente a un espejo en una habitacin bien iluminada.  Debe revisarse las [employer]. Si tiene perodos menstruales, el mejor momento para hacerlo es de 5 a 7das despus de la finalizado el perodo menstrual.  Informe al mdico si ve cambios en las mamas, como cambios en el tamao, cambios en la piel, Social research officer, government o sensibilidad, o un lquido inusual que sale de los pezones. Esta informacin no tiene Marine scientist el consejo del mdico. Asegrese de hacerle al mdico cualquier pregunta que tenga. Document Revised: 12/01/2017 Document Reviewed: 12/01/2017 Elsevier Patient Education  [employer]. We will see you back in 3 months for a follow up. We will send you out a letter once our schedule is open

## 2019-07-27 NOTE — Progress Notes (Signed)
S/p Right lumpectomy for Fat necrosis Doing very well  Significant improvement, mild pain No fevers Path d/w pt  PE NAD Wound healing well, no infection  A/P Doing well w/o complications rtc 3 monhts

## 2019-07-28 ENCOUNTER — Encounter: Payer: Self-pay | Admitting: Urology

## 2019-07-28 ENCOUNTER — Other Ambulatory Visit: Payer: Self-pay

## 2019-07-28 ENCOUNTER — Ambulatory Visit (INDEPENDENT_AMBULATORY_CARE_PROVIDER_SITE_OTHER): Payer: Medicaid Other | Admitting: Urology

## 2019-07-28 VITALS — BP 113/71 | HR 80 | Ht 63.0 in | Wt 153.0 lb

## 2019-07-28 DIAGNOSIS — R300 Dysuria: Secondary | ICD-10-CM | POA: Diagnosis not present

## 2019-07-28 DIAGNOSIS — N390 Urinary tract infection, site not specified: Secondary | ICD-10-CM | POA: Diagnosis not present

## 2019-07-28 DIAGNOSIS — R102 Pelvic and perineal pain: Secondary | ICD-10-CM

## 2019-07-28 DIAGNOSIS — N3281 Overactive bladder: Secondary | ICD-10-CM

## 2019-07-28 LAB — MICROSCOPIC EXAMINATION
Bacteria, UA: NONE SEEN
WBC, UA: NONE SEEN /hpf (ref 0–5)

## 2019-07-28 LAB — URINALYSIS, COMPLETE
Bilirubin, UA: NEGATIVE
Glucose, UA: NEGATIVE
Ketones, UA: NEGATIVE
Leukocytes,UA: NEGATIVE
Nitrite, UA: NEGATIVE
Protein,UA: NEGATIVE
Specific Gravity, UA: <1.005 — ABNORMAL LOW (ref 1.005–1.030)
Urobilinogen, Ur: 0.2 mg/dL (ref 0.2–1.0)
pH, UA: 6 (ref 5.0–7.5)

## 2019-07-28 NOTE — Patient Instructions (Signed)
Vejiga hiperactiva en adultos  Overactive Bladder, Adult    Vejiga hiperactiva se refiere a una afeccin en la que la persona tiene una necesidad sbita de orinar. La persona puede tener una prdida de orina si no puede llegar al bao con la rapidez suficiente (incontinencia urinaria). Una persona con esta afeccin tambin puede despertarse varias veces durante la noche para ir al bao.  La vejiga hiperactiva est asociada con seales nerviosas deficientes entre la vejiga y el cerebro. La vejiga puede recibir la seal de vaciarse antes de que est llena. Usted tambin puede tener msculos muy sensibles que hacen que la vejiga se contraiga demasiado pronto. Estos sntomas pueden interferir en el trabajo diario o las actividades sociales.  Cules son las causas?  Esta afeccin puede estar relacionada con, o ser provocada por:   Infeccin de las vas urinarias.   Infeccin de los tejidos cercanos, como la prstata.   Agrandamiento de la prstata.   Ciruga en el tero o la uretra.   Clculos en la vejiga, inflamacin o tumores.   Consumir cafena o alcohol en exceso.   Ciertos medicamentos, especialmente los medicamentos para eliminar el exceso de lquido del cuerpo (diurticos).   Debilidad de los msculos y nervios, especialmente a causa de lo siguiente:  ? Lesin en la mdula espinal.  ? Accidente cerebrovascular.  ? Esclerosis mltiple.  ? La enfermedad de Parkinson.   Diabetes.   Estreimiento.  Qu incrementa el riesgo?  Puede correr un mayor riesgo de desarrollar vejiga hiperactiva si usted:   Es un adulto mayor.   Fuma.   Est atravesando la menopausia.   Tiene problemas de prstata.   Tiene una enfermedad neurolgica, como accidente cerebrovascular, demencia, enfermedad de Parkinson o esclerosis mltiple (EM).   Ingiere alimentos o bebidas que irritan la vejiga. Entre ellos se incluyen el alcohol, los alimentos picantes y la cafena.   Tiene sobrepeso u obesidad.  Cules son los signos o  los sntomas?  Los sntomas de esta afeccin incluyen los siguientes:   Una urgencia repentina e intensa de orinar.   Prdida de orina.   Orina 8 o ms veces por da.   Levantarse para orinar 2 o ms veces por noche.  Cmo se diagnostica?  El mdico puede sospechar la presencia de vejiga hiperactiva en funcin de los sntomas que presente. El mdico diagnosticar esta afeccin mediante:   Un examen fsico y antecedentes mdicos.   Anlisis de sangre u orina. Es posible que necesite anlisis de orina o pruebas en la vejiga para ayudar a determinar cul es la causa de su vejiga hiperactiva.  Es posible que tambin tenga que consultar a un mdico especialista en enfermedades de las vas urinarias (urlogo).  Cmo se trata?  El tratamiento para el trastorno de vejiga hiperactiva depende de la causa y la gravedad de su enfermedad. Tambin puede hacer cambios en su estilo de vida en su casa. Entre las opciones se incluyen las siguientes:   Entrenamiento de la vejiga. Esto puede incluir lo siguiente:  ? Aprender a controlar la necesidad urgente de orinar siguiendo un programa que lo obliga a orinar en intervalos regulares (vaciamiento cronometrado).  ? Hacer ejercicios de Kegel para fortalecer los msculos del piso plvico que sostienen la vejiga. La tonificacin de estos msculos puede ayudarlo a controlar las micciones, aun si hay hiperactividad en los msculos de la vejiga.   Dispositivos especiales. Esto puede incluir lo siguiente:  ? Biorretroalimentacin, que utiliza sensores para ayudarlo a estar atento a   que calza en la vagina y sostiene la vejiga (pesario).  Medicamentos. ? Antibiticos para  tratar las infecciones en la vejiga. ? Antiespasmdicos para evitar que la vejiga elimine orina en el momento incorrecto. ? Antidepresivos tricclicos para relajar los msculos de la vejiga. ? Inyecciones de toxina botulnica tipo A directamente en el tejido de la vejiga para relajar los msculos de la vejiga.  Cambios en el estilo de vida. Esto puede incluir lo siguiente: ? Prdida de peso. Hable con su mdico sobre los mtodos para perder peso que funcionaran mejor para usted. ? Cambios en la dieta. Esto puede incluir la reduccin de la cantidad de alcohol y de cafena que consume, o beber lquidos en distintos momentos del da. ? No fumar. No consuma ningn producto que contenga nicotina o tabaco, como cigarrillos y Psychologist, sport and exercise. Si necesita ayuda para dejar de fumar, consulte al mdico.  Sims. ? Puede implantarse un dispositivo para ayudar a Chief Technology Officer las seales nerviosas que controlan la miccin. ? Puede implantarse un electrodo para estimular las seales elctricas en la vejiga. ? Puede realizarse un procedimiento para cambiar la forma de la vejiga. Esto solo se Merchandiser, retail graves. Siga estas indicaciones en su casa: Estilo de vida  Modifique su dieta o estilo de vida como se lo haya recomendado su mdico. Esto pueden incluir lo siguiente: ? Beba menos cantidad de lquido o beber lquido en distintos momentos del da. ? Reduzca la ingesta de cafena o alcohol. ? [employer] ejercicios de Kegel. ? Baje de peso, si es necesario. ? Ingiera una dieta saludable y equilibrada para English as a second language teacher estreimiento. Esto puede incluir lo siguiente:  Consuma alimentos ricos en fibra, como frutas y verduras frescas, cereales integrales y frijoles.  Limite el consumo de alimentos ricos en grasas y azcares procesados, como alimentos fritos o dulces. Instrucciones generales  [employer] de venta libre y los recetados solamente como se lo haya indicado el mdico.  Si  le recetaron un antibitico, tmelo como se lo haya indicado el mdico. No deje de tomar el antibitico aunque comience a sentirse mejor.  Use los implantes o el pesario como se lo haya indicado el mdico.  Si es necesario, use apsitos para Tax adviser cualquier prdida de orina que pueda [place].  Lleve un diario o libro de anotaciones para registrar la cantidad de lquidos que ingiere y cundo lo hace, y cundo siente necesidad de Garment/textile technologist. Esto ayudar a su mdico a Retail buyer.  Concurra a todas las visitas de control como se lo haya indicado el mdico. Esto es importante. Comunquese con un mdico si:  Tiene fiebre.  Los sntomas no mejoran con Dispensing optician.  El dolor y [place].  Tiene necesidad urgente de orinar con mayor frecuencia. Solicite ayuda de inmediato si:  No puede controlar la vejiga. Resumen  Vejiga hiperactiva se refiere a Engineering geologist en la que la persona tiene una necesidad sbita de Garment/textile technologist.  Varias afecciones pueden causar sntomas de vejiga hiperactiva.  El tratamiento para la vejiga hiperactiva depende de la causa y la gravedad de la afeccin.  Siga las instrucciones del mdico acerca de hacer cambios en el estilo de vida, hacer los ejercicios de Kegel, llevar un diario y tomar los medicamentos. Esta informacin no tiene Marine scientist el consejo del mdico. Asegrese de hacerle al mdico cualquier pregunta que tenga. Document Revised: 05/13/2017 Document Reviewed: 05/13/2017 Elsevier Patient Education  [place] en los adultos Urinary  Tract Infection, Adult  Una infeccin urinaria (IU) puede ocurrir en Clinical cytogeneticist de las vas [place]. Las vas urinarias incluyen a los riones, los urteres, la vejiga y Geologist, engineering. Estos rganos fabrican, Buyer, retail y eliminan la orina del organismo. El mdico puede usar otras palabras para describir la infeccin. La IU alta afecta los urteres y a los  riones (pielonefritis). La IU baja afecta la vejiga (cistitis) y Geologist, engineering (uretritis). Cules son las causas? La mayora de las infecciones de las vas urinarias es causada por la presencia de bacterias en la zona genital, alrededor de la entrada de las vas urinarias (uretra). Estas bacterias proliferan y causan inflamacin en las vas urinarias. Qu incrementa el riesgo? Es ms probable que contraiga esta afeccin si:  Tiene colocado un catter urinario (sonda urinaria) [place].  No puede controlar cundo Production assistant, radio (tiene incontinencia).  Es mujer y usted: ? [place] espermicida o diafragma como mtodo anticonceptivo. ? Tiene niveles bajos de estrgenos. ? Estn embarazadas.  Tiene ciertos genes que aumentan su riesgo (gentica).  Es sexualmente activa.  Toma antibiticos.  Tiene una afeccin que causa que el flujo de orina sea [place], como: ? Prstata agrandada, si usted es hombre. ? Obstruccin de la uretra (estenosis). ? Clculo renal. ? Una afeccin nerviosa que afecta el control de la vejiga (vejiga neurgena). ? No bebe lo suficiente o no orina con frecuencia.  Tiene ciertas enfermedades crnicas, como: ? Diabetes. ? Un sistema que combate las enfermedades (sistemainmunitario) debilitado. ? Anemia drepanoctica. ? Gota. ? Lesin en la mdula espinal. Cules son los signos o los sntomas? Los sntomas de esta afeccin incluyen:  Necesidad inmediata (urgente) de Garment/textile technologist.  Miccin frecuente o eliminacin de pequeas cantidades de orina con frecuencia.  Ardor o dolor al [employer].  Presencia de [employer].  Orina con mal olor u [employer]AA atpico.  Dificultad para Garment/textile technologist.  Bennie Hind turbia.  Secrecin vaginal, si es mujer.  Dolor en el abdomen o en la parte inferior de la espalda. Es posible que tambin tenga:  Vmitos o disminucin del apetito.  Confusin.  Irritabilidad o cansancio.  Cristy Hilts.  Diarrea. El Psychologist, educational sntoma en los adultos  mayores puede ser la confusin. En algunos casos, es posible que no tengan sntomas hasta que la infeccin empeore. Cmo se diagnostica? Esta afeccin se diagnostica en funcin de sus antecedentes mdicos y de un examen fsico. Tambin pueden hacerle otras pruebas, incluidas las siguientes:  Anlisis de [place].  Anlisis de [place].  Pruebas de infecciones de transmisin sexual (ITS). Si ha tenido ms de una infeccin urinaria (IU), se pueden hacer estudios de diagnstico por imgenes o una cistoscopia para determinar la causa de las infecciones. Cmo se trata? El tratamiento de esta afeccin incluye lo siguiente:  Antibiticos.  Medicamentos de [place] molestias.  Beber una cantidad suficiente agua para mantenerse hidratado. Si tiene infecciones con frecuencia o tiene otras afecciones, como un clculo renal, es posible que deba ver a un mdico especialista en las vas urinarias (urlogo). En casos poco frecuentes, las infecciones urinarias pueden provocar sepsis. La sepsis es una afeccin potencialmente mortal que se produce cuando el cuerpo responde a una infeccin. La sepsis se trata en el hospital con antibiticos, lquidos y otros medicamentos que se administran por va intravenosa. Sigue estas instrucciones en tu casa:  Medicamentos  Toma los medicamentos de venta libre y los recetados solamente como se lo haya indicado el mdico.  Si le recetaron un antibitico, tmelo como se  lo haya indicado el mdico. No deje de usar el antibitico aunque comience a Sports administrator. Indicaciones generales  Asegrese de hacer lo siguiente: ? Vaciar la vejiga con frecuencia y en su totalidad. No contener la orina durante largos perodos. ? Vaciar la vejiga despus de [employer]. ? Limpiarse de adelante hacia atrs despus de defecar, si es mujer. Use cada trozo de papel higinico solo una vez cuando se limpie.  Beba suficiente lquido como para Theatre manager la orina de color  amarillo plido.  Concurrir a todas las visitas de seguimiento como se lo haya indicado el mdico. Esto es importante. Comuncate con un mdico si:  Los sntomas no mejoran despus de 1 o 2das de tratamiento.  Los sntomas desaparecen y luego vuelven a Arts administrator. Solicite ayuda inmediatamente si tiene:  Dolor intenso en la espalda o en la parte inferior del abdomen.  Cristy Hilts.  Nuseas o vmitos. Resumen  Una infeccin urinaria (IU) es una infeccin en cualquier parte de las vas urinarias, que [employer] riones, los urteres, la vejiga y Geologist, engineering.  La mayora de las infecciones de las vas urinarias es causada por la presencia de bacterias en la zona genital, alrededor de la entrada de las vas urinarias (uretra).  El tratamiento de esta afeccin suele incluir antibiticos.  Si le recetaron un antibitico, tmelo como se lo haya indicado el mdico. No deje de usar el antibitico aunque comience a Sports administrator.  Concurrir a todas las visitas de seguimiento como se lo haya indicado el mdico. Esto es importante. Esta informacin no tiene Marine scientist el consejo del mdico. Asegrese de hacerle al mdico cualquier pregunta que tenga. Document Revised: 02/24/2018 Document Reviewed: 02/24/2018 Elsevier Patient Education  2020 [employer].

## 2019-07-28 NOTE — Progress Notes (Signed)
   07/28/2019 1:43 PM   Wanda Reed 01/09/1956 [id number]  Reason for visit: Follow up dysuria, OAB, pelvic pain  HPI: I saw Wanda Reed back in urology clinic for follow-up of the above issues.  She is a 64 year old female I originally met in December 2020 when she was having severe pelvic pain and dysuria despite multiple negative urine cultures.  She also has stress and urge incontinence.  She was also having some painful intercourse at that time and dysuria after sexual activity.  I recommended a pelvic MRI to evaluate for urethral diverticulum, however this was denied by insurance despite a peer to peer conversation, and ultimately she underwent a transvaginal ultrasound which was benign.  At our last visit in April 2021 she was having abdominal pain and some right-sided abdominal pain, a CT had been read as a possible ureteral stone, however on my review of this the stone was a phlebolith outside the ureter on delayed films.  She does have a history of a hysterectomy and there are multiple clips low in the pelvis near the right UVJ.  She was also having severe constipation at that time and I recommended starting with managing her constipation.  Once her constipation was treated with over-the-counter medications and she was having regular bowel movements, her abdominal pain and right-sided pain completely resolved.  -Urinalysis and culture today to evaluate for possible UTI, call with results -Trial of Myrbetriq 50 mg daily for OAB symptoms -Cranberry tablets daily for UTI prevention -RTC 4 to 6 weeks with PA for symptom check.  Would avoid anticholinergics with her history of severe constipation and abdominal pain -If worsening right-sided pain or recurrent UTIs in the future, could consider cystoscopy and right retrograde pyelogram to evaluate for possible scarring or stricture at the right UVJ from prior hysterectomy   [place], [place] [address] [place], [place] [address] [phone]

## 2019-07-31 LAB — CULTURE, URINE COMPREHENSIVE

## 2019-09-01 ENCOUNTER — Other Ambulatory Visit: Payer: Self-pay

## 2019-09-01 ENCOUNTER — Encounter: Payer: Self-pay | Admitting: Urology

## 2019-09-01 ENCOUNTER — Ambulatory Visit (INDEPENDENT_AMBULATORY_CARE_PROVIDER_SITE_OTHER): Payer: Medicaid Other | Admitting: Urology

## 2019-09-01 VITALS — BP 146/78 | HR 80 | Ht 60.0 in | Wt 154.7 lb

## 2019-09-01 DIAGNOSIS — K5909 Other constipation: Secondary | ICD-10-CM | POA: Diagnosis not present

## 2019-09-01 DIAGNOSIS — N3281 Overactive bladder: Secondary | ICD-10-CM

## 2019-09-01 LAB — BLADDER SCAN AMB NON-IMAGING: Scan Result: 75

## 2019-09-01 MED ORDER — SENNOSIDES-DOCUSATE SODIUM 8.6-50 MG PO TABS: 1.0000 | ORAL_TABLET | Freq: Two times a day (BID) | ORAL | 0 refills | Status: DC

## 2019-09-01 MED ORDER — MIRABEGRON ER 25 MG PO TB24
25.0000 mg | ORAL_TABLET | Freq: Every day | ORAL | 12 refills | Status: DC
Start: 2019-09-01 — End: 2020-05-04

## 2019-09-01 NOTE — Progress Notes (Signed)
09/01/2019 4:55 PM   Wanda Reed [birth year] [id number]  Referring provider: Denton Lank, MD [address] [place],  [place] [address]  Chief Complaint  Patient presents with  . Over Active Bladder    HPI: Wanda Reed is a 64 y.o. female with dysuria, OAB and pelvic pain who presents today for a follow up after a trial of Myrbetriq with interpreter, Wanda Reed, through language line.  She has been followed by Dr. Diamantina Providence for her urinary issues.  She last saw him on 07/28/2019 and was placed on Myrbetriq 50 mg daily for her symptoms.    The patient is  experiencing urgency x 4-7, frequency x 4-7, is restricting fluids to avoid visits to the restroom, is engaging in toilet mapping, incontinence x 0-3 and nocturia x 0-3.   Her BP is 146/78.   Her PVR is 75 mL   She has suprapubic pressure, but this has improved since taking the Myrbetriq.     Patient denies any modifying or aggravating factors.  Patient denies any gross hematuria, dysuria or suprapubic/flank pain.  Patient denies any fevers, chills, nausea or vomiting.   Her right sided pain has abated and she has not had an UTI since her last visit with [place].      PMH: Past Medical History:  Diagnosis Date  . Anxiety   . Breast cancer ([place]) 07/2016   right breast lumpectomy with chemo and mammosite  . Breast cancer of upper-outer quadrant of right female breast ([place]) 08/2016   1.0 cm ER+, PR-, Her 2 neu not overexpressed. Node negative. T1b, N0.  [name] Cervical cancer ([place]) 1990  . Hypertension   . Hypothyroidism   . Personal history of chemotherapy 11/2016-12/2016   right breast ca  . Personal history of radiation therapy 09/15/2016   mammosite  . PONV (postoperative nausea and vomiting)   . Thyroid disease     Surgical History: Past Surgical History:  Procedure Laterality Date  . ABDOMINAL HYSTERECTOMY    . BREAST BIOPSY Right 08/08/2016   invasive mammary carcinoma  . BREAST BIOPSY Right 06/01/2019    bx path pending and abbcess drainage  . BREAST LUMPECTOMY Right 08/28/2016   invasive mammary carcinoma, clear margins, negative lymph nodes  . BREAST LUMPECTOMY WITH SENTINEL LYMPH NODE BIOPSY Right 08/28/2016   Procedure: BREAST LUMPECTOMY WITH SENTINEL LYMPH NODE BX;  Surgeon: [name], MD;  Location: [facility] ORS;  Service: General;  Laterality: Right;  . BREAST MAMMOSITE Right 09/15/2016   Procedure: MAMMOSITE BREAST;  Surgeon: [name], MD;  Location: [facility] ORS;  Service: General;  Laterality: Right;  . BREAST SURGERY Right    Breast Biopsy  . CERVIX SURGERY    . COLONOSCOPY  2008  . EYE SURGERY Bilateral    Cataract Extraction with IOL  . PORTA CATH INSERTION Right 11/20/2016   Procedure: PORTA CATH INSERTION;  Surgeon: [name], MD;  Location: [facility] ORS;  Service: General;  Laterality: Right;  . PORTA CATH REMOVAL  2018  . RE-EXCISION OF BREAST LUMPECTOMY Right 07/14/2019   Procedure: RE-EXCISION OF Right BREAST LUMPECTOMY;  Surgeon: Jules Husbands, MD;  Location: [facility] ORS;  Service: General;  Laterality: Right;  . SHOULDER ARTHROSCOPY Right 2012   torn ligaments    Home Medications:  Allergies as of 09/01/2019   No Known Allergies     Medication List       Accurate as of September 01, 2019  4:55 PM. If you have any  questions, ask your nurse or doctor.        acyclovir 400 MG tablet Commonly known as: ZOVIRAX Take 400 mg by mouth 3 (three) times daily as needed (outbreaks).   HYDROcodone-acetaminophen 5-325 MG tablet Commonly known as: NORCO/VICODIN Take 1-2 tablets by mouth every 6 (six) hours as needed for moderate pain.   letrozole 2.5 MG tablet Commonly known as: FEMARA TAKE 1 TABLET BY MOUTH ONCE DAILY. What changed: when to take this   levothyroxine 100 MCG tablet Commonly known as: SYNTHROID Take 100 mcg by mouth daily before breakfast.   lisinopril 10 MG tablet Commonly known as: ZESTRIL Take 10 mg by mouth daily.   mirabegron ER 25  MG Tb24 tablet Commonly known as: MYRBETRIQ Take 1 tablet (25 mg total) by mouth daily. Started by: Zara Council, PA-C   MULTIPLE VITAMIN PO Take 1 tablet by mouth daily.   senna-docusate 8.6-50 MG tablet Commonly known as: Senokot-S Take 1 tablet by mouth 2 (two) times daily.       Allergies: No Known Allergies  Family History: Family History  Problem Relation Age of Onset  . Breast cancer Neg Hx     Social History:  reports that she has never smoked. She has never used smokeless tobacco. She reports that she does not drink alcohol and does not use drugs.  ROS: Pertinent ROS in HPI  Physical Exam: BP (!) 146/78   Pulse 80   Ht 5' (1.524 m)   Wt 154 lb 11.2 oz (70.2 kg)   BMI 30.21 kg/m   Constitutional:  Well nourished. Alert and oriented, No acute distress. HEENT: [name], mask in place.  Trachea midline Cardiovascular: No clubbing, cyanosis, or edema. Respiratory: Normal respiratory effort, no increased work of breathing. Neurologic: Grossly intact, no focal deficits, moving all 4 extremities. Psychiatric: Normal mood and affect.  Laboratory Data: Lab Results  Component Value Date   WBC 3.4 (L) 06/10/2019   HGB 12.2 06/10/2019   HCT 36.8 06/10/2019   MCV 91.1 06/10/2019   PLT 143 (L) 06/10/2019    Lab Results  Component Value Date   CREATININE 0.71 06/10/2019    Lab Results  Component Value Date   HGBA1C 6.1 (H) 12/03/2016    Lab Results  Component Value Date   TSH 8.615 (H) 12/03/2016    Lab Results  Component Value Date   AST 58 (H) 06/10/2019   Lab Results  Component Value Date   ALT 33 06/10/2019    Urinalysis    Component Value Date/Time   COLORURINE STRAW (A) 02/22/2019 1422   APPEARANCEUR Clear 07/28/2019 1346   LABSPEC <1.005 (L) 02/22/2019 1422   PHURINE 6.5 02/22/2019 1422   GLUCOSEU Negative 07/28/2019 1346   HGBUR NEGATIVE 02/22/2019 1422   BILIRUBINUR Negative 07/28/2019 1346   KETONESUR NEGATIVE 02/22/2019 1422    PROTEINUR Negative 07/28/2019 1346   PROTEINUR NEGATIVE 02/22/2019 1422   NITRITE Negative 07/28/2019 1346   NITRITE NEGATIVE 02/22/2019 1422   LEUKOCYTESUR Negative 07/28/2019 1346   LEUKOCYTESUR NEGATIVE 02/22/2019 1422    I have reviewed the labs.   Pertinent Imaging: Results for CLAIRESSA, BOULET (MRN [id number]) as of 09/01/2019 16:50  Ref. Range 09/01/2019 13:11  Scan Result Unknown 75    Assessment & Plan:    1. OAB (overactive bladder) -Bladder Scan (Post Void Residual) in office -Patient at goal with Myrbetriq, prescription sent to her pharmacy -Return to clinic in 3 months for OAB questionnaire and PVR  2. Constipation -Given refill  on Senokot   Return in about 3 months (around 12/02/2019) for PVR and OAB questionnaire.  These notes generated with voice recognition software. I apologize for typographical errors.  Zara Council, PA-C  [facility] [address]  [place], [place] [address] [phone]  I spent 20 minutes on the day of the encounter to include pre-visit record review, face-to-face time with the patient, and post-visit ordering of tests.

## 2019-10-19 ENCOUNTER — Ambulatory Visit: Payer: Medicaid Other

## 2019-10-20 ENCOUNTER — Other Ambulatory Visit: Payer: Self-pay | Admitting: Oncology

## 2019-11-02 ENCOUNTER — Ambulatory Visit: Payer: Medicaid Other | Admitting: Surgery

## 2019-11-14 ENCOUNTER — Ambulatory Visit: Payer: Medicaid Other | Admitting: Surgery

## 2019-11-26 NOTE — Progress Notes (Signed)
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 High risk MammaPrint.  INTERVAL HISTORY: Patient returns to clinic today for routine 18-monthevaluation.  She was seen in follow-up by surgery yesterday complaining of abdominal pain, but she states this has improved.  She currently feels well and is asymptomatic.  She continues to tolerate letrozole without significant side effects. She has no neurologic complaints.  She has a good appetite and denies weight loss.  She denies any chest pain, shortness of breath, cough, or hemoptysis.  She denies any nausea, vomiting, constipation, or diarrhea. She has no urinary complaints.  Patient offers no further specific complaints today.  REVIEW OF SYSTEMS:   Review of Systems  Constitutional: Negative.  Negative for fever, malaise/fatigue and weight loss.  Respiratory: Negative.  Negative for cough and shortness of breath.   Cardiovascular: Negative.  Negative for chest pain and leg swelling.  Gastrointestinal: Positive for abdominal pain. Negative for nausea and vomiting.  Genitourinary: Negative.  Negative for dysuria.  Musculoskeletal: Negative.  Negative for back pain and joint pain.  Skin: Negative.  Negative for rash.  Neurological: Negative.  Negative for dizziness, focal weakness, weakness and headaches.  Psychiatric/Behavioral: Negative.  The patient is not nervous/anxious.     As per HPI. Otherwise, a complete review of systems is negative.  PAST MEDICAL HISTORY: Past Medical History:  Diagnosis Date  . Anxiety    . [place]   right breast lumpectomy with chemo and mammosite  . Breast cancer of upper-outer quadrant of right female breast [place] 08/[place]   1.0 cm ER+, PR-, Her 2 neu not overexpressed. Node negative. T1b, N0.  .[place]ervical cancer [place]  . Hypertension   . Hypothyroidism   . Personal history of chemotherapy 11/2016-12/2016   right breast ca  . Personal history of radiation therapy 09/15/2016   mammosite  . PONV (postoperative nausea and vomiting)   . Thyroid disease     PAST SURGICAL HISTORY: Past Surgical History:  Procedure Laterality Date  . ABDOMINAL HYSTERECTOMY    . BREAST BIOPSY Right 08/08/2016   invasive mammary carcinoma  . BREAST BIOPSY Right 06/01/2019   bx path pending and abbcess drainage  . BREAST LUMPECTOMY Right 08/28/2016   invasive mammary carcinoma, clear margins, negative lymph nodes  . BREAST LUMPECTOMY WITH SENTINEL LYMPH NODE BIOPSY Right 08/28/2016   Procedure: BREAST LUMPECTOMY WITH SENTINEL LYMPH NODE BX;  Surgeon: BRobert Bellow MD;  Location: [facility];  Service: General;  Laterality: Right;  . BREAST MAMMOSITE Right 09/15/2016   Procedure: MAMMOSITE BREAST;  Surgeon: BRobert Bellow MD;  Location: [facility];  Service: General;  Laterality: Right;  . BREAST SURGERY Right    Breast Biopsy  . CERVIX SURGERY    . COLONOSCOPY  2008  . EYE SURGERY Bilateral    Cataract Extraction with IOL  . PORTA CATH INSERTION Right 11/20/2016   Procedure: PORTA CATH INSERTION;  Surgeon: BRobert Bellow MD;  Location: [facility];  Service: General;  Laterality: Right;  . PORTA CATH REMOVAL  2018  . RE-EXCISION OF BREAST LUMPECTOMY Right 07/14/2019  Procedure: RE-EXCISION OF Right BREAST LUMPECTOMY;  Surgeon: Jules Husbands, MD;  Location: [facility]S;  Service: General;  Laterality: Right;  . SHOULDER ARTHROSCOPY Right 2012   torn ligaments    FAMILY HISTORY: Family History  Problem Relation Age of Onset  . Breast cancer Neg Hx      ADVANCED DIRECTIVES (Y/N):  N  HEALTH MAINTENANCE: Social History   Tobacco Use  . Smoking status: Never Smoker  . Smokeless tobacco: Never Used  Vaping Use  . Vaping Use: Never used  Substance Use Topics  . Alcohol use: No  . Drug use: No     Colonoscopy:  PAP:  Bone density:  Lipid panel:  No Known Allergies  Current Outpatient Medications  Medication Sig Dispense Refill  . letrozole (FEMARA) 2.5 MG tablet TAKE 1 TABLET BY MOUTH ONCE DAILY. 90 tablet 3  . levothyroxine (SYNTHROID, LEVOTHROID) 100 MCG tablet Take 100 mcg by mouth daily before breakfast.    . lisinopril (PRINIVIL,ZESTRIL) 10 MG tablet Take 10 mg by mouth daily.    . mirabegron ER (MYRBETRIQ) 25 MG TB24 tablet Take 1 tablet (25 mg total) by mouth daily. 30 tablet 12  . MULTIPLE VITAMIN PO Take 1 tablet by mouth daily.    . potassium chloride SA (KLOR-CON) 20 MEQ tablet Take 20 mEq by mouth 2 (two) times daily.    [name] senna-docusate (SENOKOT-S) 8.6-50 MG tablet Take 1 tablet by mouth 2 (two) times daily. 30 tablet 0   No current facility-administered medications for this visit.    OBJECTIVE: Vitals:   11/29/19 1114  BP: (!) 117/56  Pulse: 80  Resp: 16  Temp: 98.3 F (36.8 C)  SpO2: 98%     Body mass index is 29.47 kg/m.    ECOG FS:0 - Asymptomatic  General: Well-developed, well-nourished, no acute distress. Eyes: Pink conjunctiva, anicteric sclera. HEENT: Normocephalic, moist mucous membranes. Breast: Exam deferred today. Lungs: No audible wheezing or coughing. Heart: Regular rate and rhythm. Abdomen: Soft, nontender, no obvious distention. Musculoskeletal: No edema, cyanosis, or clubbing. Neuro: Alert, answering all questions appropriately. Cranial nerves grossly intact. Skin: No rashes or petechiae noted. Psych: Normal affect.    LAB RESULTS:  Lab Results  Component Value Date   NA 138 06/10/2019   K 4.5 06/10/2019   CL 104 06/10/2019   CO2 27 06/10/2019   GLUCOSE 160 (H)  06/10/2019   BUN 13 06/10/2019   CREATININE 0.71 06/10/2019   CALCIUM 9.2 06/10/2019   PROT 7.0 06/10/2019   ALBUMIN 3.8 06/10/2019   AST 58 (H) 06/10/2019   ALT 33 06/10/2019   ALKPHOS 116 06/10/2019   BILITOT 0.4 06/10/2019   GFRNONAA >60 06/10/2019   GFRAA >60 06/10/2019    Lab Results  Component Value Date   WBC 3.4 (L) 06/10/2019   NEUTROABS 1.9 06/10/2019   HGB 12.2 06/10/2019   HCT 36.8 06/10/2019   MCV 91.1 06/10/2019   PLT 143 (L) 06/10/2019     STUDIES: No results found.  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 High risk MammaPrint.  PLAN:    1. 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 Given her high risk MammaPrint, she also received adjuvant chemotherapy with Taxotere and Cytoxan completing cycle 4 of 4 on December 24, 2016.  Patient  then abruptly transferred care to [facility] and was initiated on aromatase inhibitor.  Anastrozole was discontinued secondary to side effects.  She is now on letrozole and will complete 5 years of treatment in 2023.  Given her high risk MammaPrint may consider extending treatment.  Her most recent mammogram on May 06, 2019 was reported as BI-RADS 2.  Repeat in February 2022.  Return to clinic in 6 months for routine evaluation. 2.  Breast mass: Patient underwent recent lumpectomy with surgery and pathology was consistent with fat necrosis.  No active malignancy was noted. 3.  Bone health: Patient's most recent bone mineral density on June 09, 2019 reported T score of -0.7 which is considered normal.  Repeat in March 2023. 4.  Joint pain: Patient does not complain of this today.  Continue symptomatic treatment with Tylenol. 5.  Abdominal pain: Patient has ultrasound  and laboratory work ordered by surgery scheduled later this week if her Covid test returns negative.  Interpreter was present throughout the entire telemedicine visit.  Patient expressed understanding and was in agreement with this plan. She also understands that She can call clinic at any time with any questions, concerns, or complaints.   Cancer Staging Malignant neoplasm of upper-outer quadrant of right breast in female, estrogen receptor positive ([place]) Staging form: Breast, AJCC 8th Edition - Pathologic stage from 10/01/2016: Stage IA (pT1b, pN0, cM0, G2, ER: Positive, PR: Negative, HER2: Negative) - Signed by Lloyd Huger, MD on 10/01/2016   Lloyd Huger, MD   11/29/2019 11:36 AM

## 2019-11-28 ENCOUNTER — Ambulatory Visit (INDEPENDENT_AMBULATORY_CARE_PROVIDER_SITE_OTHER): Payer: Medicaid Other | Admitting: Surgery

## 2019-11-28 ENCOUNTER — Telehealth: Payer: Self-pay | Admitting: Emergency Medicine

## 2019-11-28 ENCOUNTER — Encounter: Payer: Self-pay | Admitting: Surgery

## 2019-11-28 ENCOUNTER — Other Ambulatory Visit: Payer: Self-pay

## 2019-11-28 ENCOUNTER — Encounter: Payer: Self-pay | Admitting: Emergency Medicine

## 2019-11-28 VITALS — BP 142/82 | HR 88 | Temp 98.4°F | Resp 12 | Wt 152.0 lb

## 2019-11-28 DIAGNOSIS — R109 Unspecified abdominal pain: Secondary | ICD-10-CM

## 2019-11-28 DIAGNOSIS — R1011 Right upper quadrant pain: Secondary | ICD-10-CM

## 2019-11-28 NOTE — Progress Notes (Signed)
Outpatient Surgical Follow Up  11/28/2019  Wanda Reed is an 64 y.o. female.   Chief Complaint  Patient presents with  . Follow-up    s/p right breast lumpectomy    HPI: Wanda Reed is a 64 year old female well-known to me with a history of right breast cancer status post lumpectomy and MammoSite radiation in 2018.  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  She recovered very well from this and was very happy with the results after the operation as her preoperatively symptoms resolved completely. Last week she reports starting having some abdominal discomfort with sharp abdominal pain quadrant that apparently was aggravated after she ate Tacos ( flour and carnitas) she developed some low-grade temperature no respiratory symptoms.  She also had a few loose bowel movements.  She feels better.  She specifically denies any close contacts with Covid patients of family members.  She has been vaccinated.  Past Medical History:  Diagnosis Date  . Anxiety   . Breast cancer ([place]) 07/2016   right breast lumpectomy with chemo and mammosite  . Breast cancer of upper-outer quadrant of right female breast ([place]) 08/2016   1.0 cm ER+, PR-, Her 2 neu not overexpressed. Node negative. T1b, N0.  [name] Cervical cancer ([place]) 1990  . Hypertension   . Hypothyroidism   . Personal history of chemotherapy 11/2016-12/2016   right breast ca  . Personal history of radiation therapy 09/15/2016   mammosite  . PONV (postoperative nausea and vomiting)   . Thyroid disease     Past Surgical History:  Procedure Laterality Date  . ABDOMINAL HYSTERECTOMY    . BREAST BIOPSY Right 08/08/2016   invasive mammary carcinoma  . BREAST BIOPSY Right 06/01/2019   bx path pending and abbcess drainage  . BREAST LUMPECTOMY Right 08/28/2016   invasive mammary carcinoma, clear margins, negative lymph nodes  . BREAST LUMPECTOMY WITH SENTINEL LYMPH NODE BIOPSY Right 08/28/2016    Procedure: BREAST LUMPECTOMY WITH SENTINEL LYMPH NODE BX;  Surgeon: [name], MD;  Location: [facility];  Service: General;  Laterality: Right;  . BREAST MAMMOSITE Right 09/15/2016   Procedure: MAMMOSITE BREAST;  Surgeon: [name], MD;  Location: [facility];  Service: General;  Laterality: Right;  . BREAST SURGERY Right    Breast Biopsy  . CERVIX SURGERY    . COLONOSCOPY  2008  . EYE SURGERY Bilateral    Cataract Extraction with IOL  . PORTA CATH INSERTION Right 11/20/2016   Procedure: PORTA CATH INSERTION;  Surgeon: [name], MD;  Location: [facility];  Service: General;  Laterality: Right;  . PORTA CATH REMOVAL  2018  . RE-EXCISION OF BREAST LUMPECTOMY Right 07/14/2019   Procedure: RE-EXCISION OF Right BREAST LUMPECTOMY;  Surgeon: Jules Husbands, MD;  Location: [facility];  Service: General;  Laterality: Right;  . SHOULDER ARTHROSCOPY Right 2012   torn ligaments    Family History  Problem Relation Age of Onset  . Breast cancer Neg Hx     Social History:  reports that she has never smoked. She has never used smokeless tobacco. She reports that she does not drink alcohol and does not use drugs.  Allergies: No Known Allergies  Medications reviewed.    ROS Full ROS performed and is otherwise negative other than what is stated in HPI   BP (!) 142/82   Pulse 88   Temp 98.4 F (36.9 C)   Resp 12   Wt 152 lb (68.9  kg)   SpO2 97%   BMI 29.69 kg/m   Physical Exam Vitals and nursing note reviewed. Exam conducted with a chaperone present.  Constitutional:      Appearance: Normal appearance.  Eyes:     General: No scleral icterus.       Right eye: No discharge.        Left eye: No discharge.  Cardiovascular:     Rate and Rhythm: Normal rate and regular rhythm.  Pulmonary:     Effort: Pulmonary effort is normal. No respiratory distress.     Breath sounds: Normal breath sounds. No stridor.     Comments: Breast without right lumpectomy scar healing well.   Significant improvement as compared to preoperatively findings. Abdominal:     General: Abdomen is flat. There is no distension.     Palpations: There is no mass.     Tenderness: There is no abdominal tenderness. There is no guarding or rebound.     Hernia: No hernia is present.  Musculoskeletal:        General: Normal range of motion.     Cervical back: Normal range of motion and neck supple. No rigidity or tenderness.  Lymphadenopathy:     Cervical: No cervical adenopathy.  Skin:    General: Skin is warm and dry.     Capillary Refill: Capillary refill takes less than 2 seconds.  Neurological:     General: No focal deficit present.     Mental Status: She is alert and oriented to person, place, and time.  Psychiatric:        Mood and Affect: Mood normal.        Behavior: Behavior normal.        Thought Content: Thought content normal.        Judgment: Judgment normal.     Assessment/Plan: Abdominal pain low-grade temperature and some diarrhea.'s findings are consistent with cholecystitis versus other gastroenteritis.  I cannot rule out Covid.  We will start work-up to include a CBC, CMP, right upper quadrant ultrasound but first we will have to make sure that she does not have any Covid.  Discussed with the patient in detail about her disease process.  Appropriate measures were taken. We may need to do more extensive work-up if ultrasound does not reveal any evidence of biliary issues.  Greater than 50% of the 40 minutes  visit was spent in counseling/coordination of care   [name], MD [place] Surgeon

## 2019-11-28 NOTE — Addendum Note (Signed)
Addended by: Harvin Hazel on: 11/28/2019 04:39 PM   Modules accepted: Orders

## 2019-11-28 NOTE — Telephone Encounter (Signed)
Daughter called stating [facility] does not do Covid testing. Daughter advised to get mother set up with an appt to get tested at any location and to bring results in once obtained.  Advised daughter after obtaining results we will proceed with scheduling Ultrasound if negative. Daughter voiced understanding.

## 2019-11-28 NOTE — Patient Instructions (Addendum)
You will need to get a Covid test done at a [facility] location. Its inside of the [facility]. Address: [address], [place], [place] [address]  When we have your results, we will schedule you an Ultrasound and lab work. We will also schedule a follow up appointment to see Dr Dahlia Byes.   Call the office if you have any questions or concerns.   Deber realizar Ardelia Mems prueba de Covid en una ubicacin [facility]. Direccin: [place], [place] [address]  Cuando tengamos sus resultados, le programaremos una ecografa y un anlisis de laboratorio. Tambin programaremos una cita de seguimiento para ver al Dr. Dahlia Byes.  Llame a la oficina si tiene alguna pregunta o inquietud.

## 2019-11-29 ENCOUNTER — Encounter: Payer: Self-pay | Admitting: Oncology

## 2019-11-29 ENCOUNTER — Inpatient Hospital Stay: Payer: Medicaid Other | Attending: Oncology | Admitting: Oncology

## 2019-11-29 VITALS — BP 117/56 | HR 80 | Temp 98.3°F | Resp 16 | Wt 150.9 lb

## 2019-11-29 DIAGNOSIS — I10 Essential (primary) hypertension: Secondary | ICD-10-CM | POA: Diagnosis not present

## 2019-11-29 DIAGNOSIS — Z90710 Acquired absence of both cervix and uterus: Secondary | ICD-10-CM | POA: Diagnosis not present

## 2019-11-29 DIAGNOSIS — R109 Unspecified abdominal pain: Secondary | ICD-10-CM | POA: Diagnosis not present

## 2019-11-29 DIAGNOSIS — Z923 Personal history of irradiation: Secondary | ICD-10-CM | POA: Diagnosis not present

## 2019-11-29 DIAGNOSIS — C50411 Malignant neoplasm of upper-outer quadrant of right female breast: Secondary | ICD-10-CM | POA: Diagnosis present

## 2019-11-29 DIAGNOSIS — Z8541 Personal history of malignant neoplasm of cervix uteri: Secondary | ICD-10-CM | POA: Diagnosis not present

## 2019-11-29 DIAGNOSIS — E039 Hypothyroidism, unspecified: Secondary | ICD-10-CM | POA: Diagnosis not present

## 2019-11-29 DIAGNOSIS — Z79811 Long term (current) use of aromatase inhibitors: Secondary | ICD-10-CM | POA: Diagnosis not present

## 2019-11-29 DIAGNOSIS — Z170 Estrogen receptor positive status [ER+]: Secondary | ICD-10-CM | POA: Diagnosis not present

## 2019-11-29 DIAGNOSIS — Z9221 Personal history of antineoplastic chemotherapy: Secondary | ICD-10-CM | POA: Insufficient documentation

## 2019-11-29 DIAGNOSIS — Z79899 Other long term (current) drug therapy: Secondary | ICD-10-CM | POA: Insufficient documentation

## 2019-11-29 NOTE — Progress Notes (Signed)
Patient denies any pain. States she has been having some stomach issues but is seeing gastrologist concerning this issues.

## 2019-12-01 ENCOUNTER — Telehealth: Payer: Self-pay | Admitting: Emergency Medicine

## 2019-12-01 ENCOUNTER — Encounter: Payer: Self-pay | Admitting: Surgery

## 2019-12-01 NOTE — Telephone Encounter (Signed)
Patient's COVID test came back Negative. [name] to scan in patients chart).   U/S scheduled for 12/08/19 at [facility] at the [facility]. Arrival time 845am. NPO after midnight.  Pt to have labs done at the lab inside the [facility]. Orders in; no appt required.   F/u Pabon: 12/19/19 at 1015am.   Called patients daughter Lenna Sciara. She was advised of appts. Daughter voiced understanding.

## 2019-12-05 NOTE — Progress Notes (Deleted)
12/06/2019 10:05 PM   Wanda Reed 12-17-1955 [id number]  Referring provider: Denton Lank, MD [address] [place],  [place] [address]  No chief complaint on file.   HPI: Wanda Reed is a 64 y.o. female with dysuria, OAB and pelvic pain who presents today for a follow up after a trial of Myrbetriq with interpreter, Alejandra, through language line.  She has been followed by Dr. Diamantina Providence for her urinary issues.  She last saw him on 07/28/2019 and was placed on Myrbetriq 50 mg daily for her symptoms.    The patient is  experiencing urgency x 4-7, frequency x 4-7, is restricting fluids to avoid visits to the restroom, is engaging in toilet mapping, incontinence x 0-3 and nocturia x 0-3.   Her BP is 146/78.   Her PVR is 75 mL   She has suprapubic pressure, but this has improved since taking the Myrbetriq.     Patient denies any modifying or aggravating factors.  Patient denies any gross hematuria, dysuria or suprapubic/flank pain.  Patient denies any fevers, chills, nausea or vomiting.   Her right sided pain has abated and she has not had an UTI since her last visit with Wanda Reed.      PMH: Past Medical History:  Diagnosis Date  . Anxiety   . Breast cancer ([place]) 07/2016   right breast lumpectomy with chemo and mammosite  . Breast cancer of upper-outer quadrant of right female breast ([place]) 08/2016   1.0 cm ER+, PR-, Her 2 neu not overexpressed. Node negative. T1b, N0.  [name] Cervical cancer ([place]) 1990  . Hypertension   . Hypothyroidism   . Personal history of chemotherapy 11/2016-12/2016   right breast ca  . Personal history of radiation therapy 09/15/2016   mammosite  . PONV (postoperative nausea and vomiting)   . Thyroid disease     Surgical History: Past Surgical History:  Procedure Laterality Date  . ABDOMINAL HYSTERECTOMY    . BREAST BIOPSY Right 08/08/2016   invasive mammary carcinoma  . BREAST BIOPSY Right 06/01/2019   bx path pending and abbcess drainage   . BREAST LUMPECTOMY Right 08/28/2016   invasive mammary carcinoma, clear margins, negative lymph nodes  . BREAST LUMPECTOMY WITH SENTINEL LYMPH NODE BIOPSY Right 08/28/2016   Procedure: BREAST LUMPECTOMY WITH SENTINEL LYMPH NODE BX;  Surgeon: [name], MD;  Location: [facility];  Service: General;  Laterality: Right;  . BREAST MAMMOSITE Right 09/15/2016   Procedure: MAMMOSITE BREAST;  Surgeon: [name], MD;  Location: [facility];  Service: General;  Laterality: Right;  . BREAST SURGERY Right    Breast Biopsy  . CERVIX SURGERY    . COLONOSCOPY  2008  . EYE SURGERY Bilateral    Cataract Extraction with IOL  . PORTA CATH INSERTION Right 11/20/2016   Procedure: PORTA CATH INSERTION;  Surgeon: [name], MD;  Location: [facility];  Service: General;  Laterality: Right;  . PORTA CATH REMOVAL  2018  . RE-EXCISION OF BREAST LUMPECTOMY Right 07/14/2019   Procedure: RE-EXCISION OF Right BREAST LUMPECTOMY;  Surgeon: Jules Husbands, MD;  Location: [facility];  Service: General;  Laterality: Right;  . SHOULDER ARTHROSCOPY Right 2012   torn ligaments    Home Medications:  Allergies as of 12/06/2019   No Known Allergies     Medication List       Accurate as of December 05, 2019 10:05 PM. If you have any questions, ask your nurse or doctor.  letrozole 2.5 MG tablet Commonly known as: FEMARA TAKE 1 TABLET BY MOUTH ONCE DAILY.   levothyroxine 100 MCG tablet Commonly known as: SYNTHROID Take 100 mcg by mouth daily before breakfast.   lisinopril 10 MG tablet Commonly known as: ZESTRIL Take 10 mg by mouth daily.   mirabegron ER 25 MG Tb24 tablet Commonly known as: MYRBETRIQ Take 1 tablet (25 mg total) by mouth daily.   MULTIPLE VITAMIN PO Take 1 tablet by mouth daily.   potassium chloride SA 20 MEQ tablet Commonly known as: KLOR-CON Take 20 mEq by mouth 2 (two) times daily.   senna-docusate 8.6-50 MG tablet Commonly known as: Senokot-S Take 1 tablet by  mouth 2 (two) times daily.       Allergies: No Known Allergies  Family History: Family History  Problem Relation Age of Onset  . Breast cancer Neg Hx     Social History:  reports that she has never smoked. She has never used smokeless tobacco. She reports that she does not drink alcohol and does not use drugs.  ROS: Pertinent ROS in HPI  Physical Exam: There were no vitals taken for this visit.  Constitutional:  Well nourished. Alert and oriented, No acute distress. HEENT: [place] AT, moist mucus membranes.  Trachea midline, no masses. Cardiovascular: No clubbing, cyanosis, or edema. Respiratory: Normal respiratory effort, no increased work of breathing. GI: Abdomen is soft, non tender, non distended, no abdominal masses. Liver and spleen not palpable.  No hernias appreciated.  Stool sample for occult testing is not indicated.   GU: No CVA tenderness.  No bladder fullness or masses.  *** external genitalia, *** pubic hair distribution, no lesions.  Normal urethral meatus, no lesions, no prolapse, no discharge.   No urethral masses, tenderness and/or tenderness. No bladder fullness, tenderness or masses. *** vagina mucosa, *** estrogen effect, no discharge, no lesions, *** pelvic support, *** cystocele and *** rectocele noted.  No cervical motion tenderness.  Uterus is freely mobile and non-fixed.  No adnexal/parametria masses or tenderness noted.  Anus and perineum are without rashes or lesions.   ***  Skin: No rashes, bruises or suspicious lesions. Lymph: No cervical or inguinal adenopathy. Neurologic: Grossly intact, no focal deficits, moving all 4 extremities. Psychiatric: Normal mood and affect.   Laboratory Data: Lab Results  Component Value Date   WBC 3.4 (L) 06/10/2019   HGB 12.2 06/10/2019   HCT 36.8 06/10/2019   MCV 91.1 06/10/2019   PLT 143 (L) 06/10/2019    Lab Results  Component Value Date   CREATININE 0.71 06/10/2019    Lab Results  Component Value Date    HGBA1C 6.1 (H) 12/03/2016    Lab Results  Component Value Date   TSH 8.615 (H) 12/03/2016    Lab Results  Component Value Date   AST 58 (H) 06/10/2019   Lab Results  Component Value Date   ALT 33 06/10/2019    Urinalysis    Component Value Date/Time   COLORURINE STRAW (A) 02/22/2019 1422   APPEARANCEUR Clear 07/28/2019 1346   LABSPEC <1.005 (L) 02/22/2019 1422   PHURINE 6.5 02/22/2019 1422   GLUCOSEU Negative 07/28/2019 1346   HGBUR NEGATIVE 02/22/2019 1422   BILIRUBINUR Negative 07/28/2019 1346   KETONESUR NEGATIVE 02/22/2019 1422   PROTEINUR Negative 07/28/2019 1346   PROTEINUR NEGATIVE 02/22/2019 1422   NITRITE Negative 07/28/2019 1346   NITRITE NEGATIVE 02/22/2019 1422   LEUKOCYTESUR Negative 07/28/2019 1346   LEUKOCYTESUR NEGATIVE 02/22/2019 1422    I have  reviewed the labs.   Pertinent Imaging: ***   Assessment & Plan:    1. OAB (overactive bladder) -Bladder Scan (Post Void Residual) in office -Patient at goal with Myrbetriq, prescription sent to her pharmacy -Return to clinic in 3 months for OAB questionnaire and PVR  2. Constipation -Given refill on Senokot   No follow-ups on file.  These notes generated with voice recognition software. I apologize for typographical errors.  Zara Council, PA-C  [facility] [address]  [place], [place] [address] [phone]

## 2019-12-06 ENCOUNTER — Ambulatory Visit: Payer: Medicaid Other | Admitting: Urology

## 2019-12-06 ENCOUNTER — Encounter: Payer: Self-pay | Admitting: Urology

## 2019-12-08 ENCOUNTER — Other Ambulatory Visit
Admission: RE | Admit: 2019-12-08 | Discharge: 2019-12-08 | Disposition: A | Discharge status: Home / Self Care | Payer: Medicaid Other | Source: Home / Self Care | Attending: Surgery | Admitting: Surgery

## 2019-12-08 ENCOUNTER — Ambulatory Visit
Admission: RE | Admit: 2019-12-08 | Discharge: 2019-12-08 | Disposition: A | Discharge status: Home / Self Care | Payer: Medicaid Other | Source: Ambulatory Visit | Attending: Surgery | Admitting: Surgery

## 2019-12-08 ENCOUNTER — Other Ambulatory Visit: Payer: Self-pay

## 2019-12-08 DIAGNOSIS — R1011 Right upper quadrant pain: Secondary | ICD-10-CM | POA: Diagnosis not present

## 2019-12-08 LAB — CBC WITH DIFFERENTIAL/PLATELET
Abs Immature Granulocytes: 0 10*3/uL (ref 0.00–0.07)
Basophils Absolute: 0 10*3/uL (ref 0.0–0.1)
Basophils Relative: 1 %
Eosinophils Absolute: 0.1 10*3/uL (ref 0.0–0.5)
Eosinophils Relative: 3 %
HCT: 37 % (ref 36.0–46.0)
Hemoglobin: 12.5 g/dL (ref 12.0–15.0)
Immature Granulocytes: 0 %
Lymphocytes Relative: 31 %
Lymphs Abs: 0.9 10*3/uL (ref 0.7–4.0)
MCH: 30.4 pg (ref 26.0–34.0)
MCHC: 33.8 g/dL (ref 30.0–36.0)
MCV: 90 fL (ref 80.0–100.0)
Monocytes Absolute: 0.2 10*3/uL (ref 0.1–1.0)
Monocytes Relative: 8 %
Neutro Abs: 1.8 10*3/uL (ref 1.7–7.7)
Neutrophils Relative %: 57 %
Platelets: 150 10*3/uL (ref 150–400)
RBC: 4.11 MIL/uL (ref 3.87–5.11)
RDW: 13.1 % (ref 11.5–15.5)
WBC: 3 10*3/uL — ABNORMAL LOW (ref 4.0–10.5)
nRBC: 0 % (ref 0.0–0.2)

## 2019-12-08 LAB — COMPREHENSIVE METABOLIC PANEL
ALT: 16 U/L (ref 0–44)
AST: 27 U/L (ref 15–41)
Albumin: 3.7 g/dL (ref 3.5–5.0)
Alkaline Phosphatase: 100 U/L (ref 38–126)
Anion gap: 8 (ref 5–15)
BUN: 14 mg/dL (ref 8–23)
CO2: 30 mmol/L (ref 22–32)
Calcium: 9.1 mg/dL (ref 8.9–10.3)
Chloride: 100 mmol/L (ref 98–111)
Creatinine, Ser: 0.75 mg/dL (ref 0.44–1.00)
GFR calc Af Amer: >60 mL/min (ref >60)
GFR calc non Af Amer: >60 mL/min (ref >60)
Glucose, Bld: 105 mg/dL — ABNORMAL HIGH (ref 70–99)
Potassium: 4.3 mmol/L (ref 3.5–5.1)
Sodium: 138 mmol/L (ref 135–145)
Total Bilirubin: 0.6 mg/dL (ref 0.3–1.2)
Total Protein: 7.3 g/dL (ref 6.5–8.1)

## 2019-12-09 ENCOUNTER — Telehealth: Payer: Self-pay

## 2019-12-09 NOTE — Telephone Encounter (Signed)
Spoke with patients daughter-notified of normal ultrasound results per Dr.Pabon. Follow up appointment 12/19/19.

## 2019-12-19 ENCOUNTER — Ambulatory Visit (INDEPENDENT_AMBULATORY_CARE_PROVIDER_SITE_OTHER): Payer: Medicaid Other | Admitting: Surgery

## 2019-12-19 ENCOUNTER — Telehealth: Payer: Self-pay | Admitting: Surgery

## 2019-12-19 ENCOUNTER — Other Ambulatory Visit: Payer: Self-pay

## 2019-12-19 ENCOUNTER — Encounter: Payer: Self-pay | Admitting: Surgery

## 2019-12-19 VITALS — BP 128/79 | HR 68 | Temp 98.5°F | Ht 60.0 in | Wt 149.8 lb

## 2019-12-19 DIAGNOSIS — R109 Unspecified abdominal pain: Secondary | ICD-10-CM

## 2019-12-19 DIAGNOSIS — R1011 Right upper quadrant pain: Secondary | ICD-10-CM | POA: Diagnosis not present

## 2019-12-19 NOTE — Telephone Encounter (Signed)
Spoke with patient's daughter Wanda Reed and notified her that once Gastroenterology received her referral and see what providers are available for colonoscopy, their office would contact the patient to get scheduled. Per Dr.Pabon recommend patient to have a HIDA scan while waiting to get scheduled with GI. Patient is scheduled for HIDA scan on 12/29/19 at 10 am with arrival time at 9:30 am. Patient was instructed not to have anything to eat or drink after midnight or no pain medication(s). Patient's daughter verbalized understanding and has no further questions.

## 2019-12-19 NOTE — Telephone Encounter (Signed)
Patients daughter called and said her mom was being referred to gi and said they wouldn't be able to get her into the office until december and is asking if there is a way she could be seen sooner or if there was another GI doctor. Please call patient and advise.

## 2019-12-19 NOTE — Patient Instructions (Signed)
Dolor abdominal en los adultos Abdominal Pain, Adult El dolor en el abdomen (dolor abdominal) puede tener muchas causas. A menudo, el dolor abdominal no es grave y mejora sin tratamiento o con tratamiento en la casa. Sin embargo, a veces el dolor abdominal es grave. El mdico le har preguntas sobre sus antecedentes mdicos y le har un examen fsico para tratar de determinar la causa del dolor abdominal. Siga estas instrucciones en su casa:  Medicamentos  Use los medicamentos de venta libre y los recetados solamente como se lo haya indicado el mdico.  No tome un laxante a menos que se lo haya indicado el mdico. Instrucciones generales  Controle su afeccin para detectar cualquier cambio.  Beba suficiente lquido como para mantener la orina de color amarillo plido.  Concurra a todas las visitas de seguimiento como se lo haya indicado el mdico. Esto es importante. Comunquese con un mdico si:  El dolor abdominal cambia o empeora.  No tiene apetito o baja de peso sin proponrselo.  Est estreido o tiene diarrea durante ms de 2 o 3das.  Tiene dolor cuando orina o defeca.  El dolor abdominal lo despierta de noche.  El dolor empeora con las comidas, despus de comer o con determinados alimentos.  Tiene vmitos y no puede retener nada de lo que ingiere.  Tiene fiebre.  Observa sangre en la orina. Solicite ayuda inmediatamente si:  El dolor no desaparece tan pronto como el mdico le dijo que era esperable.  No puede dejar de vomitar.  El dolor se siente solo en zonas del abdomen, como el lado derecho o la parte inferior izquierda del abdomen. Si se localiza en la zona derecha, posiblemente podra tratarse de apendicitis.  Las heces son sanguinolentas o de color negro, o de aspecto alquitranado.  Tiene dolor intenso, clicos o distensin abdominal.  Tiene signos de deshidratacin, como los siguientes: ? Orina de color oscuro, muy escasa o falta de orina. ? Labios  agrietados. ? Sequedad de boca. ? Ojos hundidos. ? Somnolencia. ? Debilidad.  Tiene dificultad para respirar o dolor en el pecho. Resumen  A menudo, el dolor abdominal no es grave y mejora sin tratamiento o con tratamiento en la casa. Sin embargo, a veces el dolor abdominal es grave.  Controle su afeccin para detectar cualquier cambio.  Use los medicamentos de venta libre y los recetados solamente como se lo haya indicado el mdico.  Comunquese con un mdico si el dolor abdominal cambia o empeora.  Busque ayuda de inmediato si tiene dolor intenso, clicos o distensin abdominal. Esta informacin no tiene como fin reemplazar el consejo del mdico. Asegrese de hacerle al mdico cualquier pregunta que tenga. Document Revised: 09/08/2018 Document Reviewed: 09/08/2018 Elsevier Patient Education  2020 Elsevier Inc.  

## 2019-12-20 ENCOUNTER — Other Ambulatory Visit: Payer: Self-pay

## 2019-12-20 ENCOUNTER — Ambulatory Visit
Admission: RE | Admit: 2019-12-20 | Discharge: 2019-12-20 | Disposition: A | Discharge status: Home / Self Care | Payer: Medicaid Other | Source: Ambulatory Visit | Attending: Oncology | Admitting: Oncology

## 2019-12-20 DIAGNOSIS — C50411 Malignant neoplasm of upper-outer quadrant of right female breast: Secondary | ICD-10-CM | POA: Diagnosis present

## 2019-12-20 DIAGNOSIS — Z170 Estrogen receptor positive status [ER+]: Secondary | ICD-10-CM | POA: Insufficient documentation

## 2019-12-21 NOTE — Progress Notes (Signed)
Outpatient Surgical Follow Up  12/21/2019  Wanda Reed is an 64 y.o. female.   Chief Complaint  Patient presents with  . Follow-up     Follow Up: RUQ pain- U/S 12/08/19 & Labs    HPI: Wanda Reed is a 64 year old female well-known to me with a history of right breast cancer status post lumpectomy and MammoSite radiation in 2018.  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  She recovered very well from this and was very happy with the results after the operation as her preoperatively symptoms resolved completely.  She has intermittent subacute right upper quadrant pain.  She was febrile the last time I saw her and she was tested for Covid that was negative.  She continues to intermittent pain after meals.  No fevers no chills no evidence of biliary obstruction.  I did order an ultrasound that I have personally reviewed showing no evidence of gallstones.  Normal common bile duct for her age, some evidence of cirrhosis.  CBC and CMP that was completely nml.   Past Medical History:  Diagnosis Date  . Anxiety   . Breast cancer ([place]) 07/2016   right breast lumpectomy with chemo and mammosite  . Breast cancer of upper-outer quadrant of right female breast ([place]) 08/2016   1.0 cm ER+, PR-, Her 2 neu not overexpressed. Node negative. T1b, N0.  [name] Cervical cancer ([place]) 1990  . Hypertension   . Hypothyroidism   . Personal history of chemotherapy 11/2016-12/2016   right breast ca  . Personal history of radiation therapy 09/15/2016   mammosite  . PONV (postoperative nausea and vomiting)   . Thyroid disease     Past Surgical History:  Procedure Laterality Date  . ABDOMINAL HYSTERECTOMY    . BREAST BIOPSY Right 08/08/2016   invasive mammary carcinoma  . BREAST BIOPSY Right 06/01/2019   bx neg and abbcess drainage  . BREAST LUMPECTOMY Right 08/28/2016   invasive mammary carcinoma, clear margins, negative lymph nodes  . BREAST LUMPECTOMY WITH  SENTINEL LYMPH NODE BIOPSY Right 08/28/2016   Procedure: BREAST LUMPECTOMY WITH SENTINEL LYMPH NODE BX;  Surgeon: [name], MD;  Location: [facility] ORS;  Service: General;  Laterality: Right;  . BREAST MAMMOSITE Right 09/15/2016   Procedure: MAMMOSITE BREAST;  Surgeon: [name], MD;  Location: [facility] ORS;  Service: General;  Laterality: Right;  . BREAST SURGERY Right    Breast Biopsy  . CERVIX SURGERY    . COLONOSCOPY  2008  . EYE SURGERY Bilateral    Cataract Extraction with IOL  . PORTA CATH INSERTION Right 11/20/2016   Procedure: PORTA CATH INSERTION;  Surgeon: [name], MD;  Location: [facility] ORS;  Service: General;  Laterality: Right;  . PORTA CATH REMOVAL  2018  . RE-EXCISION OF BREAST LUMPECTOMY Right 07/14/2019   Procedure: RE-EXCISION OF Right BREAST LUMPECTOMY;  Surgeon: Jules Husbands, MD;  Location: [facility] ORS;  Service: General;  Laterality: Right;  . SHOULDER ARTHROSCOPY Right 2012   torn ligaments    Family History  Problem Relation Age of Onset  . Breast cancer Neg Hx     Social History:  reports that she has never smoked. She has never used smokeless tobacco. She reports that she does not drink alcohol and does not use drugs.  Allergies: No Known Allergies  Medications reviewed.    ROS Full ROS performed and is otherwise negative other than what is stated in HPI   BP 128/79  Pulse 68   Temp 98.5 F (36.9 C) (Oral)   Ht 5' (1.524 m)   Wt 149 lb 12.8 oz (67.9 kg)   SpO2 98%   BMI 29.26 kg/m   Physical Exam Vitals and nursing note reviewed. Exam conducted with a chaperone present.  Constitutional:      General: She is not in acute distress.    Appearance: Normal appearance. She is normal weight.  Eyes:     General: No scleral icterus.       Right eye: No discharge.        Left eye: No discharge.  Cardiovascular:     Rate and Rhythm: Normal rate and regular rhythm.  Pulmonary:     Effort: Pulmonary effort is normal. No respiratory  distress.     Breath sounds: Normal breath sounds. No stridor.  Abdominal:     General: There is no distension.     Palpations: There is no mass.     Tenderness: There is no abdominal tenderness. There is no guarding or rebound.     Hernia: No hernia is present.  Musculoskeletal:        General: No swelling or tenderness. Normal range of motion.     Cervical back: Normal range of motion and neck supple. No rigidity or tenderness.  Lymphadenopathy:     Cervical: No cervical adenopathy.  Skin:    General: Skin is warm and dry.     Capillary Refill: Capillary refill takes less than 2 seconds.  Neurological:     General: No focal deficit present.     Mental Status: She is alert and oriented to person, place, and time.  Psychiatric:        Mood and Affect: Mood normal.        Behavior: Behavior normal.        Thought Content: Thought content normal.        Judgment: Judgment normal.        Assessment/Plan: 64 year old female with chronic right upper quadrant pain that is not typical for any specific pathology.  Ultrasound and CT scan no evidence of biliary disease.  Given suspicious for potential biliary dyskinesia we will order a HIDA scan.  Other differentials will include colitis or IBS.  I will also obtain a gastroenterology consultation.  She has never had a colonoscopy and she will definitely benefit from a screening 1.  No need for emergent surgical intervention.  We will follow her up after she completes her GI evaluation and the HIDA scan.   Greater than 50% of the 30 minutes  visit was spent in counseling/coordination of care   [name], MD [place] Surgeon

## 2019-12-29 ENCOUNTER — Other Ambulatory Visit: Payer: Self-pay

## 2019-12-29 ENCOUNTER — Encounter
Admission: RE | Admit: 2019-12-29 | Discharge: 2019-12-29 | Disposition: A | Discharge status: Home / Self Care | Payer: Medicaid Other | Source: Ambulatory Visit | Attending: Surgery | Admitting: Surgery

## 2019-12-29 DIAGNOSIS — R109 Unspecified abdominal pain: Secondary | ICD-10-CM | POA: Insufficient documentation

## 2019-12-29 MED ORDER — TECHNETIUM TC 99M MEBROFENIN IV KIT
5.0000 | PACK | Freq: Once | INTRAVENOUS | Status: AC | PRN
  Administered 2019-12-29: 4.881 via INTRAVENOUS

## 2020-01-02 ENCOUNTER — Telehealth: Payer: Self-pay | Admitting: Emergency Medicine

## 2020-01-02 NOTE — Telephone Encounter (Signed)
Per Dr Dahlia Byes, HIDA came back technically normal will discuss more at f/u visit. Appt made for 01/11/20 at 1:45pm. Called daughter and made her aware.   Daughter also stated that GI would not be able to see mom until December. I called over to GI myself for earlier appt. Per Elmyra Ricks every doctor is booked out until then. Advised daughter that she should hear from GI soon to make that appt.

## 2020-01-11 ENCOUNTER — Encounter: Payer: Self-pay | Admitting: Surgery

## 2020-01-11 ENCOUNTER — Other Ambulatory Visit: Payer: Self-pay

## 2020-01-11 ENCOUNTER — Telehealth (INDEPENDENT_AMBULATORY_CARE_PROVIDER_SITE_OTHER): Payer: Medicaid Other | Admitting: Surgery

## 2020-01-11 DIAGNOSIS — R1011 Right upper quadrant pain: Secondary | ICD-10-CM

## 2020-01-11 NOTE — Progress Notes (Signed)
This service was provided via telemedicine.  The patient consented to the visit being carried via telemedicine. He understands the shortcomings of telemedicine, risks benefits and alternatives. Patient's location:  Home Provider's location:  Office People participating in this telemedicine visit:  Pt Called  the patient on her cell phone  Celiais following up with right upper quadrant pain.  She continues to have intermittent pains seems to be worsening with heavy meals.  I ordered a HIDA scan that was equivocal with a borderline but preserved ejection fraction.  She did not have any stones on her ultrasound please note that I have personally reviewed the ultrasound and CT scan.  CT scan had some early cirrhosis and I went ahead and did a CBC and CMP.  Completely normal LFTs and normal platelets. Discussed with her in detail about options and she wishes to pursue further GI work-up.  She does have an appointment with gastroenterology in the next month or so.  I also offer her potentially cholecystectomy if her symptoms persist.  I will see her back in 6-8 weeks  Time spent:  11 minutes in phone call and 22 minutes total including reviewing images, and coordinating her care Greater than 50% of the 22 minutes  visit was spent in counseling/coordination of care   [name], MD [place] Surgeon

## 2020-02-06 ENCOUNTER — Telehealth: Payer: Self-pay

## 2020-02-06 ENCOUNTER — Other Ambulatory Visit: Payer: Self-pay

## 2020-02-06 NOTE — Telephone Encounter (Signed)
Patient called office to let us know she has not received a call from Gastroenterologist. I have sent  A message to [place] to let them know.

## 2020-02-29 ENCOUNTER — Ambulatory Visit: Payer: Medicaid Other | Admitting: Surgery

## 2020-04-02 ENCOUNTER — Ambulatory Visit: Payer: Medicaid Other | Admitting: Gastroenterology

## 2020-04-05 ENCOUNTER — Ambulatory Visit: Payer: Medicaid Other | Admitting: Gastroenterology

## 2020-04-10 ENCOUNTER — Other Ambulatory Visit: Payer: Self-pay | Admitting: Urology

## 2020-04-11 ENCOUNTER — Encounter: Payer: Self-pay | Admitting: Gastroenterology

## 2020-04-11 ENCOUNTER — Ambulatory Visit (INDEPENDENT_AMBULATORY_CARE_PROVIDER_SITE_OTHER): Payer: Medicaid Other | Admitting: Gastroenterology

## 2020-04-11 ENCOUNTER — Other Ambulatory Visit: Payer: Self-pay

## 2020-04-11 VITALS — BP 113/70 | HR 85 | Temp 98.3°F | Ht 60.0 in | Wt 148.8 lb

## 2020-04-11 DIAGNOSIS — Z1211 Encounter for screening for malignant neoplasm of colon: Secondary | ICD-10-CM

## 2020-04-11 DIAGNOSIS — K7460 Unspecified cirrhosis of liver: Secondary | ICD-10-CM

## 2020-04-11 DIAGNOSIS — M549 Dorsalgia, unspecified: Secondary | ICD-10-CM | POA: Diagnosis not present

## 2020-04-11 DIAGNOSIS — Z13810 Encounter for screening for upper gastrointestinal disorder: Secondary | ICD-10-CM

## 2020-04-11 MED ORDER — SUPREP BOWEL PREP KIT 17.5-3.13-1.6 GM/177ML PO SOLN: 1.0000 | ORAL | 0 refills | Status: DC

## 2020-04-11 NOTE — Progress Notes (Signed)
Jonathon Bellows MD, MRCP(U.K) [address]  [place]  [place], [place] [address]  Main: [phone]  Fax: [phone]   Gastroenterology Consultation  Referring Provider:     Jules Husbands, MD Primary Care Physician:  Denton Lank, MD Primary Gastroenterologist:  Dr. Jonathon Bellows  Reason for Consultation:     Abdominal pain        HPI:   Wanda Reed is a 65 y.o. referred for abdominal pain.  She sees Dr. Dahlia Byes for right upper quadrant pain.  Worse after heavy meals.  Had a HIDA scan that demonstrated an ejection fraction of 36% ultrasound of the abdomen in 12/05/2019 demonstrated cirrhotic liver.  CT scan of the abdomen pelvis in April 2021 demonstrated cirrhotic liver.  Hydronephrosis was noted in the right kidney .   In 12/05/2019 LFTs were completely normal.  Hemoglobin was 12.5 g with an MCV of 90. The whole visit was via an interpreter who accompanied the patient in person.  The patient states that she has had right-sided pain for over 15 years but has recently got worse.  It is present all the time.  Can get worse few hours after meals later in the day.  The pain begins in her back radiates to the front of her abdomen and goes down shooting through her right leg all the way into her foot.  Resting relieves the pain.  She suffers from constipation but has been given medications for the same.  Having a bowel movement does not relieve the constipation.  She takes Tylenol for the back pain.  She denies any NSAID use.  She denies any prior knowledge of having liver cirrhosis.  No history of alcohol excess consumption.  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  Mother had liver disease unknown what type.  Denies any incarceration.  Denies any weight loss.  Last colonoscopy was over 30 years back. Past Medical History:  Diagnosis Date  . Anxiety   . Breast cancer ([place]) 07/2016   right breast lumpectomy with chemo and mammosite  . Breast cancer of  upper-outer quadrant of right female breast ([place]) 08/2016   1.0 cm ER+, PR-, Her 2 neu not overexpressed. Node negative. T1b, N0.  [name] Cervical cancer ([place]) 1990  . Hypertension   . Hypothyroidism   . Personal history of chemotherapy 11/2016-12/2016   right breast ca  . Personal history of radiation therapy 09/15/2016   mammosite  . PONV (postoperative nausea and vomiting)   . Thyroid disease     Past Surgical History:  Procedure Laterality Date  . ABDOMINAL HYSTERECTOMY    . BREAST BIOPSY Right 08/08/2016   invasive mammary carcinoma  . BREAST BIOPSY Right 06/01/2019   bx neg and abbcess drainage  . BREAST LUMPECTOMY Right 08/28/2016   invasive mammary carcinoma, clear margins, negative lymph nodes  . BREAST LUMPECTOMY WITH SENTINEL LYMPH NODE BIOPSY Right 08/28/2016   Procedure: BREAST LUMPECTOMY WITH SENTINEL LYMPH NODE BX;  Surgeon: [name], MD;  Location: [facility] OR[facility];  Service: General;  Laterality: Right;  . BREAST MAMMOSITE Right 09/15/2016   Procedure: MAMMOSITE BREAST;  Surgeon: [name], MD;  Location: [facility] ORS;  Service: General;  Laterality: Right;  . BREAST SURGERY Right    Breast Biopsy  . CERVIX SURGERY    . COLONOSCOPY  2008  . EYE SURGERY Bilateral    Cataract Extraction with IOL  . PORTA CATH INSERTION Right 11/20/2016   Procedure: PORTA  CATH INSERTION;  Surgeon: [name], MD;  Location: [facility] ORS;  Service: General;  Laterality: Right;  . PORTA CATH REMOVAL  2018  . RE-EXCISION OF BREAST LUMPECTOMY Right 07/14/2019   Procedure: RE-EXCISION OF Right BREAST LUMPECTOMY;  Surgeon: Jules Husbands, MD;  Location: [facility] ORS;  Service: General;  Laterality: Right;  . SHOULDER ARTHROSCOPY Right 2012   torn ligaments    Prior to Admission medications   Medication Sig Start Date End Date Taking? Authorizing Provider  acyclovir (ZOVIRAX) 400 MG tablet Take 400 mg by mouth 3 (three) times daily as needed. 11/29/19  Yes [provider]   letrozole (FEMARA) 2.5 MG tablet TAKE 1 TABLET BY MOUTH ONCE DAILY. 10/20/19  Yes Lloyd Huger, MD  levothyroxine (SYNTHROID, LEVOTHROID) 100 MCG tablet Take 100 mcg by mouth daily before breakfast.   Yes [provider]  lisinopril (PRINIVIL,ZESTRIL) 10 MG tablet Take 10 mg by mouth daily.   Yes [provider]  MULTIPLE VITAMIN PO Take 1 tablet by mouth daily.   Yes [provider]  senna-docusate (SENOKOT-S) 8.6-50 MG tablet Take 1 tablet by mouth 2 (two) times daily. 09/01/19  Yes McGowan, Larene Beach A, PA-C  mirabegron ER (MYRBETRIQ) 25 MG TB24 tablet Take 1 tablet (25 mg total) by mouth daily. Patient not taking: Reported on 04/11/2020 09/01/19   Zara Council A, PA-C  potassium chloride SA (KLOR-CON) 20 MEQ tablet Take 20 mEq by mouth 2 (two) times daily. Patient not taking: Reported on 04/11/2020 09/06/19   [provider]    Family History  Problem Relation Age of Onset  . Breast cancer Neg Hx      Social History   Tobacco Use  . Smoking status: Never Smoker  . Smokeless tobacco: Never Used  Vaping Use  . Vaping Use: Never used  Substance Use Topics  . Alcohol use: No  . Drug use: No    Allergies as of 04/11/2020  . (No Known Allergies)    Review of Systems:    All systems reviewed and negative except where noted in HPI.   Physical Exam:  BP 113/70   Pulse 85   Temp 98.3 F (36.8 C) (Oral)   Ht 5' (1.524 m)   Wt 148 lb 12.8 oz (67.5 kg)   BMI 29.06 kg/m  No LMP recorded. Patient has had a hysterectomy. Psych:  Alert and cooperative. Normal mood and affect. General:   Alert,  Well-developed, well-nourished, pleasant and cooperative in NAD Head:  Normocephalic and atraumatic. Eyes:  Sclera clear, no icterus.   Conjunctiva pink. Ears:  Normal auditory acuity. Lungs:  Respirations even and unlabored.  Clear throughout to auscultation.   No wheezes, crackles, or rhonchi. No acute distress. Heart:  Regular rate and rhythm; no  murmurs, clicks, rubs, or gallops. Abdomen:  Normal bowel sounds.  No bruits.  Soft, non-tender and non-distended without masses, hepatosplenomegaly or hernias noted.  No guarding or rebound tenderness.    Examination of the back: Tenderness over right lumbar paraspinal muscles.  Replicates the pain that she complains of.  Straight leg raising test on the right side replicates the pain with relief of the pain with flexion at the knee. Neurologic:  Alert and oriented x3;  grossly normal neurologically. Skin:  Intact without significant lesions or rashes. No jaundice. Lymph Nodes:  No significant cervical adenopathy. Psych:  Alert and cooperative. Normal mood and affect.  Imaging Studies: No results found.  Assessment and Plan:   Wanda Reed is a 65 y.o. y/o female has been referred for right-sided abdominal pain.  Ongoing for more than 15 years recently worse.  On examination her straight leg raising test on the right side was positive, there was tenderness in the right paraspinal muscles.  This was similar to the pain that she complains of.  Pain relieved with flexion at the knee suggesting impingement of the lumbosacral nerves at the level of the spine.  The pain that she has radiating to the anterior aspect of her abdomen could be compression of a nerve radical.  New diagnosis of liver cirrhosis unknown etiology mother had liver disease.  History of tattoos over 20 years back.  I will screen for autoimmune and viral hepatitis.  I will perform an EGD for dyspepsia as well as to screen for esophageal varices and perform a colonoscopy at the same time to screen for colon cancer.  I will see her back in 3 months to discuss the results of her labs and to follow-up for chronic liver disease.  I would strongly suggest that she sees an orthopedic doctor or a physical therapist to evaluate her back pain.  I do not believe that this pain would be relieved by a cholecystectomy or any other GI  intervention    Follow up in 3 months  Dr Jonathon Bellows MD,MRCP(U.K)

## 2020-04-16 ENCOUNTER — Telehealth: Payer: Self-pay | Admitting: Gastroenterology

## 2020-04-16 ENCOUNTER — Other Ambulatory Visit: Payer: Self-pay | Admitting: Oncology

## 2020-04-16 LAB — HIV ANTIBODY (ROUTINE TESTING W REFLEX): HIV Screen 4th Generation wRfx: NONREACTIVE

## 2020-04-16 LAB — CERULOPLASMIN: Ceruloplasmin: 25.4 mg/dL (ref 19.0–39.0)

## 2020-04-16 LAB — HEPATITIS B E ANTIGEN: Hep B E Ag: NEGATIVE

## 2020-04-16 LAB — HEPATITIS B E ANTIBODY: Hep B E Ab: NEGATIVE

## 2020-04-16 LAB — IRON,TIBC AND FERRITIN PANEL
Ferritin: 29 ng/mL (ref 15–150)
Iron Saturation: 19 % (ref 15–55)
Iron: 78 ug/dL (ref 27–139)
Total Iron Binding Capacity: 403 ug/dL (ref 250–450)
UIBC: 325 ug/dL (ref 118–369)

## 2020-04-16 LAB — MITOCHONDRIAL/SMOOTH MUSCLE AB PNL
Mitochondrial Ab: <20 Units (ref 0.0–20.0)
Smooth Muscle Ab: 6 Units (ref 0–19)

## 2020-04-16 LAB — IMMUNOGLOBULINS A/E/G/M, SERUM
IgA/Immunoglobulin A, Serum: 422 mg/dL — ABNORMAL HIGH (ref 87–352)
IgE (Immunoglobulin E), Serum: 86 IU/mL (ref 6–495)
IgG (Immunoglobin G), Serum: 1457 mg/dL (ref 586–1602)
IgM (Immunoglobulin M), Srm: 190 mg/dL (ref 26–217)

## 2020-04-16 LAB — CK: Total CK: 137 U/L (ref 32–182)

## 2020-04-16 LAB — ANA: Anti Nuclear Antibody (ANA): NEGATIVE

## 2020-04-16 LAB — HEPATITIS B SURFACE ANTIBODY,QUALITATIVE: Hep B Surface Ab, Qual: NONREACTIVE

## 2020-04-16 LAB — PROTIME-INR
INR: 1 (ref 0.9–1.2)
Prothrombin Time: 10.8 s (ref 9.1–12.0)

## 2020-04-16 LAB — HEPATITIS A ANTIBODY, TOTAL: hep A Total Ab: POSITIVE — AB

## 2020-04-16 LAB — ALPHA-1-ANTITRYPSIN: A-1 Antitrypsin: 152 mg/dL (ref 101–187)

## 2020-04-16 LAB — CELIAC DISEASE AB SCREEN W/RFX
Antigliadin Abs, IgA: 4 units (ref 0–19)
Transglutaminase IgA: <2 U/mL (ref 0–3)

## 2020-04-16 LAB — HEPATITIS C ANTIBODY: Hep C Virus Ab: <0.1 s/co ratio (ref 0.0–0.9)

## 2020-04-16 LAB — HEPATITIS B SURFACE ANTIGEN: Hepatitis B Surface Ag: NEGATIVE

## 2020-04-16 LAB — HEPATITIS B CORE ANTIBODY, TOTAL: Hep B Core Total Ab: NEGATIVE

## 2020-04-16 LAB — ANTI-MICROSOMAL ANTIBODY LIVER / KIDNEY: LKM1 Ab: 0.8 Units (ref 0.0–20.0)

## 2020-04-16 NOTE — Progress Notes (Signed)
Informed patient's daughter of the lab results. Explained to [place] her mother will need a Hep B vaccine. This can be done at her primary care physician office. Letter will be sent via my chart as well.

## 2020-04-16 NOTE — Telephone Encounter (Signed)
Patient's daughter called to ask about results,  Daughter wants to know if her Mother needs OV before her procedure

## 2020-04-16 NOTE — Telephone Encounter (Signed)
Patient's daughter would like to know her mother's lab results. Please advise.

## 2020-04-16 NOTE — Telephone Encounter (Signed)
See result note.  

## 2020-04-17 ENCOUNTER — Ambulatory Visit (LOCAL_COMMUNITY_HEALTH_CENTER): Payer: Medicaid Other

## 2020-04-17 ENCOUNTER — Other Ambulatory Visit: Payer: Self-pay

## 2020-04-17 DIAGNOSIS — Z23 Encounter for immunization: Secondary | ICD-10-CM

## 2020-04-17 NOTE — Progress Notes (Signed)
Requests Hep B per her doctor's recommendation. Tolerated Hep B well today in Left Deltoid. Per pt, hx surgery Right Breast. Updated [facility] copy given and recommended schedule explained. Dot Lanes, intepreter today. Josie Saunders, RN

## 2020-04-22 ENCOUNTER — Encounter: Payer: Self-pay | Admitting: Gastroenterology

## 2020-04-24 ENCOUNTER — Other Ambulatory Visit: Payer: Self-pay

## 2020-04-24 ENCOUNTER — Other Ambulatory Visit
Admission: RE | Admit: 2020-04-24 | Discharge: 2020-04-24 | Disposition: A | Discharge status: Home / Self Care | Payer: Medicaid Other | Source: Ambulatory Visit | Attending: Gastroenterology | Admitting: Gastroenterology

## 2020-04-24 DIAGNOSIS — Z20822 Contact with and (suspected) exposure to covid-19: Secondary | ICD-10-CM | POA: Insufficient documentation

## 2020-04-24 DIAGNOSIS — Z01812 Encounter for preprocedural laboratory examination: Secondary | ICD-10-CM | POA: Diagnosis present

## 2020-04-25 ENCOUNTER — Encounter: Payer: Self-pay | Admitting: Gastroenterology

## 2020-04-25 LAB — SARS CORONAVIRUS 2 (TAT 6-24 HRS): SARS Coronavirus 2: NEGATIVE

## 2020-04-26 ENCOUNTER — Ambulatory Visit: Payer: Medicaid Other | Admitting: Certified Registered Nurse Anesthetist

## 2020-04-26 ENCOUNTER — Ambulatory Visit
Admission: RE | Admit: 2020-04-26 | Discharge: 2020-04-26 | Disposition: A | Discharge status: Home / Self Care | Payer: Medicaid Other | Attending: Gastroenterology | Admitting: Gastroenterology

## 2020-04-26 ENCOUNTER — Encounter
Admission: RE | Disposition: A | Discharge status: Home / Self Care | Payer: Self-pay | Source: Home / Self Care | Attending: Gastroenterology

## 2020-04-26 ENCOUNTER — Encounter: Payer: Self-pay | Admitting: Gastroenterology

## 2020-04-26 DIAGNOSIS — Z79890 Hormone replacement therapy (postmenopausal): Secondary | ICD-10-CM | POA: Diagnosis not present

## 2020-04-26 DIAGNOSIS — K7460 Unspecified cirrhosis of liver: Secondary | ICD-10-CM | POA: Insufficient documentation

## 2020-04-26 DIAGNOSIS — Z9221 Personal history of antineoplastic chemotherapy: Secondary | ICD-10-CM | POA: Diagnosis not present

## 2020-04-26 DIAGNOSIS — D122 Benign neoplasm of ascending colon: Secondary | ICD-10-CM | POA: Diagnosis not present

## 2020-04-26 DIAGNOSIS — Z13810 Encounter for screening for upper gastrointestinal disorder: Secondary | ICD-10-CM

## 2020-04-26 DIAGNOSIS — Z8541 Personal history of malignant neoplasm of cervix uteri: Secondary | ICD-10-CM | POA: Insufficient documentation

## 2020-04-26 DIAGNOSIS — Z170 Estrogen receptor positive status [ER+]: Secondary | ICD-10-CM | POA: Diagnosis not present

## 2020-04-26 DIAGNOSIS — C50411 Malignant neoplasm of upper-outer quadrant of right female breast: Secondary | ICD-10-CM | POA: Insufficient documentation

## 2020-04-26 DIAGNOSIS — D123 Benign neoplasm of transverse colon: Secondary | ICD-10-CM | POA: Insufficient documentation

## 2020-04-26 DIAGNOSIS — Z923 Personal history of irradiation: Secondary | ICD-10-CM | POA: Diagnosis not present

## 2020-04-26 DIAGNOSIS — K635 Polyp of colon: Secondary | ICD-10-CM | POA: Diagnosis not present

## 2020-04-26 DIAGNOSIS — Z1211 Encounter for screening for malignant neoplasm of colon: Secondary | ICD-10-CM | POA: Diagnosis not present

## 2020-04-26 DIAGNOSIS — Z79899 Other long term (current) drug therapy: Secondary | ICD-10-CM | POA: Insufficient documentation

## 2020-04-26 HISTORY — PX: ESOPHAGOGASTRODUODENOSCOPY (EGD) WITH PROPOFOL: SHX5813

## 2020-04-26 HISTORY — PX: COLONOSCOPY WITH PROPOFOL: SHX5780

## 2020-04-26 SURGERY — COLONOSCOPY WITH PROPOFOL
Anesthesia: General

## 2020-04-26 MED ORDER — PROPOFOL 10 MG/ML IV BOLUS
INTRAVENOUS | Status: DC | PRN
  Administered 2020-04-26: 40 mg via INTRAVENOUS
  Administered 2020-04-26: 50 mg via INTRAVENOUS

## 2020-04-26 MED ORDER — EPHEDRINE SULFATE 50 MG/ML IJ SOLN
INTRAMUSCULAR | Status: DC | PRN
  Administered 2020-04-26: 10 mg via INTRAVENOUS

## 2020-04-26 MED ORDER — SODIUM CHLORIDE 0.9 % IV SOLN
INTRAVENOUS | Status: DC
  Administered 2020-04-26: 1000 mL via INTRAVENOUS

## 2020-04-26 MED ORDER — ONDANSETRON HCL 4 MG/2ML IJ SOLN
INTRAMUSCULAR | Status: DC | PRN
  Administered 2020-04-26: 4 mg via INTRAVENOUS

## 2020-04-26 MED ORDER — GLYCOPYRROLATE 0.2 MG/ML IJ SOLN
INTRAMUSCULAR | Status: DC | PRN
  Administered 2020-04-26: .1 mg via INTRAVENOUS

## 2020-04-26 MED ORDER — LIDOCAINE HCL (CARDIAC) PF 100 MG/5ML IV SOSY
PREFILLED_SYRINGE | INTRAVENOUS | Status: DC | PRN
  Administered 2020-04-26: 100 mg via INTRAVENOUS

## 2020-04-26 MED ORDER — DEXAMETHASONE SODIUM PHOSPHATE 10 MG/ML IJ SOLN
INTRAMUSCULAR | Status: DC | PRN
  Administered 2020-04-26: 10 mg via INTRAVENOUS

## 2020-04-26 MED ORDER — GLYCOPYRROLATE 0.2 MG/ML IJ SOLN
INTRAMUSCULAR | Status: AC
  Filled 2020-04-26: qty 1

## 2020-04-26 MED ORDER — PROPOFOL 500 MG/50ML IV EMUL
INTRAVENOUS | Status: DC | PRN
  Administered 2020-04-26: 160 ug/kg/min via INTRAVENOUS

## 2020-04-26 NOTE — Op Note (Signed)
[facility] Gastroenterology Patient Name: Wanda Reed Procedure Date: 04/26/2020 9:18 AM MRN: [id number] Account #: [id number] Date of Birth: [birth year] Admit Type: Outpatient Age: 65 Room: [facility] ENDO ROOM 3 Gender: Female Note Status: Finalized Procedure:             Upper GI endoscopy Indications:           Cirrhosis rule out esophageal varices Providers:             Jonathon Bellows MD, MD Referring MD:          Denton Lank MD, MD (Referring MD) Medicines:             Monitored Anesthesia Care Complications:         No immediate complications. Procedure:             Pre-Anesthesia Assessment:                        - Prior to the procedure, a History and Physical was                         performed, and patient medications, allergies and                         sensitivities were reviewed. The patient's tolerance                         of previous anesthesia was reviewed.                        - The risks and benefits of the procedure and the                         sedation options and risks were discussed with the                         patient. All questions were answered and informed                         consent was obtained.                        - ASA Grade Assessment: III - A patient with severe                         systemic disease.                        After obtaining informed consent, the endoscope was                         passed under direct vision. Throughout the procedure,                         the patient's blood pressure, pulse, and oxygen                         saturations were monitored continuously. The Endoscope                         was  introduced through the mouth, and advanced to the                         third part of duodenum. The upper GI endoscopy was                         accomplished with ease. The patient tolerated the                         procedure well. Findings:      The esophagus was  normal.      The stomach was normal.      The examined duodenum was normal. Impression:            - Normal esophagus.                        - Normal stomach.                        - Normal examined duodenum.                        - No specimens collected. Recommendation:        - Repeat upper endoscopy in 3 years for surveillance.                        - Perform a colonoscopy today. Procedure Code(s):     --- Professional ---                        [phone], Esophagogastroduodenoscopy, flexible,                         transoral; diagnostic, including collection of                         specimen(s) by brushing or washing, when performed                         (separate procedure) Diagnosis Code(s):     --- Professional ---                        K74.60, Unspecified cirrhosis of liver CPT copyright 2019 American Medical Association. All rights reserved. The codes documented in this report are preliminary and upon coder review may  be revised to meet current compliance requirements. Jonathon Bellows, MD Jonathon Bellows MD, MD 04/26/2020 9:26:43 AM This report has been signed electronically. Number of Addenda: 0 Note Initiated On: 04/26/2020 9:18 AM Estimated Blood Loss:  Estimated blood loss: none.      [facility]

## 2020-04-26 NOTE — Anesthesia Preprocedure Evaluation (Signed)
Anesthesia Evaluation  Patient identified by MRN, date of birth, ID band Patient awake    Reviewed: Allergy & Precautions, H&P , NPO status , Patient's Chart, lab work & pertinent test results, reviewed documented beta blocker date and time   History of Anesthesia Complications (+) PONV and history of anesthetic complications  Airway Mallampati: II   Neck ROM: full    Dental  (+) Poor Dentition   Pulmonary neg pulmonary ROS,    Pulmonary exam normal        Cardiovascular Exercise Tolerance: Poor hypertension, On Medications negative cardio ROS Normal cardiovascular exam Rhythm:regular Rate:Normal     Neuro/Psych Anxiety negative neurological ROS  negative psych ROS   GI/Hepatic negative GI ROS, Neg liver ROS,   Endo/Other  Hypothyroidism   Renal/GU negative Renal ROS  negative genitourinary   Musculoskeletal   Abdominal   Peds  Hematology negative hematology ROS (+)   Anesthesia Other Findings Past Medical History: No date: Anxiety 07/2016: Breast cancer ([place])     Comment:  right breast lumpectomy with chemo and mammosite 08/2016: Breast cancer of upper-outer quadrant of right female breast  ([place])     Comment:  1.0 cm ER+, PR-, Her 2 neu not overexpressed. Node               negative. T1b, N0. 1990: Cervical cancer ([place]) No date: Hypertension No date: Hypothyroidism 11/2016-12/2016: Personal history of chemotherapy     Comment:  right breast ca 09/15/2016: Personal history of radiation therapy     Comment:  mammosite No date: PONV (postoperative nausea and vomiting) No date: Thyroid disease Past Surgical History: No date: ABDOMINAL HYSTERECTOMY 08/08/2016: BREAST BIOPSY; Right     Comment:  invasive mammary carcinoma 06/01/2019: BREAST BIOPSY; Right     Comment:  bx neg and abbcess drainage 08/28/2016: BREAST LUMPECTOMY; Right     Comment:  invasive mammary carcinoma, clear margins, negative                lymph nodes 08/28/2016: BREAST LUMPECTOMY WITH SENTINEL LYMPH NODE BIOPSY; Right     Comment:  Procedure: BREAST LUMPECTOMY WITH SENTINEL LYMPH NODE               BX;  Surgeon: [name], MD;  Location: [facility]               ORS;  Service: General;  Laterality: Right; 09/15/2016: BREAST MAMMOSITE; Right     Comment:  Procedure: MAMMOSITE BREAST;  Surgeon: [name], MD;  Location: [facility] ORS;  Service: General;                Laterality: Right; No date: BREAST SURGERY; Right     Comment:  Breast Biopsy No date: CERVIX SURGERY 2008: COLONOSCOPY No date: EYE SURGERY; Bilateral     Comment:  Cataract Extraction with IOL 11/20/2016: PORTA CATH INSERTION; Right     Comment:  Procedure: PORTA CATH INSERTION;  Surgeon: [name], MD;  Location: [facility] ORS;  Service: General;                Laterality: Right; 2018: PORTA CATH REMOVAL 07/14/2019: RE-EXCISION OF BREAST LUMPECTOMY; Right     Comment:  Procedure: RE-EXCISION OF Right BREAST LUMPECTOMY;                Surgeon:  Jules Husbands, MD;  Location: [facility] ORS;                Service: General;  Laterality: Right; 2012: SHOULDER ARTHROSCOPY; Right     Comment:  torn ligaments   Reproductive/Obstetrics negative OB ROS                             Anesthesia Physical Anesthesia Plan  ASA: III  Anesthesia Plan: General   Post-op Pain Management:    Induction:   PONV Risk Score and Plan:   Airway Management Planned:   Additional Equipment:   Intra-op Plan:   Post-operative Plan:   Informed Consent: I have reviewed the patients History and Physical, chart, labs and discussed the procedure including the risks, benefits and alternatives for the proposed anesthesia with the patient or authorized representative who has indicated his/her understanding and acceptance.     Dental Advisory Given  Plan Discussed with: CRNA  Anesthesia Plan Comments:          Anesthesia Quick Evaluation

## 2020-04-26 NOTE — H&P (Signed)
Wanda Bellows, MD [address], [place], [place], [place], [address] [address], [place], [place], [place], [address] Phone: [phone]  Fax: [phone]  Primary Care Physician:  Denton Lank, MD   Pre-Procedure History & Physical: HPI:  Wanda Reed is a 65 y.o. female is here for an endoscopy and colonoscopy    Past Medical History:  Diagnosis Date  . Anxiety   . Breast cancer ([place]) 07/2016   right breast lumpectomy with chemo and mammosite  . Breast cancer of upper-outer quadrant of right female breast ([place]) 08/2016   1.0 cm ER+, PR-, Her 2 neu not overexpressed. Node negative. T1b, N0.  [name] Cervical cancer ([place]) 1990  . Hypertension   . Hypothyroidism   . Personal history of chemotherapy 11/2016-12/2016   right breast ca  . Personal history of radiation therapy 09/15/2016   mammosite  . PONV (postoperative nausea and vomiting)   . Thyroid disease     Past Surgical History:  Procedure Laterality Date  . ABDOMINAL HYSTERECTOMY    . BREAST BIOPSY Right 08/08/2016   invasive mammary carcinoma  . BREAST BIOPSY Right 06/01/2019   bx neg and abbcess drainage  . BREAST LUMPECTOMY Right 08/28/2016   invasive mammary carcinoma, clear margins, negative lymph nodes  . BREAST LUMPECTOMY WITH SENTINEL LYMPH NODE BIOPSY Right 08/28/2016   Procedure: BREAST LUMPECTOMY WITH SENTINEL LYMPH NODE BX;  Surgeon: [name], MD;  Location: [facility] ORS;  Service: General;  Laterality: Right;  . BREAST MAMMOSITE Right 09/15/2016   Procedure: MAMMOSITE BREAST;  Surgeon: [name], MD;  Location: [facility] ORS;  Service: General;  Laterality: Right;  . BREAST SURGERY Right    Breast Biopsy  . CERVIX SURGERY    . COLONOSCOPY  2008  . EYE SURGERY Bilateral    Cataract Extraction with IOL  . PORTA CATH INSERTION Right 11/20/2016   Procedure: PORTA CATH INSERTION;  Surgeon: [name], MD;  Location: [facility] ORS;  Service: General;  Laterality: Right;  . PORTA  CATH REMOVAL  2018  . RE-EXCISION OF BREAST LUMPECTOMY Right 07/14/2019   Procedure: RE-EXCISION OF Right BREAST LUMPECTOMY;  Surgeon: Jules Husbands, MD;  Location: [facility] ORS;  Service: General;  Laterality: Right;  . SHOULDER ARTHROSCOPY Right 2012   torn ligaments    Prior to Admission medications   Medication Sig Start Date End Date Taking? Authorizing Provider  acyclovir (ZOVIRAX) 400 MG tablet Take 400 mg by mouth 3 (three) times daily as needed. 11/29/19  Yes [provider]  letrozole (FEMARA) 2.5 MG tablet TAKE 1 TABLET BY MOUTH ONCE DAILY. 04/16/20  Yes Lloyd Huger, MD  levothyroxine (SYNTHROID, LEVOTHROID) 100 MCG tablet Take 100 mcg by mouth daily before breakfast.   Yes [provider]  lisinopril (PRINIVIL,ZESTRIL) 10 MG tablet Take 10 mg by mouth daily.   Yes [provider]  MULTIPLE VITAMIN PO Take 1 tablet by mouth daily.   Yes [provider]  Na Sulfate-K Sulfate-Mg Sulf (SUPREP BOWEL PREP KIT) 17.5-3.13-1.6 GM/177ML SOLN Take 1 kit by mouth as directed. 04/11/20  Yes Wanda Bellows, MD  mirabegron ER (MYRBETRIQ) 25 MG TB24 tablet Take 1 tablet (25 mg total) by mouth daily. Patient not taking: Reported on 04/11/2020 09/01/19   Zara Council A, PA-C  potassium chloride SA (KLOR-CON) 20 MEQ tablet Take 20 mEq by mouth 2 (two) times daily. Patient not taking: Reported on 04/11/2020 09/06/19   [provider]  senna-docusate (  SENOKOT-S) 8.6-50 MG tablet Take 1 tablet by mouth 2 (two) times daily. Patient not taking: Reported on 04/26/2020 09/01/19   Zara Council A, PA-C    Allergies as of 04/11/2020  . (No Known Allergies)    Family History  Problem Relation Age of Onset  . Breast cancer Neg Hx     Social History   Socioeconomic History  . Marital status: Married    Spouse name: Not on file  . Number of children: Not on file  . Years of education: Not on file  . Highest education level: Not on file  Occupational  History  . Not on file  Tobacco Use  . Smoking status: Never Smoker  . Smokeless tobacco: Never Used  Vaping Use  . Vaping Use: Never used  Substance and Sexual Activity  . Alcohol use: No  . Drug use: No  . Sexual activity: Yes    Birth control/protection: Post-menopausal  Other Topics Concern  . Not on file  Social History Narrative  . Not on file   Social Determinants of Health   Financial Resource Strain: Not on file  Food Insecurity: Not on file  Transportation Needs: Not on file  Physical Activity: Not on file  Stress: Not on file  Social Connections: Not on file  Intimate Partner Violence: Not on file    Review of Systems: See HPI, otherwise negative ROS  Physical Exam: BP (!) 146/80   Pulse 80   Temp (!) 97.2 F (36.2 C) (Temporal)   Resp 16   Ht _0  (1.549 m)   Wt 64.2 kg   SpO2 98%   BMI 26.76 kg/m  General:   Alert,  pleasant and cooperative in NAD Head:  Normocephalic and atraumatic. Neck:  Supple; no masses or thyromegaly. Lungs:  Clear throughout to auscultation, normal respiratory effort.    Heart:  +S1, +S2, Regular rate and rhythm, No edema. Abdomen:  Soft, nontender and nondistended. Normal bowel sounds, without guarding, and without rebound.   Neurologic:  Alert and  oriented x4;  grossly normal neurologically.  Impression/Plan: Wanda Reed is here for an endoscopy and colonoscopy  to be performed to   screen for esophageal varices and colon cancer screening    Risks, benefits, limitations, and alternatives regarding endoscopy have been reviewed with the patient.  Questions have been answered.  All parties agreeable.   Wanda Bellows, MD  04/26/2020, 9:08 AM

## 2020-04-26 NOTE — Transfer of Care (Signed)
Immediate Anesthesia Transfer of Care Note  Patient: Wanda Reed  Procedure(s) Performed: COLONOSCOPY WITH PROPOFOL (N/A ) ESOPHAGOGASTRODUODENOSCOPY (EGD) WITH PROPOFOL (N/A )  Patient Location: PACU  Anesthesia Type:General  Level of Consciousness: awake, alert  and oriented  Airway & Oxygen Therapy: Patient Spontanous Breathing and Patient connected to nasal cannula oxygen  Post-op Assessment: Report given to RN and Post -op Vital signs reviewed and stable  Post vital signs: Reviewed and stable  Last Vitals:  Vitals Value Taken Time  BP 105/54 04/26/20 0959  Temp 36.7 C 04/26/20 0958  Pulse 103 04/26/20 1003  Resp 22 04/26/20 1003  SpO2 100 % 04/26/20 1003  Vitals shown include unvalidated device data.  Last Pain:  Vitals:   04/26/20 0958  TempSrc: Temporal  PainSc: 0-No pain         Complications: No complications documented.

## 2020-04-26 NOTE — Op Note (Signed)
[facility] Gastroenterology Patient Name: Wanda Reed Procedure Date: 04/26/2020 9:17 AM MRN: [id number] Account #: [id number] Date of Birth: 10-23-1955 Admit Type: Outpatient Age: 65 Room: [facility] ENDO ROOM 3 Gender: Female Note Status: Finalized Procedure:             Colonoscopy Indications:           Screening for colorectal malignant neoplasm Providers:             Jonathon Bellows MD, MD Referring MD:          Denton Lank MD, MD (Referring MD) Medicines:             Monitored Anesthesia Care Complications:         No immediate complications. Procedure:             Pre-Anesthesia Assessment:                        - Prior to the procedure, a History and Physical was                         performed, and patient medications, allergies and                         sensitivities were reviewed. The patient's tolerance                         of previous anesthesia was reviewed.                        - The risks and benefits of the procedure and the                         sedation options and risks were discussed with the                         patient. All questions were answered and informed                         consent was obtained.                        - ASA Grade Assessment: III - A patient with severe                         systemic disease.                        After obtaining informed consent, the colonoscope was                         passed under direct vision. Throughout the procedure,                         the patient's blood pressure, pulse, and oxygen                         saturations were monitored continuously. The                         Colonoscope was introduced through  the anus and                         advanced to the the cecum, identified by the                         appendiceal orifice. The colonoscopy was performed                         with ease. The patient tolerated the procedure well.                          The quality of the bowel preparation was excellent. Findings:      The perianal and digital rectal examinations were normal.      Five sessile polyps were found in the ascending colon. The polyps were 4       to 5 mm in size. These polyps were removed with a cold snare. Resection       and retrieval were complete.      Two sessile polyps were found in the transverse colon. The polyps were 4       to 5 mm in size. These polyps were removed with a cold snare. Resection       and retrieval were complete.      The exam was otherwise without abnormality on direct and retroflexion       views. Impression:            - Five 4 to 5 mm polyps in the ascending colon,                         removed with a cold snare. Resected and retrieved.                        - Two 4 to 5 mm polyps in the transverse colon,                         removed with a cold snare. Resected and retrieved.                        - The examination was otherwise normal on direct and                         retroflexion views. Recommendation:        - Discharge patient to home (with escort).                        - Resume previous diet.                        - Continue present medications.                        - Await pathology results.                        - Repeat colonoscopy for surveillance based on                         pathology results. Procedure Code(s):     ---  Professional ---                        [phone], Colonoscopy, flexible; with removal of                         tumor(s), polyp(s), or other lesion(s) by snare                         technique Diagnosis Code(s):     --- Professional ---                        Z12.11, Encounter for screening for malignant neoplasm                         of colon                        K63.5, Polyp of colon CPT copyright 2019 [employer]. All rights reserved. The codes documented in this report are preliminary and upon coder review may  be revised  to meet current compliance requirements. Jonathon Bellows, MD Jonathon Bellows MD, MD 04/26/2020 9:53:30 AM This report has been signed electronically. Number of Addenda: 0 Note Initiated On: 04/26/2020 9:17 AM Scope Withdrawal Time: 0 hours 11 minutes 26 seconds  Total Procedure Duration: 0 hours 21 minutes 11 seconds  Estimated Blood Loss:  Estimated blood loss: none.      [facility]

## 2020-04-27 ENCOUNTER — Encounter: Payer: Self-pay | Admitting: Gastroenterology

## 2020-04-27 ENCOUNTER — Telehealth: Payer: Self-pay | Admitting: Gastroenterology

## 2020-04-27 LAB — SURGICAL PATHOLOGY

## 2020-04-27 NOTE — Anesthesia Postprocedure Evaluation (Signed)
Anesthesia Post Note  Patient: Wilma Michaelson  Procedure(s) Performed: COLONOSCOPY WITH PROPOFOL (N/A ) ESOPHAGOGASTRODUODENOSCOPY (EGD) WITH PROPOFOL (N/A )  Patient location during evaluation: PACU Anesthesia Type: General Level of consciousness: awake and alert Pain management: pain level controlled Vital Signs Assessment: post-procedure vital signs reviewed and stable Respiratory status: spontaneous breathing, nonlabored ventilation, respiratory function stable and patient connected to nasal cannula oxygen Cardiovascular status: blood pressure returned to baseline and stable Postop Assessment: no apparent nausea or vomiting Anesthetic complications: no   No complications documented.   Last Vitals:  Vitals:   04/26/20 0837 04/26/20 0958  BP: (!) 146/80 (!) 105/54  Pulse: 80 (!) 112  Resp: 16 18  Temp: (!) 36.2 C 36.7 C  SpO2: 98% 99%    Last Pain:  Vitals:   04/27/20 0753  TempSrc:   PainSc: Doland Myran Arcia

## 2020-04-27 NOTE — Progress Notes (Signed)
Pt c/o pain and burning on urination. Talked with patient and daughter and advised to call DR Vicente Males and talk with him or seek help from clinic or emergency roo.

## 2020-04-27 NOTE — Telephone Encounter (Signed)
error 

## 2020-04-30 ENCOUNTER — Encounter: Payer: Self-pay | Admitting: Gastroenterology

## 2020-05-04 ENCOUNTER — Encounter: Payer: Self-pay | Admitting: Emergency Medicine

## 2020-05-04 ENCOUNTER — Ambulatory Visit
Admission: EM | Admit: 2020-05-04 | Discharge: 2020-05-04 | Disposition: A | Discharge status: Home / Self Care | Payer: Medicaid Other | Attending: Internal Medicine | Admitting: Internal Medicine

## 2020-05-04 ENCOUNTER — Other Ambulatory Visit: Payer: Self-pay

## 2020-05-04 DIAGNOSIS — N3001 Acute cystitis with hematuria: Secondary | ICD-10-CM | POA: Diagnosis not present

## 2020-05-04 LAB — URINALYSIS, COMPLETE (UACMP) WITH MICROSCOPIC
Bilirubin Urine: NEGATIVE
Glucose, UA: NEGATIVE mg/dL
Ketones, ur: NEGATIVE mg/dL
Nitrite: POSITIVE — AB
Protein, ur: NEGATIVE mg/dL
Specific Gravity, Urine: <1.005 — ABNORMAL LOW (ref 1.005–1.030)
WBC, UA: >50 WBC/hpf (ref 0–5)
pH: 6.5 (ref 5.0–8.0)

## 2020-05-04 MED ORDER — CEPHALEXIN 500 MG PO CAPS: 500.0000 mg | ORAL_CAPSULE | Freq: Two times a day (BID) | ORAL | 0 refills | Status: AC

## 2020-05-04 MED ORDER — FLUCONAZOLE 150 MG PO TABS: 150.0000 mg | ORAL_TABLET | Freq: Once | ORAL | 0 refills | Status: AC

## 2020-05-04 MED ORDER — PHENAZOPYRIDINE HCL 200 MG PO TABS: 200.0000 mg | ORAL_TABLET | Freq: Three times a day (TID) | ORAL | 0 refills | Status: DC | PRN

## 2020-05-04 NOTE — ED Triage Notes (Signed)
Pt c/o dysuria, lower abdominal and lower back pain. Started about a week ago. She has been taking Azo and cranberry juice.

## 2020-05-04 NOTE — ED Provider Notes (Signed)
[facility]    CSN: [id number] Arrival date & time: 05/04/20  1636      History   Chief Complaint Chief Complaint  Patient presents with  . Dysuria    HPI Wanda Reed is a 65 y.o. female comes to the urgent care with a 6-day history of dysuria, frequency of micturition and urgency.  Patient recently had a colonoscopy on 04/26/2020.  Symptoms have worsened over the course of 6 days.  She has lower back pain but denies any flank pain.  No nausea or vomiting.  No fever or chills.  Patient has tried Azo and cranberry juice with no improvement in his symptoms.  She comes to the urgent care to be evaluated further.   Patient also complains of whitish vaginal discharge.  Discharge has no odor to it.  No recent antibiotic use.  Patient is not sexually active.  No pruritus.  HPI  Past Medical History:  Diagnosis Date  . Anxiety   . Breast cancer ([place]) 07/2016   right breast lumpectomy with chemo and mammosite  . Breast cancer of upper-outer quadrant of right female breast ([place]) 08/2016   1.0 cm ER+, PR-, Her 2 neu not overexpressed. Node negative. T1b, N0.  [name] Cervical cancer ([place]) 1990  . Hypertension   . Hypothyroidism   . Personal history of chemotherapy 11/2016-12/2016   right breast ca  . Personal history of radiation therapy 09/15/2016   mammosite  . PONV (postoperative nausea and vomiting)   . Thyroid disease     Patient Active Problem List   Diagnosis Date Noted  . Mastitis, right, acute 01/25/2019  . Breast cancer ([place]) 02/12/2017  . Change of skin related to chemotherapy 01/06/2017  . CRP elevated 12/18/2016  . Elevated erythrocyte sedimentation rate 12/18/2016  . Vomiting and diarrhea 12/10/2016  . Aortitis ([place]) 11/27/2016  . Sepsis ([place]) 10/26/2016  . Hyperkalemia 08/28/2016  . Wide-complex tachycardia ([place]) 08/28/2016  . Malignant neoplasm of upper-outer quadrant of right breast in female, estrogen receptor positive ([place]) 08/18/2016     Past Surgical History:  Procedure Laterality Date  . ABDOMINAL HYSTERECTOMY    . BREAST BIOPSY Right 08/08/2016   invasive mammary carcinoma  . BREAST BIOPSY Right 06/01/2019   bx neg and abbcess drainage  . BREAST LUMPECTOMY Right 08/28/2016   invasive mammary carcinoma, clear margins, negative lymph nodes  . BREAST LUMPECTOMY WITH SENTINEL LYMPH NODE BIOPSY Right 08/28/2016   Procedure: BREAST LUMPECTOMY WITH SENTINEL LYMPH NODE BX;  Surgeon: [name], MD;  Location: [facility] ORS;  Service: General;  Laterality: Right;  . BREAST MAMMOSITE Right 09/15/2016   Procedure: MAMMOSITE BREAST;  Surgeon: [name], MD;  Location: [facility] ORS;  Service: General;  Laterality: Right;  . BREAST SURGERY Right    Breast Biopsy  . CERVIX SURGERY    . COLONOSCOPY  2008  . COLONOSCOPY WITH PROPOFOL N/A 04/26/2020   Procedure: COLONOSCOPY WITH PROPOFOL;  Surgeon: Jonathon Bellows, MD;  Location: [facility] ENDOSCOPY;  Service: Gastroenterology;  Laterality: N/A;  . ESOPHAGOGASTRODUODENOSCOPY (EGD) WITH PROPOFOL N/A 04/26/2020   Procedure: ESOPHAGOGASTRODUODENOSCOPY (EGD) WITH PROPOFOL;  Surgeon: Jonathon Bellows, MD;  Location: [facility] ENDOSCOPY;  Service: Gastroenterology;  Laterality: N/A;  . EYE SURGERY Bilateral    Cataract Extraction with IOL  . PORTA CATH INSERTION Right 11/20/2016   Procedure: PORTA CATH INSERTION;  Surgeon: [name], MD;  Location: [facility] ORS;  Service: General;  Laterality: Right;  . PORTA CATH REMOVAL  2018  .  RE-EXCISION OF BREAST LUMPECTOMY Right 07/14/2019   Procedure: RE-EXCISION OF Right BREAST LUMPECTOMY;  Surgeon: Jules Husbands, MD;  Location: [facility] ORS;  Service: General;  Laterality: Right;  . SHOULDER ARTHROSCOPY Right 2012   torn ligaments    OB History    Gravida  5   Para      Term      Preterm      AB      Living        SAB      IAB      Ectopic      Multiple      Live Births  5        Obstetric Comments  1st Menstrual Cycle:  12 1st  Pregnancy:  17         Home Medications    Prior to Admission medications   Medication Sig Start Date End Date Taking? Authorizing Provider  acyclovir (ZOVIRAX) 400 MG tablet Take 400 mg by mouth 3 (three) times daily as needed. 11/29/19  Yes [provider]  cephALEXin (KEFLEX) 500 MG capsule Take 1 capsule (500 mg total) by mouth 2 (two) times daily for 5 days. 05/04/20 05/09/20 Yes Victoriya Pol, Myrene Galas, MD  fluconazole (DIFLUCAN) 150 MG tablet Take 1 tablet (150 mg total) by mouth once for 1 dose. 05/04/20 05/04/20 Yes Malachai Schalk, Myrene Galas, MD  letrozole (FEMARA) 2.5 MG tablet TAKE 1 TABLET BY MOUTH ONCE DAILY. 04/16/20  Yes Lloyd Huger, MD  levothyroxine (SYNTHROID, LEVOTHROID) 100 MCG tablet Take 100 mcg by mouth daily before breakfast.   Yes [provider]  lisinopril (PRINIVIL,ZESTRIL) 10 MG tablet Take 10 mg by mouth daily.   Yes [provider]  phenazopyridine (PYRIDIUM) 200 MG tablet Take 1 tablet (200 mg total) by mouth 3 (three) times daily as needed for pain. 05/04/20  Yes Ronaldo Crilly, Myrene Galas, MD  MULTIPLE VITAMIN PO Take 1 tablet by mouth daily.    [provider]  mirabegron ER (MYRBETRIQ) 25 MG TB24 tablet Take 1 tablet (25 mg total) by mouth daily. Patient not taking: No sig reported 09/01/19 05/04/20  Nori Riis, PA-C    Family History Family History  Problem Relation Age of Onset  . Breast cancer Neg Hx     Social History Social History   Tobacco Use  . Smoking status: Never Smoker  . Smokeless tobacco: Never Used  Vaping Use  . Vaping Use: Never used  Substance Use Topics  . Alcohol use: No  . Drug use: No     Allergies   Patient has no known allergies.   Review of Systems Review of Systems  Constitutional: Negative for activity change, chills and fever.  Genitourinary: Positive for dysuria, frequency and vaginal discharge. Negative for urgency.  Neurological: Negative.  Negative for tremors, seizures,  weakness, light-headedness, numbness and headaches.     Physical Exam Triage Vital Signs ED Triage Vitals  Enc Vitals Group     BP 05/04/20 1659 (!) 147/70     Pulse Rate 05/04/20 1659 97     Resp 05/04/20 1659 18     Temp 05/04/20 1659 98.6 F (37 C)     Temp Source 05/04/20 1659 Oral     SpO2 05/04/20 1659 99 %     Weight 05/04/20 1656 141 lb 8.6 oz (64.2 kg)     Height 05/04/20 1656 5\' 1"  (1.549 m)     Head Circumference --      Peak  Flow --      Pain Score 05/04/20 1656 10     Pain Loc --      Pain Edu? --      Excl. in [place]? --    No data found.  Updated Vital Signs BP (!) 147/70 (BP Location: Left Arm)   Pulse 97   Temp 98.6 F (37 C) (Oral)   Resp 18   Ht 5\' 1"  (1.549 m)   Wt 64.2 kg   SpO2 99%   BMI 26.74 kg/m   Visual Acuity Right Eye Distance:   Left Eye Distance:   Bilateral Distance:    Right Eye Near:   Left Eye Near:    Bilateral Near:     Physical Exam Vitals and nursing note reviewed.  Constitutional:      General: She is in acute distress.     Appearance: She is ill-appearing.  Cardiovascular:     Rate and Rhythm: Normal rate and regular rhythm.  Pulmonary:     Effort: Pulmonary effort is normal.     Breath sounds: Normal breath sounds.  Abdominal:     General: Abdomen is flat. Bowel sounds are normal.     Palpations: There is no mass.     Tenderness: There is abdominal tenderness. There is no right CVA tenderness, left CVA tenderness, guarding or rebound.     Hernia: No hernia is present.  Musculoskeletal:        General: Normal range of motion.  Skin:    General: Skin is warm.     Capillary Refill: Capillary refill takes less than 2 seconds.  Neurological:     General: No focal deficit present.     Mental Status: She is alert and oriented to person, place, and time.      UC Treatments / Results  Labs (all labs ordered are listed, but only abnormal results are displayed) Labs Reviewed  URINALYSIS, COMPLETE (UACMP) WITH  MICROSCOPIC - Abnormal; Notable for the following components:      Result Value   APPearance HAZY (*)    Specific Gravity, Urine <1.005 (*)    Hgb urine dipstick MODERATE (*)    Nitrite POSITIVE (*)    Leukocytes,Ua LARGE (*)    Bacteria, UA MANY (*)    All other components within normal limits  URINE CULTURE    EKG   Radiology No results found.  Procedures Procedures (including critical care time)  Medications Ordered in UC Medications - No data to display  Initial Impression / Assessment and Plan / UC Course  I have reviewed the triage vital signs and the nursing notes.  Pertinent labs & imaging results that were available during my care of the patient were reviewed by me and considered in my medical decision making (see chart for details).     1.  Acute cystitis with hematuria: Point-of-care urine is significant for leukocyte esterase, nitrite, hemoglobin with many bacteria and WBC greater than 50 Urine cultures have been sent Keflex 500 mg twice daily for 5 days Pyridium Increase oral fluid intake If urine cultures require antibiotics to be changed we will call patient lab daily antibiotics  1.  Acute vaginitis likely vaginal yeast infection: Fluconazole 150 mg x 1 Return to urgent care if symptoms worsen. Final Clinical Impressions(s) / UC Diagnoses   Final diagnoses:  Acute cystitis with hematuria     Discharge Instructions     Increase oral fluid intake  Take medications as directed We will call you  with recommendations if labs are abnormal. Return to urgent care if symptoms worsen.   ED Prescriptions    Medication Sig Dispense Auth. Provider   cephALEXin (KEFLEX) 500 MG capsule Take 1 capsule (500 mg total) by mouth 2 (two) times daily for 5 days. 10 capsule Dominique Ressel, Myrene Galas, MD   phenazopyridine (PYRIDIUM) 200 MG tablet Take 1 tablet (200 mg total) by mouth 3 (three) times daily as needed for pain. 15 tablet Glenora Morocho, Myrene Galas, MD   fluconazole  (DIFLUCAN) 150 MG tablet Take 1 tablet (150 mg total) by mouth once for 1 dose. 1 tablet Kaliana Albino, Myrene Galas, MD     PDMP not reviewed this encounter.   Chase Picket, MD 05/04/20 [phone]

## 2020-05-04 NOTE — Discharge Instructions (Signed)
Increase oral fluid intake  Take medications as directed We will call you with recommendations if labs are abnormal. Return to urgent care if symptoms worsen.

## 2020-05-06 LAB — URINE CULTURE: Culture: NO GROWTH

## 2020-05-12 NOTE — Progress Notes (Signed)
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 High risk MammaPrint.  INTERVAL HISTORY: Patient returns to clinic today for routine 45-monthevaluation.  She currently feels well and is asymptomatic.  She continues to have occasional abdominal bloating.  She is tolerating letrozole well without significant side effects.  She has no neurologic complaints.  She has a good appetite and denies weight loss. She denies any chest pain, shortness of breath, cough, or hemoptysis.  She denies any nausea, vomiting, constipation, or diarrhea. She has no urinary complaints.  Patient offers no further specific complaints today.  REVIEW OF SYSTEMS:   Review of Systems  Constitutional: Negative.  Negative for fever, malaise/fatigue and weight loss.  Respiratory: Negative.  Negative for cough and shortness of breath.   Cardiovascular: Negative.  Negative for chest pain and leg swelling.  Gastrointestinal: Negative.  Negative for abdominal pain, nausea and vomiting.  Genitourinary: Negative.  Negative for dysuria.  Musculoskeletal: Negative.  Negative for back pain and joint pain.  Skin: Negative.  Negative for rash.  Neurological: Negative.  Negative for dizziness, focal weakness, weakness and headaches.  Psychiatric/Behavioral: Negative.  The patient is not nervous/anxious.     As per HPI. Otherwise, a complete review of systems is negative.  PAST MEDICAL HISTORY: Past Medical History:  Diagnosis Date  . Anxiety   . Breast cancer ([place] 07/[place]   right breast  lumpectomy with chemo and mammosite  . Breast cancer of upper-outer quadrant of right female breast ([place] 08/2016   1.0 cm ER+, PR-, Her 2 neu not overexpressed. Node negative. T1b, N0.  .[place] Kitchen[place]ervical cancer ([place] 1990  . Hypertension   . Hypothyroidism   . Personal history of chemotherapy 11/2016-12/2016   right breast ca  . Personal history of radiation therapy 09/15/2016   mammosite  . PONV (postoperative nausea and vomiting)   . Thyroid disease     PAST SURGICAL HISTORY: Past Surgical History:  Procedure Laterality Date  . ABDOMINAL HYSTERECTOMY    . BREAST BIOPSY Right 08/08/2016   invasive mammary carcinoma  . BREAST BIOPSY Right 06/01/2019   bx neg and abbcess drainage  . BREAST LUMPECTOMY Right 08/28/2016   invasive mammary carcinoma, clear margins, negative lymph nodes  . BREAST LUMPECTOMY WITH SENTINEL LYMPH NODE BIOPSY Right 08/28/2016   Procedure: BREAST LUMPECTOMY WITH SENTINEL LYMPH NODE BX;  Surgeon: BRobert Bellow MD;  Location: [facility] ORS;  Service: General;  Laterality: Right;  . BREAST MAMMOSITE Right 09/15/2016   Procedure: MAMMOSITE BREAST;  Surgeon: BRobert Bellow MD;  Location: [facility] ORS;  Service: General;  Laterality: Right;  . BREAST SURGERY Right    Breast Biopsy  . CERVIX SURGERY    . COLONOSCOPY  2008  . COLONOSCOPY WITH PROPOFOL N/A 04/26/2020   Procedure: COLONOSCOPY WITH PROPOFOL;  Surgeon: AJonathon Bellows MD;  [facility];  Service: Gastroenterology;  Laterality: N/A;  . ESOPHAGOGASTRODUODENOSCOPY (EGD) WITH PROPOFOL N/A 04/26/2020   Procedure: ESOPHAGOGASTRODUODENOSCOPY (EGD) WITH PROPOFOL;  Surgeon: AJonathon Bellows MD;  [facility];  Service: Gastroenterology;  Laterality: N/A;  . EYE SURGERY Bilateral  Cataract Extraction with IOL  . PORTA CATH INSERTION Right 11/20/2016   Procedure: PORTA CATH INSERTION;  Surgeon: [name], MD;  Location: [facility] ORS;  Service: General;  Laterality: Right;  . PORTA CATH REMOVAL   2018  . RE-EXCISION OF BREAST LUMPECTOMY Right 07/14/2019   Procedure: RE-EXCISION OF Right BREAST LUMPECTOMY;  Surgeon: Jules Husbands, MD;  Location: [facility] ORS;  Service: General;  Laterality: Right;  . SHOULDER ARTHROSCOPY Right 2012   torn ligaments    FAMILY HISTORY: Family History  Problem Relation Age of Onset  . Breast cancer Neg Hx     ADVANCED DIRECTIVES (Y/N):  N  HEALTH MAINTENANCE: Social History   Tobacco Use  . Smoking status: Never Smoker  . Smokeless tobacco: Never Used  Vaping Use  . Vaping Use: Never used  Substance Use Topics  . Alcohol use: No  . Drug use: No     Colonoscopy:  PAP:  Bone density:  Lipid panel:  No Known Allergies  Current Outpatient Medications  Medication Sig Dispense Refill  . acyclovir (ZOVIRAX) 400 MG tablet Take 400 mg by mouth 3 (three) times daily as needed.    [name] letrozole (FEMARA) 2.5 MG tablet TAKE 1 TABLET BY MOUTH ONCE DAILY. 90 tablet 3  . levothyroxine (SYNTHROID, LEVOTHROID) 100 MCG tablet Take 100 mcg by mouth daily before breakfast.    . lisinopril (PRINIVIL,ZESTRIL) 10 MG tablet Take 10 mg by mouth daily.    . MULTIPLE VITAMIN PO Take 1 tablet by mouth daily. (Patient not taking: Reported on 05/17/2020)    . phenazopyridine (PYRIDIUM) 200 MG tablet Take 1 tablet (200 mg total) by mouth 3 (three) times daily as needed for pain. (Patient not taking: Reported on 05/17/2020) 15 tablet 0   No current facility-administered medications for this visit.    OBJECTIVE: Vitals:   05/17/20 1022  BP: 138/61  Pulse: 85  Resp: 16  Temp: 97.7 F (36.5 C)  SpO2: 98%     Body mass index is 27.53 kg/m.    ECOG FS:0 - Asymptomatic  General: Well-developed, well-nourished, no acute distress. Eyes: Pink conjunctiva, anicteric sclera. HEENT: Normocephalic, moist mucous membranes. Lungs: No audible wheezing or coughing. Heart: Regular rate and rhythm. Abdomen: Soft, nontender, no obvious distention. Musculoskeletal: No edema,  cyanosis, or clubbing. Neuro: Alert, answering all questions appropriately. Cranial nerves grossly intact. Skin: No rashes or petechiae noted. Psych: Normal affect.   LAB RESULTS:  Lab Results  Component Value Date   NA 138 12/08/2019   K 4.3 12/08/2019   CL 100 12/08/2019   CO2 30 12/08/2019   GLUCOSE 105 (H) 12/08/2019   BUN 14 12/08/2019   CREATININE 0.75 12/08/2019   CALCIUM 9.1 12/08/2019   PROT 7.3 12/08/2019   ALBUMIN 3.7 12/08/2019   AST 27 12/08/2019   ALT 16 12/08/2019   ALKPHOS 100 12/08/2019   BILITOT 0.6 12/08/2019   GFRNONAA >60 12/08/2019   GFRAA >60 12/08/2019    Lab Results  Component Value Date   WBC 3.0 (L) 12/08/2019   NEUTROABS 1.8 12/08/2019   HGB 12.5 12/08/2019   HCT 37.0 12/08/2019   MCV 90.0 12/08/2019   PLT 150 12/08/2019     STUDIES: No results found.  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 High risk MammaPrint.  PLAN:    1. Pathologic stage IA ER positive, PR/HER-2 negative, invasive carcinoma of the upper outer quadrant of the  right breast:  Patient underwent lumpectomy on August 28, 2016 followed by MammoSite radiation therapy. Given her high risk MammaPrint, she also received adjuvant chemotherapy with Taxotere and Cytoxan completing cycle 4 of 4 on December 24, 2016.  Patient then abruptly transferred care to [facility] and was initiated on aromatase inhibitor.  Anastrozole was discontinued secondary to side effects.  She is now on letrozole and will complete 5 years of treatment in 2023.  Given her high risk MammaPrint may consider extending treatment.  Her most recent mammogram on December 20, 2019 was reported as BI-RADS 2.  Repeat in October 2022.  Return to clinic in 6 months for routine evaluation.  2.  Breast mass: Patient underwent recent lumpectomy with surgery and pathology was consistent with fat necrosis.  No active malignancy  was noted. 3.  Bone health: Patient's most recent bone mineral density on June 09, 2019 reported T score of -0.7 which is considered normal.  Repeat in March 2023. 4.  Joint pain: Patient does not complain of this today.  Continue symptomatic treatment with Tylenol. 5.  Abdominal pain/bloating: Recent colonoscopy and EGD revealed no obvious pathology.  Imaging was unrevealing.  Continue follow-up with primary care as scheduled.    Interpreter was present throughout the entire telemedicine visit.  Patient expressed understanding and was in agreement with this plan. She also understands that She can call clinic at any time with any questions, concerns, or complaints.   Cancer Staging Malignant neoplasm of upper-outer quadrant of right breast in female, estrogen receptor positive ([place]) Staging form: Breast, AJCC 8th Edition - Pathologic stage from 10/01/2016: Stage IA (pT1b, pN0, cM0, G2, ER: Positive, PR: Negative, HER2: Negative) - Signed by Lloyd Huger, MD on 10/01/2016 Neoadjuvant therapy: No Multigene prognostic tests performed: MammaPrint Histologic grading system: 3 grade system Laterality: Right   Lloyd Huger, MD   05/19/2020 6:58 AM

## 2020-05-17 ENCOUNTER — Inpatient Hospital Stay: Payer: Medicaid Other | Attending: Oncology | Admitting: Oncology

## 2020-05-17 ENCOUNTER — Other Ambulatory Visit: Payer: Self-pay

## 2020-05-17 ENCOUNTER — Ambulatory Visit
Admission: RE | Admit: 2020-05-17 | Discharge: 2020-05-17 | Disposition: A | Discharge status: Home / Self Care | Payer: Medicaid Other | Source: Ambulatory Visit | Attending: Radiation Oncology | Admitting: Radiation Oncology

## 2020-05-17 ENCOUNTER — Encounter: Payer: Self-pay | Admitting: Oncology

## 2020-05-17 VITALS — BP 138/61 | HR 85 | Temp 97.7°F | Resp 16 | Wt 145.7 lb

## 2020-05-17 DIAGNOSIS — Z170 Estrogen receptor positive status [ER+]: Secondary | ICD-10-CM | POA: Diagnosis not present

## 2020-05-17 DIAGNOSIS — Z79899 Other long term (current) drug therapy: Secondary | ICD-10-CM | POA: Diagnosis not present

## 2020-05-17 DIAGNOSIS — R140 Abdominal distension (gaseous): Secondary | ICD-10-CM | POA: Diagnosis not present

## 2020-05-17 DIAGNOSIS — E039 Hypothyroidism, unspecified: Secondary | ICD-10-CM | POA: Insufficient documentation

## 2020-05-17 DIAGNOSIS — Z923 Personal history of irradiation: Secondary | ICD-10-CM | POA: Insufficient documentation

## 2020-05-17 DIAGNOSIS — Z9221 Personal history of antineoplastic chemotherapy: Secondary | ICD-10-CM | POA: Insufficient documentation

## 2020-05-17 DIAGNOSIS — C50411 Malignant neoplasm of upper-outer quadrant of right female breast: Secondary | ICD-10-CM | POA: Diagnosis not present

## 2020-05-17 DIAGNOSIS — Z79811 Long term (current) use of aromatase inhibitors: Secondary | ICD-10-CM | POA: Insufficient documentation

## 2020-05-17 DIAGNOSIS — I10 Essential (primary) hypertension: Secondary | ICD-10-CM | POA: Diagnosis not present

## 2020-05-17 NOTE — Progress Notes (Signed)
Radiation Oncology Follow up Note  Name: Wanda Reed   Date:   05/17/2020 MRN:  [id number] DOB: 12-29-1955    This 65 y.o. female presents to the clinic today for 3 and half year follow-up status post accelerated partial breast radiation to her right breast for stage I ER/PR positive invasive mammary carcinoma.  REFERRING PROVIDER: Denton Lank, MD  HPI: Patient is a 65 year old Spanish-speaking female accompanied by interpreter seen today for 3-1/2-year follow-up status post accelerated partial breast radiation to her right breast for stage I ER/PR positive invasive mammary carcinoma.  Seen today in routine follow-up she is doing well she specifically denies breast tenderness cough or bone pain..  She had mammograms back in October which I have reviewed were BI-RADS 2 benign.  She is currently on Femara tolerating it well without side effect.  She is recently treated for acute cystitis with hematuria as well as acute vaginitis likely vaginal yeast infection.  She is also being followed by GI for cirrhosis of the liver.  COMPLICATIONS OF TREATMENT: none  FOLLOW UP COMPLIANCE: keeps appointments   PHYSICAL EXAM:  There were no vitals taken for this visit. Lungs are clear to A&P cardiac examination essentially unremarkable with regular rate and rhythm. No dominant mass or nodularity is noted in either breast in 2 positions examined. Incision is well-healed. No axillary or supraclavicular adenopathy is appreciated. Cosmetic result is excellent.  Well-developed well-nourished patient in NAD. HEENT reveals PERLA, EOMI, discs not visualized.  Oral cavity is clear. No oral mucosal lesions are identified. Neck is clear without evidence of cervical or supraclavicular adenopathy. Lungs are clear to A&P. Cardiac examination is essentially unremarkable with regular rate and rhythm without murmur rub or thrill. Abdomen is benign with no organomegaly or masses noted. Motor sensory and DTR levels are  equal and symmetric in the upper and lower extremities. Cranial nerves II through XII are grossly intact. Proprioception is intact. No peripheral adenopathy or edema is identified. No motor or sensory levels are noted. Crude visual fields are within normal range.  RADIOLOGY RESULTS: Mammogram and ultrasound of the abdomen reviewed compatible with above-stated findings  PLAN: Present time patient is doing well from a breast standpoint now out 3-1/2 years.  I am pleased with her overall progress.  She continues on Femara without side effect.  I have asked to see her back in 1 year and then will discontinue follow-up care.  Patient comprehends my recommendations well.  I would like to take this opportunity to thank you for allowing me to participate in the care of your patient.Noreene Filbert, MD

## 2020-05-17 NOTE — Progress Notes (Signed)
Patient here for oncology follow-up appointment, expresses  concerns of bloating

## 2020-05-28 ENCOUNTER — Other Ambulatory Visit: Payer: Self-pay

## 2020-05-28 ENCOUNTER — Encounter: Payer: Self-pay | Admitting: Surgery

## 2020-05-28 ENCOUNTER — Ambulatory Visit (INDEPENDENT_AMBULATORY_CARE_PROVIDER_SITE_OTHER): Payer: Medicaid Other | Admitting: Surgery

## 2020-05-28 VITALS — BP 112/60 | HR 81 | Temp 98.4°F | Ht 60.0 in | Wt 143.8 lb

## 2020-05-28 DIAGNOSIS — N3001 Acute cystitis with hematuria: Secondary | ICD-10-CM

## 2020-05-28 NOTE — Patient Instructions (Signed)
Referrals have been sent to both Urology and Gynecology. Someone from their offices will call to schedulel the appointments. If you do not hear from their office in one week please call our office and let us know.  We will reach out to you in September 2022 to schedule your mammogram for October 2022. If you do not hear from our office by the end of September please call and let us know so we can get the mammogram and follow up appointment scheduled.

## 2020-05-28 NOTE — Progress Notes (Signed)
Outpatient Surgical Follow Up  05/28/2020  Wanda Reed Allayne Butcher is an 65 y.o. female.   Chief Complaint  Patient presents with  . Follow-up    Gallbladder-    HPI: 65 year old female well-known to me with history of right breast cancer status post lumpectomy and MammoSite radiation in 2018. 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 She recovered very well from this and was very happy with the results after the operation as her preoperatively symptoms resolved completely.  She has intermittent subacute right upper quadrant pain and lower abdominal pain that is mild to moderate intensity and colicky type She also feels significant burning with urination, hesitancy and a pressure type symptoms within the pelvis and vaginal canal.  .  No fevers no chills no evidence of biliary obstruction.  I did order an ultrasound that I have personally reviewed showing no evidence of gallstones.  Normal common bile duct for her age, some evidence of cirrhosis. Her HIDA scan was normal low end EF  Past Medical History:  Diagnosis Date  . Anxiety   . Breast cancer ([place]) 07/2016   right breast lumpectomy with chemo and mammosite  . Breast cancer of upper-outer quadrant of right female breast ([place]) 08/2016   1.0 cm ER+, PR-, Her 2 neu not overexpressed. Node negative. T1b, N0.  [name] Cervical cancer ([place]) 1990  . Hypertension   . Hypothyroidism   . Personal history of chemotherapy 11/2016-12/2016   right breast ca  . Personal history of radiation therapy 09/15/2016   mammosite  . PONV (postoperative nausea and vomiting)   . Thyroid disease     Past Surgical History:  Procedure Laterality Date  . ABDOMINAL HYSTERECTOMY    . BREAST BIOPSY Right 08/08/2016   invasive mammary carcinoma  . BREAST BIOPSY Right 06/01/2019   bx neg and abbcess drainage  . BREAST LUMPECTOMY Right 08/28/2016   invasive mammary carcinoma, clear margins, negative lymph nodes   . BREAST LUMPECTOMY WITH SENTINEL LYMPH NODE BIOPSY Right 08/28/2016   Procedure: BREAST LUMPECTOMY WITH SENTINEL LYMPH NODE BX;  Surgeon: [name], MD;  Location: [facility] ORS;  Service: General;  Laterality: Right;  . BREAST MAMMOSITE Right 09/15/2016   Procedure: MAMMOSITE BREAST;  Surgeon: [name], MD;  Location: [facility] ORS;  Service: General;  Laterality: Right;  . BREAST SURGERY Right    Breast Biopsy  . CERVIX SURGERY    . COLONOSCOPY  2008  . COLONOSCOPY WITH PROPOFOL N/A 04/26/2020   Procedure: COLONOSCOPY WITH PROPOFOL;  Surgeon: Jonathon Bellows, MD;  Location: [facility] ENDOSCOPY;  Service: Gastroenterology;  Laterality: N/A;  . ESOPHAGOGASTRODUODENOSCOPY (EGD) WITH PROPOFOL N/A 04/26/2020   Procedure: ESOPHAGOGASTRODUODENOSCOPY (EGD) WITH PROPOFOL;  Surgeon: Jonathon Bellows, MD;  Location: [facility] ENDOSCOPY;  Service: Gastroenterology;  Laterality: N/A;  . EYE SURGERY Bilateral    Cataract Extraction with IOL  . PORTA CATH INSERTION Right 11/20/2016   Procedure: PORTA CATH INSERTION;  Surgeon: [name], MD;  Location: [facility] ORS;  Service: General;  Laterality: Right;  . PORTA CATH REMOVAL  2018  . RE-EXCISION OF BREAST LUMPECTOMY Right 07/14/2019   Procedure: RE-EXCISION OF Right BREAST LUMPECTOMY;  Surgeon: Jules Husbands, MD;  Location: [facility] ORS;  Service: General;  Laterality: Right;  . SHOULDER ARTHROSCOPY Right 2012   torn ligaments    Family History  Problem Relation Age of Onset  . Breast cancer Neg Hx     Social History:  reports that she  has never smoked. She has never used smokeless tobacco. She reports that she does not drink alcohol and does not use drugs.  Allergies: No Known Allergies  Medications reviewed.    ROS Full ROS performed and is otherwise negative other than what is stated in HPI   BP 112/60   Pulse 81   Temp 98.4 F (36.9 C) (Oral)   Ht 5' (1.524 m)   Wt 143 lb 12.8 oz (65.2 kg)   SpO2 98%   BMI 28.08 kg/m   Physical  Exam Vitals and nursing note reviewed. Exam conducted with a chaperone present.  Constitutional:      General: She is not in acute distress.    Appearance: Normal appearance. She is normal weight.  Cardiovascular:     Rate and Rhythm: Normal rate and regular rhythm.     Heart sounds: No murmur heard.   Pulmonary:     Effort: Pulmonary effort is normal. No respiratory distress.     Breath sounds: Normal breath sounds. No stridor.  Abdominal:     General: Abdomen is flat. There is no distension.     Palpations: Abdomen is soft. There is no mass.     Tenderness: There is no abdominal tenderness. There is no guarding or rebound.     Hernia: No hernia is present.  Musculoskeletal:     Cervical back: Normal range of motion and neck supple. No rigidity or tenderness.  Skin:    General: Skin is warm and dry.     Capillary Refill: Capillary refill takes less than 2 seconds.  Neurological:     General: No focal deficit present.     Mental Status: She is alert and oriented to person, place, and time.  Psychiatric:        Mood and Affect: Mood normal.        Behavior: Behavior normal.        Thought Content: Thought content normal.        Judgment: Judgment normal.      Assessment/Plan: 65 year old female with lower abdominal pain and recurrent UTIs with cystitis and hematuria.  Discussed with patient in detail and she wants.  Shared she gets a follow-up appointment with urology. She also would like to have a gynecological exam and we will refer her to GYN. From general surgery at some point no issues at this time we will see her back in about 7 months after she completes her mammogram. F/U w Dr. Vicente Males for mild cirrhosis.   Greater than 50% of the 25 minutes  visit was spent in counseling/coordination of care   [name], MD [place] Surgeon

## 2020-05-29 ENCOUNTER — Ambulatory Visit
Admission: EM | Admit: 2020-05-29 | Discharge: 2020-05-29 | Disposition: A | Discharge status: Home / Self Care | Payer: Medicaid Other | Attending: Physician Assistant | Admitting: Physician Assistant

## 2020-05-29 ENCOUNTER — Other Ambulatory Visit: Payer: Self-pay

## 2020-05-29 ENCOUNTER — Encounter: Payer: Self-pay | Admitting: Emergency Medicine

## 2020-05-29 DIAGNOSIS — R1030 Lower abdominal pain, unspecified: Secondary | ICD-10-CM | POA: Diagnosis not present

## 2020-05-29 DIAGNOSIS — N390 Urinary tract infection, site not specified: Secondary | ICD-10-CM | POA: Diagnosis not present

## 2020-05-29 DIAGNOSIS — R300 Dysuria: Secondary | ICD-10-CM | POA: Insufficient documentation

## 2020-05-29 LAB — URINALYSIS, COMPLETE (UACMP) WITH MICROSCOPIC
Bilirubin Urine: NEGATIVE
Glucose, UA: NEGATIVE mg/dL
Hgb urine dipstick: NEGATIVE
Ketones, ur: NEGATIVE mg/dL
Nitrite: NEGATIVE
Protein, ur: NEGATIVE mg/dL
Specific Gravity, Urine: 1.01 (ref 1.005–1.030)
pH: 6.5 (ref 5.0–8.0)

## 2020-05-29 MED ORDER — AMOXICILLIN-POT CLAVULANATE 875-125 MG PO TABS: 1.0000 | ORAL_TABLET | Freq: Two times a day (BID) | ORAL | 0 refills | Status: AC

## 2020-05-29 MED ORDER — FLUCONAZOLE 150 MG PO TABS: ORAL_TABLET | ORAL | 0 refills | Status: DC

## 2020-05-29 NOTE — ED Provider Notes (Signed)
[facility]    CSN: [id number] Arrival date & time: 05/29/20  1759      History   Chief Complaint Chief Complaint  Patient presents with  . Dysuria    HPI Wanda Reed is a 65 y.o. female presenting with her daughter for 2-day history of painful urination, urinary frequency and urgency as well as suprapubic pain/pressure.  She also says she has lower back pain which is worse on the right side.  Denies any associated fever, fatigue, body aches, nausea/vomiting/diarrhea or constipation.  Patient denies any vaginal discharge, but says everything feels irritated and she believes it is because she is going to the bathroom so frequently and wiping herself often. Patient was seen at [facility] last month for the same symptoms and had an abnormal UA indicating likely UTI. She took 5 day course of Keflex and says that symptoms resolved until they returned 2 days ago. No other concerns.   Patient's daughter is helping to translate since patient is Spanish speaking.  HPI  Past Medical History:  Diagnosis Date  . Anxiety   . Breast cancer ([place]) 07/2016   right breast lumpectomy with chemo and mammosite  . Breast cancer of upper-outer quadrant of right female breast ([place]) 08/2016   1.0 cm ER+, PR-, Her 2 neu not overexpressed. Node negative. T1b, N0.  [name] Cervical cancer ([place]) 1990  . Hypertension   . Hypothyroidism   . Personal history of chemotherapy 11/2016-12/2016   right breast ca  . Personal history of radiation therapy 09/15/2016   mammosite  . PONV (postoperative nausea and vomiting)   . Thyroid disease     Patient Active Problem List   Diagnosis Date Noted  . Mastitis, right, acute 01/25/2019  . Breast cancer ([place]) 02/12/2017  . Change of skin related to chemotherapy 01/06/2017  . CRP elevated 12/18/2016  . Elevated erythrocyte sedimentation rate 12/18/2016  . Vomiting and diarrhea 12/10/2016  . Aortitis ([place]) 11/27/2016  . Sepsis ([place]) 10/26/2016  .  Hyperkalemia 08/28/2016  . Wide-complex tachycardia ([place]) 08/28/2016  . Malignant neoplasm of upper-outer quadrant of right breast in female, estrogen receptor positive ([place]) 08/18/2016    Past Surgical History:  Procedure Laterality Date  . ABDOMINAL HYSTERECTOMY    . BREAST BIOPSY Right 08/08/2016   invasive mammary carcinoma  . BREAST BIOPSY Right 06/01/2019   bx neg and abbcess drainage  . BREAST LUMPECTOMY Right 08/28/2016   invasive mammary carcinoma, clear margins, negative lymph nodes  . BREAST LUMPECTOMY WITH SENTINEL LYMPH NODE BIOPSY Right 08/28/2016   Procedure: BREAST LUMPECTOMY WITH SENTINEL LYMPH NODE BX;  Surgeon: [name], MD;  Location: [facility] ORS;  Service: General;  Laterality: Right;  . BREAST MAMMOSITE Right 09/15/2016   Procedure: MAMMOSITE BREAST;  Surgeon: [name], MD;  Location: [facility] ORS;  Service: General;  Laterality: Right;  . BREAST SURGERY Right    Breast Biopsy  . CERVIX SURGERY    . COLONOSCOPY  2008  . COLONOSCOPY WITH PROPOFOL N/A 04/26/2020   Procedure: COLONOSCOPY WITH PROPOFOL;  Surgeon: Jonathon Bellows, MD;  Location: [facility] ENDOSCOPY;  Service: Gastroenterology;  Laterality: N/A;  . ESOPHAGOGASTRODUODENOSCOPY (EGD) WITH PROPOFOL N/A 04/26/2020   Procedure: ESOPHAGOGASTRODUODENOSCOPY (EGD) WITH PROPOFOL;  Surgeon: Jonathon Bellows, MD;  Location: [facility] ENDOSCOPY;  Service: Gastroenterology;  Laterality: N/A;  . EYE SURGERY Bilateral    Cataract Extraction with IOL  . PORTA CATH INSERTION Right 11/20/2016   Procedure: PORTA CATH INSERTION;  Surgeon: [name], MD;  Location: [facility] ORS;  Service: General;  Laterality: Right;  . PORTA CATH REMOVAL  2018  . RE-EXCISION OF BREAST LUMPECTOMY Right 07/14/2019   Procedure: RE-EXCISION OF Right BREAST LUMPECTOMY;  Surgeon: Jules Husbands, MD;  Location: [facility] ORS;  Service: General;  Laterality: Right;  . SHOULDER ARTHROSCOPY Right 2012   torn ligaments    OB History    Gravida  5   Para       Term      Preterm      AB      Living        SAB      IAB      Ectopic      Multiple      Live Births  5        Obstetric Comments  1st Menstrual Cycle:  12 1st Pregnancy:  17         Home Medications    Prior to Admission medications   Medication Sig Start Date End Date Taking? Authorizing Provider  amoxicillin-clavulanate (AUGMENTIN) 875-125 MG tablet Take 1 tablet by mouth every 12 (twelve) hours for 7 days. 05/29/20 06/05/20 Yes Danton Clap, PA-C  acyclovir (ZOVIRAX) 400 MG tablet Take 400 mg by mouth 3 (three) times daily as needed. 11/29/19   [provider]  letrozole (FEMARA) 2.5 MG tablet TAKE 1 TABLET BY MOUTH ONCE DAILY. 04/16/20   Lloyd Huger, MD  levothyroxine (SYNTHROID, LEVOTHROID) 100 MCG tablet Take 100 mcg by mouth daily before breakfast.    [provider]  lisinopril (PRINIVIL,ZESTRIL) 10 MG tablet Take 10 mg by mouth daily.    [provider]  MULTIPLE VITAMIN PO Take 1 tablet by mouth daily.    [provider]  phenazopyridine (PYRIDIUM) 200 MG tablet Take 1 tablet (200 mg total) by mouth 3 (three) times daily as needed for pain. 05/04/20   LampteyMyrene Galas, MD  mirabegron ER (MYRBETRIQ) 25 MG TB24 tablet Take 1 tablet (25 mg total) by mouth daily. Patient not taking: No sig reported 09/01/19 05/04/20  Nori Riis, PA-C    Family History Family History  Problem Relation Age of Onset  . Breast cancer Neg Hx     Social History Social History   Tobacco Use  . Smoking status: Never Smoker  . Smokeless tobacco: Never Used  Vaping Use  . Vaping Use: Never used  Substance Use Topics  . Alcohol use: No  . Drug use: No     Allergies   Patient has no known allergies.   Review of Systems Review of Systems  Constitutional: Negative for chills and fever.  Gastrointestinal: Positive for abdominal pain. Negative for diarrhea, nausea and vomiting.  Genitourinary: Positive for dysuria,  frequency and urgency. Negative for decreased urine volume, flank pain, hematuria, pelvic pain, vaginal bleeding, vaginal discharge and vaginal pain.  Musculoskeletal: Positive for back pain.  Skin: Negative for rash.     Physical Exam Triage Vital Signs ED Triage Vitals  Enc Vitals Group     BP 05/29/20 1820 (!) 147/60     Pulse Rate 05/29/20 1820 74     Resp 05/29/20 1820 17     Temp 05/29/20 1820 98.2 F (36.8 C)     Temp Source 05/29/20 1820 Oral     SpO2 05/29/20 1820 100 %     Weight --      Height --      Head Circumference --  Peak Flow --      Pain Score 05/29/20 1818 8     Pain Loc --      Pain Edu? --      Excl. in [place]? --    No data found.  Updated Vital Signs BP (!) 147/60 (BP Location: Left Arm)   Pulse 74   Temp 98.2 F (36.8 C) (Oral)   Resp 17   SpO2 100%    Physical Exam Vitals and nursing note reviewed.  Constitutional:      General: She is not in acute distress.    Appearance: Normal appearance. She is not ill-appearing or toxic-appearing.  HENT:     Head: Normocephalic and atraumatic.  Eyes:     General: No scleral icterus.       Right eye: No discharge.        Left eye: No discharge.     Conjunctiva/sclera: Conjunctivae normal.  Cardiovascular:     Rate and Rhythm: Normal rate and regular rhythm.     Heart sounds: Normal heart sounds.  Pulmonary:     Effort: Pulmonary effort is normal. No respiratory distress.     Breath sounds: Normal breath sounds.  Abdominal:     Palpations: Abdomen is soft.     Tenderness: There is abdominal tenderness (suprapubic TTP). There is right CVA tenderness. There is no left CVA tenderness.  Musculoskeletal:     Cervical back: Neck supple.  Skin:    General: Skin is dry.  Neurological:     General: No focal deficit present.     Mental Status: She is alert. Mental status is at baseline.     Motor: No weakness.     Gait: Gait normal.  Psychiatric:        Mood and Affect: Mood normal.         Behavior: Behavior normal.        Thought Content: Thought content normal.      UC Treatments / Results  Labs (all labs ordered are listed, but only abnormal results are displayed) Labs Reviewed  URINALYSIS, COMPLETE (UACMP) WITH MICROSCOPIC - Abnormal; Notable for the following components:      Result Value   Leukocytes,Ua SMALL (*)    Bacteria, UA FEW (*)    All other components within normal limits  URINE CULTURE    EKG   Radiology No results found.  Procedures Procedures (including critical care time)  Medications Ordered in UC Medications - No data to display  Initial Impression / Assessment and Plan / UC Course  I have reviewed the triage vital signs and the nursing notes.  Pertinent labs & imaging results that were available during my care of the patient were reviewed by me and considered in my medical decision making (see chart for details).   65 year old female presenting for 2-day history of painful urination, urinary frequency and urgency as well as suprapubic pressure and lower back pain.  Vital signs are all stable.  She is afebrile.  Exam significant for suprapubic tenderness.  She does exhibit some right CVA tenderness.  Chest is clear to auscultation heart regular rate and rhythm.  UA shows small leukocytes.  We will culture urine.  We will treat for UTI given her symptoms and the leukocytes on UA.  Since she does have some right CVA tenderness, concern for possible ascending infection.  Treating with Augmentin at this time.  Advised increased rest and fluids.  Advised patient will call in a couple days with the  results of the urine culture.  Therapy may need to be altered.  ED precautions and red flag signs and symptoms reviewed with patient.  Advised to keep follow-up appointment with urology as scheduled as well.  Final Clinical Impressions(s) / UC Diagnoses   Final diagnoses:  Urinary tract infection without hematuria, site unspecified  Dysuria  Lower  abdominal pain     Discharge Instructions     UTI: Based on either symptoms or urinalysis, you may have a urinary tract infection. We will send the urine for culture and call with results in a few days. Begin antibiotics at this time. Your symptoms should be much improved over the next 2-3 days. Increase rest and fluid intake. If for some reason symptoms are worsening or not improving after a couple of days or the urine culture determines the antibiotics you are taking will not treat the infection, the antibiotics may be changed. Return or go to ER for fever, back pain, worsening urinary pain, discharge, increased blood in urine. May take Tylenol or Motrin OTC for pain relief or consider AZO if no contraindications     ED Prescriptions    Medication Sig Dispense Auth. Provider   amoxicillin-clavulanate (AUGMENTIN) 875-125 MG tablet Take 1 tablet by mouth every 12 (twelve) hours for 7 days. 14 tablet Gretta Cool     PDMP not reviewed this encounter.   Danton Clap, PA-C 05/29/20 1859

## 2020-05-29 NOTE — Discharge Instructions (Addendum)

## 2020-05-29 NOTE — ED Triage Notes (Signed)
Pt states that she has dysuria, urgency, abdominal pain, back pain, and frequency. Pt states that her sx started Sunday. Pt states that she took AZO today around 1pm

## 2020-05-31 LAB — URINE CULTURE: Culture: <10000 — AB

## 2020-06-12 ENCOUNTER — Other Ambulatory Visit: Payer: Self-pay

## 2020-06-12 ENCOUNTER — Encounter: Payer: Self-pay | Admitting: Obstetrics and Gynecology

## 2020-06-12 ENCOUNTER — Other Ambulatory Visit (HOSPITAL_COMMUNITY)
Admission: RE | Admit: 2020-06-12 | Discharge: 2020-06-12 | Disposition: A | Discharge status: Home / Self Care | Payer: Medicaid Other | Source: Ambulatory Visit | Attending: Obstetrics and Gynecology | Admitting: Obstetrics and Gynecology

## 2020-06-12 ENCOUNTER — Ambulatory Visit (INDEPENDENT_AMBULATORY_CARE_PROVIDER_SITE_OTHER): Payer: Medicaid Other | Admitting: Obstetrics and Gynecology

## 2020-06-12 VITALS — BP 122/74 | Ht 61.0 in | Wt 143.0 lb

## 2020-06-12 DIAGNOSIS — Z8541 Personal history of malignant neoplasm of cervix uteri: Secondary | ICD-10-CM | POA: Insufficient documentation

## 2020-06-12 DIAGNOSIS — Z01419 Encounter for gynecological examination (general) (routine) without abnormal findings: Secondary | ICD-10-CM

## 2020-06-12 NOTE — Patient Instructions (Signed)
For the topical cream (clotrimazole) * apply twice daily for 1 week * apply daily for 2 weeks * apply three times each week for 2 weeks (Monday, Wednesday, Friday) * stop taking the medication at this point   For lubrication: KY Gel Astroglide  For dryness: Coconut oil Replens

## 2020-06-12 NOTE — Progress Notes (Signed)
Obstetrics & Gynecology Office Visit   Chief Complaint  Patient presents with  . Referral  Dr. [name] from [facility] for pap smear  The patient is seen in referral at the request of [place], [place], MD from [facility] for a pap smear.   History of Present Illness: 65 y.o. G109P0 female who is seen in referral from [place], [place], MD from [facility] for a pap smear.  She states that some time has passed since her last pap smear, she thinks about two years.  She states this one was normal.  She states this was done at [facility].     She has a history of breast cancer (right breast, ER+, PR negative, Her 2 neu not overexpressed).  She is taking letrozole for this.   She states she has had a hysterectomy, she states it was done greater than 20 years ago. She states that this was due to cervical cancer. She states that she did not undergo radiation or chemotherapy for this. She states this was done at [facility].   She denies vaginal bleeding.  She notes vaginal dryness and burning.  She has been given a cream for her burning and dryness.  She has been using this three times a day for the past week. When looking at her chart I can see a prescription for clotrimazole 1% prescribed in March of this year from [facility].  She states that the cream is not working very well.  She has an appointment with Urology tomorrow for a history of recurrent UTIs with hematuria.       Past Medical History:  Diagnosis Date  . Anxiety   . Breast cancer ([place]) 07/2016   right breast lumpectomy with chemo and mammosite  . Breast cancer of upper-outer quadrant of right female breast ([place]) 08/2016   1.0 cm ER+, PR-, Her 2 neu not overexpressed. Node negative. T1b, N0.  [name] Cervical cancer ([place]) 1990  . Hypertension   . Hypothyroidism   . Personal history of chemotherapy 11/2016-12/2016   right breast ca  . Personal history of radiation therapy 09/15/2016    mammosite  . PONV (postoperative nausea and vomiting)   . Thyroid disease     Past Surgical History:  Procedure Laterality Date  . ABDOMINAL HYSTERECTOMY    . BREAST BIOPSY Right 08/08/2016   invasive mammary carcinoma  . BREAST BIOPSY Right 06/01/2019   bx neg and abbcess drainage  . BREAST LUMPECTOMY Right 08/28/2016   invasive mammary carcinoma, clear margins, negative lymph nodes  . BREAST LUMPECTOMY WITH SENTINEL LYMPH NODE BIOPSY Right 08/28/2016   Procedure: BREAST LUMPECTOMY WITH SENTINEL LYMPH NODE BX;  Surgeon: [name], MD;  Location: [facility] ORS;  Service: General;  Laterality: Right;  . BREAST MAMMOSITE Right 09/15/2016   Procedure: MAMMOSITE BREAST;  Surgeon: [name], MD;  Location: [facility] ORS;  Service: General;  Laterality: Right;  . BREAST SURGERY Right    Breast Biopsy  . CERVIX SURGERY    . COLONOSCOPY  2008  . COLONOSCOPY WITH PROPOFOL N/A 04/26/2020   Procedure: COLONOSCOPY WITH PROPOFOL;  Surgeon: Jonathon Bellows, MD;  Location: [facility] ENDOSCOPY;  Service: Gastroenterology;  Laterality: N/A;  . ESOPHAGOGASTRODUODENOSCOPY (EGD) WITH PROPOFOL N/A 04/26/2020   Procedure: ESOPHAGOGASTRODUODENOSCOPY (EGD) WITH PROPOFOL;  Surgeon: Jonathon Bellows, MD;  Location: [facility] ENDOSCOPY;  Service: Gastroenterology;  Laterality: N/A;  . EYE SURGERY Bilateral    Cataract Extraction with IOL  . PORTA CATH  INSERTION Right 11/20/2016   Procedure: PORTA CATH INSERTION;  Surgeon: [name], MD;  Location: [facility] ORS;  Service: General;  Laterality: Right;  . PORTA CATH REMOVAL  2018  . RE-EXCISION OF BREAST LUMPECTOMY Right 07/14/2019   Procedure: RE-EXCISION OF Right BREAST LUMPECTOMY;  Surgeon: Jules Husbands, MD;  Location: [facility] ORS;  Service: General;  Laterality: Right;  . SHOULDER ARTHROSCOPY Right 2012   torn ligaments    Gynecologic History: No LMP recorded. Patient has had a hysterectomy.  Obstetric History: G5P0  Family History  Problem Relation Age of Onset   . Breast cancer Neg Hx     Social History   Socioeconomic History  . Marital status: Married    Spouse name: Not on file  . Number of children: Not on file  . Years of education: Not on file  . Highest education level: Not on file  Occupational History  . Not on file  Tobacco Use  . Smoking status: Never Smoker  . Smokeless tobacco: Never Used  Vaping Use  . Vaping Use: Never used  Substance and Sexual Activity  . Alcohol use: No  . Drug use: No  . Sexual activity: Yes    Birth control/protection: Post-menopausal  Other Topics Concern  . Not on file  Social History Narrative  . Not on file   Social Determinants of Health   Financial Resource Strain: Not on file  Food Insecurity: Not on file  Transportation Needs: Not on file  Physical Activity: Not on file  Stress: Not on file  Social Connections: Not on file  Intimate Partner Violence: Not on file    No Known Allergies  Prior to Admission medications   Medication Sig Start Date End Date Taking? Authorizing Provider  letrozole (FEMARA) 2.5 MG tablet TAKE 1 TABLET BY MOUTH ONCE DAILY. 04/16/20  Yes Lloyd Huger, MD  levothyroxine (SYNTHROID, LEVOTHROID) 100 MCG tablet Take 100 mcg by mouth daily before breakfast.   Yes [provider]  lisinopril (PRINIVIL,ZESTRIL) 10 MG tablet Take 10 mg by mouth daily.   Yes [provider]    Review of Systems  Constitutional: Negative.   HENT: Negative.   Eyes: Negative.   Respiratory: Negative.   Cardiovascular: Negative.   Gastrointestinal: Negative.   Genitourinary: Negative.   Musculoskeletal: Negative.   Skin: Negative.   Neurological: Negative.   Psychiatric/Behavioral: Negative.      Physical Exam BP 122/74   Ht 5\' 1"  (1.549 m)   Wt 143 lb (64.9 kg)   BMI 27.02 kg/m  No LMP recorded. Patient has had a hysterectomy. Physical Exam Constitutional:      General: She is not in acute distress.    Appearance: Normal appearance. She is  well-developed.  Genitourinary:     Vulva, bladder and urethral meatus normal.     No lesions in the vagina.     Right Labia: No rash, tenderness, lesions, skin changes or Bartholin's cyst.    Left Labia: No tenderness, skin changes, Bartholin's cyst or rash.    Vulva exam comments: Mild bilateral edema of the labia majora bilaterally.     No inguinal adenopathy present in the right or left side.    Pelvic Tanner Score: 5/5.    Vaginal cuff intact.    Vaginal tenderness present.     No vaginal discharge, erythema, bleeding, ulceration or cuff induration.     No vaginal prolapse present.    Moderate vaginal atrophy present.  Right Adnexa: tender.    Right Adnexa: not palpable.    Left Adnexa: not palpable.    Cervix is absent.     Uterus is absent.     Urethral tenderness present.     No urethral mass present.     Pelvic exam was performed with patient in the lithotomy position.  HENT:     Head: Normocephalic and atraumatic.  Eyes:     General: No scleral icterus.    Conjunctiva/sclera: Conjunctivae normal.  Cardiovascular:     Rate and Rhythm: Normal rate and regular rhythm.     Heart sounds: No murmur heard. No friction rub. No gallop.   Pulmonary:     Effort: Pulmonary effort is normal. No respiratory distress.     Breath sounds: Normal breath sounds. No wheezing or rales.  Abdominal:     General: Bowel sounds are normal. There is no distension.     Palpations: Abdomen is soft. There is no mass.     Tenderness: There is abdominal tenderness (RLQ, mild). There is no guarding or rebound.     Hernia: There is no hernia in the left inguinal area or right inguinal area.  Musculoskeletal:        General: Normal range of motion.     Cervical back: Normal range of motion and neck supple.  Lymphadenopathy:     Lower Body: No right inguinal adenopathy. No left inguinal adenopathy.  Neurological:     General: No focal deficit present.     Mental Status: She is alert and  oriented to person, place, and time.     Cranial Nerves: No cranial nerve deficit.  Skin:    General: Skin is warm and dry.     Findings: No erythema.  Psychiatric:        Mood and Affect: Mood normal.        Behavior: Behavior normal.        Judgment: Judgment normal.     Female chaperone present for pelvic and breast  portions of the physical exam  Assessment: 65 y.o. G69P0 female here for  1. Women's annual routine gynecological examination   2. History of cervical cancer      Plan: Problem List Items Addressed This Visit   None   Visit Diagnoses    Women's annual routine gynecological examination    -  Primary   Relevant Orders   Cytology - PAP   History of cervical cancer       Relevant Orders   Cytology - PAP     Pap smear performed today. If normal, I believe she can return to having no more pap smears as she is likely 20-30 years out from her cervical cancer. I am a little unsure as the whether she had cervical cancer, given her description of the treatment she underwent.  During that time period, it would not have been unusual to undergo a hysterectomy for persistent dysplasia, which is short of neoplasia.   She does have abdominal tenderness and her vaginal tenderness seems anterior in the bladder area.  Since she has a Urology appointment tomorrow, I will defer to urology.  She does not endorse urinary symptoms today. However, she does seem to have some tenderness beyond what I would expect. Given her lack of reproductive organs and no findings on pelvic exam, I do not believe her issue is gynecologic.  For her vaginal dryness, I recommended topical coconut oil or Replens.  She did not seem to  have a taper for the clotrimazole. I recommended a taper for her to use.  I also recommended some topical lubricants as she is not a good candidate for topical estrogen with an ER+ breast cancer in her recent history and on aromatase inhibitor therapy.    A total of 35 minutes  were spent face-to-face with the patient as well as preparation, review, communication, and documentation during this encounter.    Prentice Docker, MD 06/12/2020 12:54 PM    CC: Jules Husbands, [place],  [place] [id number]

## 2020-06-13 ENCOUNTER — Ambulatory Visit (INDEPENDENT_AMBULATORY_CARE_PROVIDER_SITE_OTHER): Payer: Medicaid Other | Admitting: Urology

## 2020-06-13 ENCOUNTER — Encounter: Payer: Self-pay | Admitting: Urology

## 2020-06-13 VITALS — BP 151/72 | HR 68 | Ht 61.0 in | Wt 140.0 lb

## 2020-06-13 DIAGNOSIS — K5909 Other constipation: Secondary | ICD-10-CM | POA: Diagnosis not present

## 2020-06-13 DIAGNOSIS — R300 Dysuria: Secondary | ICD-10-CM

## 2020-06-13 LAB — URINALYSIS, COMPLETE
Bilirubin, UA: NEGATIVE
Glucose, UA: NEGATIVE
Ketones, UA: NEGATIVE
Leukocytes,UA: NEGATIVE
Nitrite, UA: NEGATIVE
Protein,UA: NEGATIVE
RBC, UA: NEGATIVE
Specific Gravity, UA: 1.01 (ref 1.005–1.030)
Urobilinogen, Ur: 0.2 mg/dL (ref 0.2–1.0)
pH, UA: 7 (ref 5.0–7.5)

## 2020-06-13 LAB — MICROSCOPIC EXAMINATION
Bacteria, UA: NONE SEEN
Epithelial Cells (non renal): NONE SEEN /hpf (ref 0–10)
RBC, Urine: NONE SEEN /hpf (ref 0–2)
WBC, UA: NONE SEEN /hpf (ref 0–5)

## 2020-06-13 MED ORDER — SENNOSIDES-DOCUSATE SODIUM 8.6-50 MG PO TABS: 1.0000 | ORAL_TABLET | Freq: Every day | ORAL | 3 refills | Status: AC

## 2020-06-13 NOTE — Patient Instructions (Signed)
Constipation, Adult Constipation is when a person has trouble pooping (having a bowel movement). When you have this condition, you may poop fewer than 3 times a week. Your poop (stool) may also be dry, hard, or bigger than normal. Follow these instructions at home: Eating and drinking  Eat foods that have a lot of fiber, such as: ? Fresh fruits and vegetables. ? Whole grains. ? Beans.  Eat less of foods that are low in fiber and high in fat and sugar, such as: ? French fries. ? Hamburgers. ? Cookies. ? Candy. ? Soda.  Drink enough fluid to keep your pee (urine) pale yellow.   General instructions  Exercise regularly or as told by your doctor. Try to do 150 minutes of exercise each week.  Go to the restroom when you feel like you need to poop. Do not hold it in.  Take over-the-counter and prescription medicines only as told by your doctor. These include any fiber supplements.  When you poop: ? Do deep breathing while relaxing your lower belly (abdomen). ? Relax your pelvic floor. The pelvic floor is a group of muscles that support the rectum, bladder, and intestines (as well as the uterus in women).  Watch your condition for any changes. Tell your doctor if you notice any.  Keep all follow-up visits as told by your doctor. This is important. Contact a doctor if:  You have pain that gets worse.  You have a fever.  You have not pooped for 4 days.  You vomit.  You are not hungry.  You lose weight.  You are bleeding from the opening of the butt (anus).  You have thin, pencil-like poop. Get help right away if:  You have a fever, and your symptoms suddenly get worse.  You leak poop or have blood in your poop.  Your belly feels hard or bigger than normal (bloated).  You have very bad belly pain.  You feel dizzy or you faint. Summary  Constipation is when a person poops fewer than 3 times a week, has trouble pooping, or has poop that is dry, hard, or bigger than  normal.  Eat foods that have a lot of fiber.  Drink enough fluid to keep your pee (urine) pale yellow.  Take over-the-counter and prescription medicines only as told by your doctor. These include any fiber supplements. This information is not intended to replace advice given to you by your health care provider. Make sure you discuss any questions you have with your health care provider. Document Revised: 01/19/2019 Document Reviewed: 01/19/2019 Elsevier Patient Education  2021 Elsevier Inc.  

## 2020-06-13 NOTE — Progress Notes (Signed)
   06/13/2020 5:20 PM   Wanda Reed June 09, 1955 [id number]  Reason for visit: Dysuria, urinary symptoms  HPI: I saw Wanda Reed in clinic for the above issues.  She is a 65 year old female with a distant history of hysterectomy and mild chronic right hydronephrosis on CT in the past who has multiple ER visits over the last few months for dysuria.  She has been treated with numerous courses of antibiotics.  Urinalysis was suspicious for UTI, but culture was negative.  She denies any right-sided flank pain at this time.  She is also had significant problems with constipation which she feels contributes to her urinary symptoms and abdominal discomfort.  She does think the antibiotics have improved her symptoms.  She has a history of breast cancer, and is not a candidate for topical vaginal estrogen cream.  She was recently seen by her GYN who recommended urology follow-up.  I previously have seen her multiple times for similar complaints, and had ordered a pelvic MRI to evaluate for a urethral diverticulum, but this was refused by insurance, and she ultimately underwent a transvaginal ultrasound that was benign.  Urinalysis today is completely benign with 0 WBCs, 0 RBCs, no bacteria, no yeast, nitrite negative, no leukocytes.  I do not have a good answer for the etiology of her urinary symptoms.  We could consider cystoscopy in the future, try again to get a pelvic MRI done to evaluate for a urethral diverticulum.  We could also consider a right retrograde pyelogram with her history of hysterectomy and clips near the right distal ureter with mild hydronephrosis, however she is not having any right-sided flank pain at this time.  Other etiologies could include bladder pain syndrome or interstitial cystitis.  -Trial of senna docusate for her persistent constipation, complete course of antibiotics prescribed by ED -RTC 1 month symptom check, cystoscopy at that visit if persistent  symptoms -Consider MRI pelvis in the future to evaluate for urethral diverticulum If all of the above are negative, consider more of an interstitial cystitis type approach, and consider a trial of amitriptyline   Billey Co, MD  Wanda Reed [address] [place], [place] [address] [phone]

## 2020-06-14 ENCOUNTER — Encounter: Payer: Self-pay | Admitting: Obstetrics and Gynecology

## 2020-06-14 LAB — CYTOLOGY - PAP
Comment: NEGATIVE
Diagnosis: NEGATIVE
High risk HPV: NEGATIVE

## 2020-07-11 ENCOUNTER — Ambulatory Visit (INDEPENDENT_AMBULATORY_CARE_PROVIDER_SITE_OTHER): Payer: Medicare Other | Admitting: Gastroenterology

## 2020-07-11 ENCOUNTER — Other Ambulatory Visit: Payer: Self-pay

## 2020-07-11 ENCOUNTER — Encounter: Payer: Self-pay | Admitting: Gastroenterology

## 2020-07-11 VITALS — BP 121/75 | HR 70 | Ht 61.0 in | Wt 139.4 lb

## 2020-07-11 DIAGNOSIS — K7460 Unspecified cirrhosis of liver: Secondary | ICD-10-CM | POA: Diagnosis not present

## 2020-07-11 DIAGNOSIS — Z86010 Personal history of colonic polyps: Secondary | ICD-10-CM | POA: Diagnosis not present

## 2020-07-11 NOTE — Patient Instructions (Signed)
Enfermedad del hgado graso Fatty Liver Disease  El hgado transforma los alimentos en energa, elimina las sustancias txicas de la [place], [place] protenas importantes y absorbe las vitaminas necesarias de los alimentos. La enfermedad del hgado graso ocurre cuando se acumula demasiada grasa en las clulas del hgado. La enfermedad del hgado graso tambin se llama esteatosis heptica. En muchos casos, la enfermedad del hgado graso no provoca sntomas ni problemas. Con frecuencia, se diagnostica cuando se realizan estudios por otros motivos. Sin embargo, con el [place], el hgado graso puede provocar una inflamacin que posiblemente cause problemas hepticos ms graves, como la fibrosis heptica (cirrosis) o insuficiencia heptica. El hgado graso se asocia con la resistencia a la insulina, el aumento de la grasa corporal, la presin arterial alta (hipertensin) y el colesterol elevado. Estas son caractersticas del sndrome metablico y [place] el riesgo de accidente cerebrovascular, diabetes y enfermedad cardaca. Cules son las causas? Esta afeccin puede estar causada por componentes del sndrome metablico:  Obesidad.  Resistencia a la insulina.  Colesterol alto. Otras causas:  Consumo excesivo de alcohol.  Nutricin deficiente.  Sndrome de [place].  Embarazo.  Determinados medicamentos.  Txicos.  Algunas infecciones virales. Qu incrementa el riesgo? Es ms probable que tengan esta afeccin las personas que:  Consumen alcohol en exceso.  Tienen sobrepeso.  Tienen diabetes.  Tienen hepatitis.  Tienen un nivel alto de triglicridos.  Estn embarazadas. Cules son los signos o sntomas? Con frecuencia, la enfermedad del hgado graso no provoca sntomas. Si se desarrollan sntomas, estos pueden incluir:  Fatiga y debilidad.  Prdida de peso.  Confusin.  Nuseas, vmitos o dolor abdominal.  Color amarillo en la piel y en la zona blanca de los ojos  (ictericia).  Picazn en la piel. Cmo se diagnostica? Este trastorno puede diagnosticarse mediante:  Un examen fsico y los antecedentes mdicos.  Anlisis de [place].  Estudios de diagnstico por imgenes, como ecografa, exploracin por tomografa computarizada (TC) o Health visitor (RM).  Biopsia de hgado. Se extrae una pequea muestra de tejido del hgado usando [place]. La muestra se examina en el microscopio. Cmo se trata? Con frecuencia, la enfermedad del hgado graso es causada por otras afecciones. El tratamiento para el hgado graso puede incluir medicamentos y cambios en el estilo de vida para controlar enfermedades como:  Alcoholismo.  Colesterol alto.  Diabetes.  Sobrepeso u obesidad. Siga estas instrucciones en su casa:  No beba alcohol. Si tiene problemas para dejar de beber, consulte al mdico cmo puede dejar de beber de forma segura con la ayuda de medicamentos o un programa con supervisin. Esto es importante para evitar que la afeccin empeore.  Siga una dieta saludable como se lo haya indicado el mdico. Consulte al mdico sobre trabajar con un nutricionista para Animal nutritionist de alimentacin.  Haga ejercicio regularmente. Esto puede ayudarlo a [employer], y a Academic librarian y la diabetes. Hable con el mdico sobre qu actividades son mejores para usted y Audiological scientist de [place].  Use los medicamentos de venta libre y los recetados solamente como se lo haya indicado el mdico.  Cumpla con todas las visitas de seguimiento. Esto es importante.   Comunquese con un mdico si:  Tiene dificultad para controlar lo siguiente: ? Nivel de Dispensing optician. Esto es muy importante si tiene diabetes. ? Colesterol. ? El consumo de alcohol. Solicite ayuda de inmediato si:  Siente dolor abdominal.  Tiene ictericia.  Tiene nuseas y vomita.  Vomita sangre o  una sustancia similar al poso del caf.  Las heces son negras,  alquitranadas o sanguinolentas. Resumen  La enfermedad del hgado graso se desarrolla cuando se acumula demasiada grasa en las clulas del hgado.  Con frecuencia, la enfermedad del hgado no causa sntomas ni problemas. Sin embargo, con Physiological scientist, el hgado graso puede provocar una inflamacin que puede causar problemas hepticos ms graves, como fibrosis heptica (cirrosis).  Es ms probable que desarrolle esta afeccin si consume alcohol en exceso, est embarazada, tiene sobrepeso, diabetes, hepatitis o altos niveles de triglicridos o de colesterol.  Comunquese con el mdico si tiene problemas para controlar su azcar en la sangre, el colesterol o el consumo de alcohol. Esta informacin no tiene Marine scientist el consejo del mdico. Asegrese de hacerle al mdico cualquier pregunta que tenga. Document Revised: 01/10/2020 Document Reviewed: 01/10/2020 Elsevier Patient Education  2021 [employer].

## 2020-07-11 NOTE — Progress Notes (Signed)
Jonathon Bellows MD, MRCP(U.K) [address]  [place]  [place], [place] [address]  Main: [phone]  Fax: [phone]   Primary Care Physician: Denton Lank, MD  Primary Gastroenterologist:  Dr. Jonathon Bellows   Liver cirrhosis follow-up  HPI: Wanda Reed is a 65 y.o. female    Summary of history :  Patient speaks only Spanish: Entire visit conducted via in person interpreter  Initially referred and seen on 04/11/2020 for abdominal pain.  Previously seen Dr. Dahlia Byes for right upper quadrant pain.Worse after heavy meals.  Had a HIDA scan that demonstrated an ejection fraction of 36% ultrasound of the abdomen in 12/05/2019 demonstrated cirrhotic liver.  CT scan of the abdomen pelvis in April 2021 demonstrated cirrhotic liver.  Hydronephrosis was noted in the right kidney .   In 12/05/2019 LFTs were completely normal.  Hemoglobin was 12.5 g with an MCV of 90. The whole visit was via an interpreter who accompanied the patient in person.  The patient states that she has had right-sided pain for over 15 years but has recently got worse.  It is present all the time.  Can get worse few hours after meals later in the day.  The pain begins in her back radiates to the front of her abdomen and goes down shooting through her right leg all the way into her foot.  Resting relieves the pain.  She suffers from constipation .  Having a bowel movement does not relieve the pain .  She takes Tylenol for the back pain.  She denies any NSAID use.  She denies any prior knowledge of having liver cirrhosis.  No history of alcohol excess consumption.  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  Mother had liver disease unknown what type.  Denies any incarceration.    Interval history   04/11/2020-07/11/2020  04/17/2020: EGD: No evidence of esophageal varices.  Colonoscopy: 5 sessile polyps resected in the ascending colon 4 to 5 mm in size and 2 polyps of the same size in the  transverse colon resected.  There were tubular adenomas.  Repeat colonoscopy in 3 years.  She is doing well no complaints  04/12/2020: INR 1.0 immune to hepatitis A but not immune to hepatitis B, negative autoimmune screen, normal iron studies, celiac serology, ceruloplasmin and alpha-1 antitrypsin.    Current Outpatient Medications  Medication Sig Dispense Refill  . letrozole (FEMARA) 2.5 MG tablet TAKE 1 TABLET BY MOUTH ONCE DAILY. 90 tablet 3  . levothyroxine (SYNTHROID, LEVOTHROID) 100 MCG tablet Take 100 mcg by mouth daily before breakfast.    . lisinopril (PRINIVIL,ZESTRIL) 10 MG tablet Take 10 mg by mouth daily.    . metFORMIN (GLUCOPHAGE) 500 MG tablet Take 500 mg by mouth 2 (two) times daily.    [name] senna-docusate (SENOKOT-S) 8.6-50 MG tablet Take 1 tablet by mouth daily. 30 tablet 3  . clotrimazole (LOTRIMIN) 1 % cream Apply topically.     No current facility-administered medications for this visit.    Allergies as of 07/11/2020  . (No Known Allergies)    ROS:  General: Negative for anorexia, weight loss, fever, chills, fatigue, weakness. ENT: Negative for hoarseness, difficulty swallowing , nasal congestion. CV: Negative for chest pain, angina, palpitations, dyspnea on exertion, peripheral edema.  Respiratory: Negative for dyspnea at rest, dyspnea on exertion, cough, sputum, wheezing.  GI: See history of present illness. GU:  Negative for dysuria, hematuria, urinary incontinence, urinary frequency, nocturnal urination.  Endo: Negative for unusual  weight change.    Physical Examination:   BP 121/75 (BP Location: Left Arm, Patient Position: Sitting, Cuff Size: Large)   Pulse 70   Ht 5\' 1"  (1.549 m)   Wt 139 lb 6.4 oz (63.2 kg)   BMI 26.34 kg/m   General: Well-nourished, well-developed in no acute distress.  Eyes: No icterus. Conjunctivae pink. Neuro: Alert and oriented x 3.  Grossly intact. Skin: Warm and dry, no jaundice.   Psych: Alert and cooperative, normal  mood and affect.   Imaging Studies: No results found.  Assessment and Plan:   Wanda Reed is a 65 y.o. y/o female here to follow-up for cirrhosis.  Autoimmune screen and viral hepatitis screen were negative.  She is not immune to hepatitis B and needs a vaccine.  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  Plan 1.  Right upper quadrant ultrasound to screen for [facility] she is due 2.  EGD in 3 years i.e. January 2025 3.  Suggest exercise, weight loss, Mediterranean diet, lifestyle changes and close monitoring for cardiovascular risk factors and addressing the same as part of management for fatty liver disease. 4.  Request vaccine for hepatitis B as she is not immune 5.  Labs ordered to calculate meld score 6.  Since she had more than 3 tubular adenomas repeat colonoscopy will be in 3 years  Dr Jonathon Bellows  MD,MRCP [facility]) Follow up in 6 months

## 2020-07-12 LAB — COMPREHENSIVE METABOLIC PANEL
ALT: 25 IU/L (ref 0–32)
AST: 43 IU/L — ABNORMAL HIGH (ref 0–40)
Albumin/Globulin Ratio: 1.7 (ref 1.2–2.2)
Albumin: 4.5 g/dL (ref 3.8–4.8)
Alkaline Phosphatase: 151 IU/L — ABNORMAL HIGH (ref 44–121)
BUN/Creatinine Ratio: 15 (ref 12–28)
BUN: 10 mg/dL (ref 8–27)
Bilirubin Total: 0.4 mg/dL (ref 0.0–1.2)
CO2: 24 mmol/L (ref 20–29)
Calcium: 9.3 mg/dL (ref 8.7–10.3)
Chloride: 103 mmol/L (ref 96–106)
Creatinine, Ser: 0.66 mg/dL (ref 0.57–1.00)
Globulin, Total: 2.7 g/dL (ref 1.5–4.5)
Glucose: 102 mg/dL — ABNORMAL HIGH (ref 65–99)
Potassium: 4.5 mmol/L (ref 3.5–5.2)
Sodium: 141 mmol/L (ref 134–144)
Total Protein: 7.2 g/dL (ref 6.0–8.5)
eGFR: 98 mL/min/{1.73_m2} (ref >59)

## 2020-07-12 LAB — PROTIME-INR
INR: 1 (ref 0.9–1.2)
Prothrombin Time: 10.6 s (ref 9.1–12.0)

## 2020-07-13 ENCOUNTER — Other Ambulatory Visit: Payer: Self-pay | Admitting: *Deleted

## 2020-07-13 DIAGNOSIS — Z1231 Encounter for screening mammogram for malignant neoplasm of breast: Secondary | ICD-10-CM

## 2020-07-17 ENCOUNTER — Ambulatory Visit (LOCAL_COMMUNITY_HEALTH_CENTER): Payer: Medicare Other

## 2020-07-17 DIAGNOSIS — Z23 Encounter for immunization: Secondary | ICD-10-CM

## 2020-07-17 NOTE — Progress Notes (Signed)
In Nurse Clinic for Hep B #2. Tolerated well L deltoid. Updated NCIR copy given and explained. Dot Lanes, interpreter. Josie Saunders, RN

## 2020-07-31 ENCOUNTER — Ambulatory Visit
Admission: RE | Admit: 2020-07-31 | Discharge: 2020-07-31 | Disposition: A | Discharge status: Home / Self Care | Payer: Medicare Other | Source: Ambulatory Visit | Attending: Gastroenterology | Admitting: Gastroenterology

## 2020-07-31 ENCOUNTER — Other Ambulatory Visit: Payer: Self-pay

## 2020-07-31 DIAGNOSIS — K7460 Unspecified cirrhosis of liver: Secondary | ICD-10-CM | POA: Insufficient documentation

## 2020-08-14 ENCOUNTER — Encounter: Payer: Self-pay | Admitting: Urology

## 2020-08-14 ENCOUNTER — Other Ambulatory Visit: Payer: Self-pay

## 2020-08-14 ENCOUNTER — Ambulatory Visit (INDEPENDENT_AMBULATORY_CARE_PROVIDER_SITE_OTHER): Payer: Medicare Other | Admitting: Urology

## 2020-08-14 ENCOUNTER — Other Ambulatory Visit
Admission: RE | Admit: 2020-08-14 | Discharge: 2020-08-14 | Disposition: A | Discharge status: Home / Self Care | Payer: Medicare Other | Attending: Urology | Admitting: Urology

## 2020-08-14 VITALS — BP 124/80 | HR 90 | Ht 61.0 in | Wt 140.0 lb

## 2020-08-14 DIAGNOSIS — B373 Candidiasis of vulva and vagina: Secondary | ICD-10-CM

## 2020-08-14 DIAGNOSIS — N390 Urinary tract infection, site not specified: Secondary | ICD-10-CM

## 2020-08-14 DIAGNOSIS — B3731 Acute candidiasis of vulva and vagina: Secondary | ICD-10-CM

## 2020-08-14 LAB — URINALYSIS, COMPLETE (UACMP) WITH MICROSCOPIC
Bilirubin Urine: NEGATIVE
Glucose, UA: NEGATIVE mg/dL
Hgb urine dipstick: NEGATIVE
Ketones, ur: NEGATIVE mg/dL
Nitrite: POSITIVE — AB
Protein, ur: NEGATIVE mg/dL
Specific Gravity, Urine: <1.005 — ABNORMAL LOW (ref 1.005–1.030)
pH: 6 (ref 5.0–8.0)

## 2020-08-14 MED ORDER — SULFAMETHOXAZOLE-TRIMETHOPRIM 800-160 MG PO TABS: 1.0000 | ORAL_TABLET | Freq: Two times a day (BID) | ORAL | 0 refills | Status: AC

## 2020-08-14 MED ORDER — FLUCONAZOLE 150 MG PO TABS: 150.0000 mg | ORAL_TABLET | Freq: Once | ORAL | 0 refills | Status: AC

## 2020-08-14 NOTE — Progress Notes (Signed)
   08/14/2020 12:56 PM   [place] 26-Jun-1955 [id number]  Reason for visit: Follow up abdominal pain, urinary symptoms, chronic mild right hydronephrosis  HPI: I saw Wanda Reed in clinic for the above issues.  She is a 65 year old female with a distant history of hysterectomy and mild chronic right hydronephrosis on CT in the past who has multiple ER visits over the last few years for dysuria.  She has been treated with numerous courses of antibiotics.  Urinalysis was suspicious for UTI, but culture was negative.    Urinalysis at her last visit was benign.  She denies any right-sided flank pain at this time.  She has also had significant problems with constipation which she feels contributes to her urinary symptoms and abdominal discomfort.   She has a history of breast cancer, and is not a candidate for topical vaginal estrogen cream.   I previously have seen her multiple times for similar complaints, and had ordered a pelvic MRI to evaluate for a urethral diverticulum, but this was refused by insurance, and she ultimately underwent a transvaginal ultrasound that was benign.  At our last visit, I recommended focusing on her problems with constipation, and senna docusate was prescribed.  Since that time, she had been doing well with no pelvic pain, urinary symptoms, or dysuria, however yesterday developed a new onset severe urinary frequency and dysuria that she feels may be related to a UTI.  Urinalysis today pending, will send for culture and atypicals and call with results Bactrim DS twice daily x3 days   Billey Co, MD  [facility] [address] [place], [place] [address] [phone]

## 2020-08-15 ENCOUNTER — Ambulatory Visit: Payer: Self-pay | Admitting: Urology

## 2020-08-16 LAB — URINE CULTURE: Culture: NO GROWTH

## 2020-08-20 LAB — MISC LABCORP TEST (SEND OUT)
LabCorp test name: 86884
Labcorp test code: 86884

## 2020-12-20 ENCOUNTER — Other Ambulatory Visit: Payer: Self-pay

## 2020-12-20 ENCOUNTER — Ambulatory Visit
Admission: RE | Admit: 2020-12-20 | Discharge: 2020-12-20 | Disposition: A | Discharge status: Home / Self Care | Payer: Medicare Other | Source: Ambulatory Visit | Attending: Oncology | Admitting: Oncology

## 2020-12-20 DIAGNOSIS — Z1231 Encounter for screening mammogram for malignant neoplasm of breast: Secondary | ICD-10-CM | POA: Insufficient documentation

## 2020-12-24 ENCOUNTER — Other Ambulatory Visit: Payer: Self-pay | Admitting: Family Medicine

## 2020-12-24 DIAGNOSIS — Z0000 Encounter for general adult medical examination without abnormal findings: Secondary | ICD-10-CM

## 2020-12-31 NOTE — Progress Notes (Signed)
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 High risk MammaPrint.  INTERVAL HISTORY: Patient returns to clinic today for routine 48-monthevaluation.  She has chronic joint pain from arthritis, but otherwise feels well.  She is tolerating letrozole without significant side effects.  She has no neurologic complaints.  She has a good appetite and denies weight loss. She denies any chest pain, shortness of breath, cough, or hemoptysis.  She denies any nausea, vomiting, constipation, or diarrhea. She has no urinary complaints.  Patient offers no further specific complaints today.  REVIEW OF SYSTEMS:   Review of Systems  Constitutional: Negative.  Negative for fever, malaise/fatigue and weight loss.  Respiratory: Negative.  Negative for cough and shortness of breath.   Cardiovascular: Negative.  Negative for chest pain and leg swelling.  Gastrointestinal: Negative.  Negative for abdominal pain, nausea and vomiting.  Genitourinary: Negative.  Negative for dysuria.  Musculoskeletal:  Positive for joint pain. Negative for back pain.  Skin: Negative.  Negative for rash.  Neurological: Negative.  Negative for dizziness, focal weakness, weakness and headaches.  Psychiatric/Behavioral: Negative.  The patient is not nervous/anxious.    As per HPI. Otherwise, a complete review of systems is negative.  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 Node negative. T1b, N0.   [place]erv[place]   Hypertension    Hypothyroidism    Personal history of chemotherapy 11/2016-12/2016   right breast ca   Personal history of radiation therapy 09/15/2016   mammosite   PONV (postoperative nausea and vomiting)    Thyroid disease    Urinary tract infection     PAST SURGICAL HISTORY: Past Surgical History:  Procedure Laterality Date   ABDOMINAL HYSTERECTOMY     BREAST BIOPSY Right 08/08/2016   invasive mammary carcinoma   BREAST BIOPSY Right 06/01/2019   bx neg and abbcess drainage   BREAST LUMPECTOMY Right 08/28/2016   invasive mammary carcinoma, clear margins, negative lymph nodes   BREAST LUMPECTOMY WITH SENTINEL LYMPH NODE BIOPSY Right 08/28/2016   Procedure: BREAST LUMPECTOMY WITH SENTINEL LYMPH NODE BX;  Surgeon: BRobert Bellow MD;  Location: [facility] ORS;  Service: General;  Laterality: Right;   BREAST MAMMOSITE Right 09/15/2016   Procedure: MAMMOSITE BREAST;  Surgeon: BRobert Bellow MD;  Location: [facility] ORS;  Service: General;  Laterality: Right;   BREAST SURGERY Right    Breast Biopsy   CERVIX SURGERY     COLONOSCOPY  2008   COLONOSCOPY WITH PROPOFOL N/A 04/26/2020   Procedure: COLONOSCOPY WITH PROPOFOL;  Surgeon: AJonathon Bellows MD;  [facility];  Service: Gastroenterology;  Laterality: N/A;   ESOPHAGOGASTRODUODENOSCOPY (EGD) WITH PROPOFOL N/A 04/26/2020   Procedure: ESOPHAGOGASTRODUODENOSCOPY (EGD) WITH PROPOFOL;  Surgeon: AJonathon Bellows MD;  [facility];  Service: Gastroenterology;  Laterality: N/A;   EYE SURGERY Bilateral    Cataract Extraction with IOL   PORTA CATH INSERTION Right 11/20/2016   Procedure: PORTA CATH INSERTION;  Surgeon: [name], MD;  Location: [facility] OR[facility];  Service: General;  Laterality: Right;   PORTA CATH  REMOVAL  2018   RE-EXCISION OF BREAST LUMPECTOMY Right 07/14/2019   Procedure: RE-EXCISION OF Right BREAST LUMPECTOMY;  Surgeon: Jules Husbands, MD;  Location: [facility] ORS;  Service: General;  Laterality: Right;   SHOULDER ARTHROSCOPY Right 2012   torn ligaments    FAMILY HISTORY: Family History  Problem Relation Age of Onset   Breast cancer Neg Hx     ADVANCED DIRECTIVES (Y/N):  N  HEALTH MAINTENANCE: Social History   Tobacco Use   Smoking status: Never   Smokeless tobacco: Never  Vaping Use   Vaping Use: Never used  Substance Use Topics   Alcohol use: No   Drug use: No     Colonoscopy:  PAP:  Bone density:  Lipid panel:  No Known Allergies  Current Outpatient Medications  Medication Sig Dispense Refill   clotrimazole (LOTRIMIN) 1 % cream Apply topically.     levothyroxine (SYNTHROID, LEVOTHROID) 100 MCG tablet Take 100 mcg by mouth daily before breakfast.     lisinopril (PRINIVIL,ZESTRIL) 10 MG tablet Take 10 mg by mouth daily.     metFORMIN (GLUCOPHAGE) 500 MG tablet Take 500 mg by mouth 2 (two) times daily.     senna-docusate (SENOKOT-S) 8.6-50 MG tablet Take 1 tablet by mouth daily. 30 tablet 3   letrozole (FEMARA) 2.5 MG tablet Take 1 tablet (2.5 mg total) by mouth daily. 90 tablet 3   No current facility-administered medications for this visit.    OBJECTIVE: Vitals:   01/01/21 1137  BP: (!) 168/66  Pulse: 60  Resp: 16  Temp: 98.2 F (36.8 C)  SpO2: 100%     Body mass index is 26.89 kg/m.    ECOG FS:0 - Asymptomatic  General: Well-developed, well-nourished, no acute distress. Eyes: Pink conjunctiva, anicteric sclera. HEENT: Normocephalic, moist mucous membranes. Breast: Exam deferred today. Lungs: No audible wheezing or coughing. Heart: Regular rate and rhythm. Abdomen: Soft, nontender, no obvious distention. Musculoskeletal: No edema, cyanosis, or clubbing. Neuro: Alert, answering all questions appropriately. Cranial nerves grossly intact. Skin:  No rashes or petechiae noted. Psych: Normal affect.   LAB RESULTS:  Lab Results  Component Value Date   NA 141 07/11/2020   K 4.5 07/11/2020   CL 103 07/11/2020   CO2 24 07/11/2020   GLUCOSE 102 (H) 07/11/2020   BUN 10 07/11/2020   CREATININE 0.66 07/11/2020   CALCIUM 9.3 07/11/2020   PROT 7.2 07/11/2020   ALBUMIN 4.5 07/11/2020   AST 43 (H) 07/11/2020   ALT 25 07/11/2020   ALKPHOS 151 (H) 07/11/2020   BILITOT 0.4 07/11/2020   GFRNONAA >60 12/08/2019   GFRAA >60 12/08/2019    Lab Results  Component Value Date   WBC 3.0 (L) 12/08/2019   NEUTROABS 1.8 12/08/2019   HGB 12.5 12/08/2019   HCT 37.0 12/08/2019   MCV 90.0 12/08/2019   PLT 150 12/08/2019     STUDIES: MM 3D SCREEN BREAST BILATERAL  Result Date: 12/25/2020 CLINICAL DATA:  Screening. EXAM: DIGITAL SCREENING BILATERAL MAMMOGRAM WITH TOMOSYNTHESIS AND CAD TECHNIQUE: Bilateral screening digital craniocaudal and mediolateral oblique mammograms were obtained. Bilateral screening digital breast tomosynthesis was performed. The images were evaluated with computer-aided detection. COMPARISON:  Previous exam(s). ACR Breast Density  Category b: There are scattered areas of fibroglandular density. FINDINGS: There are no findings suspicious for malignancy. IMPRESSION: No mammographic evidence of malignancy. A result letter of this screening mammogram will be mailed directly to the patient. RECOMMENDATION: Screening mammogram in one year. (Code:SM-B-01Y) BI-RADS CATEGORY  1: Negative. Electronically Signed   By: Abelardo Diesel M.D.   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 High risk MammaPrint.  PLAN:    1. 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 Given  her high risk MammaPrint, she also received adjuvant chemotherapy with Taxotere and Cytoxan completing cycle 4 of 4 on December 24, 2016.  Patient then abruptly transferred care to [facility] and was initiated on aromatase inhibitor.  Anastrozole was discontinued secondary to side effects.  She is now on letrozole and will complete 5 years of treatment in approximately October 2023.  Given her high risk MammaPrint may consider extending treatment.  Her most recent mammogram on December 25, 2020 was reported as BI-RADS 1.  Repeat in October 2023.  Return to clinic in 6 months for routine evaluation.   2.  Bone health: Patient's most recent bone mineral density on June 09, 2019 reported T score of -0.7 which is considered normal.  Repeat in March 2023. 3.  Joint pain: Chronic and unchanged.  Continue symptomatic treatment with Advil.  Patient reports her primary care physician has instructed her not to take Tylenol.   4.  Abdominal pain/bloating: Patient does not complain of this today.  Recent colonoscopy and EGD revealed no obvious pathology.  Imaging was unrevealing.  Continue follow-up with primary care as scheduled.    Interpreter was present throughout the entire telemedicine visit.  Patient expressed understanding and was in agreement with this plan. She also understands that She can call clinic at any time with any questions, concerns, or complaints.   Cancer Staging Malignant neoplasm of upper-outer quadrant of right breast in female, estrogen receptor positive ([place]) Staging form: Breast, AJCC 8th Edition - Pathologic stage from 10/01/2016: Stage IA (pT1b, pN0, cM0, G2, ER+, PR-, HER2-) - Signed by Lloyd Huger, MD on 10/01/2016 Neoadjuvant therapy: No Multigene prognostic tests performed: MammaPrint Histologic grading system: 3 grade system Laterality: Right   Lloyd Huger, MD   01/01/2021 6:02 PM

## 2021-01-01 ENCOUNTER — Other Ambulatory Visit: Payer: Self-pay

## 2021-01-01 ENCOUNTER — Inpatient Hospital Stay: Payer: Medicare Other | Attending: Oncology | Admitting: Oncology

## 2021-01-01 VITALS — BP 168/66 | HR 60 | Temp 98.2°F | Resp 16 | Wt 142.3 lb

## 2021-01-01 DIAGNOSIS — Z9221 Personal history of antineoplastic chemotherapy: Secondary | ICD-10-CM | POA: Diagnosis not present

## 2021-01-01 DIAGNOSIS — Z923 Personal history of irradiation: Secondary | ICD-10-CM | POA: Insufficient documentation

## 2021-01-01 DIAGNOSIS — Z79899 Other long term (current) drug therapy: Secondary | ICD-10-CM | POA: Insufficient documentation

## 2021-01-01 DIAGNOSIS — Z79811 Long term (current) use of aromatase inhibitors: Secondary | ICD-10-CM | POA: Diagnosis not present

## 2021-01-01 DIAGNOSIS — C50411 Malignant neoplasm of upper-outer quadrant of right female breast: Secondary | ICD-10-CM | POA: Insufficient documentation

## 2021-01-01 DIAGNOSIS — Z7984 Long term (current) use of oral hypoglycemic drugs: Secondary | ICD-10-CM | POA: Diagnosis not present

## 2021-01-01 DIAGNOSIS — E039 Hypothyroidism, unspecified: Secondary | ICD-10-CM | POA: Insufficient documentation

## 2021-01-01 DIAGNOSIS — Z170 Estrogen receptor positive status [ER+]: Secondary | ICD-10-CM | POA: Diagnosis not present

## 2021-01-01 DIAGNOSIS — I10 Essential (primary) hypertension: Secondary | ICD-10-CM | POA: Diagnosis not present

## 2021-01-01 MED ORDER — LETROZOLE 2.5 MG PO TABS: 2.5000 mg | ORAL_TABLET | Freq: Every day | ORAL | 3 refills | Status: DC

## 2021-01-02 ENCOUNTER — Ambulatory Visit (INDEPENDENT_AMBULATORY_CARE_PROVIDER_SITE_OTHER): Payer: Medicare Other | Admitting: Surgery

## 2021-01-02 ENCOUNTER — Other Ambulatory Visit: Payer: Self-pay

## 2021-01-02 ENCOUNTER — Encounter: Payer: Self-pay | Admitting: Surgery

## 2021-01-02 ENCOUNTER — Other Ambulatory Visit
Admission: RE | Admit: 2021-01-02 | Discharge: 2021-01-02 | Disposition: A | Discharge status: Home / Self Care | Payer: Medicare Other | Attending: Surgery | Admitting: Surgery

## 2021-01-02 VITALS — BP 138/80 | HR 80 | Temp 98.5°F | Ht 60.0 in | Wt 139.8 lb

## 2021-01-02 DIAGNOSIS — R1011 Right upper quadrant pain: Secondary | ICD-10-CM | POA: Insufficient documentation

## 2021-01-02 DIAGNOSIS — H9313 Tinnitus, bilateral: Secondary | ICD-10-CM | POA: Diagnosis not present

## 2021-01-02 DIAGNOSIS — Z853 Personal history of malignant neoplasm of breast: Secondary | ICD-10-CM

## 2021-01-02 LAB — CBC WITH DIFFERENTIAL/PLATELET
Abs Immature Granulocytes: 0.01 10*3/uL (ref 0.00–0.07)
Basophils Absolute: 0 10*3/uL (ref 0.0–0.1)
Basophils Relative: 0 %
Eosinophils Absolute: 0.1 10*3/uL (ref 0.0–0.5)
Eosinophils Relative: 2 %
HCT: 38.8 % (ref 36.0–46.0)
Hemoglobin: 13.2 g/dL (ref 12.0–15.0)
Immature Granulocytes: 0 %
Lymphocytes Relative: 28 %
Lymphs Abs: 1.4 10*3/uL (ref 0.7–4.0)
MCH: 31.1 pg (ref 26.0–34.0)
MCHC: 34 g/dL (ref 30.0–36.0)
MCV: 91.3 fL (ref 80.0–100.0)
Monocytes Absolute: 0.6 10*3/uL (ref 0.1–1.0)
Monocytes Relative: 11 %
Neutro Abs: 2.9 10*3/uL (ref 1.7–7.7)
Neutrophils Relative %: 59 %
Platelets: 152 10*3/uL (ref 150–400)
RBC: 4.25 MIL/uL (ref 3.87–5.11)
RDW: 12.6 % (ref 11.5–15.5)
WBC: 5.1 10*3/uL (ref 4.0–10.5)
nRBC: 0 % (ref 0.0–0.2)

## 2021-01-02 LAB — COMPREHENSIVE METABOLIC PANEL
ALT: 24 U/L (ref 0–44)
AST: 42 U/L — ABNORMAL HIGH (ref 15–41)
Albumin: 3.8 g/dL (ref 3.5–5.0)
Alkaline Phosphatase: 103 U/L (ref 38–126)
Anion gap: 8 (ref 5–15)
BUN: 10 mg/dL (ref 8–23)
CO2: 27 mmol/L (ref 22–32)
Calcium: 9.5 mg/dL (ref 8.9–10.3)
Chloride: 102 mmol/L (ref 98–111)
Creatinine, Ser: 0.51 mg/dL (ref 0.44–1.00)
GFR, Estimated: >60 mL/min (ref >60)
Glucose, Bld: 87 mg/dL (ref 70–99)
Potassium: 4 mmol/L (ref 3.5–5.1)
Sodium: 137 mmol/L (ref 135–145)
Total Bilirubin: 0.5 mg/dL (ref 0.3–1.2)
Total Protein: 7.7 g/dL (ref 6.5–8.1)

## 2021-01-02 NOTE — Progress Notes (Signed)
Outpatient Surgical Follow Up  01/02/2021  Wanda Reed Allayne Butcher is an 65 y.o. female.   No chief complaint on file.   HPI: 65 year old female well-known to me with history of right breast cancer status post lumpectomy and MammoSite radiation in 2018.   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  She recovered very well from this and was very happy with the results after the operation as her preoperatively symptoms resolved completely.  .  No fevers no chills no evidence of biliary obstruction.   Recent mammogram 12/20/20 personally reviewed showing no evidence of any suspicious lesions.  She has no complaints regarding her breast. Continues to endorse intermittent right upper quadrant pain usually after heavy meals.  Recent work-up revealed no evidence of cholelithiasis and decreased ejection fraction but is still within limits.  She wishes to check again for for potential adenomyomatosis or any sludge or stones that may be causing some of the right upper quadrant.  She also states that recently has noticed significant right ear pain as well as passing right ear.  She reports that the pain is intermittent and also worsening when she chews.  She also reports significant pain on the right temporomandibular joint. She was seen by ENT doctor at [facility] more than 10 years ago. 5 months ago she did have a CMP that showed mild ovation of the AST and alkaline phosphatase.  Past Medical History:  Diagnosis Date   Anxiety    BRCA2 positive    pt and 2 daughters are positive   Breast cancer ([place]) 07/2016   right breast lumpectomy with chemo and mammosite   Breast cancer of upper-outer quadrant of right female breast ([place]) 08/2016   1.0 cm ER+, PR-, Her 2 neu not overexpressed. Node negative. T1b, N0.   Cervical cancer ([place]) 1990    Hypertension    Hypothyroidism    Personal history of chemotherapy 11/2016-12/2016   right breast ca   Personal history of radiation therapy 09/15/2016   mammosite   PONV (postoperative nausea and vomiting)    Thyroid disease    Urinary tract infection     Past Surgical History:  Procedure Laterality Date   ABDOMINAL HYSTERECTOMY     BREAST BIOPSY Right 08/08/2016   invasive mammary carcinoma   BREAST BIOPSY Right 06/01/2019   bx neg and abbcess drainage   BREAST LUMPECTOMY Right 08/28/2016   invasive mammary carcinoma, clear margins, negative lymph nodes   BREAST LUMPECTOMY WITH SENTINEL LYMPH NODE BIOPSY Right 08/28/2016   Procedure: BREAST LUMPECTOMY WITH SENTINEL LYMPH NODE BX;  Surgeon: [name], MD;  Location: [facility] ORS;  Service: General;  Laterality: Right;   BREAST MAMMOSITE Right 09/15/2016   Procedure: MAMMOSITE BREAST;  Surgeon: [name], MD;  Location: [facility] OR[facility];  Service: General;  Laterality: Right;   BREAST SURGERY Right    Breast Biopsy   CERVIX SURGERY     COLONOSCOPY  2008   COLONOSCOPY WITH PROPOFOL N/A 04/26/2020   Procedure: COLONOSCOPY WITH PROPOFOL;  Surgeon: Jonathon Bellows, MD;  Location[facility];  Service: Gastroenterology;  Laterality: N/A;   ESOPHAGOGASTRODUODENOSCOPY (EGD) WITH PROPOFOL N/A 04/26/2020   Procedure: ESOPHAGOGASTRODUODENOSCOPY (EGD) WITH PROPOFOL;  Surgeon: Jonathon Bellows, MD;  [facility];  Service: Gastroenterology;  Laterality: N/A;   EYE SURGERY Bilateral    Cataract Extraction with IOL  PORTA CATH INSERTION Right 11/20/2016   Procedure: PORTA CATH INSERTION;  Surgeon: [name], MD;  Location: [facility] ORS;  Service: General;  Laterality: Right;   PORTA CATH REMOVAL  2018   RE-EXCISION OF BREAST LUMPECTOMY Right 07/14/2019   Procedure: RE-EXCISION OF Right BREAST LUMPECTOMY;  Surgeon: Jules Husbands, MD;  Location: [facility] ORS;  Service: General;  Laterality: Right;   SHOULDER ARTHROSCOPY Right 2012    torn ligaments    Family History  Problem Relation Age of Onset   Breast cancer Neg Hx     Social History:  reports that she has never smoked. She has never used smokeless tobacco. She reports that she does not drink alcohol and does not use drugs.  Allergies: No Known Allergies  Medications reviewed.    ROS Full ROS performed and is otherwise negative other than what is stated in HPI   There were no vitals taken for this visit.  Physical Exam Physical Exam Vitals and nursing note reviewed. Exam conducted with a chaperone present.  Constitutional:      Appearance: Normal appearance.  Eyes:     General: No scleral icterus.       Right eye: No discharge.        Left eye: No discharge.  Ears: Right and left external ears are clear, there is evidence of tenderness  on TMJ. There is tenderness around right mastoid process, no masses, no evidence of ear infection or abscess Cardiovascular:     Rate and Rhythm: Normal rate and regular rhythm.  Pulmonary:     Effort: Pulmonary effort is normal. No respiratory distress.     Breath sounds: Normal breath sounds. No stridor.     Comments: Breast with right lumpectomy scar no new suspicious masses or nodules. No lymphedema Abdominal:     General: Abdomen is flat. There is no distension.     Palpations: There is no mass.     Tenderness: There is no abdominal tenderness. There is no guarding or rebound.     Hernia: No hernia is present.  Musculoskeletal:        General: Normal range of motion.     Cervical back: Normal range of motion and neck supple. No rigidity or tenderness.  Lymphadenopathy:     Cervical: No cervical adenopathy.  Skin:    General: Skin is warm and dry.     Capillary Refill: Capillary refill takes less than 2 seconds.  Neurological:     General: No focal deficit present.     Mental Status: She is alert and oriented to person, place, and time.  Psychiatric:        Mood and Affect: Mood normal.         Behavior: Behavior normal.        Thought Content: Thought content normal.        Judgment: Judgment normal   Assessment/Plan: 65 year old female w hx of right breast cancer status post lumpectomy and MammoSite radiation in 2018.  No evidence of recurrence either on physical or mammography.  No need for further work-up at this time.  We will continue to do clinical follow-up in 1 year per her wishes. She does have temporomandibular joint pain that will be potentially causing symptoms.  We Will refer to Dr. Tami Ribas from ENT for further evaluation. Regarding RUQ pain and given that is persistent and she has elevated LFT we will repeat CMP and RUQ U/S. We will see her back in a few weeks.  Greater than 50% of the 40 minutes  visit was spent in counseling/coordination of care   [name], MD [place] Surgeon

## 2021-01-02 NOTE — Patient Instructions (Addendum)
Referral faxed to Dr[facility] for buzzing in your ears. Someone from their office will contact you to schedule an appointment . If you do not hear from their office within 7 days please call their office.   Ultrasound scheduled  01/14/21 @  9:30 at [facility]. Nothing to eat / drink 4 hours prior to scan.                                                        Please see your follow up appointment listed below.  We will contact you October 2023 to schedule your mammogram and follow up appointment with Dr.Pabon.

## 2021-01-10 ENCOUNTER — Other Ambulatory Visit: Payer: Self-pay | Admitting: Surgery

## 2021-01-10 DIAGNOSIS — R7989 Other specified abnormal findings of blood chemistry: Secondary | ICD-10-CM

## 2021-01-10 DIAGNOSIS — R1011 Right upper quadrant pain: Secondary | ICD-10-CM

## 2021-01-14 ENCOUNTER — Ambulatory Visit
Admission: RE | Admit: 2021-01-14 | Discharge: 2021-01-14 | Disposition: A | Discharge status: Home / Self Care | Payer: Medicare Other | Source: Ambulatory Visit | Attending: Surgery | Admitting: Surgery

## 2021-01-14 ENCOUNTER — Other Ambulatory Visit: Payer: Self-pay

## 2021-01-14 DIAGNOSIS — R7989 Other specified abnormal findings of blood chemistry: Secondary | ICD-10-CM | POA: Diagnosis present

## 2021-01-14 DIAGNOSIS — R1011 Right upper quadrant pain: Secondary | ICD-10-CM | POA: Diagnosis not present

## 2021-02-01 ENCOUNTER — Other Ambulatory Visit: Payer: Self-pay

## 2021-02-01 ENCOUNTER — Ambulatory Visit (LOCAL_COMMUNITY_HEALTH_CENTER): Payer: Medicare Other

## 2021-02-01 ENCOUNTER — Other Ambulatory Visit: Payer: Self-pay | Admitting: Unknown Physician Specialty

## 2021-02-01 DIAGNOSIS — H9311 Tinnitus, right ear: Secondary | ICD-10-CM

## 2021-02-01 DIAGNOSIS — Z23 Encounter for immunization: Secondary | ICD-10-CM

## 2021-02-01 NOTE — Progress Notes (Signed)
In Nurse clinic for vaccine. Hep B #3 administered today without difficulty. Updated NCIR copy given. Lang line, interpreter today. Josie Saunders, RN

## 2021-02-04 ENCOUNTER — Ambulatory Visit (INDEPENDENT_AMBULATORY_CARE_PROVIDER_SITE_OTHER): Payer: Medicare Other | Admitting: Surgery

## 2021-02-04 ENCOUNTER — Encounter: Payer: Self-pay | Admitting: Surgery

## 2021-02-04 ENCOUNTER — Other Ambulatory Visit: Payer: Self-pay

## 2021-02-04 VITALS — BP 148/78 | HR 81 | Temp 98.3°F | Ht 60.0 in | Wt 137.4 lb

## 2021-02-04 DIAGNOSIS — R1011 Right upper quadrant pain: Secondary | ICD-10-CM

## 2021-02-04 NOTE — Progress Notes (Signed)
Outpatient Surgical Follow Up  02/04/2021  Wanda Reed Wanda Reed is an 65 y.o. female.   Chief Complaint  Patient presents with   Routine Post Op    RUQ [place]     HPI: Wanda Reed is a 65 year old female well-known to me.  She follows with quadrant pain.  Now seems musculoskeletal in nature. 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  She did have a repeat ultrasound that have personally reviewed showing no evidence of gallstones.  She has some mild cirrhotic changes without ascites  Past Medical History:  Diagnosis Date   Anxiety    BRCA2 positive    pt and 2 daughters are positive   Breast cancer ([place]) 07/2016   right breast lumpectomy with chemo and mammosite   Breast cancer of upper-outer quadrant of right female breast ([place]) 08/2016   1.0 cm ER+, PR-, Her 2 neu not overexpressed. Node negative. T1b, N0.   Cervical cancer ([place]) 1990   Hypertension    Hypothyroidism    Personal history of chemotherapy 11/2016-12/2016   right breast ca   Personal history of radiation therapy 09/15/2016   mammosite   PONV (postoperative nausea and vomiting)    Thyroid disease    Urinary tract infection     Past Surgical History:  Procedure Laterality Date   ABDOMINAL HYSTERECTOMY     BREAST BIOPSY Right 08/08/2016   invasive mammary carcinoma   BREAST BIOPSY Right 06/01/2019   bx neg and abbcess drainage   BREAST LUMPECTOMY Right 08/28/2016   invasive mammary carcinoma, clear margins, negative lymph nodes   BREAST LUMPECTOMY WITH SENTINEL LYMPH NODE BIOPSY Right 08/28/2016   Procedure: BREAST LUMPECTOMY WITH SENTINEL LYMPH NODE BX;  Surgeon: [name], MD;  Location: [facility] ORS;  Service: General;  Laterality: Right;   BREAST MAMMOSITE Right 09/15/2016   Procedure: MAMMOSITE BREAST;  Surgeon: [name], MD;  Location: [facility] ORS;  Service: General;  Laterality: Right;   BREAST SURGERY Right    Breast Biopsy   CERVIX SURGERY     COLONOSCOPY  2008   COLONOSCOPY WITH PROPOFOL N/A  04/26/2020   Procedure: COLONOSCOPY WITH PROPOFOL;  Surgeon: Jonathon Bellows, MD;  Location: [facility] ENDOSCOPY;  Service: Gastroenterology;  Laterality: N/A;   ESOPHAGOGASTRODUODENOSCOPY (EGD) WITH PROPOFOL N/A 04/26/2020   Procedure: ESOPHAGOGASTRODUODENOSCOPY (EGD) WITH PROPOFOL;  Surgeon: Jonathon Bellows, MD;  Location: [facility] ENDOSCOPY;  Service: Gastroenterology;  Laterality: N/A;   EYE SURGERY Bilateral    Cataract Extraction with IOL   PORTA CATH INSERTION Right 11/20/2016   Procedure: PORTA CATH INSERTION;  Surgeon: [name], MD;  Location: [facility] ORS;  Service: General;  Laterality: Right;   PORTA CATH REMOVAL  2018   RE-EXCISION OF BREAST LUMPECTOMY Right 07/14/2019   Procedure: RE-EXCISION OF Right BREAST LUMPECTOMY;  Surgeon: Jules Husbands, MD;  Location: [facility] ORS;  Service: General;  Laterality: Right;   SHOULDER ARTHROSCOPY Right 2012   torn ligaments    Family History  Problem Relation Age of Onset   Breast cancer Neg Hx     Social History:  reports that she has never smoked. She has never used smokeless tobacco. She reports that she does not drink alcohol and does not use drugs.  Allergies: No Known Allergies  Medications reviewed.    ROS Full ROS performed and is otherwise negative other than what is stated in HPI   BP (!) 148/78   Pulse 81   Temp 98.3 F (36.8 C) (Oral)   Ht 5' (1.524 m)  Wt 137 lb 6.4 oz (62.3 kg)   SpO2 94%   BMI 26.83 kg/m   Physical Exam Vitals and nursing note reviewed. Exam conducted with a chaperone present.  Constitutional:      General: She is not in acute distress.    Appearance: Normal appearance.  Pulmonary:     Effort: Pulmonary effort is normal. No respiratory distress.     Breath sounds: No stridor.  Abdominal:     General: Abdomen is flat.     Palpations: There is no mass.     Tenderness: There is abdominal tenderness. There is no right CVA tenderness, left CVA tenderness or rebound.  Skin:    General: Skin is warm and  dry.     Capillary Refill: Capillary refill takes less than 2 seconds.     Coloration: Skin is not jaundiced.  Neurological:     General: No focal deficit present.     Mental Status: She is alert and oriented to person, place, and time.  Psychiatric:        Mood and Affect: Mood normal.        Behavior: Behavior normal.        Thought Content: Thought content normal.        Judgment: Judgment normal.    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  Discussed with her about doing some over-the-counter anti-inflammatories and physical therapy as needed.  May do also some warm compresses.  No need for surgical intervention.  We will continue to follow her regarding her breast on a routine basis next year. Is note that I spent greater than 30 minutes in this encounter including personally reviewing the images, coordinating the patient's care, documenting appropriately in the chart and counseling the patient.   [name], MD [facility] General Surgeon

## 2021-02-04 NOTE — Patient Instructions (Addendum)
Please call the office if you have any questions or concerns.We will call you in October 2023 to schedule breast exam after you have the Mammogram done.

## 2021-02-18 ENCOUNTER — Other Ambulatory Visit: Payer: Medicare Other

## 2021-02-20 ENCOUNTER — Ambulatory Visit
Admission: RE | Admit: 2021-02-20 | Discharge: 2021-02-20 | Disposition: A | Discharge status: Home / Self Care | Payer: Medicare Other | Source: Ambulatory Visit | Attending: Unknown Physician Specialty | Admitting: Unknown Physician Specialty

## 2021-02-20 ENCOUNTER — Other Ambulatory Visit: Payer: Self-pay

## 2021-02-20 DIAGNOSIS — H9311 Tinnitus, right ear: Secondary | ICD-10-CM

## 2021-02-20 MED ORDER — GADOBENATE DIMEGLUMINE 529 MG/ML IV SOLN
12.0000 mL | Freq: Once | INTRAVENOUS | Status: AC | PRN
  Administered 2021-02-20: 12 mL via INTRAVENOUS

## 2021-04-04 ENCOUNTER — Other Ambulatory Visit: Payer: Self-pay | Admitting: Physical Medicine & Rehabilitation

## 2021-04-04 DIAGNOSIS — M542 Cervicalgia: Secondary | ICD-10-CM

## 2021-04-04 DIAGNOSIS — G8929 Other chronic pain: Secondary | ICD-10-CM

## 2021-04-15 ENCOUNTER — Ambulatory Visit
Admission: RE | Admit: 2021-04-15 | Discharge: 2021-04-15 | Disposition: A | Discharge status: Home / Self Care | Payer: Medicare Other | Source: Ambulatory Visit | Attending: Physical Medicine & Rehabilitation | Admitting: Physical Medicine & Rehabilitation

## 2021-04-15 DIAGNOSIS — G8929 Other chronic pain: Secondary | ICD-10-CM

## 2021-04-15 DIAGNOSIS — M5441 Lumbago with sciatica, right side: Secondary | ICD-10-CM | POA: Diagnosis not present

## 2021-04-15 DIAGNOSIS — M542 Cervicalgia: Secondary | ICD-10-CM | POA: Insufficient documentation

## 2021-05-13 ENCOUNTER — Other Ambulatory Visit: Payer: Self-pay | Admitting: Family Medicine

## 2021-05-13 DIAGNOSIS — R1011 Right upper quadrant pain: Secondary | ICD-10-CM

## 2021-05-22 ENCOUNTER — Ambulatory Visit
Admission: RE | Admit: 2021-05-22 | Discharge: 2021-05-22 | Disposition: A | Discharge status: Home / Self Care | Payer: Medicare Other | Source: Ambulatory Visit | Attending: Radiation Oncology | Admitting: Radiation Oncology

## 2021-05-22 ENCOUNTER — Other Ambulatory Visit: Payer: Self-pay

## 2021-05-22 ENCOUNTER — Encounter: Payer: Self-pay | Admitting: Radiation Oncology

## 2021-05-22 VITALS — BP 151/71 | HR 76 | Temp 97.5°F | Ht 59.06 in | Wt 132.0 lb

## 2021-05-22 DIAGNOSIS — Z79811 Long term (current) use of aromatase inhibitors: Secondary | ICD-10-CM | POA: Insufficient documentation

## 2021-05-22 DIAGNOSIS — M4802 Spinal stenosis, cervical region: Secondary | ICD-10-CM | POA: Diagnosis not present

## 2021-05-22 DIAGNOSIS — M47816 Spondylosis without myelopathy or radiculopathy, lumbar region: Secondary | ICD-10-CM | POA: Insufficient documentation

## 2021-05-22 DIAGNOSIS — C50411 Malignant neoplasm of upper-outer quadrant of right female breast: Secondary | ICD-10-CM | POA: Diagnosis not present

## 2021-05-22 DIAGNOSIS — Z923 Personal history of irradiation: Secondary | ICD-10-CM | POA: Insufficient documentation

## 2021-05-22 DIAGNOSIS — M47817 Spondylosis without myelopathy or radiculopathy, lumbosacral region: Secondary | ICD-10-CM | POA: Diagnosis not present

## 2021-05-22 DIAGNOSIS — Z170 Estrogen receptor positive status [ER+]: Secondary | ICD-10-CM | POA: Insufficient documentation

## 2021-05-22 NOTE — Progress Notes (Signed)
Radiation Oncology ?Follow up Note ? ?Name: Wanda Reed   ?Date:   05/22/2021 ?MRN:  [id number] ?DOB: 09-26-1955  ? ? ?This 66 y.o. female presents to the clinic today for 4 and half year follow-up status post accelerated partial breast radiation to her right breast for stage I ER/PR positive invasive mammary carcinoma. ? ?REFERRING PROVIDER: Denton Lank, MD ? ?HPI: Patient is a 66 year old Spanish-speaking female accompanied by interpreter who is now at close to 5 years having completed accelerated partial breast irradiation to her right breast for stage I ER/PR positive invasive mammary carcinoma.  Seen today in routine follow-up she is doing well.  She specifically denies breast tenderness cough or bone pain..  She had mammograms back in October which I reviewed were BI-RADS 1 negative.  She is also had MRI scans of her lumbar and cervical spine as well as brain earlier this year.  She does have a extraforaminal disc protrusion at L5-S1 and moderate facet arthrosis at L4-5.  She also has moderate right C5-6 neuroforaminal stenosis.  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  She is currently on Femara tolerating it well without side effect. ? ?COMPLICATIONS OF TREATMENT: none ? ?FOLLOW UP COMPLIANCE: keeps appointments  ? ?PHYSICAL EXAM:  ?BP (!) 151/71   Pulse 76   Temp (!) 97.5 ?F (36.4 ?C)   Ht 4' 11.06" (1.5 m)   Wt 132 lb (59.9 kg)   BMI 26.61 kg/m?  ?Lungs are clear to A&P cardiac examination essentially unremarkable with regular rate and rhythm. No dominant mass or nodularity is noted in either breast in 2 positions examined. Incision is well-healed. No axillary or supraclavicular adenopathy is appreciated. Cosmetic result is excellent.  Well-developed well-nourished patient in NAD. HEENT reveals PERLA, EOMI, discs not visualized.  Oral cavity is clear. No oral mucosal lesions are identified.  Neck is clear without evidence of cervical or supraclavicular adenopathy. Lungs are clear to A&P. Cardiac examination is essentially unremarkable with regular rate and rhythm without murmur rub or thrill. Abdomen is benign with no organomegaly or masses noted. Motor sensory and DTR levels are equal and symmetric in the upper and lower extremities. Cranial nerves II through XII are grossly intact. Proprioception is intact. No peripheral adenopathy or edema is identified. No motor or sensory levels are noted. Crude visual fields are within normal range. ? ?RADIOLOGY RESULTS: MRI scans and mammograms reviewed compatible with above-stated findings ? ?PLAN: The present time patient continues to do well now close to 5 years with no evidence of disease.  And pleased with her overall progress.  She continues on Femara without side effect.  I am going to discontinue follow-up care.  I be happy to reevaluate the patient anytime should that be indicated.  Patient is to call with any concerns. ? ?I would like to take this opportunity to thank you for allowing me to participate in the care of your patient.. ?  ? Noreene Filbert, MD ? ?

## 2021-05-24 ENCOUNTER — Ambulatory Visit
Admission: RE | Admit: 2021-05-24 | Discharge: 2021-05-24 | Disposition: A | Discharge status: Home / Self Care | Payer: Medicare Other | Source: Ambulatory Visit | Attending: Family Medicine | Admitting: Family Medicine

## 2021-05-24 DIAGNOSIS — R1011 Right upper quadrant pain: Secondary | ICD-10-CM

## 2021-05-27 ENCOUNTER — Other Ambulatory Visit: Payer: Self-pay | Admitting: *Deleted

## 2021-05-27 MED ORDER — LETROZOLE 2.5 MG PO TABS: 2.5000 mg | ORAL_TABLET | Freq: Every day | ORAL | 2 refills | Status: AC

## 2021-05-30 ENCOUNTER — Ambulatory Visit: Payer: Self-pay | Admitting: General Surgery

## 2021-05-30 NOTE — H&P (Addendum)
PATIENT PROFILE: ?Wanda Reed is a 66 y.o. female who presents to the Clinic for consultation at the request of Fields, NP for evaluation of cholelithiasis. ? ?PCP:  Joan Flores, NP ? ?HISTORY OF PRESENT ILLNESS: ?[name] reports she has been dealing with for epicondyle pain since more than a year ago.  She endorses that the pain is on the right upper quadrant.  Patient feels that the pain is exacerbated by greasy food.  There has been no alleviating factor.  The pain is constant but is intensified with eating.  No pain radiation.  Patient has had multiple evaluation including upper endoscopy.  I personally evaluated the chart from [facility] epic and I evaluated the images of the upper endoscopy.  No ulcers or any pathology that can explain these right upper quadrant pain.  She has also multiple CT scan and abdominal ultrasound.  Most of the ultrasound did not shows any cholelithiasis but the last ultrasound done last month shows sludge.  I personally evaluated the multiple CT scan and abdominal ultrasounds.  I also reviewed the previous evaluation by a general surgeon that thought that her pain was more musculoskeletal.  No surgery was recommended at that time.  Now the patient is associating the pain more with eating then with movement. ? ? ?PROBLEM LIST: ?Problem List  Date Reviewed: 05/29/2021  ? ?       Noted  ? Type 2 diabetes mellitus with hyperglycemia, without long-term current use of insulin (CMS-HCC) 05/02/2021  ? Hypothyroid, unspecified 05/02/2021  ? Essential hypertension 05/02/2021  ? Other cirrhosis of liver (CMS-HCC) 05/02/2021  ? Headache disorder 05/02/2021  ? Neck pain 05/02/2021  ? Tinnitus of right ear 05/02/2021  ? Aortitis (CMS-HCC) 12/18/2016  ? Elevated erythrocyte sedimentation rate 12/18/2016  ? CRP elevated 12/18/2016  ? Wide-complex tachycardia 08/28/2016  ? Malignant neoplasm of upper-outer quadrant of right breast in female, estrogen receptor positive (CMS-HCC)  08/18/2016  ? ? ?GENERAL REVIEW OF SYSTEMS:  ? ?General ROS: negative for - chills, fatigue, fever, weight gain or weight loss ?Allergy and Immunology ROS: negative for - hives  ?Hematological and Lymphatic ROS: negative for - bleeding problems or bruising, negative for palpable nodes ?Endocrine ROS: negative for - heat or cold intolerance, hair changes ?Respiratory ROS: negative for - cough, shortness of breath or wheezing ?Cardiovascular ROS: no chest pain or palpitations ?GI ROS: negative for nausea, vomiting, diarrhea, positive for abdominal pain and constipation ?Musculoskeletal ROS: negative for - joint swelling or muscle pain ?Neurological ROS: negative for - confusion, syncope ?Dermatological ROS: negative for pruritus and rash ?Psychiatric: negative for anxiety, depression, difficulty sleeping and memory loss ? ?MEDICATIONS: ?Current Outpatient Medications  ?Medication Sig Dispense Refill  ? acetaminophen (TYLENOL) 500 MG tablet Take by mouth    ? acyclovir (ZOVIRAX) 400 MG tablet Take 1 tablet (400 mg total) by mouth 3 (three) times daily 270 tablet 1  ? amLODIPine (NORVASC) 5 MG tablet Take 1 tablet (5 mg total) by mouth once daily 30 tablet 3  ? calcium carbonate/vitamin D3 (CALCIUM 500 + D, D3, ORAL) Take 1,200 mg by mouth once daily    ? cetyl,stear.alcoh-prop gly-sls Crea Apply topically.    ? cranberry fruit concentrate (AZO CRANBERRY ORAL) Take 2 capsules by mouth once daily    ? gabapentin (NEURONTIN) 100 MG capsule Take  200 mg(2 tablets) twice a day 120 capsule 3  ? IBUPROFEN ORAL Take 2 tablets by mouth once daily as needed PRN    ?  letrozole (FEMARA) 2.5 mg tablet Take 1 tablet (2.5 mg total) by mouth once daily 90 tablet 1  ? levothyroxine (SYNTHROID) 100 MCG tablet Take 1 tablet (100 mcg total) by mouth once daily 90 tablet 1  ? lisinopriL (ZESTRIL) 10 MG tablet Take 1 tablet (10 mg total) by mouth once daily 90 tablet 1  ? loratadine (CLARITIN) 10 mg tablet Take by mouth Patient takes this  once a month before and after her chemo treatment.    ? metFORMIN (GLUCOPHAGE) 500 MG tablet Take 1 tablet (500 mg total) by mouth 2 (two) times daily 180 tablet 1  ? multivitamin with iron-minerals (SUPER THERA VITE M) tablet Take by mouth    ? ?No current facility-administered medications for this visit.  ? ? ?ALLERGIES: ?Patient has no known allergies. ? ?PAST MEDICAL HISTORY: ?Past Medical History:  ?Diagnosis Date  ? Anxiety   ? Aortitis (CMS-HCC)   ? Cancer (CMS-HCC)   ? Hyperkalemia   ? Hypertension   ? Sepsis (CMS-HCC)   ? Tachycardia, unspecified   ? Thyroid disease   ? ? ?PAST SURGICAL HISTORY: ?Past Surgical History:  ?Procedure Laterality Date  ? BREAST SURGERY    ? cervix surgery    ? EYE SURGERY    ? HYSTERECTOMY    ? shoulder surgery Right   ?  ? ?FAMILY HISTORY: ?History reviewed. No pertinent family history.  ? ?SOCIAL HISTORY: ?Social History  ? ?Socioeconomic History  ? Marital status: Single  ?Tobacco Use  ? Smoking status: Never  ? Smokeless tobacco: Never  ?Vaping Use  ? Vaping Use: Never used  ?Substance and Sexual Activity  ? Alcohol use: No  ? Drug use: No  ? Sexual activity: Defer  ? ? ?PHYSICAL EXAM: ?Vitals:  ? 05/30/21 1059  ?BP: 139/71  ?Pulse: 86  ? ?Body mass index is 25.97 kg/m?. ?Weight: 60.3 kg (133 lb)  ? ?GENERAL: Alert, active, oriented x3 ? ?HEENT: Pupils equal reactive to light. Extraocular movements are intact. Sclera clear. Palpebral conjunctiva normal red color.Pharynx clear. ? ?NECK: Supple with no palpable mass and no adenopathy. ? ?LUNGS: Sound clear with no rales rhonchi or wheezes. ? ?HEART: Regular rhythm S1 and S2 without murmur. ? ?ABDOMEN: Soft and depressible, nontender with no palpable mass, no hepatomegaly.  ? ?EXTREMITIES: Well-developed well-nourished symmetrical with no dependent edema. ? ?NEUROLOGICAL: Awake alert oriented, facial expression symmetrical, moving all extremities. ? ?REVIEW OF DATA: ?I have reviewed the following data today: ?Initial consult on  05/02/2021  ?Component Date Value  ? WBC (White Blood Cell Co* 05/02/2021 4.4   ? RBC (Red Blood Cell Coun* 05/02/2021 4.55   ? Hemoglobin 05/02/2021 13.4   ? Hematocrit 05/02/2021 41.6   ? MCV (Mean Corpuscular Vo* 05/02/2021 91.4   ? MCH (Mean Corpuscular He* 05/02/2021 29.5   ? MCHC (Mean Corpuscular H* 05/02/2021 32.2   ? Platelet Count 05/02/2021 133 (L)   ? RDW-CV (Red Cell Distrib* 05/02/2021 13.4   ? MPV (Mean Platelet Volum* 05/02/2021 12.1   ? Neutrophils 05/02/2021 2.70   ? Lymphocytes 05/02/2021 1.21   ? Monocytes 05/02/2021 0.31   ? Eosinophils 05/02/2021 0.11   ? Basophils 05/02/2021 0.02   ? Neutrophil % 05/02/2021 61.9   ? Lymphocyte % 05/02/2021 27.8   ? Monocyte % 05/02/2021 7.1   ? Eosinophil % 05/02/2021 2.5   ? Basophil% 05/02/2021 0.5   ? Immature Granulocyte % 05/02/2021 0.2   ? Immature Granulocyte Cou* 05/02/2021 0.01   ?  Glucose 05/02/2021 91   ? Sodium 05/02/2021 139   ? Potassium 05/02/2021 4.6   ? Chloride 05/02/2021 102   ? Carbon Dioxide (CO2) 05/02/2021 27.5   ? Urea Nitrogen (BUN) 05/02/2021 11   ? Creatinine 05/02/2021 0.5 (L)   ? Glomerular Filtration Ra* 05/02/2021 124   ? Calcium 05/02/2021 9.5   ? AST  05/02/2021 38   ? ALT  05/02/2021 19   ? Alk Phos (alkaline Phosp* 05/02/2021 105 (H)   ? Albumin 05/02/2021 4.1   ? Bilirubin, Total 05/02/2021 0.6   ? Protein, Total 05/02/2021 7.3   ? A/G Ratio 05/02/2021 1.3   ? Hemoglobin A1C 05/02/2021 6.2 (H)   ? Average Blood Glucose (C* 05/02/2021 131   ? Cholesterol, Total 05/02/2021 193   ? Triglyceride 05/02/2021 128   ? HDL (High Density Lipopr* 05/02/2021 70.7   ? LDL Calculated 05/02/2021 97   ? VLDL Cholesterol 05/02/2021 26   ? Cholesterol/HDL Ratio 05/02/2021 2.7   ? Thyroid Stimulating Horm* 05/02/2021 1.351   ? Color 05/02/2021 Yellow   ? Clarity 05/02/2021 Clear   ? Specific Gravity 05/02/2021 <=1.005   ? pH, Urine 05/02/2021 6.0   ? Protein, Urinalysis 05/02/2021 Negative   ? Glucose, Urinalysis 05/02/2021 Negative   ?  Ketones, Urinalysis 05/02/2021 Negative   ? Blood, Urinalysis 05/02/2021 Negative   ? Nitrite, Urinalysis 05/02/2021 Negative   ? Leukocyte Esterase, Urin* 05/02/2021 Negative   ? White Blood Cells, Urina* 02/16/

## 2021-05-30 NOTE — H&P (View-Only) (Signed)
PATIENT PROFILE: ?Wanda Reed is a 66 y.o. female who presents to the Clinic for consultation at the request of Fields, NP for evaluation of cholelithiasis. ? ?PCP:  Joan Flores, NP ? ?HISTORY OF PRESENT ILLNESS: ?Wanda Reed reports she has been dealing with for epicondyle pain since more than a year ago.  She endorses that the pain is on the right upper quadrant.  Patient feels that the pain is exacerbated by greasy food.  There has been no alleviating factor.  The pain is constant but is intensified with eating.  No pain radiation.  Patient has had multiple evaluation including upper endoscopy.  I personally evaluated the chart from [facility] epic and I evaluated the images of the upper endoscopy.  No ulcers or any pathology that can explain these right upper quadrant pain.  She has also multiple CT scan and abdominal ultrasound.  Most of the ultrasound did not shows any cholelithiasis but the last ultrasound done last month shows sludge.  I personally evaluated the multiple CT scan and abdominal ultrasounds.  I also reviewed the previous evaluation by a general surgeon that thought that her pain was more musculoskeletal.  No surgery was recommended at that time.  Now the patient is associating the pain more with eating then with movement. ? ? ?PROBLEM LIST: ?Problem List  Date Reviewed: 05/29/2021  ? ?       Noted  ? Type 2 diabetes mellitus with hyperglycemia, without long-term current use of insulin (CMS-HCC) 05/02/2021  ? Hypothyroid, unspecified 05/02/2021  ? Essential hypertension 05/02/2021  ? Other cirrhosis of liver (CMS-HCC) 05/02/2021  ? Headache disorder 05/02/2021  ? Neck pain 05/02/2021  ? Tinnitus of right ear 05/02/2021  ? Aortitis (CMS-HCC) 12/18/2016  ? Elevated erythrocyte sedimentation rate 12/18/2016  ? CRP elevated 12/18/2016  ? Wide-complex tachycardia 08/28/2016  ? Malignant neoplasm of upper-outer quadrant of right breast in female, estrogen receptor positive (CMS-HCC)  08/18/2016  ? ? ?GENERAL REVIEW OF SYSTEMS:  ? ?General ROS: negative for - chills, fatigue, fever, weight gain or weight loss ?Allergy and Immunology ROS: negative for - hives  ?Hematological and Lymphatic ROS: negative for - bleeding problems or bruising, negative for palpable nodes ?Endocrine ROS: negative for - heat or cold intolerance, hair changes ?Respiratory ROS: negative for - cough, shortness of breath or wheezing ?Cardiovascular ROS: no chest pain or palpitations ?GI ROS: negative for nausea, vomiting, diarrhea, positive for abdominal pain and constipation ?Musculoskeletal ROS: negative for - joint swelling or muscle pain ?Neurological ROS: negative for - confusion, syncope ?Dermatological ROS: negative for pruritus and rash ?Psychiatric: negative for anxiety, depression, difficulty sleeping and memory loss ? ?MEDICATIONS: ?Current Outpatient Medications  ?Medication Sig Dispense Refill  ? acetaminophen (TYLENOL) 500 MG tablet Take by mouth    ? acyclovir (ZOVIRAX) 400 MG tablet Take 1 tablet (400 mg total) by mouth 3 (three) times daily 270 tablet 1  ? amLODIPine (NORVASC) 5 MG tablet Take 1 tablet (5 mg total) by mouth once daily 30 tablet 3  ? calcium carbonate/vitamin D3 (CALCIUM 500 + D, D3, ORAL) Take 1,200 mg by mouth once daily    ? cetyl,stear.alcoh-prop gly-sls Crea Apply topically.    ? cranberry fruit concentrate (AZO CRANBERRY ORAL) Take 2 capsules by mouth once daily    ? gabapentin (NEURONTIN) 100 MG capsule Take  200 mg(2 tablets) twice a day 120 capsule 3  ? IBUPROFEN ORAL Take 2 tablets by mouth once daily as needed PRN    ?  letrozole (FEMARA) 2.5 mg tablet Take 1 tablet (2.5 mg total) by mouth once daily 90 tablet 1  ? levothyroxine (SYNTHROID) 100 MCG tablet Take 1 tablet (100 mcg total) by mouth once daily 90 tablet 1  ? lisinopriL (ZESTRIL) 10 MG tablet Take 1 tablet (10 mg total) by mouth once daily 90 tablet 1  ? loratadine (CLARITIN) 10 mg tablet Take by mouth Patient takes this  once a month before and after her chemo treatment.    ? metFORMIN (GLUCOPHAGE) 500 MG tablet Take 1 tablet (500 mg total) by mouth 2 (two) times daily 180 tablet 1  ? multivitamin with iron-minerals (SUPER THERA VITE M) tablet Take by mouth    ? ?No current facility-administered medications for this visit.  ? ? ?ALLERGIES: ?Patient has no known allergies. ? ?PAST MEDICAL HISTORY: ?Past Medical History:  ?Diagnosis Date  ? Anxiety   ? Aortitis (CMS-HCC)   ? Cancer (CMS-HCC)   ? Hyperkalemia   ? Hypertension   ? Sepsis (CMS-HCC)   ? Tachycardia, unspecified   ? Thyroid disease   ? ? ?PAST SURGICAL HISTORY: ?Past Surgical History:  ?Procedure Laterality Date  ? BREAST SURGERY    ? cervix surgery    ? EYE SURGERY    ? HYSTERECTOMY    ? shoulder surgery Right   ?  ? ?FAMILY HISTORY: ?History reviewed. No pertinent family history.  ? ?SOCIAL HISTORY: ?Social History  ? ?Socioeconomic History  ? Marital status: Single  ?Tobacco Use  ? Smoking status: Never  ? Smokeless tobacco: Never  ?Vaping Use  ? Vaping Use: Never used  ?Substance and Sexual Activity  ? Alcohol use: No  ? Drug use: No  ? Sexual activity: Defer  ? ? ?PHYSICAL EXAM: ?Vitals:  ? 05/30/21 1059  ?BP: 139/71  ?Pulse: 86  ? ?Body mass index is 25.97 kg/m?. ?Weight: 60.3 kg (133 lb)  ? ?GENERAL: Alert, active, oriented x3 ? ?HEENT: Pupils equal reactive to light. Extraocular movements are intact. Sclera clear. Palpebral conjunctiva normal red color.Pharynx clear. ? ?NECK: Supple with no palpable mass and no adenopathy. ? ?LUNGS: Sound clear with no rales rhonchi or wheezes. ? ?HEART: Regular rhythm S1 and S2 without murmur. ? ?ABDOMEN: Soft and depressible, nontender with no palpable mass, no hepatomegaly.  ? ?EXTREMITIES: Well-developed well-nourished symmetrical with no dependent edema. ? ?NEUROLOGICAL: Awake alert oriented, facial expression symmetrical, moving all extremities. ? ?REVIEW OF DATA: ?I have reviewed the following data today: ?Initial consult on  05/02/2021  ?Component Date Value  ? WBC (White Blood Cell Co* 05/02/2021 4.4   ? RBC (Red Blood Cell Coun* 05/02/2021 4.55   ? Hemoglobin 05/02/2021 13.4   ? Hematocrit 05/02/2021 41.6   ? MCV (Mean Corpuscular Vo* 05/02/2021 91.4   ? MCH (Mean Corpuscular He* 05/02/2021 29.5   ? MCHC (Mean Corpuscular H* 05/02/2021 32.2   ? Platelet Count 05/02/2021 133 (L)   ? RDW-CV (Red Cell Distrib* 05/02/2021 13.4   ? MPV (Mean Platelet Volum* 05/02/2021 12.1   ? Neutrophils 05/02/2021 2.70   ? Lymphocytes 05/02/2021 1.21   ? Monocytes 05/02/2021 0.31   ? Eosinophils 05/02/2021 0.11   ? Basophils 05/02/2021 0.02   ? Neutrophil % 05/02/2021 61.9   ? Lymphocyte % 05/02/2021 27.8   ? Monocyte % 05/02/2021 7.1   ? Eosinophil % 05/02/2021 2.5   ? Basophil% 05/02/2021 0.5   ? Immature Granulocyte % 05/02/2021 0.2   ? Immature Granulocyte Cou* 05/02/2021 0.01   ?  Glucose 05/02/2021 91   ? Sodium 05/02/2021 139   ? Potassium 05/02/2021 4.6   ? Chloride 05/02/2021 102   ? Carbon Dioxide (CO2) 05/02/2021 27.5   ? Urea Nitrogen (BUN) 05/02/2021 11   ? Creatinine 05/02/2021 0.5 (L)   ? Glomerular Filtration Ra* 05/02/2021 124   ? Calcium 05/02/2021 9.5   ? AST  05/02/2021 38   ? ALT  05/02/2021 19   ? Alk Phos (alkaline Phosp* 05/02/2021 105 (H)   ? Albumin 05/02/2021 4.1   ? Bilirubin, Total 05/02/2021 0.6   ? Protein, Total 05/02/2021 7.3   ? A/G Ratio 05/02/2021 1.3   ? Hemoglobin A1C 05/02/2021 6.2 (H)   ? Average Blood Glucose (C* 05/02/2021 131   ? Cholesterol, Total 05/02/2021 193   ? Triglyceride 05/02/2021 128   ? HDL (High Density Lipopr* 05/02/2021 70.7   ? LDL Calculated 05/02/2021 97   ? VLDL Cholesterol 05/02/2021 26   ? Cholesterol/HDL Ratio 05/02/2021 2.7   ? Thyroid Stimulating Horm* 05/02/2021 1.351   ? Color 05/02/2021 Yellow   ? Clarity 05/02/2021 Clear   ? Specific Gravity 05/02/2021 <=1.005   ? pH, Urine 05/02/2021 6.0   ? Protein, Urinalysis 05/02/2021 Negative   ? Glucose, Urinalysis 05/02/2021 Negative   ?  Ketones, Urinalysis 05/02/2021 Negative   ? Blood, Urinalysis 05/02/2021 Negative   ? Nitrite, Urinalysis 05/02/2021 Negative   ? Leukocyte Esterase, Urin* 05/02/2021 Negative   ? White Blood Cells, Urina* 02/16/

## 2021-06-06 ENCOUNTER — Other Ambulatory Visit
Admission: RE | Admit: 2021-06-06 | Discharge: 2021-06-06 | Disposition: A | Discharge status: Home / Self Care | Payer: Medicare Other | Source: Ambulatory Visit | Attending: General Surgery | Admitting: General Surgery

## 2021-06-06 ENCOUNTER — Other Ambulatory Visit: Payer: Self-pay

## 2021-06-06 ENCOUNTER — Other Ambulatory Visit: Payer: Medicare Other

## 2021-06-06 HISTORY — DX: Prediabetes: R73.03

## 2021-06-06 NOTE — Patient Instructions (Addendum)
Your procedure is scheduled on: Wednesday June 12, 2021. ?Su procedimiento est? programado para: [facility] [place] 2023. ?Report to Day Surgery inside [facility] 2nd floor, stop by admissions desk first before getting on elevator.  ?Pres?ntese a: [facility] 2do piso, registrese primero en admisiones antes de subir al [employer].  ?To find out your arrival time please call [phone] between 1PM - 3PM on Tuesday June 11, 2021. ?Para saber su hora de llegada por favor llame al [phone] Wanda Reed la 1PM - 3PM el d?a: [facility] [place] 2023. ? ?Remember: Instructions that are not followed completely may result in serious medical risk, up to and including death,  ?or upon the discretion of your surgeon and anesthesiologist your surgery may need to be rescheduled.  ?Recuerde: Las instrucciones que no se siguen completamente pueden resultar en un riesgo de salud grave, incluyendo hasta  ?la [place] o a discreci?n de su cirujano y anestesi?logo, su cirug?a se puede posponer. ? ? __X_ 1.Do not eat food or drink fluids after midnight the night before your procedure. No  ?  gum chewing or hard candies.  ? ?  No coma nada ni beba nada despu?s de la medianoche de la noche anterior a su  ?  procedimiento. No coma chicles ni caramelos duros. ? ?            _X__ 2.Do Not Smoke or use e-cigarettes For 24 Hours Prior to Your Surgery.   ? Do not use any chewable tobacco products for at least 6  ? hours prior to surgery. ? ?  No fume ni use cigarrillos electr?nicos durante las 24 horas previas ?   a su cirug?a.  No use ning?n producto de tabaco masticable durante ?  al menos 6 horas antes de la cirug?a. ?   ? __X_ 3. No alcohol for 24 hours before or after surgery. ?   No tome alcohol durante las 24 horas antes ni despu?s de la cirug?a. ? ? __X__4. On the morning of surgery brush your teeth with toothpaste and water, you ?               may rinse your mouth with mouthwash if you wish.   Do not swallow any toothpaste of mouthwash. ?  En la ma?ana de la cirug?a, cep?llese los dientes con pasta de dientes y agua, ?               puede enjuagarse la boca con enjuague bucal si lo desea. No ingiera ninguna pasta de dientes o enjuague bucal. ? ? __X__ 5. Notify your doctor if there is any change in your medical condition (cold,fever, infections). ?   Informe a su m?dico si hay alg?n cambio en su condici?n m?dica  ?(resfriado, fiebre, infecciones). ? ? Do not wear jewelry, make-up, hairpins, clips or nail polish. ? No use joyas, maquillajes, pinzas/ganchos para el cabello ni esmalte de u?as. ? Do not wear lotions, powders, or perfumes. You may wear deodorant. ? No use lociones, polvos o perfumes.  Puede usar desodorante.   ? Do not shave 48 hours prior to surgery. Men may shave face and neck. ? No se afeite 48 horas antes de la cirug?a.  Los hombres pueden [employer] cara  y el cuello.  ? Do not bring valuables to the hospital.   ?No lleve objetos de valor al hospital. ? [facility] is not responsible for any belongings or valuables. ? [facility] no se hace responsable de ning?n tipo de pertenencias u objetos de Geographical information systems officer. ?  ?            Contacts, dentures or bridgework may not be worn into surgery. ? Los lentes de contacto, las dentaduras postizas o puentes no se pueden usar en la cirug?a. ?  ?Leave your suitcase in the car. After surgery it may be brought to your room. ? Deje su maleta en el auto.  Despu?s de la cirug?a podr? traerla a su habitaci?n. ?  ?For patients admitted to the hospital, discharge time is determined by your ? treatment team. ? Para los pacientes que sean ingresados al hospital, el tiempo en el cual se le ? dar? de alta es determinado por su equipo de tratamiento. ?  ?Patients discharged the day of surgery will not be allowed to drive home. ?A los pacientes que se les da de alta el mismo d?a de la cirug?a no se les permitir? conducir a casa. ? ? ?__X__ Take these medicines the  morning of surgery with A SIP OF WATER: ?         Tome estas medicinas la ma?ana de la cirug?a con UN SORBO DE AGUA: ? 1. amLODipine (NORVASC) 5 MG ? 2. levothyroxine (SYNTHROID, LEVOTHROID) 200 MCG ? 3. gabapentin (NEURONTIN) 200 MG  ? 4.     ? 5. ? 6. ? ?____ Fleet Enema (as directed) ?         Enema de Fleet (seg?n lo indicado)   ? ?__X__ Use CHG Soap as directed ?         Utilice el jab?n de CHG seg?n lo indicado ? ?____ Use inhalers on the day of surgery ?         Use los inhaladores el d?a de la cirug?a ? ?__X__ Stop metFORMIN (GLUCOPHAGE) 500 MG 2 days prior to surgery (last  ?         Deje de tomar el metFORMIN (GLUCOPHAGE) 500 MG 2 d?as antes de la cirug?a   ? ?____ Take 1/2 of usual insulin dose the night before surgery and none on the morning of surgery  ?         Tome la mitad de la dosis habitual de insulina la noche antes de la cirug?a y no tome nada en la ma?ana de la  ?           cirug?a ? ?__X__ Stop Anti-inflammatories such as Ibuprofen, Aleve, Advil, naproxen, Motrin, aspirin, Goody's and or BC powders. ?         Deje de tomar antiinflamatorios como Ibuprofen, Aleve, Advil, naproxen, Motrin, aspirin, Goody's and or BC powders. ?  ?__X__ Stop ALL supplements until after surgery AZO-CRANBERRY and Melatonin   ?         Deje de tomar suplementos hasta despu?s de la cirug?a AZO-CRANBERRY and Melatonin   ? ?____ Bring C-Pap to the hospital ?         [place] al hospital  ? ?

## 2021-06-07 ENCOUNTER — Other Ambulatory Visit
Admission: RE | Admit: 2021-06-07 | Discharge: 2021-06-07 | Disposition: A | Discharge status: Home / Self Care | Payer: Medicare Other | Source: Ambulatory Visit | Attending: General Surgery | Admitting: General Surgery

## 2021-06-07 ENCOUNTER — Other Ambulatory Visit: Payer: Self-pay

## 2021-06-07 DIAGNOSIS — I10 Essential (primary) hypertension: Secondary | ICD-10-CM | POA: Diagnosis not present

## 2021-06-07 DIAGNOSIS — Z01810 Encounter for preprocedural cardiovascular examination: Secondary | ICD-10-CM | POA: Diagnosis present

## 2021-06-11 MED ORDER — CEFAZOLIN SODIUM-DEXTROSE 2-4 GM/100ML-% IV SOLN: 2.0000 g | INTRAVENOUS | Status: DC

## 2021-06-11 MED ORDER — SODIUM CHLORIDE 0.9 % IV SOLN
INTRAVENOUS | Status: DC
Start: 2021-06-11 — End: 2021-06-12

## 2021-06-11 MED ORDER — INDOCYANINE GREEN 25 MG IV SOLR
1.2500 mg | Freq: Once | INTRAVENOUS | Status: AC
  Administered 2021-06-12: 1.25 mg via INTRAVENOUS
  Filled 2021-06-11: qty 0.5

## 2021-06-11 MED ORDER — ORAL CARE MOUTH RINSE: 15.0000 mL | Freq: Once | OROMUCOSAL | Status: AC

## 2021-06-11 MED ORDER — FAMOTIDINE 20 MG PO TABS: 20.0000 mg | ORAL_TABLET | Freq: Once | ORAL | Status: AC

## 2021-06-11 MED ORDER — CHLORHEXIDINE GLUCONATE 0.12 % MT SOLN: 15.0000 mL | Freq: Once | OROMUCOSAL | Status: AC

## 2021-06-12 ENCOUNTER — Ambulatory Visit
Admission: RE | Admit: 2021-06-12 | Discharge: 2021-06-12 | Disposition: A | Discharge status: Home / Self Care | Payer: Medicare Other | Source: Home / Self Care | Attending: General Surgery | Admitting: General Surgery

## 2021-06-12 ENCOUNTER — Ambulatory Visit: Payer: Medicare Other | Admitting: Certified Registered"

## 2021-06-12 ENCOUNTER — Ambulatory Visit: Payer: Medicare Other

## 2021-06-12 ENCOUNTER — Other Ambulatory Visit: Payer: Self-pay

## 2021-06-12 ENCOUNTER — Encounter
Admission: RE | Disposition: A | Discharge status: Home / Self Care | Payer: Self-pay | Source: Home / Self Care | Attending: General Surgery

## 2021-06-12 ENCOUNTER — Encounter: Payer: Self-pay | Admitting: General Surgery

## 2021-06-12 DIAGNOSIS — Z9221 Personal history of antineoplastic chemotherapy: Secondary | ICD-10-CM | POA: Insufficient documentation

## 2021-06-12 DIAGNOSIS — Z1501 Genetic susceptibility to malignant neoplasm of breast: Secondary | ICD-10-CM | POA: Insufficient documentation

## 2021-06-12 DIAGNOSIS — Z8541 Personal history of malignant neoplasm of cervix uteri: Secondary | ICD-10-CM | POA: Insufficient documentation

## 2021-06-12 DIAGNOSIS — T8144XA Sepsis following a procedure, initial encounter: Secondary | ICD-10-CM | POA: Diagnosis not present

## 2021-06-12 DIAGNOSIS — E039 Hypothyroidism, unspecified: Secondary | ICD-10-CM | POA: Insufficient documentation

## 2021-06-12 DIAGNOSIS — R748 Abnormal levels of other serum enzymes: Secondary | ICD-10-CM | POA: Insufficient documentation

## 2021-06-12 DIAGNOSIS — Z923 Personal history of irradiation: Secondary | ICD-10-CM | POA: Insufficient documentation

## 2021-06-12 DIAGNOSIS — K811 Chronic cholecystitis: Secondary | ICD-10-CM | POA: Insufficient documentation

## 2021-06-12 DIAGNOSIS — Z853 Personal history of malignant neoplasm of breast: Secondary | ICD-10-CM | POA: Insufficient documentation

## 2021-06-12 DIAGNOSIS — R1011 Right upper quadrant pain: Secondary | ICD-10-CM | POA: Diagnosis not present

## 2021-06-12 DIAGNOSIS — I10 Essential (primary) hypertension: Secondary | ICD-10-CM | POA: Insufficient documentation

## 2021-06-12 HISTORY — PX: INTRAOPERATIVE CHOLANGIOGRAM: SHX5230

## 2021-06-12 LAB — GLUCOSE, CAPILLARY: Glucose-Capillary: 77 mg/dL (ref 70–99)

## 2021-06-12 SURGERY — CHOLECYSTECTOMY, ROBOT-ASSISTED, LAPAROSCOPIC
Anesthesia: General | Site: Abdomen

## 2021-06-12 MED ORDER — PROPOFOL 10 MG/ML IV BOLUS
INTRAVENOUS | Status: DC | PRN
  Administered 2021-06-12: 150 mg via INTRAVENOUS

## 2021-06-12 MED ORDER — CHLORHEXIDINE GLUCONATE 0.12 % MT SOLN
OROMUCOSAL | Status: AC
  Administered 2021-06-12: 15 mL via OROMUCOSAL
  Filled 2021-06-12: qty 15

## 2021-06-12 MED ORDER — GLYCOPYRROLATE 0.2 MG/ML IJ SOLN
INTRAMUSCULAR | Status: DC | PRN
  Administered 2021-06-12: .2 mg via INTRAVENOUS

## 2021-06-12 MED ORDER — PHENYLEPHRINE HCL (PRESSORS) 10 MG/ML IV SOLN
INTRAVENOUS | Status: DC | PRN
  Administered 2021-06-12: 80 ug via INTRAVENOUS
  Administered 2021-06-12 (×2): 160 ug via INTRAVENOUS

## 2021-06-12 MED ORDER — SUGAMMADEX SODIUM 200 MG/2ML IV SOLN
INTRAVENOUS | Status: DC | PRN
  Administered 2021-06-12: 200 mg via INTRAVENOUS

## 2021-06-12 MED ORDER — CEFAZOLIN SODIUM-DEXTROSE 2-4 GM/100ML-% IV SOLN
INTRAVENOUS | Status: AC
  Filled 2021-06-12: qty 100

## 2021-06-12 MED ORDER — METOPROLOL TARTRATE 5 MG/5ML IV SOLN: 5.0000 mg | INTRAVENOUS | Status: DC | PRN

## 2021-06-12 MED ORDER — ONDANSETRON HCL 4 MG/2ML IJ SOLN: 4.0000 mg | Freq: Once | INTRAMUSCULAR | Status: DC | PRN

## 2021-06-12 MED ORDER — ROCURONIUM BROMIDE 100 MG/10ML IV SOLN
INTRAVENOUS | Status: DC | PRN
Start: 2021-06-12 — End: 2021-06-12
  Administered 2021-06-12: 50 mg via INTRAVENOUS

## 2021-06-12 MED ORDER — TRAMADOL HCL 50 MG PO TABS
50.0000 mg | ORAL_TABLET | Freq: Four times a day (QID) | ORAL | Status: DC
  Administered 2021-06-12: 50 mg via ORAL

## 2021-06-12 MED ORDER — FAMOTIDINE 20 MG PO TABS
ORAL_TABLET | ORAL | Status: AC
  Administered 2021-06-12: 20 mg via ORAL
  Filled 2021-06-12: qty 1

## 2021-06-12 MED ORDER — MIDAZOLAM HCL 2 MG/2ML IJ SOLN
INTRAMUSCULAR | Status: AC
  Filled 2021-06-12: qty 2

## 2021-06-12 MED ORDER — METOPROLOL TARTRATE 5 MG/5ML IV SOLN
INTRAVENOUS | Status: AC
  Administered 2021-06-12: 5 mg via INTRAVENOUS
  Filled 2021-06-12: qty 5

## 2021-06-12 MED ORDER — FENTANYL CITRATE (PF) 100 MCG/2ML IJ SOLN
INTRAMUSCULAR | Status: DC | PRN
  Administered 2021-06-12: 50 ug via INTRAVENOUS

## 2021-06-12 MED ORDER — KETOROLAC TROMETHAMINE 30 MG/ML IJ SOLN
INTRAMUSCULAR | Status: DC | PRN
  Administered 2021-06-12: 30 mg via INTRAVENOUS

## 2021-06-12 MED ORDER — ESMOLOL HCL 100 MG/10ML IV SOLN
INTRAVENOUS | Status: DC | PRN
Start: 2021-06-12 — End: 2021-06-12
  Administered 2021-06-12 (×2): 2 mg via INTRAVENOUS

## 2021-06-12 MED ORDER — ONDANSETRON HCL 4 MG/2ML IJ SOLN
INTRAMUSCULAR | Status: DC | PRN
  Administered 2021-06-12: 4 mg via INTRAVENOUS

## 2021-06-12 MED ORDER — BUPIVACAINE-EPINEPHRINE (PF) 0.25% -1:200000 IJ SOLN
INTRAMUSCULAR | Status: AC
  Filled 2021-06-12: qty 30

## 2021-06-12 MED ORDER — LACTATED RINGERS IV SOLN: INTRAVENOUS | Status: DC | PRN

## 2021-06-12 MED ORDER — SEVOFLURANE IN SOLN
RESPIRATORY_TRACT | Status: AC
  Filled 2021-06-12: qty 250

## 2021-06-12 MED ORDER — FENTANYL CITRATE (PF) 100 MCG/2ML IJ SOLN
INTRAMUSCULAR | Status: AC
  Filled 2021-06-12: qty 2

## 2021-06-12 MED ORDER — FENTANYL CITRATE (PF) 100 MCG/2ML IJ SOLN
25.0000 ug | INTRAMUSCULAR | Status: DC | PRN
  Administered 2021-06-12 (×2): 25 ug via INTRAVENOUS

## 2021-06-12 MED ORDER — LIDOCAINE HCL (CARDIAC) PF 100 MG/5ML IV SOSY
PREFILLED_SYRINGE | INTRAVENOUS | Status: DC | PRN
  Administered 2021-06-12: 100 mg via INTRAVENOUS

## 2021-06-12 MED ORDER — TRAMADOL HCL 50 MG PO TABS: 50.0000 mg | ORAL_TABLET | Freq: Four times a day (QID) | ORAL | 0 refills | Status: AC | PRN

## 2021-06-12 MED ORDER — MIDAZOLAM HCL 2 MG/2ML IJ SOLN
INTRAMUSCULAR | Status: DC | PRN
  Administered 2021-06-12: 2 mg via INTRAVENOUS

## 2021-06-12 MED ORDER — TRAMADOL HCL 50 MG PO TABS
ORAL_TABLET | ORAL | Status: AC
  Filled 2021-06-12: qty 1

## 2021-06-12 MED ORDER — BUPIVACAINE-EPINEPHRINE 0.25% -1:200000 IJ SOLN
INTRAMUSCULAR | Status: DC | PRN
  Administered 2021-06-12: 30 mL

## 2021-06-12 MED ORDER — SODIUM CHLORIDE 0.9 % IV SOLN
INTRAVENOUS | Status: DC | PRN
  Administered 2021-06-12: 24 mL

## 2021-06-12 MED ORDER — DEXAMETHASONE SODIUM PHOSPHATE 10 MG/ML IJ SOLN
INTRAMUSCULAR | Status: DC | PRN
  Administered 2021-06-12: 10 mg via INTRAVENOUS

## 2021-06-12 MED ORDER — EPHEDRINE SULFATE (PRESSORS) 50 MG/ML IJ SOLN
INTRAMUSCULAR | Status: DC | PRN
  Administered 2021-06-12: 10 mg via INTRAVENOUS

## 2021-06-12 SURGICAL SUPPLY — 55 items
ADH SKN CLS APL DERMABOND .7 (GAUZE/BANDAGES/DRESSINGS) ×2
BAG INFUSER PRESSURE 100CC (MISCELLANEOUS) IMPLANT
BAG RETRIEVAL 10 (BASKET) ×1
BLADE SURG SZ11 CARB STEEL (BLADE) ×3 IMPLANT
CANNULA REDUC XI 12-8 STAPL (CANNULA) ×1
CANNULA REDUCER 12-8 DVNC XI (CANNULA) ×2 IMPLANT
CATH REDDICK CHOLANGI 4FR 50CM (CATHETERS) ×1 IMPLANT
CLIP LIGATING HEM O LOK PURPLE (MISCELLANEOUS) IMPLANT
CLIP LIGATING HEMO O LOK GREEN (MISCELLANEOUS) ×3 IMPLANT
DERMABOND ADVANCED (GAUZE/BANDAGES/DRESSINGS) ×1
DERMABOND ADVANCED .7 DNX12 (GAUZE/BANDAGES/DRESSINGS) ×2 IMPLANT
DRAPE ARM DVNC X/XI (DISPOSABLE) ×8 IMPLANT
DRAPE C-ARM XRAY 36X54 (DRAPES) ×1 IMPLANT
DRAPE COLUMN DVNC XI (DISPOSABLE) ×2 IMPLANT
DRAPE DA VINCI XI ARM (DISPOSABLE) ×4
DRAPE DA VINCI XI COLUMN (DISPOSABLE) ×1
ELECT REM PT RETURN 9FT ADLT (ELECTROSURGICAL) ×3
ELECTRODE REM PT RTRN 9FT ADLT (ELECTROSURGICAL) ×2 IMPLANT
GLOVE SURG ENC MOIS LTX SZ6.5 (GLOVE) ×8 IMPLANT
GLOVE SURG UNDER POLY LF SZ6.5 (GLOVE) ×8 IMPLANT
GOWN STRL REUS W/ TWL LRG LVL3 (GOWN DISPOSABLE) ×6 IMPLANT
GOWN STRL REUS W/TWL LRG LVL3 (GOWN DISPOSABLE) ×12
GRASPER SUT TROCAR 14GX15 (MISCELLANEOUS) ×3 IMPLANT
IRRIGATOR SUCT 8 DISP DVNC XI (IRRIGATION / IRRIGATOR) IMPLANT
IRRIGATOR SUCTION 8MM XI DISP (IRRIGATION / IRRIGATOR)
IV CATH ANGIO 12GX3 LT BLUE (NEEDLE) ×1 IMPLANT
IV NS 1000ML (IV SOLUTION)
IV NS 1000ML BAXH (IV SOLUTION) IMPLANT
KIT PINK PAD W/HEAD ARE REST (MISCELLANEOUS) ×3
KIT PINK PAD W/HEAD ARM REST (MISCELLANEOUS) ×2 IMPLANT
LABEL OR SOLS (LABEL) ×3 IMPLANT
MANIFOLD NEPTUNE II (INSTRUMENTS) ×3 IMPLANT
NDL INSUFFLATION 14GA 120MM (NEEDLE) ×2 IMPLANT
NEEDLE HYPO 22GX1.5 SAFETY (NEEDLE) ×3 IMPLANT
NEEDLE INSUFFLATION 14GA 120MM (NEEDLE) ×3 IMPLANT
NS IRRIG 500ML POUR BTL (IV SOLUTION) ×3 IMPLANT
OBTURATOR OPTICAL STANDARD 8MM (TROCAR) ×1
OBTURATOR OPTICAL STND 8 DVNC (TROCAR) ×2
OBTURATOR OPTICALSTD 8 DVNC (TROCAR) ×2 IMPLANT
PACK LAP CHOLECYSTECTOMY (MISCELLANEOUS) ×3 IMPLANT
SEAL CANN UNIV 5-8 DVNC XI (MISCELLANEOUS) ×6 IMPLANT
SEAL XI 5MM-8MM UNIVERSAL (MISCELLANEOUS) ×3
SET TUBE SMOKE EVAC HIGH FLOW (TUBING) ×3 IMPLANT
SOLUTION ELECTROLUBE (MISCELLANEOUS) ×3 IMPLANT
SPIKE FLUID TRANSFER (MISCELLANEOUS) ×6 IMPLANT
SPONGE T-LAP 4X18 ~~LOC~~+RFID (SPONGE) ×1 IMPLANT
STAPLER CANNULA SEAL DVNC XI (STAPLE) ×2 IMPLANT
STAPLER CANNULA SEAL XI (STAPLE) ×1
SUT MNCRL 4-0 (SUTURE) ×3
SUT MNCRL 4-0 27XMFL (SUTURE) ×2
SUT VICRYL 0 AB UR-6 (SUTURE) ×3 IMPLANT
SUTURE MNCRL 4-0 27XMF (SUTURE) ×2 IMPLANT
SYS BAG RETRIEVAL 10MM (BASKET) ×2
SYSTEM BAG RETRIEVAL 10MM (BASKET) ×2 IMPLANT
WATER STERILE IRR 500ML POUR (IV SOLUTION) ×3 IMPLANT

## 2021-06-12 NOTE — Anesthesia Preprocedure Evaluation (Signed)
Anesthesia Evaluation  ?Patient identified by MRN, date of birth, ID band ?Patient awake ? ? ? ?Reviewed: ?Allergy & Precautions, H&P , NPO status , Patient's Chart, lab work & pertinent test results, reviewed documented beta blocker date and time  ? ?History of Anesthesia Complications ?(+) PONV and history of anesthetic complications ? ?Airway ?Mallampati: II ? ?TM Distance: >3 FB ?Neck ROM: full ? ? ? Dental ? ?(+) Teeth Intact ?  ?Pulmonary ?neg pulmonary ROS,  ?  ?Pulmonary exam normal ? ? ? ? ? ? ? Cardiovascular ?Exercise Tolerance: Poor ?hypertension, On Medications ?negative cardio ROS ?Normal cardiovascular exam ?Rhythm:regular Rate:Normal ? ? ?  ?Neuro/Psych ?Anxiety negative neurological ROS ? negative psych ROS  ? GI/Hepatic ?negative GI ROS, Neg liver ROS,   ?Endo/Other  ?Hypothyroidism  ? Renal/GU ?negative Renal ROS  ?negative genitourinary ?  ?Musculoskeletal ? ? Abdominal ?  ?Peds ? 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    Comment:  pt and 2 daughters are positive ?07/2016: Breast cancer ([place]) ?    Comment:  right breast lumpectomy with chemo and mammosite ?08/2016: Breast cancer of upper-outer quadrant of right female breast  ?(HCC) ?    Comment:  1.0 cm ER+, PR-, Her 2 neu not overexpressed. Node  ?             negative. T1b, N0. ?1990: Cervical cancer ([place]) ?No date: Hypertension ?No date: Hypothyroidism ?11/2016-12/2016: Personal history of chemotherapy ?    Comment:  right breast ca ?09/15/2016: Personal history of radiation therapy ?    Comment:  mammosite ?No date: PONV (postoperative nausea and vomiting) ?No date: Pre-diabetes ?No date: Thyroid disease ?No date: Urinary tract infection ?Past Surgical History: ?No date: ABDOMINAL HYSTERECTOMY ?08/08/2016: BREAST BIOPSY; Right ?    Comment:  invasive mammary carcinoma ?06/01/2019: BREAST BIOPSY; Right ?     Comment:  bx neg and abbcess drainage ?08/28/2016: BREAST LUMPECTOMY; Right ?    Comment:  invasive mammary carcinoma, clear margins, negative  ?             lymph nodes ?08/28/2016: BREAST LUMPECTOMY WITH SENTINEL LYMPH NODE BIOPSY; Right ?    Comment:  Procedure: BREAST LUMPECTOMY WITH SENTINEL LYMPH NODE  ?             BX;  Surgeon: [name], MD;  Location: [facility]  ?             ORS;  Service: General;  Laterality: Right; ?09/15/2016: BREAST MAMMOSITE; Right ?    Comment:  Procedure: MAMMOSITE BREAST;  Surgeon: Hervey Ard  ?             W, MD;  Location: [facility] ORS;  Service: General;   ?             Laterality: Right; ?No date: BREAST SURGERY; Right ?    Comment:  Breast Biopsy ?No date: CERVIX SURGERY ?2008: COLONOSCOPY ?04/26/2020: COLONOSCOPY WITH PROPOFOL; N/A ?    Comment:  Procedure: COLONOSCOPY WITH PROPOFOL;  Surgeon: Vicente Males,  ?             Bailey Mech, MD;  Location: [place];  Service:  ?             Gastroenterology;  Laterality: N/A; ?04/26/2020: ESOPHAGOGASTRODUODENOSCOPY (EGD) WITH PROPOFOL; N/A ?    Comment:  Procedure: ESOPHAGOGASTRODUODENOSCOPY (EGD) WITH  ?  PROPOFOL;  Surgeon: Jonathon Bellows, MD;  Location: [facility]  ?             ENDOSCOPY;  Service: Gastroenterology;  Laterality: N/A; ?No date: EYE SURGERY; Bilateral ?    Comment:  Cataract Extraction with IOL ?11/20/2016: PORTA CATH INSERTION; Right ?    Comment:  Procedure: PORTA CATH INSERTION;  Surgeon: Bary Castilla,  ?             Forest Gleason, MD;  Location: [facility] ORS;  Service: General;   ?             Laterality: Right; ?2018: PORTA CATH REMOVAL ?07/14/2019: RE-EXCISION OF BREAST LUMPECTOMY; Right ?    Comment:  Procedure: RE-EXCISION OF Right BREAST LUMPECTOMY;   ?             Surgeon: Jules Husbands, MD;  Location: [facility] ORS;   ?             Service: General;  Laterality: Right; ?2012: SHOULDER ARTHROSCOPY; Right ?    Comment:  torn ligaments ? ? Reproductive/Obstetrics ?negative OB ROS ? ?  ? ? ? ? ? ? ? ? ? ? ? ? ? ?  ?   ? ? ? ? ? ? ? ? ?Anesthesia Physical ?Anesthesia Plan ? ?ASA: 3 ? ?Anesthesia Plan: General ETT  ? ?Post-op Pain Management:   ? ?Induction:  ? ?PONV Risk Score and Plan: 4 or greater ? ?Airway Management Planned:  ? ?Additional Equipment:  ? ?Intra-op Plan:  ? ?Post-operative Plan:  ? ?Informed Consent: I have reviewed the patients History and Physical, chart, labs and discussed the procedure including the risks, benefits and alternatives for the proposed anesthesia with the patient or authorized representative who has indicated his/her understanding and acceptance.  ? ? ? ?Dental Advisory Given ? ?Plan Discussed with: CRNA ? ?Anesthesia Plan Comments:   ? ? ? ? ? ? ?Anesthesia Quick Evaluation ? ?

## 2021-06-12 NOTE — Op Note (Signed)
Preoperative diagnosis: Cholelithiasis ? ?Postoperative diagnosis: Cholelithiasis ? ?Procedure: Robotic Assisted Laparoscopic Cholecystectomy with intra operative cholangiogram.  ? ?Anesthesia: GETA ?  ?Surgeon: Dr. Windell Moment ? ?Wound Classification: Clean Contaminated ? ?Indications: Patient is a 66 y.o. female developed right upper quadrant pain and on workup was found to have cholelithiasis with a normal common duct with persistent elevated alkaline phosphatase. Robotic Assisted Laparoscopic cholecystectomy with cholangiogram was elected. ? ?Findings: ?Cirrhotic liver ?No contrast filling defect identified on cholangiogram ?Critical view of safety achieved ?Cystic duct and artery identified, ligated and divided ?Adequate hemostasis ? ? ? ? ?Description of procedure: The patient was placed on the operating table in the supine position. General anesthesia was induced. A time-out was completed verifying correct patient, procedure, site, positioning, and implant(s) and/or special equipment prior to beginning this procedure. An orogastric tube was placed. The abdomen was prepped and draped in the usual sterile fashion.  ?An incision was made in a natural skin line below the umbilicus.  ?The fascia was elevated and the Veress needle inserted. Proper position was confirmed by aspiration and saline meniscus test.  ?The abdomen was insufflated with carbon dioxide to a pressure of 15 mmHg. The patient tolerated insufflation well. A 8-mm trocar was then inserted in optiview fashion.  ?The laparoscope was inserted and the abdomen inspected. No injuries from initial trocar placement were noted. Additional trocars were then inserted in the following locations: an 8-mm trocar in the left lateral abdomen, and another two 8-mm trocars to the right side of the abdomen 5 cm appart. The umbilical trocar was changed to a 12 mm trocar all under direct visualization. The abdomen was inspected and no abnormalities were found. The  table was placed in the reverse Trendelenburg position with the right side up. The robotic arms were docked and target anatomy identified. Instrument inserted under direct visualization.  ?Filmy adhesions between the gallbladder and omentum, duodenum and transverse colon were lysed with electrocautery. The dome of the gallbladder was grasped with a prograsp and retracted over the dome of the liver. The infundibulum was also grasped with an atraumatic grasper and retracted toward the right lower quadrant. This maneuver exposed Calot?s triangle. The peritoneum overlying the gallbladder infundibulum was then incised and the cystic duct and cystic artery identified and circumferentially dissected. Critical view of safety reviewed before ligating any structure. Firefly images taken to visualize biliary ducts.  ? ?The cystic duct was ligated close to the infundibulum. A cystotomy was done. Catheter was inserted. Bile was aspirated and saline flushed without resistance. Contrast was then flushed and fluoroscopy images were reviewed. No contrast extravasation or filling defect was identified. Catheter removed.   ?The infundibulum was then grasped again with atraumatic grasper ?The cystic duct and cystic artery were then doubly clipped and divided close to the gallbladder.  ?The gallbladder was then dissected from its peritoneal attachments by electrocautery. Hemostasis was checked and the gallbladder and contained stones were removed using an endoscopic retrieval bag. The gallbladder was passed off the table as a specimen.  There was no evidence of bleeding from the gallbladder fossa or cystic artery or leakage of the bile from the cystic duct stump. Secondary trocars were removed under direct vision. No bleeding was noted. The robotic arms were undoked. The scope was withdrawn and the umbilical trocar removed. The abdomen was allowed to collapse. The fascia of the 14m trocar sites was closed with figure-of-eight 0 vicryl  sutures. The skin was closed with subcuticular sutures of 4-0 monocryl and  topical skin adhesive. The orogastric tube was removed.  ?The patient tolerated the procedure well and was taken to the postanesthesia care unit in stable condition.  ? ?Specimen: Gallbladder ? ?Complications: None ? ?EBL: 5 mL ? ?

## 2021-06-12 NOTE — Transfer of Care (Signed)
Immediate Anesthesia Transfer of Care Note ? ?Patient: Amir Glaus ? ?Procedure(s) Performed: XI ROBOTIC ASSISTED LAPAROSCOPIC CHOLECYSTECTOMY -- w / chlangiogram (Abdomen) ?INDOCYANINE GREEN FLUORESCENCE IMAGING (ICG) ?INTRAOPERATIVE CHOLANGIOGRAM ? ?Patient Location: PACU ? ?Anesthesia Type:General ? ?Level of Consciousness: drowsy ? ?Airway & Oxygen Therapy: Patient Spontanous Breathing and Patient connected to face mask oxygen ? ?Post-op Assessment: Report given to RN and Post -op Vital signs reviewed and stable ? ?Post vital signs: Reviewed and stable ? ?Last Vitals:  ?Vitals Value Taken Time  ?BP    ?Temp    ?Pulse    ?Resp    ?SpO2    ? ? ?Last Pain:  ?Vitals:  ? 06/12/21 0810  ?TempSrc: Temporal  ?PainSc: 2   ?   ? ?  ? ?Complications: No notable events documented. ?

## 2021-06-12 NOTE — Anesthesia Procedure Notes (Signed)
Procedure Name: Intubation ?Date/Time: 06/12/2021 9:18 AM ?Performed by: Beverely Low, CRNA ?Pre-anesthesia Checklist: Patient identified, Patient being monitored, Timeout performed, Emergency Drugs available and Suction available ?Patient Re-evaluated:Patient Re-evaluated prior to induction ?Oxygen Delivery Method: Circle system utilized ?Preoxygenation: Pre-oxygenation with 100% oxygen ?Induction Type: IV induction ?Ventilation: Mask ventilation without difficulty ?Laryngoscope Size: McGraph and 3 ?Grade View: Grade I ?Tube type: Oral ?Tube size: 7.5 mm ?Number of attempts: 1 ?Airway Equipment and Method: Stylet ?Placement Confirmation: ETT inserted through vocal cords under direct vision, positive ETCO2 and breath sounds checked- equal and bilateral ?Secured at: 21 cm ?Tube secured with: Tape ?Dental Injury: Teeth and Oropharynx as per pre-operative assessment  ? ? ? ? ?

## 2021-06-12 NOTE — Interval H&P Note (Signed)
History and Physical Interval Note: ? ?06/12/2021 ?8:20 AM ? ?Wanda Reed  has presented today for surgery, with the diagnosis of K80.20 cholelithiasis w/o cholecystitis.  The various methods of treatment have been discussed with the patient and family. After consideration of risks, benefits and other options for treatment, the patient has consented to  Procedure(s): ?XI ROBOTIC ASSISTED LAPAROSCOPIC CHOLECYSTECTOMY -- w / chlangiogram (N/A) ?INDOCYANINE GREEN FLUORESCENCE IMAGING (ICG) (N/A) ?INTRAOPERATIVE CHOLANGIOGRAM (N/A) as a surgical intervention.  The patient's history has been reviewed, patient examined, no change in status, stable for surgery.  I have reviewed the patient's chart and labs.  Questions were answered to the patient's satisfaction.   ? ? ?Wanda Reed ? ? ?

## 2021-06-12 NOTE — Progress Notes (Signed)
Dr Andree Elk aware of EKG results and HR dowm to 65 with NSR on monitor ?

## 2021-06-12 NOTE — Discharge Instructions (Addendum)
?  Diet: Resume home heart healthy regular diet.  ? ?Activity: No heavy lifting >20 pounds (children, pets, laundry, garbage) or strenuous activity until follow-up, but light activity and walking are encouraged. Do not drive or drink alcohol if taking narcotic pain medications. ? ?Wound care: May shower with soapy water and pat dry (do not rub incisions), but no baths or submerging incision underwater until follow-up. (no swimming)  ? ?Medications: Resume all home medications. For mild to moderate pain: acetaminophen (Tylenol) or ibuprofen (if no kidney disease). Combining Tylenol with alcohol can substantially increase your risk of causing liver disease. Narcotic pain medications, if prescribed, can be used for severe pain, though may cause nausea, constipation, and drowsiness. If you do not need the narcotic pain medication, you do not need to fill the prescription. ? ?Call office [phone]) at any time if any questions, worsening pain, fevers/chills, bleeding, drainage from incision site, or other concerns. ?Dieta: Reanudar la dieta regular saludable para el coraz?n en casa. ? ?Actividad: no levante objetos pesados de m?s de 20 libras (ni?os, mascotas, ropa para lavar, basura) o actividad extenuante [employer], pero se recomienda la actividad ligera y Writer. No conduzca ni beba alcohol si toma analg?sicos narc?ticos. ? ?Cuidado de heridas: puede ducharse con [place] y secarse con palmaditas (no frotar las incisiones), pero no ba?arse ni sumergir la incisi?n bajo el Orthoptist. (sin nadar) ? ?Medicamentos: Reanude todos los [employer]. Para el dolor leve a moderado: paracetamol (Tylenol) o ibuprofeno (si no hay enfermedad renal). La combinaci?n de Tylenol con alcohol puede aumentar sustancialmente el riesgo de causar una enfermedad hep?tica. Los analg?sicos narc?ticos, si se recetan, se pueden usar para el dolor intenso, aunque pueden causar n?useas, estre?imiento y  somnolencia. Si no necesita el analg?sico narc?tico, no necesita surtir Public house manager. ? ?Llame a la oficina [phone]) en cualquier momento si tiene alguna pregunta, empeora el dolor, fiebre/escalofr?os, sangrado, drenaje del sitio de la incisi?n u otras inquietudes. ? ? ? ? ?

## 2021-06-13 LAB — SURGICAL PATHOLOGY

## 2021-06-14 ENCOUNTER — Inpatient Hospital Stay: Payer: Medicare Other

## 2021-06-14 ENCOUNTER — Inpatient Hospital Stay
Admission: EM | Admit: 2021-06-14 | Discharge: 2021-06-15 | DRG: 856 | Disposition: E | Payer: Medicare Other | Attending: Pulmonary Disease | Admitting: Pulmonary Disease

## 2021-06-14 ENCOUNTER — Emergency Department: Payer: Medicare Other

## 2021-06-14 ENCOUNTER — Inpatient Hospital Stay: Payer: Medicare Other | Admitting: Anesthesiology

## 2021-06-14 ENCOUNTER — Encounter: Payer: Self-pay | Admitting: General Surgery

## 2021-06-14 ENCOUNTER — Inpatient Hospital Stay
Admit: 2021-06-14 | Discharge: 2021-06-14 | Disposition: A | Discharge status: Home / Self Care | Payer: Medicare Other | Attending: Cardiovascular Disease | Admitting: Cardiovascular Disease

## 2021-06-14 ENCOUNTER — Encounter: Admission: EM | Disposition: E | Payer: Self-pay | Source: Home / Self Care | Attending: Pulmonary Disease

## 2021-06-14 DIAGNOSIS — I700 Atherosclerosis of aorta: Secondary | ICD-10-CM | POA: Diagnosis not present

## 2021-06-14 DIAGNOSIS — E119 Type 2 diabetes mellitus without complications: Secondary | ICD-10-CM | POA: Diagnosis present

## 2021-06-14 DIAGNOSIS — I2109 ST elevation (STEMI) myocardial infarction involving other coronary artery of anterior wall: Secondary | ICD-10-CM | POA: Diagnosis not present

## 2021-06-14 DIAGNOSIS — Z923 Personal history of irradiation: Secondary | ICD-10-CM

## 2021-06-14 DIAGNOSIS — F419 Anxiety disorder, unspecified: Secondary | ICD-10-CM | POA: Diagnosis present

## 2021-06-14 DIAGNOSIS — I447 Left bundle-branch block, unspecified: Secondary | ICD-10-CM | POA: Diagnosis present

## 2021-06-14 DIAGNOSIS — R079 Chest pain, unspecified: Secondary | ICD-10-CM

## 2021-06-14 DIAGNOSIS — Z8541 Personal history of malignant neoplasm of cervix uteri: Secondary | ICD-10-CM | POA: Diagnosis not present

## 2021-06-14 DIAGNOSIS — J9602 Acute respiratory failure with hypercapnia: Secondary | ICD-10-CM | POA: Diagnosis present

## 2021-06-14 DIAGNOSIS — T8144XA Sepsis following a procedure, initial encounter: Secondary | ICD-10-CM | POA: Diagnosis present

## 2021-06-14 DIAGNOSIS — Z79811 Long term (current) use of aromatase inhibitors: Secondary | ICD-10-CM

## 2021-06-14 DIAGNOSIS — K7469 Other cirrhosis of liver: Secondary | ICD-10-CM | POA: Diagnosis present

## 2021-06-14 DIAGNOSIS — E872 Acidosis, unspecified: Secondary | ICD-10-CM

## 2021-06-14 DIAGNOSIS — Z66 Do not resuscitate: Secondary | ICD-10-CM | POA: Diagnosis not present

## 2021-06-14 DIAGNOSIS — Z5309 Procedure and treatment not carried out because of other contraindication: Secondary | ICD-10-CM | POA: Diagnosis present

## 2021-06-14 DIAGNOSIS — R6520 Severe sepsis without septic shock: Secondary | ICD-10-CM

## 2021-06-14 DIAGNOSIS — E871 Hypo-osmolality and hyponatremia: Secondary | ICD-10-CM | POA: Diagnosis present

## 2021-06-14 DIAGNOSIS — I10 Essential (primary) hypertension: Secondary | ICD-10-CM | POA: Diagnosis present

## 2021-06-14 DIAGNOSIS — N179 Acute kidney failure, unspecified: Secondary | ICD-10-CM | POA: Diagnosis present

## 2021-06-14 DIAGNOSIS — Z7984 Long term (current) use of oral hypoglycemic drugs: Secondary | ICD-10-CM | POA: Diagnosis not present

## 2021-06-14 DIAGNOSIS — R6521 Severe sepsis with septic shock: Secondary | ICD-10-CM | POA: Diagnosis present

## 2021-06-14 DIAGNOSIS — A419 Sepsis, unspecified organism: Secondary | ICD-10-CM

## 2021-06-14 DIAGNOSIS — Z79890 Hormone replacement therapy (postmenopausal): Secondary | ICD-10-CM

## 2021-06-14 DIAGNOSIS — I9589 Other hypotension: Secondary | ICD-10-CM | POA: Diagnosis not present

## 2021-06-14 DIAGNOSIS — E861 Hypovolemia: Secondary | ICD-10-CM | POA: Diagnosis present

## 2021-06-14 DIAGNOSIS — J9601 Acute respiratory failure with hypoxia: Secondary | ICD-10-CM | POA: Diagnosis present

## 2021-06-14 DIAGNOSIS — K8020 Calculus of gallbladder without cholecystitis without obstruction: Secondary | ICD-10-CM | POA: Diagnosis present

## 2021-06-14 DIAGNOSIS — J9811 Atelectasis: Secondary | ICD-10-CM | POA: Diagnosis present

## 2021-06-14 DIAGNOSIS — A4153 Sepsis due to Serratia: Secondary | ICD-10-CM | POA: Diagnosis present

## 2021-06-14 DIAGNOSIS — R571 Hypovolemic shock: Secondary | ICD-10-CM | POA: Diagnosis present

## 2021-06-14 DIAGNOSIS — R570 Cardiogenic shock: Secondary | ICD-10-CM | POA: Diagnosis present

## 2021-06-14 DIAGNOSIS — Z9221 Personal history of antineoplastic chemotherapy: Secondary | ICD-10-CM | POA: Diagnosis not present

## 2021-06-14 DIAGNOSIS — Z853 Personal history of malignant neoplasm of breast: Secondary | ICD-10-CM | POA: Diagnosis not present

## 2021-06-14 DIAGNOSIS — Z79899 Other long term (current) drug therapy: Secondary | ICD-10-CM

## 2021-06-14 DIAGNOSIS — Z20822 Contact with and (suspected) exposure to covid-19: Secondary | ICD-10-CM | POA: Diagnosis present

## 2021-06-14 DIAGNOSIS — R9431 Abnormal electrocardiogram [ECG] [EKG]: Secondary | ICD-10-CM

## 2021-06-14 DIAGNOSIS — E039 Hypothyroidism, unspecified: Secondary | ICD-10-CM | POA: Diagnosis present

## 2021-06-14 DIAGNOSIS — Z170 Estrogen receptor positive status [ER+]: Secondary | ICD-10-CM

## 2021-06-14 DIAGNOSIS — G8918 Other acute postprocedural pain: Secondary | ICD-10-CM

## 2021-06-14 DIAGNOSIS — R579 Shock, unspecified: Principal | ICD-10-CM

## 2021-06-14 DIAGNOSIS — R1011 Right upper quadrant pain: Secondary | ICD-10-CM | POA: Diagnosis present

## 2021-06-14 HISTORY — PX: LAPAROSCOPY: SHX197

## 2021-06-14 LAB — BLOOD GAS, ARTERIAL
Acid-base deficit: 24.2 mmol/L — ABNORMAL HIGH (ref 0.0–2.0)
Bicarbonate: 10 mmol/L — ABNORMAL LOW (ref 20.0–28.0)
FIO2: 100 %
MECHVT: 300 mL
Mechanical Rate: 24
O2 Saturation: 94.8 %
PEEP: 5 cmH2O
Patient temperature: 37
pCO2 arterial: 57 mmHg — ABNORMAL HIGH (ref 32–48)
pH, Arterial: <6.95 — CL (ref 7.35–7.45)
pO2, Arterial: 85 mmHg (ref 83–108)

## 2021-06-14 LAB — LIPID PANEL
Cholesterol: 114 mg/dL (ref 0–200)
HDL: 63 mg/dL (ref >40)
LDL Cholesterol: 26 mg/dL (ref 0–99)
Total CHOL/HDL Ratio: 1.8 RATIO
Triglycerides: 123 mg/dL (ref <150)
VLDL: 25 mg/dL (ref 0–40)

## 2021-06-14 LAB — COMPREHENSIVE METABOLIC PANEL
ALT: 27 U/L (ref 0–44)
ALT: 83 U/L — ABNORMAL HIGH (ref 0–44)
AST: 76 U/L — ABNORMAL HIGH (ref 15–41)
AST: 203 U/L — ABNORMAL HIGH (ref 15–41)
Albumin: 2.9 g/dL — ABNORMAL LOW (ref 3.5–5.0)
Albumin: <1.5 g/dL — ABNORMAL LOW (ref 3.5–5.0)
Alkaline Phosphatase: 82 U/L (ref 38–126)
Alkaline Phosphatase: 71 U/L (ref 38–126)
Anion gap: 25 — ABNORMAL HIGH (ref 5–15)
Anion gap: 22 — ABNORMAL HIGH (ref 5–15)
BUN: 36 mg/dL — ABNORMAL HIGH (ref 8–23)
BUN: 28 mg/dL — ABNORMAL HIGH (ref 8–23)
CO2: 10 mmol/L — ABNORMAL LOW (ref 22–32)
CO2: 29 mmol/L (ref 22–32)
Calcium: 9.2 mg/dL (ref 8.9–10.3)
Calcium: 7.3 mg/dL — ABNORMAL LOW (ref 8.9–10.3)
Chloride: 91 mmol/L — ABNORMAL LOW (ref 98–111)
Chloride: 94 mmol/L — ABNORMAL LOW (ref 98–111)
Creatinine, Ser: 4.13 mg/dL — ABNORMAL HIGH (ref 0.44–1.00)
Creatinine, Ser: 2.61 mg/dL — ABNORMAL HIGH (ref 0.44–1.00)
GFR, Estimated: 11 mL/min — ABNORMAL LOW (ref >60)
GFR, Estimated: 20 mL/min — ABNORMAL LOW (ref >60)
Glucose, Bld: 103 mg/dL — ABNORMAL HIGH (ref 70–99)
Glucose, Bld: 153 mg/dL — ABNORMAL HIGH (ref 70–99)
Potassium: 3.6 mmol/L (ref 3.5–5.1)
Potassium: 3.9 mmol/L (ref 3.5–5.1)
Sodium: 126 mmol/L — ABNORMAL LOW (ref 135–145)
Sodium: 145 mmol/L (ref 135–145)
Total Bilirubin: 1.2 mg/dL (ref 0.3–1.2)
Total Bilirubin: 0.8 mg/dL (ref 0.3–1.2)
Total Protein: 6 g/dL — ABNORMAL LOW (ref 6.5–8.1)
Total Protein: <3 g/dL — ABNORMAL LOW (ref 6.5–8.1)

## 2021-06-14 LAB — CBC WITH DIFFERENTIAL/PLATELET
Abs Immature Granulocytes: 0.05 10*3/uL (ref 0.00–0.07)
Basophils Absolute: 0.1 10*3/uL (ref 0.0–0.1)
Basophils Relative: 1 %
Eosinophils Absolute: 0 10*3/uL (ref 0.0–0.5)
Eosinophils Relative: 0 %
HCT: 48.7 % — ABNORMAL HIGH (ref 36.0–46.0)
Hemoglobin: 15.3 g/dL — ABNORMAL HIGH (ref 12.0–15.0)
Immature Granulocytes: 1 %
Lymphocytes Relative: 16 %
Lymphs Abs: 1.1 10*3/uL (ref 0.7–4.0)
MCH: 29.4 pg (ref 26.0–34.0)
MCHC: 31.4 g/dL (ref 30.0–36.0)
MCV: 93.5 fL (ref 80.0–100.0)
Monocytes Absolute: 0.2 10*3/uL (ref 0.1–1.0)
Monocytes Relative: 3 %
Neutro Abs: 5.5 10*3/uL (ref 1.7–7.7)
Neutrophils Relative %: 79 %
Platelets: 221 10*3/uL (ref 150–400)
RBC: 5.21 MIL/uL — ABNORMAL HIGH (ref 3.87–5.11)
RDW: 14.6 % (ref 11.5–15.5)
WBC Morphology: ABNORMAL
WBC: 6.9 10*3/uL (ref 4.0–10.5)
nRBC: 0 % (ref 0.0–0.2)

## 2021-06-14 LAB — GLUCOSE, CAPILLARY
Glucose-Capillary: 84 mg/dL (ref 70–99)
Glucose-Capillary: 85 mg/dL (ref 70–99)

## 2021-06-14 LAB — RESP PANEL BY RT-PCR (FLU A&B, COVID) ARPGX2
Influenza A by PCR: NEGATIVE
Influenza B by PCR: NEGATIVE
SARS Coronavirus 2 by RT PCR: NEGATIVE

## 2021-06-14 LAB — PREPARE RBC (CROSSMATCH)

## 2021-06-14 LAB — CBC
HCT: 31 % — ABNORMAL LOW (ref 36.0–46.0)
Hemoglobin: 10.1 g/dL — ABNORMAL LOW (ref 12.0–15.0)
MCH: 29.5 pg (ref 26.0–34.0)
MCHC: 32.6 g/dL (ref 30.0–36.0)
MCV: 90.6 fL (ref 80.0–100.0)
Platelets: 81 10*3/uL — ABNORMAL LOW (ref 150–400)
RBC: 3.42 MIL/uL — ABNORMAL LOW (ref 3.87–5.11)
RDW: 14.5 % (ref 11.5–15.5)
WBC: 3.2 10*3/uL — ABNORMAL LOW (ref 4.0–10.5)
nRBC: 0.9 % — ABNORMAL HIGH (ref 0.0–0.2)

## 2021-06-14 LAB — PROCALCITONIN: Procalcitonin: 32.4 ng/mL

## 2021-06-14 LAB — LACTIC ACID, PLASMA
Lactic Acid, Venous: >9 mmol/L (ref 0.5–1.9)
Lactic Acid, Venous: >9 mmol/L (ref 0.5–1.9)

## 2021-06-14 LAB — TROPONIN I (HIGH SENSITIVITY)
Troponin I (High Sensitivity): 42 ng/L — ABNORMAL HIGH (ref <18)
Troponin I (High Sensitivity): 121 ng/L (ref <18)

## 2021-06-14 LAB — BRAIN NATRIURETIC PEPTIDE: B Natriuretic Peptide: 589.5 pg/mL — ABNORMAL HIGH (ref 0.0–100.0)

## 2021-06-14 LAB — HIV ANTIBODY (ROUTINE TESTING W REFLEX): HIV Screen 4th Generation wRfx: NONREACTIVE

## 2021-06-14 LAB — SAMPLE TO BLOOD BANK

## 2021-06-14 LAB — PROTIME-INR
INR: 1.4 — ABNORMAL HIGH (ref 0.8–1.2)
INR: 1.8 — ABNORMAL HIGH (ref 0.8–1.2)
Prothrombin Time: 17.1 seconds — ABNORMAL HIGH (ref 11.4–15.2)
Prothrombin Time: 20.9 seconds — ABNORMAL HIGH (ref 11.4–15.2)

## 2021-06-14 LAB — PHOSPHORUS: Phosphorus: 8.1 mg/dL — ABNORMAL HIGH (ref 2.5–4.6)

## 2021-06-14 LAB — MAGNESIUM: Magnesium: 1.9 mg/dL (ref 1.7–2.4)

## 2021-06-14 LAB — APTT
aPTT: 39 seconds — ABNORMAL HIGH (ref 24–36)
aPTT: >200 seconds (ref 24–36)

## 2021-06-14 LAB — LIPASE, BLOOD: Lipase: 29 U/L (ref 11–51)

## 2021-06-14 SURGERY — CORONARY/GRAFT ACUTE MI REVASCULARIZATION

## 2021-06-14 SURGERY — LAPAROSCOPY, DIAGNOSTIC
Anesthesia: General

## 2021-06-14 MED ORDER — SODIUM BICARBONATE 8.4 % IV SOLN
INTRAVENOUS | Status: DC | PRN
  Administered 2021-06-14 (×4): 50 meq via INTRAVENOUS

## 2021-06-14 MED ORDER — EPINEPHRINE HCL 5 MG/250ML IV SOLN IN NS
0.5000 ug/min | INTRAVENOUS | Status: DC
  Administered 2021-06-14: 8 ug/min via INTRAVENOUS
  Filled 2021-06-14: qty 250

## 2021-06-14 MED ORDER — METOPROLOL TARTRATE 5 MG/5ML IV SOLN
INTRAVENOUS | Status: DC | PRN
  Administered 2021-06-14 (×2): 1 mg via INTRAVENOUS

## 2021-06-14 MED ORDER — FENTANYL BOLUS VIA INFUSION
25.0000 ug | INTRAVENOUS | Status: DC | PRN
  Filled 2021-06-14: qty 100

## 2021-06-14 MED ORDER — ROCURONIUM BROMIDE 10 MG/ML (PF) SYRINGE
PREFILLED_SYRINGE | INTRAVENOUS | Status: AC
Start: 2021-06-14 — End: 2021-06-14
  Administered 2021-06-14: 80 mg via INTRAVENOUS
  Filled 2021-06-14: qty 10

## 2021-06-14 MED ORDER — LACTATED RINGERS IV SOLN: INTRAVENOUS | Status: DC

## 2021-06-14 MED ORDER — ACETAMINOPHEN 10 MG/ML IV SOLN: 1000.0000 mg | Freq: Once | INTRAVENOUS | Status: DC | PRN

## 2021-06-14 MED ORDER — MIDAZOLAM HCL 2 MG/2ML IJ SOLN
INTRAMUSCULAR | Status: AC
  Filled 2021-06-14: qty 2

## 2021-06-14 MED ORDER — DOCUSATE SODIUM 50 MG/5ML PO LIQD: 100.0000 mg | Freq: Two times a day (BID) | ORAL | Status: DC

## 2021-06-14 MED ORDER — VASOPRESSIN 20 UNITS/100 ML INFUSION FOR SHOCK
0.0000 [IU]/min | INTRAVENOUS | Status: DC
  Administered 2021-06-14: 0.03 [IU]/min via INTRAVENOUS
  Filled 2021-06-14 (×2): qty 100

## 2021-06-14 MED ORDER — HEPARIN SODIUM (PORCINE) 5000 UNIT/ML IJ SOLN: 60.0000 [IU]/kg | Freq: Once | INTRAMUSCULAR | Status: AC

## 2021-06-14 MED ORDER — SODIUM BICARBONATE 8.4 % IV SOLN
100.0000 meq | Freq: Once | INTRAVENOUS | Status: AC
  Administered 2021-06-14: 100 meq via INTRAVENOUS
  Filled 2021-06-14: qty 50

## 2021-06-14 MED ORDER — NOREPINEPHRINE 4 MG/250ML-% IV SOLN
2.0000 ug/min | INTRAVENOUS | Status: DC
  Administered 2021-06-14: 28 ug/min via INTRAVENOUS
  Administered 2021-06-14: 35 ug/min via INTRAVENOUS
  Filled 2021-06-14 (×2): qty 250

## 2021-06-14 MED ORDER — SODIUM CHLORIDE 0.9 % IV SOLN: 250.0000 mL | INTRAVENOUS | Status: DC

## 2021-06-14 MED ORDER — FENTANYL CITRATE PF 50 MCG/ML IJ SOSY
50.0000 ug | PREFILLED_SYRINGE | Freq: Once | INTRAMUSCULAR | Status: AC
  Administered 2021-06-14: 50 ug via INTRAVENOUS
  Filled 2021-06-14: qty 1

## 2021-06-14 MED ORDER — FENTANYL CITRATE (PF) 100 MCG/2ML IJ SOLN
INTRAMUSCULAR | Status: AC
  Filled 2021-06-14: qty 2

## 2021-06-14 MED ORDER — FENTANYL 2500MCG IN NS 250ML (10MCG/ML) PREMIX INFUSION
25.0000 ug/h | INTRAVENOUS | Status: DC
  Filled 2021-06-14: qty 250

## 2021-06-14 MED ORDER — HEPARIN SODIUM (PORCINE) 5000 UNIT/ML IJ SOLN
INTRAMUSCULAR | Status: AC
  Administered 2021-06-14: 4000 [IU] via INTRAVENOUS
  Filled 2021-06-14: qty 1

## 2021-06-14 MED ORDER — MIDAZOLAM HCL 2 MG/2ML IJ SOLN
INTRAMUSCULAR | Status: AC
  Administered 2021-06-14: 2 mg via INTRAVENOUS
  Filled 2021-06-14: qty 2

## 2021-06-14 MED ORDER — ASPIRIN 81 MG PO CHEW: 324.0000 mg | CHEWABLE_TABLET | Freq: Once | ORAL | Status: AC

## 2021-06-14 MED ORDER — SODIUM CHLORIDE 0.9 % IV SOLN: INTRAVENOUS | Status: DC

## 2021-06-14 MED ORDER — HYDROCORTISONE SOD SUC (PF) 100 MG IJ SOLR
50.0000 mg | Freq: Three times a day (TID) | INTRAMUSCULAR | Status: DC
  Administered 2021-06-14: 50 mg via INTRAVENOUS
  Filled 2021-06-14: qty 2

## 2021-06-14 MED ORDER — VANCOMYCIN HCL 1500 MG/300ML IV SOLN
1500.0000 mg | Freq: Once | INTRAVENOUS | Status: AC
  Administered 2021-06-14: 1500 mg via INTRAVENOUS
  Filled 2021-06-14: qty 300

## 2021-06-14 MED ORDER — ROCURONIUM BROMIDE 100 MG/10ML IV SOLN
INTRAVENOUS | Status: DC | PRN
  Administered 2021-06-14: 100 mg via INTRAVENOUS

## 2021-06-14 MED ORDER — OXYCODONE HCL 5 MG PO TABS: 5.0000 mg | ORAL_TABLET | Freq: Once | ORAL | Status: DC | PRN

## 2021-06-14 MED ORDER — LACTATED RINGERS IV BOLUS (SEPSIS)
1000.0000 mL | Freq: Once | INTRAVENOUS | Status: AC
  Administered 2021-06-14: 1000 mL via INTRAVENOUS

## 2021-06-14 MED ORDER — METRONIDAZOLE 500 MG/100ML IV SOLN
500.0000 mg | Freq: Once | INTRAVENOUS | Status: AC
  Administered 2021-06-14: 500 mg via INTRAVENOUS
  Filled 2021-06-14: qty 100

## 2021-06-14 MED ORDER — IOHEXOL 350 MG/ML SOLN
100.0000 mL | Freq: Once | INTRAVENOUS | Status: AC | PRN
  Administered 2021-06-14: 100 mL via INTRAVENOUS

## 2021-06-14 MED ORDER — MIDAZOLAM HCL 2 MG/2ML IJ SOLN: 1.0000 mg | INTRAMUSCULAR | Status: DC | PRN

## 2021-06-14 MED ORDER — ALBUMIN HUMAN 5 % IV SOLN
INTRAVENOUS | Status: AC
  Filled 2021-06-14: qty 500

## 2021-06-14 MED ORDER — FENTANYL CITRATE (PF) 100 MCG/2ML IJ SOLN: 25.0000 ug | INTRAMUSCULAR | Status: DC | PRN

## 2021-06-14 MED ORDER — FENTANYL CITRATE PF 50 MCG/ML IJ SOSY
50.0000 ug | PREFILLED_SYRINGE | Freq: Once | INTRAMUSCULAR | Status: AC
  Administered 2021-06-14: 50 ug via INTRAVENOUS

## 2021-06-14 MED ORDER — LACTATED RINGERS IV BOLUS
1000.0000 mL | Freq: Once | INTRAVENOUS | Status: AC
  Administered 2021-06-14: 1000 mL via INTRAVENOUS

## 2021-06-14 MED ORDER — POLYETHYLENE GLYCOL 3350 17 G PO PACK: 17.0000 g | PACK | Freq: Every day | ORAL | Status: DC | PRN

## 2021-06-14 MED ORDER — CEFAZOLIN SODIUM 1 G IJ SOLR
INTRAMUSCULAR | Status: AC
  Filled 2021-06-14: qty 20

## 2021-06-14 MED ORDER — ETOMIDATE 2 MG/ML IV SOLN
20.0000 mg | Freq: Once | INTRAVENOUS | Status: AC
Start: 2021-06-14 — End: 2021-06-14

## 2021-06-14 MED ORDER — SODIUM CHLORIDE 0.9 % IV SOLN: 2.0000 g | INTRAVENOUS | Status: DC

## 2021-06-14 MED ORDER — DOCUSATE SODIUM 100 MG PO CAPS: 100.0000 mg | ORAL_CAPSULE | Freq: Two times a day (BID) | ORAL | Status: DC | PRN

## 2021-06-14 MED ORDER — POLYETHYLENE GLYCOL 3350 17 G PO PACK: 17.0000 g | PACK | Freq: Every day | ORAL | Status: DC

## 2021-06-14 MED ORDER — ROCURONIUM BROMIDE 50 MG/5ML IV SOLN
80.0000 mg | Freq: Once | INTRAVENOUS | Status: AC
  Filled 2021-06-14: qty 8

## 2021-06-14 MED ORDER — NOREPINEPHRINE 4 MG/250ML-% IV SOLN
0.0000 ug/min | INTRAVENOUS | Status: DC
  Administered 2021-06-14: 10 ug/min via INTRAVENOUS
  Filled 2021-06-14: qty 250

## 2021-06-14 MED ORDER — CALCIUM CHLORIDE 10 % IV SOLN
INTRAVENOUS | Status: DC | PRN
  Administered 2021-06-14 (×2): .5 g via INTRAVENOUS
  Administered 2021-06-14 (×2): 1 g via INTRAVENOUS

## 2021-06-14 MED ORDER — PHENYLEPHRINE HCL (PRESSORS) 10 MG/ML IV SOLN
INTRAVENOUS | Status: DC | PRN
  Administered 2021-06-14 (×2): 160 ug via INTRAVENOUS
  Administered 2021-06-14 (×2): 40 ug via INTRAVENOUS
  Administered 2021-06-14 (×2): 160 ug via INTRAVENOUS
  Administered 2021-06-14: 40 ug via INTRAVENOUS
  Administered 2021-06-14 (×3): 160 ug via INTRAVENOUS

## 2021-06-14 MED ORDER — PANTOPRAZOLE SODIUM 40 MG IV SOLR
40.0000 mg | Freq: Every day | INTRAVENOUS | Status: DC
  Administered 2021-06-14: 40 mg via INTRAVENOUS
  Filled 2021-06-14: qty 10

## 2021-06-14 MED ORDER — SODIUM BICARBONATE 8.4 % IV SOLN
INTRAVENOUS | Status: AC
  Filled 2021-06-14: qty 50

## 2021-06-14 MED ORDER — ETOMIDATE 2 MG/ML IV SOLN
INTRAVENOUS | Status: AC
  Administered 2021-06-14: 20 mg via INTRAVENOUS
  Filled 2021-06-14: qty 20

## 2021-06-14 MED ORDER — IPRATROPIUM-ALBUTEROL 0.5-2.5 (3) MG/3ML IN SOLN: 3.0000 mL | RESPIRATORY_TRACT | Status: DC | PRN

## 2021-06-14 MED ORDER — CEFAZOLIN SODIUM-DEXTROSE 2-3 GM-%(50ML) IV SOLR
INTRAVENOUS | Status: DC | PRN
  Administered 2021-06-14: 2 g via INTRAVENOUS

## 2021-06-14 MED ORDER — LACTATED RINGERS IV BOLUS
1000.0000 mL | Freq: Once | INTRAVENOUS | Status: AC
Start: 2021-06-14 — End: 2021-06-14
  Administered 2021-06-14: 1000 mL via INTRAVENOUS

## 2021-06-14 MED ORDER — VANCOMYCIN HCL IN DEXTROSE 1-5 GM/200ML-% IV SOLN
1000.0000 mg | Freq: Once | INTRAVENOUS | Status: DC
  Filled 2021-06-14: qty 200

## 2021-06-14 MED ORDER — EPINEPHRINE PF 1 MG/ML IJ SOLN
INTRAMUSCULAR | Status: DC | PRN
  Administered 2021-06-14 (×15): 1 mg via INTRAVENOUS

## 2021-06-14 MED ORDER — ASPIRIN 81 MG PO CHEW
CHEWABLE_TABLET | ORAL | Status: AC
Start: 2021-06-14 — End: 2021-06-14
  Administered 2021-06-14: 324 mg via ORAL
  Filled 2021-06-14: qty 4

## 2021-06-14 MED ORDER — SODIUM CHLORIDE 0.9 % IV SOLN
2.0000 g | Freq: Once | INTRAVENOUS | Status: AC
  Administered 2021-06-14: 2 g via INTRAVENOUS
  Filled 2021-06-14: qty 2

## 2021-06-14 MED ORDER — STERILE WATER FOR INJECTION IV SOLN
INTRAVENOUS | Status: DC
  Administered 2021-06-14: 100 mL via INTRAVENOUS
  Filled 2021-06-14: qty 1000
  Filled 2021-06-14: qty 150

## 2021-06-14 MED ORDER — ONDANSETRON HCL 4 MG/2ML IJ SOLN: 4.0000 mg | Freq: Once | INTRAMUSCULAR | Status: DC | PRN

## 2021-06-14 MED ORDER — METRONIDAZOLE 500 MG/100ML IV SOLN
500.0000 mg | Freq: Two times a day (BID) | INTRAVENOUS | Status: DC
  Filled 2021-06-14: qty 100

## 2021-06-14 MED ORDER — VASOPRESSIN 20 UNIT/ML IV SOLN
INTRAVENOUS | Status: DC | PRN
  Administered 2021-06-14 (×3): 5 [IU] via INTRAVENOUS

## 2021-06-14 MED ORDER — OXYCODONE HCL 5 MG/5ML PO SOLN: 5.0000 mg | Freq: Once | ORAL | Status: DC | PRN

## 2021-06-14 MED ORDER — MIDAZOLAM HCL 2 MG/2ML IJ SOLN
INTRAMUSCULAR | Status: DC | PRN
  Administered 2021-06-14: 1 mg via INTRAVENOUS

## 2021-06-14 MED ORDER — ONDANSETRON HCL 4 MG/2ML IJ SOLN
4.0000 mg | Freq: Once | INTRAMUSCULAR | Status: AC
  Administered 2021-06-14: 4 mg via INTRAVENOUS
  Filled 2021-06-14: qty 2

## 2021-06-14 MED ORDER — FENTANYL CITRATE PF 50 MCG/ML IJ SOSY
25.0000 ug | PREFILLED_SYRINGE | INTRAMUSCULAR | Status: DC | PRN
  Administered 2021-06-14: 50 ug via INTRAVENOUS
  Filled 2021-06-14 (×2): qty 1

## 2021-06-14 MED ORDER — MIDAZOLAM HCL 2 MG/2ML IJ SOLN: 2.0000 mg | Freq: Once | INTRAMUSCULAR | Status: AC

## 2021-06-14 MED ORDER — HEPARIN SODIUM (PORCINE) 5000 UNIT/ML IJ SOLN: 5000.0000 [IU] | Freq: Three times a day (TID) | INTRAMUSCULAR | Status: DC

## 2021-06-14 SURGICAL SUPPLY — 101 items
ADH SKN CLS APL DERMABOND .7 (GAUZE/BANDAGES/DRESSINGS)
APL PRP STRL LF DISP 70% ISPRP (MISCELLANEOUS)
BLADE CLIPPER SURG (BLADE) ×1 IMPLANT
BLADE SURG 15 STRL LF DISP TIS (BLADE) ×1 IMPLANT
BLADE SURG 15 STRL SS (BLADE)
BLADE SURG SZ10 CARB STEEL (BLADE) ×1 IMPLANT
BLADE SURG SZ11 CARB STEEL (BLADE) ×1 IMPLANT
CANNULA REDUC XI 12-8 STAPL (CANNULA)
CANNULA REDUCER 12-8 DVNC XI (CANNULA) ×1 IMPLANT
CHLORAPREP W/TINT 26 (MISCELLANEOUS) ×1 IMPLANT
COVER TIP SHEARS 8 DVNC (MISCELLANEOUS) ×1 IMPLANT
COVER TIP SHEARS 8MM DA VINCI (MISCELLANEOUS)
DERMABOND ADVANCED (GAUZE/BANDAGES/DRESSINGS)
DERMABOND ADVANCED .7 DNX12 (GAUZE/BANDAGES/DRESSINGS) ×1 IMPLANT
DRAPE ARM DVNC X/XI (DISPOSABLE) ×4 IMPLANT
DRAPE COLUMN DVNC XI (DISPOSABLE) ×1 IMPLANT
DRAPE DA VINCI XI ARM (DISPOSABLE)
DRAPE DA VINCI XI COLUMN (DISPOSABLE)
DRAPE LAPAROTOMY 100X77 ABD (DRAPES) ×1 IMPLANT
DRSG OPSITE POSTOP 4X10 (GAUZE/BANDAGES/DRESSINGS) ×1 IMPLANT
DRSG OPSITE POSTOP 4X8 (GAUZE/BANDAGES/DRESSINGS) ×1 IMPLANT
ELECT BLADE 6.5 EXT (BLADE) IMPLANT
ELECT CAUTERY BLADE 6.4 (BLADE) IMPLANT
ELECT REM PT RETURN 9FT ADLT (ELECTROSURGICAL)
ELECTRODE REM PT RTRN 9FT ADLT (ELECTROSURGICAL) ×1 IMPLANT
GAUZE 4X4 16PLY ~~LOC~~+RFID DBL (SPONGE) ×1 IMPLANT
GLOVE SURG ENC MOIS LTX SZ6.5 (GLOVE) ×3 IMPLANT
GLOVE SURG UNDER POLY LF SZ6.5 (GLOVE) ×3 IMPLANT
GOWN STRL REUS W/ TWL LRG LVL3 (GOWN DISPOSABLE) ×6 IMPLANT
GOWN STRL REUS W/TWL LRG LVL3 (GOWN DISPOSABLE)
HANDLE YANKAUER SUCT BULB TIP (MISCELLANEOUS) ×1 IMPLANT
IRRIGATION STRYKERFLOW (MISCELLANEOUS) ×1 IMPLANT
IRRIGATOR STRYKERFLOW (MISCELLANEOUS)
IRRIGATOR SUCT 8 DISP DVNC XI (IRRIGATION / IRRIGATOR) IMPLANT
IRRIGATOR SUCTION 8MM XI DISP (IRRIGATION / IRRIGATOR)
IV NS 1000ML (IV SOLUTION)
IV NS 1000ML BAXH (IV SOLUTION) IMPLANT
KIT IMAGING PINPOINTPAQ (MISCELLANEOUS) ×1 IMPLANT
KIT PINK PAD W/HEAD ARE REST (MISCELLANEOUS) IMPLANT
KIT PINK PAD W/HEAD ARM REST (MISCELLANEOUS) ×1 IMPLANT
KIT TURNOVER KIT A (KITS) ×1 IMPLANT
LABEL OR SOLS (LABEL) ×1 IMPLANT
MANIFOLD NEPTUNE II (INSTRUMENTS) ×1 IMPLANT
NDL INSUFF ACCESS 14 VERSASTEP (NEEDLE) ×1 IMPLANT
NDL INSUFFLATION 14GA 120MM (NEEDLE) ×1 IMPLANT
NEEDLE HYPO 22GX1.5 SAFETY (NEEDLE) ×1 IMPLANT
NEEDLE INSUFFLATION 14GA 120MM (NEEDLE) IMPLANT
NS IRRIG 1000ML POUR BTL (IV SOLUTION) ×1 IMPLANT
OBTURATOR OPTICAL STANDARD 8MM (TROCAR)
OBTURATOR OPTICAL STND 8 DVNC (TROCAR)
OBTURATOR OPTICALSTD 8 DVNC (TROCAR) ×1 IMPLANT
PACK BASIN MAJOR ARMC (MISCELLANEOUS) ×1 IMPLANT
PACK COLON CLEAN CLOSURE (MISCELLANEOUS) ×1 IMPLANT
PACK LAP CHOLECYSTECTOMY (MISCELLANEOUS) ×1 IMPLANT
PENCIL ELECTRO HAND CTR (MISCELLANEOUS) ×1 IMPLANT
PORT ACCESS TROCAR AIRSEAL 5 (TROCAR) ×1 IMPLANT
RELOAD STAPLE 45 3.5 BLU DVNC (STAPLE) IMPLANT
RELOAD STAPLE 60 3.5 BLU DVNC (STAPLE) IMPLANT
RELOAD STAPLER 3.5X45 BLU DVNC (STAPLE) IMPLANT
RELOAD STAPLER 3.5X60 BLU DVNC (STAPLE) IMPLANT
RETRACTOR WOUND ALXS 18CM SML (MISCELLANEOUS) IMPLANT
RTRCTR WOUND ALEXIS O 18CM SML (MISCELLANEOUS)
SCISSORS METZENBAUM CVD 33 (INSTRUMENTS) ×1 IMPLANT
SEAL CANN UNIV 5-8 DVNC XI (MISCELLANEOUS) ×3 IMPLANT
SEAL XI 5MM-8MM UNIVERSAL (MISCELLANEOUS)
SEALER VESSEL DA VINCI XI (MISCELLANEOUS)
SEALER VESSEL EXT DVNC XI (MISCELLANEOUS) IMPLANT
SET TRI-LUMEN FLTR TB AIRSEAL (TUBING) ×1 IMPLANT
SET TUBE SMOKE EVAC HIGH FLOW (TUBING) ×1 IMPLANT
SOLUTION ELECTROLUBE (MISCELLANEOUS) ×1 IMPLANT
SPONGE T-LAP 18X18 ~~LOC~~+RFID (SPONGE) ×4 IMPLANT
SPONGE T-LAP 4X18 ~~LOC~~+RFID (SPONGE) ×1 IMPLANT
STAPLER 45 DA VINCI SURE FORM (STAPLE)
STAPLER 45 SUREFORM DVNC (STAPLE) IMPLANT
STAPLER 60 DA VINCI SURE FORM (STAPLE)
STAPLER 60 SUREFORM DVNC (STAPLE) IMPLANT
STAPLER CANNULA SEAL DVNC XI (STAPLE) ×1 IMPLANT
STAPLER CANNULA SEAL XI (STAPLE)
STAPLER RELOAD 3.5X45 BLU DVNC (STAPLE)
STAPLER RELOAD 3.5X45 BLUE (STAPLE)
STAPLER RELOAD 3.5X60 BLU DVNC (STAPLE)
STAPLER RELOAD 3.5X60 BLUE (STAPLE)
SUT MNCRL 4-0 (SUTURE)
SUT MNCRL 4-0 27XMFL (SUTURE)
SUT MNCRL AB 4-0 PS2 18 (SUTURE) ×1 IMPLANT
SUT PDS AB 0 CT1 27 (SUTURE) ×2 IMPLANT
SUT SILK 2 0 (SUTURE)
SUT SILK 2-0 18XBRD TIE 12 (SUTURE) ×1 IMPLANT
SUT SILK 3 0 (SUTURE)
SUT SILK 3 0 SH 30 (SUTURE) IMPLANT
SUT SILK 3-0 18XBRD TIE 12 (SUTURE) ×1 IMPLANT
SUT VIC AB 3-0 SH 27 (SUTURE)
SUT VIC AB 3-0 SH 27X BRD (SUTURE) ×4 IMPLANT
SUT VICRYL 0 AB UR-6 (SUTURE) ×2 IMPLANT
SUT VLOC 90 6 CV-15 VIOLET (SUTURE) ×1 IMPLANT
SUTURE MNCRL 4-0 27XMF (SUTURE) ×2 IMPLANT
SYR 30ML LL (SYRINGE) ×2 IMPLANT
TRAY FOLEY MTR SLVR 16FR STAT (SET/KITS/TRAYS/PACK) ×1 IMPLANT
TROCAR XCEL BLUNT TIP 100MML (ENDOMECHANICALS) ×2 IMPLANT
TROCAR XCEL NON-BLD 5MMX100MML (ENDOMECHANICALS) ×1 IMPLANT
WATER STERILE IRR 500ML POUR (IV SOLUTION) ×1 IMPLANT

## 2021-06-15 LAB — BLOOD CULTURE ID PANEL (REFLEXED) - BCID2

## 2021-06-15 LAB — HEMOGLOBIN A1C
Hgb A1c MFr Bld: 5.8 % — ABNORMAL HIGH (ref 4.8–5.6)
Mean Plasma Glucose: 119.76 mg/dL

## 2021-06-15 NOTE — Progress Notes (Signed)
Contacted [name] ME. Explained patient history and situation. Declined as a ME case. ?

## 2021-06-15 NOTE — ED Notes (Signed)
Defib pads in place

## 2021-06-15 NOTE — ED Notes (Signed)
Decision by Cardiologist to cancel emergent cath at this time.  Dr. Leonides Schanz at bedside and aware. ?

## 2021-06-15 NOTE — ED Notes (Signed)
Cardiology to bedside. 

## 2021-06-15 NOTE — Progress Notes (Signed)
PHARMACY -  BRIEF ANTIBIOTIC NOTE  ? ?Pharmacy has received consult(s) for Vanc, Cefepime from an ED provider.  The patient's profile has been reviewed for ht/wt/allergies/indication/available labs.   ? ?One time order(s) placed for Vancomycin 1500 mg IV X 1 and Cefepime 2 gm IV X 1  ? ?Further antibiotics/pharmacy consults should be ordered by admitting physician if indicated.       ?                ?Thank you, ?Belky Mundo D ?July 03, 2021  5:42 AM ? ?

## 2021-06-15 NOTE — Anesthesia Preprocedure Evaluation (Addendum)
Anesthesia Evaluation  ?Patient identified by MRN, date of birth, ID band ?Patient unresponsive ? ? ? ?Reviewed: ?Allergy & Precautions, NPO status , Patient's Chart, lab work & pertinent test results ? ?History of Anesthesia Complications ?(+) PONV and history of anesthetic complications ? ?Airway ?Mallampati: Intubated ? ? ? ? ? ? Dental ?  ?Pulmonary ?neg pulmonary ROS,  ?  ? ? ? ?+ intubated ? ? ? Cardiovascular ?hypertension,  ?Rhythm:Regular Rate:Normal ? ?ECG 2021/06/16:  ?Sinus tachycardia (HR 109) ?Left bundle branch block ?  ?Neuro/Psych ?PSYCHIATRIC DISORDERS Anxiety negative neurological ROS ?   ? GI/Hepatic ?Cholecystitis s/p lap cholecystectomy 06/12/21 ?  ?Endo/Other  ?Hypothyroidism Prediabetes  ? Renal/GU ?negative Renal ROS  ? ?  ?Musculoskeletal ? ? Abdominal ?  ?Peds ? Hematology ?negative hematology ROS ?(+)   ?Anesthesia Other Findings ?Patient acutely septic, arterial and central lines placed 16-Jun-2021; patient became hypoxic with sedation for lines and intubated 2021-06-16; concern for aspiration due to bilious gastric contents in oropharynx with intubation attempt.   ? ?Cardiology note 2021/06/16:  ?Abnormal EKG ?Chest pain ?Canceled CODE STEMI ?Elevated troponin ?- presented with chest and abdominal pain. She is s/p 2 days cholecystectomy ?- EKG concerning for STEMI, MD consulted and this was canceled. LBBB looks like this is new.  ?- Bed side echo showed normal LV function ?- SXS improved with pain meds.  ?- IV heparin held for recent surgery, monitor Hgb ?- Found to be septic with elevated HR, LA ?- Hs troponin elevated to 42, suspect demand ischemia. Continue to trend ?- Echo ordered ? ?Septic shock ?Severe sepsis, suspect intra-abdominal source ?2 days post-op cholecystectomy ?- LA >9, RR 36 and tachycardic ?- IVF ?- BC pending ?- abx per CCM ?- on Vasopressors ?- General surgery consulted ?? ?AKI ?- Scr 4.13/BUN 35 ?- s/p IVF and pressors for low BP ?- nephrology  consulted ? Reproductive/Obstetrics ?Breast and cervical cancers, BRCA+ ? ?  ? ? ? ? ? ? ? ? ? ? ? ? ? ?  ?  ? ? ? ? ? ? ?Anesthesia Physical ?Anesthesia Plan ? ?ASA: 4 and emergent ? ?Anesthesia Plan: General  ? ?Post-op Pain Management:   ? ?Induction: Intravenous ? ?PONV Risk Score and Plan: 4 or greater and Ondansetron, Dexamethasone and Treatment may vary due to age or medical condition ? ?Airway Management Planned: Oral ETT ? ?Additional Equipment: Arterial line ? ?Intra-op Plan:  ? ?Post-operative Plan: Post-operative intubation/ventilation ? ?Informed Consent: I have reviewed the patients History and Physical, chart, labs and discussed the procedure including the risks, benefits and alternatives for the proposed anesthesia with the patient or authorized representative who has indicated his/her understanding and acceptance.  ? ? ? ?Dental advisory given ? ?Plan Discussed with: CRNA ? ?Anesthesia Plan Comments: (Patient's daughters consented for risks of anesthesia including but not limited to:  ?- adverse reactions to medications ?- damage to eyes, teeth, lips or other oral mucosa ?- nerve damage due to positioning  ?- sore throat or hoarseness ?- damage to heart, brain, nerves, lungs, other parts of body or loss of life ? ?Daughters voiced understanding.)  ? ? ? ? ? ?Anesthesia Quick Evaluation ? ?

## 2021-06-15 NOTE — Op Note (Signed)
Preoperative diagnosis: Septic shock, pneumoperitoneum ? ?Postoperative diagnosis: Same. ? ?Procedure: Attempted Diagnostic laparoscopy ? ?Anesthesia: GETA ?  ?Surgeon: Dr. Windell Moment ? ?Wound Classification: Clean Contaminated ? ?Indications: Patient is a 66 y.o. female developed who had robotic assisted laparoscopic cholecystectomy 2 days ago present with abdominal pain, septic shock and pneumoperitoneum. Diagnostic laparoscopy indicated to rule out intra peritoneal source due to complications of previous cholecystectomy ? ?Findings: ?Patient went to cardiac arrest upon incision of skin.  ?Aborted procedure after more than an hour of ACLS ? ?Description of procedure: The patient was placed on the operating table in the supine position. General anesthesia was induced. A time-out was completed verifying correct patient, procedure, site, positioning, and implant(s) and/or special equipment prior to beginning this procedure. An orogastric tube was placed. The abdomen was prepped and draped in the usual sterile fashion.  ?An incision was made in the previous right periumbilical incision. At that moment the patient loss her pulse and ACLS was started. Surgery aborted.  ? ? ?

## 2021-06-15 NOTE — Progress Notes (Signed)
? ?Progress Note ? ?Patient Name: Wanda Reed ?Date of Encounter: 2021-06-16 ? ?[facility] Cardiologist: New ? ?Subjective  ? ?Emergently Intubated this morning. Daughter is at bedside.  ? ?Inpatient Medications  ?  ?Scheduled Meds: ? heparin  5,000 Units Subcutaneous Q8H  ? hydrocortisone sod succinate (SOLU-CORTEF) inj  50 mg Intravenous Q8H  ? sodium bicarbonate      ? ?Continuous Infusions: ? sodium chloride    ? sodium chloride    ? [START ON 06/15/2021] ceFEPime (MAXIPIME) IV    ? lactated ringers 150 mL/hr at 06-16-21 3875  ? metronidazole    ? norepinephrine (LEVOPHED) Adult infusion    ?  sodium bicarbonate (isotonic) infusion in sterile water    ? vancomycin 1,500 mg (06-16-21 6433)  ? vasopressin    ? ?PRN Meds: ?docusate sodium, fentaNYL (SUBLIMAZE) injection, polyethylene glycol  ? ?Vital Signs  ?  ?Vitals:  ? 06-16-2021 0820 06/16/2021 0825 16-Jun-2021 0830 06-16-2021 0837  ?BP: (!) 84/60     ?Pulse: (!) 114 (!) 111 (!) 120   ?Resp:  (!) 30 (!) 28   ?Temp:      ?TempSrc:      ?SpO2: 91% 94% 94%   ?Weight:    68.4 kg  ?Height:    '5\' 1"'$  (1.549 m)  ? ? ?Intake/Output Summary (Last 24 hours) at 06/16/21 0840 ?Last data filed at 2021-06-16 2951 ?Gross per 24 hour  ?Intake 2091.58 ml  ?Output --  ?Net 2091.58 ml  ? ? ?  2021/06/16  ?  8:37 AM 06/16/21  ?  5:15 AM 06/12/2021  ?  8:10 AM  ?Last 3 Weights  ?Weight (lbs) 150 lb 12.7 oz 132 lb 4.4 oz 130 lb  ?Weight (kg) 68.4 kg 60 kg 58.968 kg  ?   ? ?Telemetry  ?  ?ST, HR 100-130s, LBBB - Personally Reviewed ? ?ECG  ?  ?ST 109 bpm LBBB, nonspecific ST/T wave changes - Personally Reviewed ? ?Physical Exam  ? ?GEN: critically ill intubated and sedated ?Neck: No JVD ?Cardiac: RR. tachycardic, no murmurs, rubs, or gallops.  ?Respiratory: Clear to auscultation bilaterally. ?GI: Soft, nontender, non-distended  ?MS: No edema; No deformity. ?Neuro:  Nonfocal  ?Psych: Normal affect  ? ?Labs  ?  ?High Sensitivity Troponin:   ?Recent Labs  ?Lab 16-Jun-2021 ?0518   ?TROPONINIHS 42*  ?   ?Chemistry ?Recent Labs  ?Lab 06/16/21 ?0518  ?NA 126*  ?K 3.6  ?CL 91*  ?CO2 10*  ?GLUCOSE 103*  ?BUN 36*  ?CREATININE 4.13*  ?CALCIUM 9.2  ?PROT 6.0*  ?ALBUMIN 2.9*  ?AST 76*  ?ALT 27  ?ALKPHOS 82  ?BILITOT 1.2  ?GFRNONAA 11*  ?ANIONGAP 25*  ?  ?Lipids  ?Recent Labs  ?Lab June 16, 2021 ?0518  ?CHOL 114  ?TRIG 123  ?HDL 63  ?Parkin 26  ?CHOLHDL 1.8  ?  ?Hematology ?Recent Labs  ?Lab 06/16/21 ?0518  ?WBC 6.9  ?RBC 5.21*  ?HGB 15.3*  ?HCT 48.7*  ?MCV 93.5  ?MCH 29.4  ?MCHC 31.4  ?RDW 14.6  ?PLT 221  ? ?Thyroid No results for input(s): TSH, FREET4 in the last 168 hours.  ?BNP ?Recent Labs  ?Lab 06/16/2021 ?0518  ?BNP 589.5*  ?  ?DDimer No results for input(s): DDIMER in the last 168 hours.  ? ?Radiology  ?  ?DG Cholangiogram Operative ? ?Result Date: 06/12/2021 ?CLINICAL DATA:  66 year old female with cholelithiasis EXAM: INTRAOPERATIVE CHOLANGIOGRAM TECHNIQUE: Cholangiographic images from the C-arm fluoroscopic device were submitted for interpretation  post-operatively. Please see the procedural report for the amount of contrast and the fluoroscopy time utilized. FLUOROSCOPY: Radiation Exposure Index (as provided by the fluoroscopic device): Op note COMPARISON:  None. FINDINGS: Surgical instruments project over the upper abdomen. There is cannulation of the cystic duct/gallbladder neck, with antegrade infusion of contrast. Caliber of the extrahepatic ductal system within normal limits. No definite filling defect within the extrahepatic ducts identified. Free flow of contrast across the ampulla. IMPRESSION: Intraoperative cholangiogram demonstrates extrahepatic biliary ducts of unremarkable caliber, with no definite filling defects identified. Free flow of contrast across the ampulla. Please refer to the dictated operative report for full details of intraoperative findings and procedure Electronically Signed   By: Corrie Mckusick D.O.   On: 06/12/2021 10:29  ? ?CT Angio Chest PE W and/or Wo  Contrast ? ?Result Date: June 17, 2021 ?CLINICAL DATA:  Postop abdominal pain and hypotension. Patient is postoperative day 2 from cholecystectomy EXAM: CT ANGIOGRAPHY CHEST CT ABDOMEN AND PELVIS WITH CONTRAST TECHNIQUE: Multidetector CT imaging of the chest was performed using the standard protocol during bolus administration of intravenous contrast. Multiplanar CT image reconstructions and MIPs were obtained to evaluate the vascular anatomy. Multidetector CT imaging of the abdomen and pelvis was performed using the standard protocol during bolus administration of intravenous contrast. RADIATION DOSE REDUCTION: This exam was performed according to the departmental dose-optimization program which includes automated exposure control, adjustment of the mA and/or kV according to patient size and/or use of iterative reconstruction technique. CONTRAST:  175m OMNIPAQUE IOHEXOL 350 MG/ML SOLN COMPARISON:  Abdomen/pelvis CT 06/02/2019 FINDINGS: CTA CHEST FINDINGS Cardiovascular: Heart is enlarged. No substantial pericardial effusion. No thoracic aortic aneurysm. No dissection of the thoracic aorta. There is no filling defect within the opacified pulmonary arteries to suggest the presence of an acute pulmonary embolus. Mediastinum/Nodes: No mediastinal lymphadenopathy. There is no hilar lymphadenopathy. Fluid in the esophagus likely related to reflux. There is no axillary lymphadenopathy. Lungs/Pleura: Atelectasis noted in the dependent lung bases. No pneumothorax or substantial pleural effusion. Musculoskeletal: No worrisome lytic or sclerotic osseous abnormality. Review of the MIP images confirms the above findings. CT ABDOMEN and PELVIS FINDINGS Hepatobiliary: Nodular liver contour is compatible with cirrhosis. Stable left hepatic cyst. No follow-up recommended. Gallbladder surgically absent. Trace fluid noted in the gallbladder fossa. Common bile duct diameter upper normal at 6 mm, similar to prior study. Pancreas: No focal  mass lesion. No dilatation of the main duct. No intraparenchymal cyst. No peripancreatic edema. Spleen: No splenomegaly. No focal mass lesion. Adrenals/Urinary Tract: No adrenal nodule or mass. Punctate stone noted lower pole right kidney without hydronephrosis. Left kidney unremarkable. No evidence for hydroureter. Bladder is decompressed with high attenuation material in the lumen likely excreted contrast. Gas in the bladder lumen is presumably secondary to recent instrumentation. Edema/fluid is identified in the extraperitoneal tissues of the pelvic floor and in the perivesical space. Stomach/Bowel: Stomach is markedly distended with fluid. Duodenum is fluid-filled and distended. Small bowel loops are mildly distended throughout with congestion/edema noted diffusely in the small bowel mesentery. The terminal ileum is normal. The appendix is not well visualized, but there is no edema or inflammation in the region of the cecum. Diverticuli are seen scattered along the entire length of the colon without CT findings of diverticulitis. Vascular/Lymphatic: There is mild atherosclerotic calcification of the abdominal aorta without aneurysm. There is no gastrohepatic or hepatoduodenal ligament lymphadenopathy. No retroperitoneal or mesenteric lymphadenopathy. No pelvic sidewall lymphadenopathy. Reproductive: Uterus surgically absent.  There is no adnexal mass.  Other: Small volume free fluid seen around the liver and spleen as well as in the cul-de-sac. Intraperitoneal free air is identified in the abdomen and pelvis, not entirely unexpected on postoperative day 2. Musculoskeletal: No worrisome lytic or sclerotic osseous abnormality. Review of the MIP images confirms the above findings. IMPRESSION: 1. No CT evidence for acute pulmonary embolus. 2. Atelectasis in the dependent lung bases. 3. Intraperitoneal free air in the abdomen and pelvis, not unexpected on postoperative day 2. 4. Edema/fluid in the extraperitoneal  tissues of the pelvic floor and in the perivesical space. Finding is indeterminate. Bladder is decompressed and there is gas in the bladder lumen presumably secondary to recent instrumentation. In the absence of recent instr

## 2021-06-15 NOTE — Significant Event (Addendum)
Contacted by Wanda Reed to talk with family about process for private informational autopsy. Had been authorized to make arrangements. Explained process to family, they agreed they would like to have it done. Spoke with patient's daughter, Wanda Reed.([phone]) other family present. Initially unsure, but then stated they would like to have an autopsy done. Contacted [facility] '@[phone]'$ . Spoke with Wanda Reed from the answering service. Stated she would have someone call me back. Notified Dr. Windell Moment about pending autopsy over the telephone. Gave family contact information for the administrative coordinator, and information about where the autopsy was to be done. Explained timeframe would probably be Sunday or Monday. Encouraged them to call if they had questions.  Reassured the autopsy facility was not affiliated with Wanda Reed, and they said that was what they were interested in being assured of.  ?Called daughter, Wanda Reed'@[phone]'$  to update autopsy would be done Sunday or Monday and patient would return on same day.  ?

## 2021-06-15 NOTE — Progress Notes (Signed)
Pt intubated with 7.5 ETT, 25@ lip. Tube pulled back to 23 @ lip per NP request based on CXR results ?

## 2021-06-15 NOTE — Procedures (Signed)
Intubation Procedure Note ? ?[name]  ?[id number]  ?[birth year] ? ?Date:2021-06-18  ?Time:9:08 AM  ? ?Provider Performing:Corinthian Mizrahi  ? ? ?Procedure: Intubation (65035) ? ?Indication(s) ?Respiratory Failure ? ?Consent ?Unable to obtain consent due to emergent nature of procedure. ? ? ?Anesthesia ?Etomidate and Rocuronium ? ? ?Time Out ?Verified patient identification, verified procedure, site/side was marked, verified correct patient position, special equipment/implants available, medications/allergies/relevant history reviewed, required imaging and test results available. ? ? ?Sterile Technique ?Usual hand hygeine, masks, and gloves were used ? ? ?Procedure Description ?Patient positioned in bed supine.  Sedation given as noted above.  Patient was intubated with endotracheal tube using Glidescope.  View was Grade 1 full glottis .  Number of attempts was 1.  Colorimetric CO2 detector was consistent with tracheal placement. ? ? ?Complications/Tolerance ?None; patient tolerated the procedure well. ?Chest X-ray is ordered to verify placement. ? ? ?EBL ?Minimal ? ? ?Specimen(s) ?None ? ? ?Marshell Garfinkel MD ?[facility] ?See Amion for pager ? ?If no response to pager , please call [phone] until 7pm ?After 7:00 pm call Elink  [phone] ?2021-06-18, 9:08 AM  ?

## 2021-06-15 NOTE — Progress Notes (Signed)
?   Jul 14, 2021 0610  ?Clinical Encounter Type  ?Visited With Patient and family together  ?Visit Type Initial;Code  ?Spiritual Encounters  ?Spiritual Needs Prayer  ? ?Chaplain responded to Code STEMI and provided compassionate presence and prayer. ?

## 2021-06-15 NOTE — Consult Note (Signed)
Pharmacy Antibiotic Note ? ?Wanda Reed is a 66 y.o. female admitted on 2021-06-24 with sepsis with no clear source.  Pharmacy has been consulted for Cefepime dosing. Patient is also on metronidazole and received 1 dose of Vancomycin in ED. ? ?Patient in severe AKI(SCR 4.13, b/l<0.7). Has received multiple boluses of lactated ringers as well as levophed and vasopressin d/t hypotensive state. Will dose conservatively until renal function improves. ? ?Plan: ?Cefepime 2g IV Q 24 hours ? ?Height: '5\' 1"'$  (154.9 cm) ?Weight: 60 kg (132 lb 4.4 oz) ?IBW/kg (Calculated) : 47.8 ? ?Temp (24hrs), Avg:97.6 ?F (36.4 ?C), Min:97.6 ?F (36.4 ?C), Max:97.6 ?F (36.4 ?C) ? ?Recent Labs  ?Lab Jun 24, 2021 ?0518  ?WBC 6.9  ?CREATININE 4.13*  ?LATICACIDVEN >9.0*  ?  ?Estimated Creatinine Clearance: 11.3 mL/min (A) (by C-G formula based on SCr of 4.13 mg/dL (H)).   ? ?No Known Allergies ? ?Antimicrobials this admission: ?Cefepime/Flagyl 3/31 >>  ?Vancomycin x1 ? ?Dose adjustments this admission: ?Renally adjusted dose currently ? ?Microbiology results: ?3/31 BCx: pending ?3/31 UCx: pending  ? ? ?Thank you for allowing pharmacy to be a part of this patient?s care. ? ?[name] ?2021/06/24 8:02 AM ? ?

## 2021-06-15 NOTE — Consult Note (Signed)
[facility] ?Consult Note: June 21, 2021 ? ? ? ?Date of Admission:  2021-06-21    ? ?      ?Reason for Consult: AKI    ?Referring Provider: Marshell Garfinkel, MD ?Primary Care Provider: [facility] ? ? ?History of Presenting Illness:  ?Wanda Reed is a 66 y.o. female  ?Patient was intubated when seen.  All information is obtained from primary team as well as the chart review.  Patient underwent robotic assisted laparoscopic cholecystectomy with cholangiogram on June 12, 2021.  As per available records, surgery was done under general anesthesia and was uneventful.  Patient was found to have cirrhotic liver. ? ?On March 31, patient presented to the emergency room with severe abdominal pain, nausea, vomiting since discharge.  Per notes she has not had a BM in 3 days.  She was taking tramadol and ibuprofen. ? ?Other relevant past medical history includes C-section, bilateral oophorectomy, hysterectomy, history of breast cancer treated with chemoradiation in 2018, right lumpectomy April 2021. ? ?Review of outpatient meds also indicate patient was on amlodipine, acyclovir, lisinopril, metformin. ?Upon initial arrival in the ER, there were concerns of STEMI.  She was evaluated by cardiology team.  Because of clinical picture, recent surgery, cardiac catheterization was deferred..  Later on patient became hypoxic and therefore had to be emergently intubated. ? ?This morning, patient's lactic acid is found to be greater than 9, pH of less than 6.95 creatinine of 4.1 compared to baseline of 0.51 from October 2022. ? ? ?Review of Systems: ?ROS-not available due to patient's critical illness ? ?Past Medical History:  ?Diagnosis Date  ? Anxiety   ? BRCA2 positive   ? pt and 2 daughters are positive  ? Breast cancer ([place]) 07/2016  ? right breast lumpectomy with chemo and mammosite  ? Breast cancer of upper-outer quadrant of right female breast ([place]) 08/2016  ? 1.0 cm ER+, PR-, Her 2 neu not  overexpressed. Node negative. T1b, N0.  ? Cervical cancer ([place]) 1990  ? Hypertension   ? Hypothyroidism   ? Personal history of chemotherapy 11/2016-12/2016  ? right breast ca  ? Personal history of radiation therapy 09/15/2016  ? mammosite  ? PONV (postoperative nausea and vomiting)   ? Pre-diabetes   ? Thyroid disease   ? Urinary tract infection   ? ? ?Social History  ? ?Tobacco Use  ? Smoking status: Never  ? Smokeless tobacco: Never  ?Vaping Use  ? Vaping Use: Never used  ?Substance Use Topics  ? Alcohol use: No  ? Drug use: No  ? ? ?Family History  ?Problem Relation Age of Onset  ? Breast cancer Neg Hx   ? ? ? ?OBJECTIVE: ?Blood pressure (!) 122/59, pulse (!) 130, temperature 97.6 ?F (36.4 ?C), temperature source Oral, resp. rate (!) 24, height '5\' 1"'  (1.549 m), weight 68.4 kg, SpO2 92 %. ? ?Physical Exam ?General appearance: Critically ill-appearing ?HEENT: ET tube, OG tube in place with dark aspirate ?Pulmonary: Ventilator assisted ?Cardiovascular: Irregular, tachycardic ?Abdomen: Wounds appear clean and dry ?Extremities: Trace edema ?Neuro: Sedated ?Skin: No acute rashes ?Musculoskeletal: No acute joint swellings or effusions ? ?Lab Results ?Lab Results  ?Component Value Date  ? WBC 6.9 June 21, 2021  ? HGB 15.3 (H) 2021/06/21  ? HCT 48.7 (H) 2021/06/21  ? MCV 93.5 2021-06-21  ? PLT 221 Jun 21, 2021  ?  ?Lab Results  ?Component Value Date  ? CREATININE 4.13 (H) 06-21-2021  ? BUN 36 (H) 21-Jun-2021  ? NA  126 (L) 07/04/21  ? K 3.6 07-04-2021  ? CL 91 (L) 07/04/2021  ? CO2 10 (L) 2021/07/04  ?  ?Lab Results  ?Component Value Date  ? ALT 27 Jul 04, 2021  ? AST 76 (H) 07-04-2021  ? ALKPHOS 82 07/04/2021  ? BILITOT 1.2 2021/07/04  ?  ? ?Microbiology: ?Recent Results (from the past 240 hour(s))  ?Resp Panel by RT-PCR (Flu A&B, Covid) Nasopharyngeal Swab     Status: None  ? Collection Time: Jul 04, 2021  5:18 AM  ? Specimen: Nasopharyngeal Swab; Nasopharyngeal(NP) swabs in vial transport medium  ?Result Value Ref Range Status   ? SARS Coronavirus 2 by RT PCR NEGATIVE NEGATIVE Final  ?  Comment: (NOTE) ?SARS-CoV-2 target nucleic acids are NOT DETECTED. ? ?The SARS-CoV-2 RNA is generally detectable in upper respiratory ?specimens during the acute phase of infection. The lowest ?concentration of SARS-CoV-2 viral copies this assay can detect is ?138 copies/mL. A negative result does not preclude SARS-Cov-2 ?infection and should not be used as the sole basis for treatment or ?other patient management decisions. A negative result may occur with  ?improper specimen collection/handling, submission of specimen other ?than nasopharyngeal swab, presence of viral mutation(s) within the ?areas targeted by this assay, and inadequate number of viral ?copies(<138 copies/mL). A negative result must be combined with ?clinical observations, patient history, and epidemiological ?information. The expected result is Negative. ? ?Fact Sheet for Patients:  ?EntrepreneurPulse.com.au ? ?Fact Sheet for Healthcare Providers:  ?IncredibleEmployment.be ? ?This test is no t yet approved or cleared by the [place] FDA and  ?has been authorized for detection and/or diagnosis of SARS-CoV-2 by ?FDA under an Emergency Use Authorization (EUA). This EUA will remain  ?in effect (meaning this test can be used) for the duration of the ?COVID-19 declaration under Section 564(b)(1) of the Act, 21 ?U.S.C.section 360bbb-3(b)(1), unless the authorization is terminated  ?or revoked sooner.  ? ? ?  ? Influenza A by PCR NEGATIVE NEGATIVE Final  ? Influenza B by PCR NEGATIVE NEGATIVE Final  ?  Comment: (NOTE) ?The Xpert Xpress SARS-CoV-2/FLU/RSV plus assay is intended as an aid ?in the diagnosis of influenza from Nasopharyngeal swab specimens and ?should not be used as a sole basis for treatment. Nasal washings and ?aspirates are unacceptable for Xpert Xpress SARS-CoV-2/FLU/RSV ?testing. ? ?Fact Sheet for  Patients: ?EntrepreneurPulse.com.au ? ?Fact Sheet for Healthcare Providers: ?IncredibleEmployment.be ? ?This test is not yet approved or cleared by the [place] FDA and ?has been authorized for detection and/or diagnosis of SARS-CoV-2 by ?FDA under an Emergency Use Authorization (EUA). This EUA will remain ?in effect (meaning this test can be used) for the duration of the ?COVID-19 declaration under Section 564(b)(1) of the Act, 21 U.S.C. ?section 360bbb-3(b)(1), unless the authorization is terminated or ?revoked. ? ?Performed at [facility], [place], ?[place] [address] ?  ? ? ?Medications: ?Scheduled Meds: ? [MAR Hold] docusate  100 mg Per Tube BID  ? [MAR Hold] heparin  5,000 Units Subcutaneous Q8H  ? [MAR Hold] hydrocortisone sod succinate (SOLU-CORTEF) inj  50 mg Intravenous Q8H  ? [MAR Hold] pantoprazole (PROTONIX) IV  40 mg Intravenous Daily  ? [MAR Hold] polyethylene glycol  17 g Per Tube Daily  ? ?Continuous Infusions: ? sodium chloride    ? sodium chloride    ? [MAR Hold] ceFEPime (MAXIPIME) IV    ? fentaNYL infusion INTRAVENOUS    ? lactated ringers 150 mL/hr at 07/04/21 1130  ? [MAR Hold] metronidazole    ? [  MAR Hold] norepinephrine (LEVOPHED) Adult infusion 32 mcg/min (06-19-21 1152)  ?  sodium bicarbonate (isotonic) infusion in sterile water 100 mL/hr at 19-Jun-2021 0940  ? vasopressin 0.03 Units/min (2021/06/19 1152)  ? ?PRN Meds:.[MAR Hold] docusate sodium, [MAR Hold] fentaNYL, [MAR Hold] fentaNYL (SUBLIMAZE) injection, [MAR Hold] ipratropium-albuterol, [MAR Hold] midazolam, [MAR Hold] midazolam, [MAR Hold] polyethylene glycol ? ?No Known Allergies ? ?Urinalysis: ?No results for input(s): COLORURINE, LABSPEC, [place], GLUCOSEU, HGBUR, BILIRUBINUR, KETONESUR, PROTEINUR, UROBILINOGEN, NITRITE, LEUKOCYTESUR in the last 72 hours. ? ?Invalid input(s): APPERANCEUR  ? ? ?Imaging: ?DG Chest 1 View ? ?Result Date: 06/19/21 ?CLINICAL DATA:   Encounter for central line placement EXAM: CHEST  1 VIEW COMPARISON:  Earlier today at 9:15 a.m. FINDINGS: 11 a.m.  Left internal jugular line tip at low SVC. Endotracheal tube terminates 2.7 cm above carina. Nasogastric tube extends beyond the inferio

## 2021-06-15 NOTE — Procedures (Signed)
Arterial Catheter Insertion Procedure Note ?Wanda Reed ?[id number] ?May 04, 1955 ? ?Procedure: Insertion of Arterial Catheter  ?Indications: Blood pressure monitoring and Frequent blood sampling ? ?Procedure Details ?Consent: Risks of procedure as well as the alternatives and risks of each were explained to the (patient/caregiver).  Consent for procedure obtained. ?Time Out: Verified patient identification, verified procedure, site/side was marked, verified correct patient position, special equipment/implants available, medications/allergies/relevent history reviewed, required imaging and test results available.  Performed ? ?Maximum sterile technique was used including antiseptics, cap, gloves, gown, hand hygiene, mask, and sheet. ?Skin prep: Chlorhexidine; local anesthetic administered ?20 gauge catheter was inserted into left femoral artery using the Seldinger technique. ? ?Evaluation ?Blood flow good; BP tracing good. ?Complications: No apparent complications. ? ? ?BIOPATCH applied to the insertion site. ? ? ?Wanda Reed, AGACNP-BC ?[facility] ?Prefer epic messenger for cross cover needs ?If after hours, please call E-link ? ?Wanda Reed ?06-26-21 ? ?

## 2021-06-15 NOTE — ED Notes (Signed)
Return from CT. Provider remains at pt side.  ?

## 2021-06-15 NOTE — ED Notes (Signed)
Code STEMI activated

## 2021-06-15 NOTE — ED Triage Notes (Signed)
Pt presents via POV with complaints of severe abdominal pain - pt had gallbladder sx two days ago. Pt has had N/V over the last several days. Pt pale in appearance - she notes not having a BM in 3 days. Denies CP or SOB.  ?

## 2021-06-15 NOTE — Progress Notes (Signed)
Apneic, Wanda Roux NP at [facility], notified RT stat, opened airway and started bag-mask ventilation. Dr Vaughan Browner arriving, prepare to intubate. Daughter notified and arriving to bedside. ?

## 2021-06-15 NOTE — Consult Note (Signed)
?Cardiology Consultation:  ? ?Patient ID: Wanda Reed ?MRN: [id number]; DOB: 05-29-1955 ? ?Admit date: 06/22/21 ?Date of Consult: June 22, 2021 ? ?PCP:  [place] ?  ?[facility] Providers ?Cardiologist:  None      ? ? ?Patient Profile:  ? ?Wanda Reed is a 66 y.o. female with a hx of hypertension and recent laparoscopic cholecystectomy who is being seen 06/22/21 for the evaluation of abnormal ECG at the request of Dr. Leonides Schanz. ? ?History of Present Illness:  ? ?Wanda Reed speaks only [place].  Her daughter serves as the interpreter.  She had abdominal pain after her surgery which persisted.  At about 3 AM, she developed chest discomfort.  Her daughter brought her to the emergency room and an ECG showed ST elevation in V1 and V2.  Due to the recent surgery, she had CT of abdomen and chest.  There is no PE.  There were no acute, unexpected abdominal findings.  There was some air in the abdomen but this was expected.  There may have been an ileus.  No obvious bowel obstruction.  There was some fluid around the bladder and potentially air in the bladder. ? ?Code STEMI was activated.  I came to see the patient.  Her chest discomfort was significantly improved.  Her pain is now all in her abdomen.  She feels like she is developing a UTI.  Her lactate came back at 9.  Bedside echo showed normal LV function.  Aortic valve appears to open normally. ? ?After discussion with the ER, the patient appears much more comfortable.  She is requiring Levophed 30 mics per minute.  At the end of the visit, again she confirmed that her chest pain has resolved.  ECG changed into left bundle branch block. ? ? ?Past Medical History:  ?Diagnosis Date  ? Anxiety   ? BRCA2 positive   ? pt and 2 daughters are positive  ? Breast cancer ([place]) 07/2016  ? right breast lumpectomy with chemo and mammosite  ? Breast cancer of upper-outer quadrant of right female breast ([place]) 08/2016  ? 1.0 cm ER+, PR-, Her  2 neu not overexpressed. Node negative. T1b, N0.  ? Cervical cancer ([place]) 1990  ? Hypertension   ? Hypothyroidism   ? Personal history of chemotherapy 11/2016-12/2016  ? right breast ca  ? Personal history of radiation therapy 09/15/2016  ? mammosite  ? PONV (postoperative nausea and vomiting)   ? Pre-diabetes   ? Thyroid disease   ? Urinary tract infection   ? ? ?Past Surgical History:  ?Procedure Laterality Date  ? ABDOMINAL HYSTERECTOMY    ? BREAST BIOPSY Right 08/08/2016  ? invasive mammary carcinoma  ? BREAST BIOPSY Right 06/01/2019  ? bx neg and abbcess drainage  ? BREAST LUMPECTOMY Right 08/28/2016  ? invasive mammary carcinoma, clear margins, negative lymph nodes  ? BREAST LUMPECTOMY WITH SENTINEL LYMPH NODE BIOPSY Right 08/28/2016  ? Procedure: BREAST LUMPECTOMY WITH SENTINEL LYMPH NODE BX;  Surgeon: [name], MD;  Location: [facility] ORS;  Service: General;  Laterality: Right;  ? BREAST MAMMOSITE Right 09/15/2016  ? Procedure: MAMMOSITE BREAST;  Surgeon: [name], MD;  Location: [facility] ORS;  Service: General;  Laterality: Right;  ? BREAST SURGERY Right   ? Breast Biopsy  ? CERVIX SURGERY    ? COLONOSCOPY  2008  ? COLONOSCOPY WITH PROPOFOL N/A 04/26/2020  ? Procedure: COLONOSCOPY WITH PROPOFOL;  Surgeon: Jonathon Bellows, MD;  Location: [facility] ENDOSCOPY;  Service: Gastroenterology;  Laterality: N/A;  ? ESOPHAGOGASTRODUODENOSCOPY (EGD) WITH PROPOFOL N/A 04/26/2020  ? Procedure: ESOPHAGOGASTRODUODENOSCOPY (EGD) WITH PROPOFOL;  Surgeon: Jonathon Bellows, MD;  Location: [facility] ENDOSCOPY;  Service: Gastroenterology;  Laterality: N/A;  ? EYE SURGERY Bilateral   ? Cataract Extraction with IOL  ? INTRAOPERATIVE CHOLANGIOGRAM N/A 06/12/2021  ? Procedure: INTRAOPERATIVE CHOLANGIOGRAM;  Surgeon: Herbert Pun, MD;  Location: [facility] ORS;  Service: General;  Laterality: N/A;  ? PORTA CATH INSERTION Right 11/20/2016  ? Procedure: PORTA CATH INSERTION;  Surgeon: [name], MD;  Location: [facility] ORS;  Service: General;   Laterality: Right;  ? PORTA CATH REMOVAL  2018  ? RE-EXCISION OF BREAST LUMPECTOMY Right 07/14/2019  ? Procedure: RE-EXCISION OF Right BREAST LUMPECTOMY;  Surgeon: Jules Husbands, MD;  Location: [facility] ORS;  Service: General;  Laterality: Right;  ? SHOULDER ARTHROSCOPY Right 2012  ? torn ligaments  ?  ? ? ? ?Inpatient Medications: ?Scheduled Meds: ? heparin  5,000 Units Subcutaneous Q8H  ? hydrocortisone sod succinate (SOLU-CORTEF) inj  50 mg Intravenous Q8H  ? ?Continuous Infusions: ? sodium chloride    ? lactated ringers 150 mL/hr at 07/12/2021 7322  ? metronidazole 500 mg (2021-07-12 0254)  ? norepinephrine (LEVOPHED) Adult infusion 35 mcg/min (07/12/21 0641)  ? vancomycin 1,500 mg (2021/07/12 2706)  ? vasopressin    ? ?PRN Meds: ? ? ?Allergies:   No Known Allergies ? ?Social History:   ?Social History  ? ?Socioeconomic History  ? Marital status: Married  ?  Spouse name: Not on file  ? Number of children: Not on file  ? Years of education: Not on file  ? Highest education level: Not on file  ?Occupational History  ? Not on file  ?Tobacco Use  ? Smoking status: Never  ? Smokeless tobacco: Never  ?Vaping Use  ? Vaping Use: Never used  ?Substance and Sexual Activity  ? Alcohol use: No  ? Drug use: No  ? Sexual activity: Yes  ?  Birth control/protection: Post-menopausal  ?Other Topics Concern  ? Not on file  ?Social History Narrative  ? Not on file  ? ?Social Determinants of Health  ? ?Financial Resource Strain: Not on file  ?Food Insecurity: Not on file  ?Transportation Needs: Not on file  ?Physical Activity: Not on file  ?Stress: Not on file  ?Social Connections: Not on file  ?Intimate Partner Violence: Not on file  ?  ?Family History:   ? ?Family History  ?Problem Relation Age of Onset  ? Breast cancer Neg Hx   ?  ? ?ROS:  ?Please see the history of present illness.  ?Abdominal pain ?All other ROS reviewed and negative.    ? ?Physical Exam/Data:  ? ?Vitals:  ? 12-Jul-2021 0630 Jul 12, 2021 2376 07/12/2021 0645 Jul 12, 2021 0650  ?BP:  (!) 96/25 (!) 94/51 (!) 110/29 (!) 115/55  ?Pulse: (!) 108 (!) 108 (!) 116 (!) 107  ?Resp: (!) 33 (!) 33 (!) 30 (!) 25  ?Temp:      ?TempSrc:      ?SpO2: 99% 98% 98% 97%  ?Weight:      ?Height:      ? ? ?Intake/Output Summary (Last 24 hours) at 07-12-21 0656 ?Last data filed at 2021-07-12 2831 ?Gross per 24 hour  ?Intake 2000 ml  ?Output --  ?Net 2000 ml  ? ? ?  Jul 12, 2021  ?  5:15 AM 06/12/2021  ?  8:10 AM 06/06/2021  ?  9:31 AM  ?Last 3 Weights  ?Weight (lbs) 132  lb 4.4 oz 130 lb 131 lb  ?Weight (kg) 60 kg 58.968 kg 59.421 kg  ?   ?Body mass index is 24.99 kg/m?.  ?General:  Well nourished, well developed, in no acute distress ?HEENT: normal ?Neck: no JVD ?Vascular: No carotid bruits; Distal pulses 2+ bilaterally ?Cardiac:  normal S1, S2; tachycardic; no murmur  ?Lungs:  clear to auscultation bilaterally, no wheezing, rhonchi or rales  ?Abd: soft, diffusely tender, no hepatomegaly  ?Ext: no edema ?Musculoskeletal:  No deformities, BUE and BLE strength normal and equal ?Skin: warm and dry  ?Neuro:  CNs 2-12 intact, no focal abnormalities noted ?Psych:  Normal affect  ? ?EKG:  The EKG was personally reviewed and demonstrates: Initial showed sinus tachycardia with ST elevation in V1 and V2, T wave inversions noted inferior and laterally, ECG changes in a similar pattern to her prior ECG but clearly more accentuated; repeat ECG shows left bundle branch block ?Telemetry:  Telemetry was personally reviewed and demonstrates: Sinus tachycardia ? ?Relevant CV Studies: ?Prior ECGs reviewed ? ?Laboratory Data: ? ?High Sensitivity Troponin:   ?Recent Labs  ?Lab Jun 29, 2021 ?0518  ?TROPONINIHS 42*  ?   ?ChemistryNo results for input(s): NA, K, CL, CO2, GLUCOSE, BUN, CREATININE, CALCIUM, MG, GFRNONAA, GFRAA, ANIONGAP in the last 168 hours.  ?No results for input(s): PROT, ALBUMIN, AST, ALT, ALKPHOS, BILITOT in the last 168 hours. ?Lipids No results for input(s): CHOL, TRIG, HDL, LABVLDL, LDLCALC, CHOLHDL in the last 168 hours.   ?Hematology ?Recent Labs  ?Lab Jun 29, 2021 ?0518  ?WBC 6.9  ?RBC 5.21*  ?HGB 15.3*  ?HCT 48.7*  ?MCV 93.5  ?MCH 29.4  ?MCHC 31.4  ?RDW 14.6  ?PLT 221  ? ?Thyroid No results for input(s): TSH, FREET4 in the l

## 2021-06-15 NOTE — Anesthesia Postprocedure Evaluation (Addendum)
Anesthesia Post Note ? ?Patient: Jesyca Weisenburger ? ?Procedure(s) Performed: LAPAROSCOPY DIAGNOSTIC ? ?Patient location during evaluation: ICU ?Anesthesia Type: General ?Level of consciousness: patient remains intubated per anesthesia plan (deceased) ?Respiratory status: patient remains intubated per anesthesia plan and patient on ventilator - see flowsheet for VS ?Cardiovascular status: PEA. ?Anesthetic complications: yes ?Comments: Discussed transfer to ICU with [name].  Patient was still registering blood pressure and electrical activity in OR.  Decided to transport on monitors to ICU.  Upon arrival to ICU, patient no longer had electrical activity on ECG.  Family called to room. ? ? ?No notable events documented. ? ? ?Last Vitals:  ?Vitals:  ? July 14, 2021 1145 2021/07/14 1405  ?BP:    ?Pulse:    ?Resp: 14 (!) 0  ?Temp:    ?SpO2:    ?  ?Last Pain:  ?Vitals:  ? 2021/07/14 0830  ?TempSrc:   ?PainSc: 7   ? ? ?  ?  ?  ?  ?  ?  ? ?Darrin Nipper ? ? ? ? ?

## 2021-06-15 NOTE — Progress Notes (Signed)
Pt to have central line and arterial line placed due to high vasopressor requirements, multiple IV infusions, and need for continuous BP monitoring and frequent lab draws.  Pt was extremely anxious and with severe abdominal pain.  Was given 2 mg Versed and 50 mcg Fentanyl to help her tolerate the procedure.   ? ?While setting up for line placement, pt became severely hypoxic with O2 sats dropping as low at the 30's briefly (continued to have respiratory drive,never became apneic or agonal respirations).  She was emergently given rescue breaths via bag valve mask, and O2 sats quickly responded back to mid to upper 90's.  Given rapid decline, decision was made to emergently intubate. ? ? ?Intubation performed successfully by Dr. Vaughan Browner.  During intubation, pt having copious amounts of bile/gastric contents refluxing into oropharynx requiring suctioning.  Concern for aspiration.  Pt already being covered with Cefepime and Flagyl.  Once OG tube placed and put to suction, about 400-500 cc of bile/gastric secretions removed. ? ?Pt's daughter updated at bedside of situation and need for intubation with concern for aspiration. ? ? ? ? ?Darel Hong, AGACNP-BC ?[facility] ?Prefer epic messenger for cross cover needs ?If after hours, please call E-link ? ?

## 2021-06-15 NOTE — Consult Note (Signed)
SURGICAL CONSULTATION NOTE  ? ?HISTORY OF PRESENT ILLNESS (HPI):  ?66 y.o. female presented to [facility] ED for evaluation of abdominal pain, chest pain and shortness of breath.  Patient intubated in the ICU at the moment of the evaluation. ? ?History taken from daughter and ICU provider.  Patient had robotic assisted upper scopic cholecystectomy with intraoperative cholangiogram 2 days ago.  No complication during the surgery were identified.  I personally performed the cholecystectomy.  Patient will discharge home same day.  She started complaining of abdominal pain and chest pain that was progressively getting worse.  She decided to come to the ED last night.  At the ED she was found with hypotension, tachycardia, elevated lactic acid with metabolic acidosis.  She was started with sepsis protocol and started on Levophed.  This morning the patient deteriorated and she was intubated.  She is currently on Levophed and slowly getting stabilized. ? ?CT scan of the abdomen pelvis shows abundant amount of very hepatic fluid and pneumoperitoneum.  Even though this can be expected after surgery I think that is more than what I would expect after an "uneventful cholecystectomy". ? ?Surgery is consulted by [facility], MD in this context for evaluation and management of septic shock with #21 after robotic assisted laparoscopic cholecystectomy. ? ?PAST MEDICAL HISTORY (PMH):  ?Past Medical History:  ?Diagnosis Date  ? Anxiety   ? BRCA2 positive   ? pt and 2 daughters are positive  ? Breast cancer ([place]) 07/2016  ? right breast lumpectomy with chemo and mammosite  ? Breast cancer of upper-outer quadrant of right female breast ([place]) 08/2016  ? 1.0 cm ER+, PR-, Her 2 neu not overexpressed. Node negative. T1b, N0.  ? Cervical cancer ([place]) 1990  ? Hypertension   ? Hypothyroidism   ? Personal history of chemotherapy 11/2016-12/2016  ? right breast ca  ? Personal history of radiation therapy 09/15/2016  ? mammosite  ? PONV (postoperative  nausea and vomiting)   ? Pre-diabetes   ? Thyroid disease   ? Urinary tract infection   ?  ? ?PAST SURGICAL HISTORY ([place]):  ?Past Surgical History:  ?Procedure Laterality Date  ? ABDOMINAL HYSTERECTOMY    ? BREAST BIOPSY Right 08/08/2016  ? invasive mammary carcinoma  ? BREAST BIOPSY Right 06/01/2019  ? bx neg and abbcess drainage  ? BREAST LUMPECTOMY Right 08/28/2016  ? invasive mammary carcinoma, clear margins, negative lymph nodes  ? BREAST LUMPECTOMY WITH SENTINEL LYMPH NODE BIOPSY Right 08/28/2016  ? Procedure: BREAST LUMPECTOMY WITH SENTINEL LYMPH NODE BX;  Surgeon: [name], MD;  Location: [facility] ORS;  Service: General;  Laterality: Right;  ? BREAST MAMMOSITE Right 09/15/2016  ? Procedure: MAMMOSITE BREAST;  Surgeon: [name], MD;  Location: [facility] ORS;  Service: General;  Laterality: Right;  ? BREAST SURGERY Right   ? Breast Biopsy  ? CERVIX SURGERY    ? COLONOSCOPY  2008  ? COLONOSCOPY WITH PROPOFOL N/A 04/26/2020  ? Procedure: COLONOSCOPY WITH PROPOFOL;  Surgeon: Jonathon Bellows, MD;  Location: [facility] ENDOSCOPY;  Service: Gastroenterology;  Laterality: N/A;  ? ESOPHAGOGASTRODUODENOSCOPY (EGD) WITH PROPOFOL N/A 04/26/2020  ? Procedure: ESOPHAGOGASTRODUODENOSCOPY (EGD) WITH PROPOFOL;  Surgeon: Jonathon Bellows, MD;  Location[facility] ENDOSCOPY;  Service: Gastroenterology;  Laterality: N/A;  ? EYE SURGERY Bilateral   ? Cataract Extraction with IOL  ? INTRAOPERATIVE CHOLANGIOGRAM N/A 06/12/2021  ? Procedure: INTRAOPERATIVE CHOLANGIOGRAM;  Surgeon: Herbert Pun, MD;  Location: [facility] ORS;  Service: General;  Laterality: N/A;  ? PORTA CATH INSERTION  Right 11/20/2016  ? Procedure: PORTA CATH INSERTION;  Surgeon: [name], MD;  Location: [facility] ORS;  Service: General;  Laterality: Right;  ? PORTA CATH REMOVAL  2018  ? RE-EXCISION OF BREAST LUMPECTOMY Right 07/14/2019  ? Procedure: RE-EXCISION OF Right BREAST LUMPECTOMY;  Surgeon: Jules Husbands, MD;  Location: [facility] ORS;  Service: General;  Laterality:  Right;  ? SHOULDER ARTHROSCOPY Right 2012  ? torn ligaments  ?  ? ?MEDICATIONS:  ?Prior to Admission medications   ?Medication Sig Start Date End Date Taking? Authorizing Provider  ?ACCU-CHEK GUIDE test strip 1 each daily. 12/27/20   [provider]  ?acyclovir (ZOVIRAX) 400 MG tablet Take 400 mg by mouth 3 (three) times daily. 05/13/21   [provider]  ?amLODipine (NORVASC) 5 MG tablet Take 5 mg by mouth daily.    [provider]  ?AZO-CRANBERRY PO Take by mouth.    [provider]  ?Blood Glucose Monitoring Suppl (ACCU-CHEK GUIDE) w/Device KIT use as directed for diabetes, dx E11.9 12/27/20   [provider]  ?Blood Pressure Monitoring (BLOOD PRESSURE KIT) KIT Apply topically as directed. 12/20/20   [provider]  ?CALCIUM PO Take 1 tablet by mouth daily.    [provider]  ?gabapentin (NEURONTIN) 100 MG capsule Take 200 mg by mouth 2 (two) times daily. Takes 2 capsules in the morning and two capsules HS    [provider]  ?[employer] Lancets UltraThin 30G MISC Use   as directed   to check blood sugar dx E11.9 12/27/20   [provider]  ?letrozole (FEMARA) 2.5 MG tablet Take 1 tablet (2.5 mg total) by mouth daily. 05/27/21   Lloyd Huger, MD  ?levothyroxine (SYNTHROID, LEVOTHROID) 100 MCG tablet Take 100 mcg by mouth daily before breakfast.    [provider]  ?lisinopril (PRINIVIL,ZESTRIL) 10 MG tablet Take 10 mg by mouth daily.    [provider]  ?Melatonin 12 MG TABS Take by mouth.    [provider]  ?metFORMIN (GLUCOPHAGE) 500 MG tablet Take 500 mg by mouth 2 (two) times daily. 06/05/20   [provider]  ?senna-docusate (SENOKOT-S) 8.6-50 MG tablet Take 1 tablet by mouth daily. ?Patient not taking: Reported on 06/03/2021 06/13/20   Billey Co, MD  ?traMADol (ULTRAM) 50 MG tablet Take 1 tablet (50 mg total) by mouth every 6 (six) hours as needed. 06/12/21 06/12/22  Herbert Pun, MD  ?mirabegron ER (MYRBETRIQ) 25 MG TB24 tablet Take 1 tablet (25 mg total) by mouth daily. ?Patient not taking: No sig reported 09/01/19 05/04/20  Zara Council A, PA-C  ?  ? ?ALLERGIES:  ?No Known Allergies  ? ?SOCIAL HISTORY:  ?Social History  ? ?Socioeconomic History  ? Marital status: Married  ?  Spouse name: Not on file  ? Number of children: Not on file  ? Years of education: Not on file  ? Highest education level: Not on file  ?Occupational History  ? Not on file  ?Tobacco Use  ? Smoking status: Never  ? Smokeless tobacco: Never  ?Vaping Use  ? Vaping Use: Never used  ?Substance and Sexual Activity  ? Alcohol use: No  ? Drug use: No  ? Sexual activity: Yes  ?  Birth control/protection: Post-menopausal  ?Other Topics Concern  ? Not on file  ?Social History Narrative  ? Not on file  ? ?Social Determinants of Health  ? ?Financial Resource Strain: Not on file  ?Food Insecurity: Not on file  ?  Transportation Needs: Not on file  ?Physical Activity: Not on file  ?Stress: Not on file  ?Social Connections: Not on file  ?Intimate Partner Violence: Not on file  ?  ? ? ?FAMILY HISTORY:  ?Family History  ?Problem Relation Age of Onset  ? Breast cancer Neg Hx   ?  ? ?REVIEW OF SYSTEMS:  ?Constitutional: denies weight loss, fever, chills, or sweats  ?Eyes: denies any other vision changes, history of eye injury  ?ENT: denies sore throat, hearing problems  ?Respiratory: denies shortness of breath, wheezing  ?Cardiovascular: Positive chest pain, palpitations  ?Gastrointestinal: Positive abdominal pain, nausea and vomiting ?Genitourinary: denies burning with urination or urinary frequency ?Musculoskeletal: denies any other joint pains or cramps  ?Skin: denies any other rashes or skin discolorations  ?Neurological: denies any other headache, dizziness, weakness  ?Psychiatric: denies any other depression, anxiety  ? ?All other review of systems were negative  ? ?VITAL SIGNS:  ?Temp:  [97.6 ?F (36.4 ?C)] 97.6 ?F (36.4  ?C) (03/31 0516) ?Pulse Rate:  [107-138] 130 (03/31 0935) ?Resp:  [9-44] 24 (03/31 0940) ?BP: (76-139)/(25-91) 122/59 (03/31 0940) ?SpO2:  [86 %-100 %] 92 % (03/31 0935) ?FiO2 (%):  [60 %] 60 % (03/31 0910) ?Weight

## 2021-06-15 NOTE — Progress Notes (Signed)
Initial Nutrition Assessment ? ?DOCUMENTATION CODES:  ? ?Not applicable ? ?INTERVENTION:  ? ?-If unable to extubate within 48 hours, consider: ? ?Initiate Vital High Protein @ 20 ml/hr and increase by 10 ml every 4 hours to goal rate of 50 ml/hr.  ? ?30 ml free water flush every 4 hours to maintain tube patency ? ?Tube feeding regimen provides 1200 kcal (95% of needs), 105 grams of protein, and 1003 ml of H2O.  Total free water: 1183 ml daily ? ?NUTRITION DIAGNOSIS:  ? ?Inadequate oral intake related to inability to eat as evidenced by NPO status. ? ?GOAL:  ? ?Patient will meet greater than or equal to 90% of their needs ? ?MONITOR:  ? ?Vent status, Labs, Weight trends, Skin, I & O's ? ?REASON FOR ASSESSMENT:  ? ?Ventilator ?  ? ?ASSESSMENT:  ? ?66 y.o. female with a hx of hypertension and recent laparoscopic cholecystectomy who is being seen 06-24-2021 for the evaluation of abnormal ECG ? ?3/31- intubated ? ?Patient is currently intubated on ventilator support. OGT placement confirmed by x-ray.  ?MV: 6.9 L/min ?Temp (24hrs), Avg:97.6 ?F (36.4 ?C), Min:97.6 ?F (36.4 ?C), Max:97.6 ?F (36.4 ?C) ? ?Reviewed I/O's: +2 L x 24 hours ? ?UOP: 65 ml x 24 hours  ? ?MAP: 93 ? ?Case discussed with MD, RN, and during ICU rounds.  ? ?Code STEMI called. Per MD, pt with severe shock and intra-abdominal infection. Pressors to be started. Cariology is following and plan to consult nephrology secondary to severe kidney failure.  ? ?Attempted to examine pt x 2, however, receiving nursing care at times of visits.  ? ?Per MD, OGT will be connected to LIWS to bowel rest. No plans to initiate TF today.  ? ?Medications reviewed and include colace, miralax, and colace.  ? ?Lab Results  ?Component Value Date  ? HGBA1C 6.1 (H) 12/03/2016  ? PTA DM medications are 500 mg metformin BID.  ? ?Labs reviewed: Na: 126, CBGS: 84 (inpatient orders for glycemic control are none).   ? ?Diet Order:   ?Diet Order   ? ?       ?  Diet NPO time specified  Diet  effective now       ?  ? ?  ?  ? ?  ? ? ?EDUCATION NEEDS:  ? ?Not appropriate for education at this time ? ?Skin:  Skin Assessment: Reviewed RN Assessment ? ?Last BM:  Unknown ? ?Height:  ? ?Ht Readings from Last 1 Encounters:  ?06/24/21 '5\' 1"'$  (1.549 m)  ? ? ?Weight:  ? ?Wt Readings from Last 1 Encounters:  ?2021-06-24 68.4 kg  ? ? ?Ideal Body Weight:  47.7 kg ? ?BMI:  Body mass index is 28.49 kg/m?. ? ?Estimated Nutritional Needs:  ? ?Kcal:  1204 ? ?Protein:  90-105 grams ? ?Fluid:  > 1.2 L ? ? ? ?Loistine Chance, RD, LDN, CDCES ?Registered Dietitian II ?Certified Diabetes Care and Education Specialist ?Please refer to [facility] for RD and/or RD on-call/weekend/after hours pager  ?

## 2021-06-15 NOTE — Transfer of Care (Signed)
Immediate Anesthesia Transfer of Care Note ? ?Patient: Wanda Reed ? ?Procedure(s) Performed: LAPAROSCOPY DIAGNOSTIC ?EXPLORATORY LAPAROTOMY ?XI ROBOT ASSISTED DIAGNOSTIC LAPAROSCOPY ? ?Patient Location: ICU ? ?Anesthesia Type:General ? ?Level of Consciousness: obtunded and Patient remains intubated per anesthesia plan ? ?Airway & Oxygen Therapy: Patient remains intubated per anesthesia plan and Patient placed on Ventilator (see vital sign flow sheet for setting) bp 98/84  ? ?Post-op Assessment: Pt in ICU with ICU team and family at bedside.  ? ?Post vital signs: Reviewed ? ?Last Vitals:  ?Vitals Value Taken Time  ?BP 98/84 07/03/21 1405  ?Temp    ?Pulse    ?Resp 0 07/03/2021 1405  ?SpO2    ?Vitals shown include unvalidated device data. ? ?Last Pain:  ?Vitals:  ? 07/03/21 0830  ?TempSrc:   ?PainSc: 7   ?   ? ?  ? ?Complications: Pt on ICU, comfort care discussed w/family TFH ?

## 2021-06-15 NOTE — Progress Notes (Addendum)
Lab unable to get 2nd lactate due to vasopressors. CVC being inserted. Difficult lab draw per RN. ?

## 2021-06-15 NOTE — ED Provider Notes (Signed)
[place]  ?Department of Emergency Medicine ? ? ?Code Blue CONSULT NOTE ? ?Chief Complaint: Cardiac arrest/unresponsive  ? ?Level V Caveat: Unresponsive ? ?History of present illness: I was contacted by the hospital for a CODE BLUE cardiac arrest upstairs and presented to the patient's bedside.  ? ?Patient was in OR 6.  Dr. Erenest Rasher and Dr. Wynetta Emery (both anesthesia MDs) attending to the patient. ? ?I did verify with them that the patient had a patent airway, and they have end-tidal CO2 readings with Square waveform measuring in the mid 30s with anesthesiologist confirming patent airway with bloody secretions. ? ?Patient was evidently undergoing an emergent procedure when noted to have cardiac arrest ? ?Anesthesiologist advised the patient has had 3 rounds of epinephrine and ongoing for several minutes now (I did did respond right away to the [place] request) ? ?Clarified with anesthesiologist, they have called a CODE BLUE for additional assistance or potential advice.  ? ?ROS: Unable to obtain, Level V caveat ? ?Scheduled Meds: ? [MAR Hold] docusate  100 mg Per Tube BID  ? [MAR Hold] heparin  5,000 Units Subcutaneous Q8H  ? [MAR Hold] hydrocortisone sod succinate (SOLU-CORTEF) inj  50 mg Intravenous Q8H  ? [MAR Hold] pantoprazole (PROTONIX) IV  40 mg Intravenous Daily  ? [MAR Hold] polyethylene glycol  17 g Per Tube Daily  ? ?Continuous Infusions: ? sodium chloride    ? sodium chloride    ? [MAR Hold] ceFEPime (MAXIPIME) IV    ? fentaNYL infusion INTRAVENOUS    ? lactated ringers 150 mL/hr at 06-19-21 1130  ? [MAR Hold] metronidazole    ? [MAR Hold] norepinephrine (LEVOPHED) Adult infusion 32 mcg/min (2021/06/19 1152)  ?  sodium bicarbonate (isotonic) infusion in sterile water 100 mL/hr at June 19, 2021 0940  ? vasopressin 0.03 Units/min (2021-06-19 1152)  ? ?PRN Meds:.[MAR Hold] docusate sodium, [MAR Hold] fentaNYL, [MAR Hold] fentaNYL (SUBLIMAZE) injection, [MAR Hold] ipratropium-albuterol, [MAR Hold]  midazolam, [MAR Hold] midazolam, [MAR Hold] polyethylene glycol ?Past Medical History:  ?Diagnosis Date  ? Anxiety   ? BRCA2 positive   ? pt and 2 daughters are positive  ? Breast cancer ([place]) 07/2016  ? right breast lumpectomy with chemo and mammosite  ? Breast cancer of upper-outer quadrant of right female breast ([place]) 08/2016  ? 1.0 cm ER+, PR-, Her 2 neu not overexpressed. Node negative. T1b, N0.  ? Cervical cancer ([place]) 1990  ? Hypertension   ? Hypothyroidism   ? Personal history of chemotherapy 11/2016-12/2016  ? right breast ca  ? Personal history of radiation therapy 09/15/2016  ? mammosite  ? PONV (postoperative nausea and vomiting)   ? Pre-diabetes   ? Thyroid disease   ? Urinary tract infection   ? ?Past Surgical History:  ?Procedure Laterality Date  ? ABDOMINAL HYSTERECTOMY    ? BREAST BIOPSY Right 08/08/2016  ? invasive mammary carcinoma  ? BREAST BIOPSY Right 06/01/2019  ? bx neg and abbcess drainage  ? BREAST LUMPECTOMY Right 08/28/2016  ? invasive mammary carcinoma, clear margins, negative lymph nodes  ? BREAST LUMPECTOMY WITH SENTINEL LYMPH NODE BIOPSY Right 08/28/2016  ? Procedure: BREAST LUMPECTOMY WITH SENTINEL LYMPH NODE BX;  Surgeon: [name], MD;  Location: [facility] ORS;  Service: General;  Laterality: Right;  ? BREAST MAMMOSITE Right 09/15/2016  ? Procedure: MAMMOSITE BREAST;  Surgeon: [name], MD;  Location: [facility] ORS;  Service: General;  Laterality: Right;  ? BREAST SURGERY Right   ? Breast Biopsy  ?  CERVIX SURGERY    ? COLONOSCOPY  2008  ? COLONOSCOPY WITH PROPOFOL N/A 04/26/2020  ? Procedure: COLONOSCOPY WITH PROPOFOL;  Surgeon: Jonathon Bellows, MD;  Location: [facility] ENDOSCOPY;  Service: Gastroenterology;  Laterality: N/A;  ? ESOPHAGOGASTRODUODENOSCOPY (EGD) WITH PROPOFOL N/A 04/26/2020  ? Procedure: ESOPHAGOGASTRODUODENOSCOPY (EGD) WITH PROPOFOL;  Surgeon: Jonathon Bellows, MD;  Location: [facility] ENDOSCOPY;  Service: Gastroenterology;  Laterality: N/A;  ? EYE SURGERY Bilateral   ? Cataract  Extraction with IOL  ? INTRAOPERATIVE CHOLANGIOGRAM N/A 06/12/2021  ? Procedure: INTRAOPERATIVE CHOLANGIOGRAM;  Surgeon: Herbert Pun, MD;  Location: [facility] ORS;  Service: General;  Laterality: N/A;  ? PORTA CATH INSERTION Right 11/20/2016  ? Procedure: PORTA CATH INSERTION;  Surgeon: [name], MD;  Location: [facility] ORS;  Service: General;  Laterality: Right;  ? PORTA CATH REMOVAL  2018  ? RE-EXCISION OF BREAST LUMPECTOMY Right 07/14/2019  ? Procedure: RE-EXCISION OF Right BREAST LUMPECTOMY;  Surgeon: Jules Husbands, MD;  Location: [facility] ORS;  Service: General;  Laterality: Right;  ? SHOULDER ARTHROSCOPY Right 2012  ? torn ligaments  ? ?Social History  ? ?Socioeconomic History  ? Marital status: Married  ?  Spouse name: Not on file  ? Number of children: Not on file  ? Years of education: Not on file  ? Highest education level: Not on file  ?Occupational History  ? Not on file  ?Tobacco Use  ? Smoking status: Never  ? Smokeless tobacco: Never  ?Vaping Use  ? Vaping Use: Never used  ?Substance and Sexual Activity  ? Alcohol use: No  ? Drug use: No  ? Sexual activity: Yes  ?  Birth control/protection: Post-menopausal  ?Other Topics Concern  ? Not on file  ?Social History Narrative  ? Not on file  ? ?Social Determinants of Health  ? ?Financial Resource Strain: Not on file  ?Food Insecurity: Not on file  ?Transportation Needs: Not on file  ?Physical Activity: Not on file  ?Stress: Not on file  ?Social Connections: Not on file  ?Intimate Partner Violence: Not on file  ? ?No Known Allergies ? ?Last set of Vital Signs (not current) ?Vitals:  ? 07-08-2021 0935 Jul 08, 2021 0940  ?BP: 112/63 (!) 122/59  ?Pulse: (!) 130   ?Resp: (!) 24 (!) 24  ?Temp:    ?SpO2: 92%   ? ?  ? ?Physical Exam ? ?Gen: unresponsive ?Cardiovascular: pulseless  ?Resp: apneic.  Ventilations being managed by anesthesiology ?Neuro: GCS 3, unresponsive ?Skin appears cool and cyanotic at the periphery ? ?CRITICAL CARE ?Performed by: Delman Kitten ?Total  critical care time: 15 ?Critical care time was exclusive of separately billable procedures and treating other patients. ?Critical care was necessary to treat or prevent imminent or life-threatening deterioration. ?Critical care was time spent personally by me on the following activities: development of treatment plan with patient and/or surrogate as well as nursing, discussions with consultants, evaluation of patient's response to treatment, examination of patient, obtaining history from patient or surrogate, ordering and performing treatments and interventions, ordering and review of laboratory studies, ordering and review of radiographic studies, pulse oximetry and re-evaluation of patient's condition. ? ?Cardiopulmonary Resuscitation (CPR) Procedure Note ? ?Directed/Performed by: Delman Kitten ?I personally directed ancillary staff and/or performed CPR in an effort to regain return of spontaneous circulation and to maintain cardiac, neuro and systemic perfusion.  Directed CPR and change of resuscitate her's ? ? ?Medical Decision making  ?Called to the OR for a CODE BLUE, patient currently under operative procedure.  Anesthesiologist  attendings present. ?During the code, was asked to provide additional advice.  Having reviewed the patient's most recent blood gas with anesthesiologist, I am concerned that severe acidosis may be driving this secondary to what is described as the patient having likely septic shock ? ?I did recommend use of additional bicarbonate, 2 additional amps of bicarb were given for pH less than 6.9.  Additionally, reviewed rhythm strips during CPR pauses and while I was there as patient appears to have a wide complex rhythm, with appearance of a underlying left bundle branch block.  It does appear to be organized and I do not observe ventricular fibrillation, ventricular tachycardia, or obvious shockable rhythm.  Appears to be PEA with a wide-complex left bundle branch block type appearance.   Appears to be not shockable ? ? ?Assessment and Plan  ?After providing evaluation and consultation with anesthesiologist, firming patency of airway and advising them on recommendation to give additional bica

## 2021-06-15 NOTE — H&P (Signed)
? ?NAME:  Wanda Reed, MRN:  [id number], DOB:  10-Mar-1956, LOS: 0 ?ADMISSION DATE:  Jun 21, 2021, CONSULTATION DATE:  21-Jun-2021 ?REFERRING MD:  Dr. Leonides Schanz, CHIEF COMPLAINT:  Abdominal pain, chest pain, SOB  ? ?Brief Pt Description / Synopsis:  ?66 year old female with recent laparoscopic cholecystectomy 2 days ago admitted with severe sepsis with Multifactorial shock in the setting of suspected intra-abdominal process/infection, along with AKI with severe metabolic acidosis. ? ?History of Present Illness:  ?Wanda Reed is a 66 y.o. female with a past medical history as listed below who presented to [facility] ED on June 21, 2021 due to complaints of abdominal pain, chest pressure, and shortness of breath. ? ?History is obtained from patient's daughter at bedside along with chart review.  According to her daughter, she is 2 days postop from a laparoscopic cholecystectomy for symptomatic cholelithiasis.  Following the procedure there were no noted complications, and she was discharged home the same day.  Since discharge she has had abdominal pain that has progressively gotten worse that was not well controlled with pain medications.  Early this morning between 3 and 4 AM, the pain acutely worsened, accompanied with chest tightness and shortness of breath prompting them to seek medical evaluation.  The pain is described as constant, diffuse, "crampy", and 10/10 in intensity. She also reports associated poor PO intake since discharge due to nausea, vomiting (non-bloody), and constipation.  She denied distention, fever, chills, or melena/hematochezia. ? ?ED Course: ?Initial Vital Signs: Temperature 97.6 ?F orally, respiratory rate 36, pulse 114, blood pressure 114/80, SPO2 91% on room air ?Significant Labs: Sodium 126, chloride 91, bicarbonate 10, glucose 103, BUN 36, creatinine 4.13, anion gap 25, lactic acid greater than 9, AST 76, ALT 27, total bilirubin 1.2, BNP 589.5, high-sensitivity troponin 42, WBC 6.9,  Hgb 15.3, Hct 48.7, PT 17.1, INR 1.4, APTT 39 ?COVID 19 & Influenza PCR negative ?EKG: ST elevation in V1 and V2.  ECG changed into left bundle branch block ?Imaging: ?CTA Chest/Abdomen/Pelvis>>IMPRESSION: ?1. No CT evidence for acute pulmonary embolus. ?2. Atelectasis in the dependent lung bases. ?3. Intraperitoneal free air in the abdomen and pelvis, not ?unexpected on postoperative day 2. ?4. Edema/fluid in the extraperitoneal tissues of the pelvic floor ?and in the perivesical space. Finding is indeterminate. Bladder is ?decompressed and there is gas in the bladder lumen presumably ?secondary to recent instrumentation. In the absence of recent ?instrumentation, gas in the bladder lumen and perivesical ?edema/fluid would raise concern for bladder infection. Bladder leak ?could also have this appearance although would be unusual in the ?absence of trauma. ?5. Nodular liver contour, compatible with cirrhosis. Small volume ?free fluid in the abdomen and pelvis with diffuse congestion/edema ?in the small bowel mesentery. ?6. Punctate nonobstructing stone lower pole right kidney. ?7. Aortic Atherosclerosis ?Medications Given: IV Cefepime, Flagyl, Vancomycin, 4L LR boluses, 324 mg ASA, IV fentanyl ? ?She met sepsis criteria therefore cultures were obtained, IV fluids given, along with broad spectrum IV antibiotics.  Code STEMI was called, and she was evaluated by Cardiology at bedside, and after evaluation Code STEMI was cancelled. ? ?Despite IVF resuscitation, she remained hypotensive requiring Levophed infusion.  PCCM asked to admit for further workup and treatment. ?Given her severe AKI with metabolic acidosis, 2 amps of Bicarb were given and Bicarb gtt ordered, along with Nephrology consultation.  Plan to place central line and arterial lines urgently given high vasopressor requirements, multiple infusions, and need for continuous BP monitoring. ? ?General Surgery was consulted urgently given concern  for  intraabdominal process/infection as the source of her Sepsis. ? ?Pertinent  Medical History  ?Hypertension ?Diabetes mellitus ?Hypothyroidism ?Breast cancer ? ?Micro Data:  ?3/31: SARS-CoV-2 and influenza PCR>> negative ?3/31: Blood culture x2>> ?3/31: Urine>> ? ?Antimicrobials:  ?Cefepime 3/31>> ?Flagyl 3/31>> ?Vancomycin x1 dose 3/31 ? ?Significant Hospital Events: ?Including procedures, antibiotic start and stop dates in addition to other pertinent events   ?3/31: Presents to ED.  PCCM asked to admit due to severe sepsis with multifactorial shock.  General surgery and nephrology consulted. ? ?Interim History / Subjective:  ?-Pt evaluated in ED ~ CRITICALLY ILL APPEARING ?-Afebrile, hypotensive despite 4L of IVF boluses, requiring high dose Levophed gtt ?-Will need urgent CVL and a-line placed ?-Giving 2 amps Bicarb and starting Bicarb gtt for severe metabolic acidosis and AKI ?-Nephrology consulted ?-General Surgery consulted urgently given concern for intraabdominal process/infection as the source of her Sepsis ?-CT Abdomen shows pneumoperitoneum, but unclear if more than should be expected following recent procedure ? ?Objective   ?Blood pressure (!) 116/34, pulse (!) 110, temperature 97.6 ?F (36.4 ?C), temperature source Oral, resp. rate (!) 32, height '5\' 1"'  (1.549 m), weight 60 kg, SpO2 95 %. ?   ?   ? ?Intake/Output Summary (Last 24 hours) at June 26, 2021 0724 ?Last data filed at June 26, 2021 6195 ?Gross per 24 hour  ?Intake 2000 ml  ?Output --  ?Net 2000 ml  ? ?Filed Weights  ? 2021/06/26 0515  ?Weight: 60 kg  ? ? ?Examination: ?General: Critically ill-appearing female, laying in bed, on 2 L nasal cannula, with moderate respiratory distress and acute abdominal pain ?HENT: Atraumatic, normocephalic, neck supple, no JVD ?Lungs: Diminished breath sounds bilaterally, no rales or wheezing noted, tachypneic with accessory muscle use, even ?Cardiovascular: Tachycardia, regular rhythm, S1-S2, no murmurs, rubs,  gallops ?Abdomen: Slightly taut and distended, tender to palpation with some guarding, no rebound tenderness, bowel sounds hypoactive, multiple laparoscopic incisions well approximated with no overt signs of infection ?Extremities: Normal bulk and tone, no deformities, no edema ?Neuro: Awake and alert, oriented, follows commands, no focal deficits ?GU: Deferred ? ?Resolved Hospital Problem list   ? ? ?Assessment & Plan:  ? ?Multifactorial Shock: Hypovolemic + Septic +/- Cardiogenic ?Chest pain, initially felt to meet STEMI criteria, ruled out by cardiology ?Elevated Troponin, suspect demand ischemia vs NSTEMI ?PMHx: Hypertension ?-Continuous cardiac monitoring ?-Maintain MAP >65 ?-IV fluids (received 4L IVF resuscitation in ED) ?-Vasopressors as needed to maintain MAP goal ?-Obtain CVP to further guide resuscitation once CVL established ?-Stress dose steroids ?-Trend lactic acid until normalized (>9.0 ~>9.0 ~ ) ?-Trend HS Troponin until peaked (42 ~ 121 ~) ?-Echocardiogram pending ?-Cardiology consulted, appreciate input ~ holding off on anticoagulation given recent surgery ? ?Severe sepsis, suspect intra-abdominal source as patient is 2 days postop laparoscopic cholecystectomy for symptomatic cholelithiasis ?Meets SIRS criteria upon admission (RR 36, HR 114, Lactic >9) ?-Monitor fever curve ?-Trend WBC's & Procalcitonin ?-Follow cultures as above ?-Continue empiric Cefepime & Flagyl pending cultures & sensitivities ?-General Surgery consulted urgently, appreciate input ? ?Acute Hypoxic & Hypercapnic Respiratory Failure in the setting of multiple metabolic derangements & severe sepsis ?-Full vent support, implement lung protective strategies ?-Plateau pressures less than 30 cm H20 ?-Wean FiO2 & PEEP as tolerated to maintain O2 sats >92% ?-Follow intermittent Chest X-ray & ABG as needed ?-Spontaneous Breathing Trials when respiratory parameters met and mental status permits ?-Implement VAP Bundle ?-Prn  Bronchodilators ? ?Acute Kidney Injury ?Severe Anion Gap Metabolic Acidosis in setting of AKI & Lactic  Acidosis ?Hyponatremia, suspect in setting of dehydration due to poor PO intake ?-Monitor I&O's / urinary output ?-Follow BMP ?-[place]

## 2021-06-15 NOTE — ED Notes (Addendum)
This RN to bedside, first contact with pt. Pt. Is requesting more pain medication. This RN contacted Dr. Vaughan Browner to inquire about whether vasopressin and 4th bolus of LR ordered is desired, as pt. Is maintaining pressure currently on levophed.  ?

## 2021-06-15 NOTE — Death Summary Note (Signed)
?DEATH SUMMARY  ? ?Patient Details  ?Name: Wanda Reed ?MRN: [id number] ?DOB: 1955/04/10 ? ?Admission/Discharge Information  ? ?Admit Date:  07-05-2021  ?Date of Death:  07-05-21  ?Time of Death:  14:05  ?Length of Stay: 0  ?Referring Physician: [facility]  ? ?Reason(s) for Hospitalization  ?Abdominal Pain ?Severe Sepsis ?Multifactorial Shock: Septic + Hypovolemic +/- Cardiogenic ?Chest pain ?Elevated Troponin ?Acute Kidney Injury ?Anion Gap Metabolic Acidosis ?Lactic Acidosis ?Hyponatremia ?Acute Hypoxic & Hypercapnic Respiratory Failure ? ?Diagnoses  ?Preliminary cause of death:  ?Septic Shock with severe Metabolic acidosis ?Secondary Diagnoses (including complications and co-morbidities):  ?Principal Problem: ?  Severe sepsis with lactic acidosis ([employer]) ?Multifactorial Shock: Septic + Hypovolemic +/- Cardiogenic ?Chest pain ?Elevated Troponin ?Acute Kidney Injury ?Anion Gap Metabolic Acidosis ?Lactic Acidosis ?Hyponatremia ?Acute Hypoxic & Hypercapnic Respiratory Failure ? ?Brief Hospital Course (including significant findings, care, treatment, and services provided and events leading to death)  ?Wanda Reed is a 66 y.o. female with a past medical history as listed below who presented to [facility] ED on 05-Jul-2021 due to complaints of abdominal pain, chest pressure, and shortness of breath. ?  ?History is obtained from patient's daughter at bedside along with chart review.  According to her daughter, she is 2 days postop from a laparoscopic cholecystectomy for symptomatic cholelithiasis.  Following the procedure there were no noted complications, and she was discharged home the same day.  Since discharge she has had abdominal pain that has progressively gotten worse that was not well controlled with pain medications.  Early this morning between 3 and 4 AM, the pain acutely worsened, accompanied with chest tightness and shortness of breath prompting them to seek medical evaluation.  The  pain is described as constant, diffuse, "crampy", and 10/10 in intensity. She also reports associated poor PO intake since discharge due to nausea, vomiting (non-bloody), and constipation.  She denied distention, fever, chills, or melena/hematochezia. ?  ?ED Course: ?Initial Vital Signs: Temperature 97.6 ?F orally, respiratory rate 36, pulse 114, blood pressure 114/80, SPO2 91% on room air ?Significant Labs: Sodium 126, chloride 91, bicarbonate 10, glucose 103, BUN 36, creatinine 4.13, anion gap 25, lactic acid greater than 9, AST 76, ALT 27, total bilirubin 1.2, BNP 589.5, high-sensitivity troponin 42, WBC 6.9, Hgb 15.3, Hct 48.7, PT 17.1, INR 1.4, APTT 39 ?COVID 19 & Influenza PCR negative ?EKG: ST elevation in V1 and V2.  ECG changed into left bundle branch block ?Imaging: ?CTA Chest/Abdomen/Pelvis>>IMPRESSION: ?1. No CT evidence for acute pulmonary embolus. ?2. Atelectasis in the dependent lung bases. ?3. Intraperitoneal free air in the abdomen and pelvis, not ?unexpected on postoperative day 2. ?4. Edema/fluid in the extraperitoneal tissues of the pelvic floor ?and in the perivesical space. Finding is indeterminate. Bladder is ?decompressed and there is gas in the bladder lumen presumably ?secondary to recent instrumentation. In the absence of recent ?instrumentation, gas in the bladder lumen and perivesical ?edema/fluid would raise concern for bladder infection. Bladder leak ?could also have this appearance although would be unusual in the ?absence of trauma. ?5. Nodular liver contour, compatible with cirrhosis. Small volume ?free fluid in the abdomen and pelvis with diffuse congestion/edema ?in the small bowel mesentery. ?6. Punctate nonobstructing stone lower pole right kidney. ?7. Aortic Atherosclerosis ?Medications Given: IV Cefepime, Flagyl, Vancomycin, 4L LR boluses, 324 mg ASA, IV fentanyl ?  ?She met sepsis criteria therefore cultures were obtained, IV fluids given, along with broad spectrum IV  antibiotics.  Code STEMI was called, and  she was evaluated by Cardiology at bedside, and after evaluation Code STEMI was cancelled. ?  ?Despite IVF resuscitation, she remained hypotensive requiring Levophed infusion.  PCCM asked to admit for further workup and treatment. ?Given her severe AKI with metabolic acidosis, 2 amps of Bicarb were given and Bicarb gtt ordered, along with Nephrology consultation.  Plan to place central line and arterial lines urgently given high vasopressor requirements, multiple infusions, and need for continuous BP monitoring. ?  ?General Surgery was consulted urgently given concern for intraabdominal process/infection as the source of her Sepsis. ? ?After arrival to ICU, she acutely decompensated, becoming severely hypoxic with concern for aspiration of gastric contents requiring emergent intubation. Central line and arterial lines placed. ? ?As soon as CVL and arterial lines obtained, she was taken urgently to OR for exploration.  ABG resulted while pt in OR showing significant metabolic acidosis.  While in the OR (prior to opening incision), pt suffered Cardiac arrest.  Per Anesthesia team, prolonged resuscitation of which ACLS/CPR was provided  off/on for approximately 1.5. hours. Very weak thready pulse obtained.  OR team and Surgery discussed Code with pt's family, and decision made that if pt cardiac arrested again would allow her to pass peacefully with no further resuscitative efforts. ? ?Upon return to ICU, pt was found to be pulseless with asystole on telemetry, no BP.  Pronounced at that time with family at bedside. ? ? ? ?Pertinent Labs and Studies  ?Significant Diagnostic Studies ?DG Chest 1 View ? ?Result Date: 06/26/21 ?CLINICAL DATA:  Encounter for central line placement EXAM: CHEST  1 VIEW COMPARISON:  Earlier today at 9:15 a.m. FINDINGS: 11 a.m.  Left internal jugular line tip at low SVC. Endotracheal tube terminates 2.7 cm above carina. Nasogastric tube extends beyond  the inferior aspect of the film. Normal heart size for level of inspiration. Pacer/defibrillator projects over the left side of the chest. Possible small left pleural effusion. No pneumothorax. Low lung volumes, accentuating pulmonary interstitial prominence. Bibasilar airspace disease. IMPRESSION: Left internal jugular line tip appropriately positioned, without pneumothorax. Repositioning of endotracheal tube, now 2.7 cm above carina. Diminished lung volumes with persistent bibasilar atelectasis and suspicion of developing pulmonary venous congestion. Electronically Signed   By: Abigail Miyamoto M.D.   On: Jun 26, 2021 11:28  ? ?DG Abd 1 View ? ?Result Date: 06-26-2021 ?CLINICAL DATA:  Orogastric tube placement EXAM: ABDOMEN - 1 VIEW COMPARISON:  Portable exam 0916 hours without priors for comparison FINDINGS: Gaseous distention of stomach. Orogastric tube tip projects over gastric antrum. Nonobstructive bowel gas pattern. No acute osseous findings. Free air consistent with recent surgery. IMPRESSION: Tip of orogastric tube projects over gastric antrum. Electronically Signed   By: Lavonia Dana M.D.   On: 26-Jun-2021 09:33  ? ?DG Cholangiogram Operative ? ?Result Date: 06/12/2021 ?CLINICAL DATA:  66 year old female with cholelithiasis EXAM: INTRAOPERATIVE CHOLANGIOGRAM TECHNIQUE: Cholangiographic images from the C-arm fluoroscopic device were submitted for interpretation post-operatively. Please see the procedural report for the amount of contrast and the fluoroscopy time utilized. FLUOROSCOPY: Radiation Exposure Index (as provided by the fluoroscopic device): Op note COMPARISON:  None. FINDINGS: Surgical instruments project over the upper abdomen. There is cannulation of the cystic duct/gallbladder neck, with antegrade infusion of contrast. Caliber of the extrahepatic ductal system within normal limits. No definite filling defect within the extrahepatic ducts identified. Free flow of contrast across the ampulla. IMPRESSION:  Intraoperative cholangiogram demonstrates extrahepatic biliary ducts of unremarkable caliber, with no definite filling defects identified. Free flow of contrast  across the ampulla. Please refer to the dictate

## 2021-06-15 NOTE — Procedures (Signed)
Central Venous Catheter Insertion Procedure Note ?Wanda Reed ?[id number] ?Sep 19, 1955 ? ?Procedure: Insertion of Central Venous Catheter ?Indications: Assessment of intravascular volume, Drug and/or fluid administration, and Frequent blood sampling ? ?Procedure Details ?Consent: Risks of procedure as well as the alternatives and risks of each were explained to the (patient/caregiver).  Consent for procedure obtained. ?Time Out: Verified patient identification, verified procedure, site/side was marked, verified correct patient position, special equipment/implants available, medications/allergies/relevent history reviewed, required imaging and test results available.  Performed ? ?Maximum sterile technique was used including antiseptics, cap, gloves, gown, hand hygiene, mask, and sheet. ?Skin prep: Chlorhexidine; local anesthetic administered ?A antimicrobial bonded/coated triple lumen catheter was placed in the left internal jugular vein using the Seldinger technique. ? ?Evaluation ?Blood flow good ?Complications: No apparent complications ?Patient did tolerate procedure well. ?Chest X-ray ordered to verify placement.  CXR: pending. ? ? ?Line was secured at the 18 cm mark. ?BIOPATCH applied to the insertion site. ? ? ? ?Darel Hong, AGACNP-BC ?[facility] ?Prefer epic messenger for cross cover needs ?If after hours, please call E-link ? ? ?Bradly Bienenstock ?2021/06/26, 10:43 AM ? ? ?

## 2021-06-15 NOTE — Progress Notes (Signed)
PHARMACY CONSULT NOTE  ? ?Pharmacy Consult for Electrolyte Monitoring and Replacement  ? ?Recent Labs: ?Potassium (mmol/L)  ?Date Value  ?June 29, 2021 3.6  ? ?Magnesium (mg/dL)  ?Date Value  ?11/28/2016 1.9  ? ?Calcium (mg/dL)  ?Date Value  ?06-29-21 9.2  ? ?Albumin (g/dL)  ?Date Value  ?29-Jun-2021 2.9 (L)  ?07/11/2020 4.5  ? ?Phosphorus (mg/dL)  ?Date Value  ?11/28/2016 2.9  ? ?Sodium (mmol/L)  ?Date Value  ?June 29, 2021 126 (L)  ?07/11/2020 141  ? ? ?Assessment: ?28 YOF presenting with chest pressure, shortness of breath, and abdominal pain. Initial concern for STEMI, which was cancelled, then LBBB. PMH HTN, T2DM, hypothyroid, previous breast cancer s/p lumpectomy.  ? ?Levophed '@35'$  mcg/hr ?Vasopressin 0 mcg/hr ?LR @ 150 mL/hr ?Nabicarb @ 100 mL/hr ? ?Scr 4.13 ? ?Goal of Therapy:  ?Electrolytes WNL ? ?Plan:  ?Hyponatremic on LR @ 150 and sodium bicarbonate @ 100 mL/hr ?Follow up electrolytes in the AM ? ? ?Wynelle Cleveland, PharmD ?Pharmacy Resident  ?06/29/21 ?8:56 AM ? ?

## 2021-06-15 NOTE — ED Notes (Signed)
In and out attempted. No output at this time. Pure wick placed. MD Ward made aware of no urine output at this time.  ?

## 2021-06-15 NOTE — Sepsis Progress Note (Signed)
Elink following Code Sepsis. 

## 2021-06-15 NOTE — Progress Notes (Signed)
Patient passing within minutes of arrival to ICU, family called to bedside. Pronounced by Darel Hong NP. ?

## 2021-06-15 NOTE — Progress Notes (Signed)
CODE SEPSIS - PHARMACY COMMUNICATION ? ?**Broad Spectrum Antibiotics should be administered within 1 hour of Sepsis diagnosis** ? ?Time Code Sepsis Called/Page Received: 3/31 @ 7846 ? ?Antibiotics Ordered: Vanc, Cefepime  ? ?Time of 1st antibiotic administration: Cefepime 2 gm IV x 1 given on 3/31 @ 0536 ? ?Additional action taken by pharmacy:  ? ?If necessary, Name of Provider/Nurse Contacted:  ? ? ? ?Wanda Reed D ,PharmD ?Clinical Pharmacist  ?June 22, 2021  5:41 AM ? ?

## 2021-06-15 NOTE — ED Provider Notes (Signed)
? ?[facility] ?Provider Note ? ? ? Event Date/Time  ? First MD Initiated Contact with Patient 06-21-21 [phone]   ?  (approximate) ? ? ?History  ? ?Post-op Problem ? ? ?HPI ? ?Wanda Reed is a 66 y.o. female with history of hypertension, diabetes, hypothyroidism, previous breast cancer status post lumpectomy who presents to the emergency department with her daughter with concerns for increasing abdominal pain and chest pressure and shortness of breath.  Patient is postop day 2 from a laparoscopic cholecystectomy from symptomatic cholelithiasis.  Surgery was done by Dr. Peyton Najjar.  Daughter states since discharge she has had abdominal pain that has not been well controlled with oral medications at home but suddenly last night between 3 and 4 AM pain increased.  They have given her tramadol and ibuprofen without relief.  She denies any fevers.  She has had nausea and vomiting.  No diarrhea, bloody stools or melena.  States she has been constipated since surgery.  No abdominal distention.  She has had previous C-sections, bilateral oophorectomy, salpingectomy and hysterectomy.  She denies any cardiac history. ? ? ?History provided by patient and daughter. ? ? ? ?Past Medical History:  ?Diagnosis Date  ? Anxiety   ? BRCA2 positive   ? pt and 2 daughters are positive  ? Breast cancer ([place]) 07/2016  ? right breast lumpectomy with chemo and mammosite  ? Breast cancer of upper-outer quadrant of right female breast ([place]) 08/2016  ? 1.0 cm ER+, PR-, Her 2 neu not overexpressed. Node negative. T1b, N0.  ? Cervical cancer ([place]) 1990  ? Hypertension   ? Hypothyroidism   ? Personal history of chemotherapy 11/2016-12/2016  ? right breast ca  ? Personal history of radiation therapy 09/15/2016  ? mammosite  ? PONV (postoperative nausea and vomiting)   ? Pre-diabetes   ? Thyroid disease   ? Urinary tract infection   ? ? ?Past Surgical History:  ?Procedure Laterality Date  ? ABDOMINAL HYSTERECTOMY    ?  BREAST BIOPSY Right 08/08/2016  ? invasive mammary carcinoma  ? BREAST BIOPSY Right 06/01/2019  ? bx neg and abbcess drainage  ? BREAST LUMPECTOMY Right 08/28/2016  ? invasive mammary carcinoma, clear margins, negative lymph nodes  ? BREAST LUMPECTOMY WITH SENTINEL LYMPH NODE BIOPSY Right 08/28/2016  ? Procedure: BREAST LUMPECTOMY WITH SENTINEL LYMPH NODE BX;  Surgeon: [name], MD;  Location: [facility] ORS;  Service: General;  Laterality: Right;  ? BREAST MAMMOSITE Right 09/15/2016  ? Procedure: MAMMOSITE BREAST;  Surgeon: [name], MD;  Location: [facility] ORS;  Service: General;  Laterality: Right;  ? BREAST SURGERY Right   ? Breast Biopsy  ? CERVIX SURGERY    ? COLONOSCOPY  2008  ? COLONOSCOPY WITH PROPOFOL N/A 04/26/2020  ? Procedure: COLONOSCOPY WITH PROPOFOL;  Surgeon: Jonathon Bellows, MD;  Location: [facility] ENDOSCOPY;  Service: Gastroenterology;  Laterality: N/A;  ? ESOPHAGOGASTRODUODENOSCOPY (EGD) WITH PROPOFOL N/A 04/26/2020  ? Procedure: ESOPHAGOGASTRODUODENOSCOPY (EGD) WITH PROPOFOL;  Surgeon: Jonathon Bellows, MD;  Location: [facility] ENDOSCOPY;  Service: Gastroenterology;  Laterality: N/A;  ? EYE SURGERY Bilateral   ? Cataract Extraction with IOL  ? INTRAOPERATIVE CHOLANGIOGRAM N/A 06/12/2021  ? Procedure: INTRAOPERATIVE CHOLANGIOGRAM;  Surgeon: Herbert Pun, MD;  Location: [facility] ORS;  Service: General;  Laterality: N/A;  ? PORTA CATH INSERTION Right 11/20/2016  ? Procedure: PORTA CATH INSERTION;  Surgeon: [name], MD;  Location: [facility] ORS;  Service: General;  Laterality: Right;  ? PORTA CATH REMOVAL  2018  ? RE-EXCISION OF BREAST LUMPECTOMY Right 07/14/2019  ? Procedure: RE-EXCISION OF Right BREAST LUMPECTOMY;  Surgeon: Jules Husbands, MD;  Location: [facility] ORS;  Service: General;  Laterality: Right;  ? SHOULDER ARTHROSCOPY Right 2012  ? torn ligaments  ? ? ?MEDICATIONS:  ?Prior to Admission medications   ?Medication Sig Start Date End Date Taking? Authorizing Provider  ?ACCU-CHEK GUIDE test strip 1  each daily. 12/27/20   [provider]  ?acyclovir (ZOVIRAX) 400 MG tablet Take 400 mg by mouth 3 (three) times daily. 05/13/21   [provider]  ?amLODipine (NORVASC) 5 MG tablet Take 5 mg by mouth daily.    [provider]  ?AZO-CRANBERRY PO Take by mouth.    [provider]  ?Blood Glucose Monitoring Suppl (ACCU-CHEK GUIDE) w/Device KIT use as directed for diabetes, dx E11.9 12/27/20   [provider]  ?Blood Pressure Monitoring (BLOOD PRESSURE KIT) KIT Apply topically as directed. 12/20/20   [provider]  ?CALCIUM PO Take 1 tablet by mouth daily.    [provider]  ?gabapentin (NEURONTIN) 100 MG capsule Take 200 mg by mouth 2 (two) times daily. Takes 2 capsules in the morning and two capsules HS    [provider]  ?[employer] Lancets UltraThin 30G MISC Use   as directed   to check blood sugar dx E11.9 12/27/20   [provider]  ?letrozole (FEMARA) 2.5 MG tablet Take 1 tablet (2.5 mg total) by mouth daily. 05/27/21   Lloyd Huger, MD  ?levothyroxine (SYNTHROID, LEVOTHROID) 100 MCG tablet Take 100 mcg by mouth daily before breakfast.    [provider]  ?lisinopril (PRINIVIL,ZESTRIL) 10 MG tablet Take 10 mg by mouth daily.    [provider]  ?Melatonin 12 MG TABS Take by mouth.    [provider]  ?metFORMIN (GLUCOPHAGE) 500 MG tablet Take 500 mg by mouth 2 (two) times daily. 06/05/20   [provider]  ?senna-docusate (SENOKOT-S) 8.6-50 MG tablet Take 1 tablet by mouth daily. ?Patient not taking: Reported on 06/03/2021 06/13/20   Billey Co, MD  ?traMADol (ULTRAM) 50 MG tablet Take 1 tablet (50 mg total) by mouth every 6 (six) hours as needed. 06/12/21 06/12/22  Herbert Pun, MD  ?mirabegron ER (MYRBETRIQ) 25 MG TB24 tablet Take 1 tablet (25 mg total) by mouth daily. ?Patient not taking: No sig reported 09/01/19 05/04/20  Zara Council A, PA-C  ? ? ?Physical Exam  ? ?Triage Vital  Signs: ?ED Triage Vitals  ?Enc Vitals Group  ?   BP 2021-06-28 0516 (!) 95/39  ?   Pulse Rate June 28, 2021 0516 (!) 130  ?   Resp 2021/06/28 0516 (!) 30  ?   Temp 28-Jun-2021 0516 97.6 ?F (36.4 ?C)  ?   Temp Source 06-28-21 0516 Oral  ?   SpO2 06/28/21 0516 95 %  ?   Weight 06-28-2021 0515 132 lb 4.4 oz (60 kg)  ?   Height 28-Jun-2021 0515 _0  (1.549 m)  ?   Head Circumference --   ?   Peak Flow --   ?   Pain Score --   ?   Pain Loc --   ?   Pain Edu? --   ?   Excl. in [place]? --   ? ? ?Most recent vital signs: ?Vitals:  ? 06-28-21 0720 Jun 28, 2021 0725  ?BP: (!) 117/40 (!) 113/43  ?Pulse: (!) 115 (!) 116  ?Resp: (!) 42 (!) 42  ?Temp:    ?  SpO2: 94% 93%  ? ? ?CONSTITUTIONAL: Alert and oriented and responds appropriately to questions.  Speaking only.  Appears extremely uncomfortable.  Pale with gray appearing lips. ?HEAD: Normocephalic, atraumatic ?EYES: Conjunctivae clear, pupils appear equal, sclera nonicteric, no conjunctival pallor ?ENT: normal nose; dry mucous membranes ?NECK: Supple, normal ROM ?CARD: RRR; S1 and S2 appreciated; no murmurs, no clicks, no rubs, no gallops ?RESP: Patient is tachypneic.  Lungs clear to auscultation.  No rhonchi, wheezing or rales.  Initial sats in the 80s on room air. ?ABD/GI: Patient's abdomen is diffusely tender to palpation but there is no guarding or rebound.  Her incision sites are clean, dry and intact without surrounding redness, warmth, bleeding or drainage. ?BACK: The back appears normal ?EXT: Normal ROM in all joints; no deformity noted, no edema; no cyanosis, no calf tenderness or calf swelling, extremities are warm and well-perfused ?SKIN: Normal color for age and race; warm; no rash on exposed skin ?NEURO: Moves all extremities equally, normal speech, normal speech ?PSYCH: The patient's mood and manner are appropriate. ? ? ?ED Results / Procedures / Treatments  ? ?LABS: ?(all labs ordered are listed, but only abnormal results are displayed) ?Labs Reviewed  ?CBC WITH DIFFERENTIAL/PLATELET -  Abnormal; Notable for the following components:  ?    Result Value  ? RBC 5.21 (*)   ? Hemoglobin 15.3 (*)   ? HCT 48.7 (*)   ? All other components within normal limits  ?COMPREHENSIVE METABOLIC PANEL

## 2021-06-15 NOTE — Progress Notes (Signed)
Chaplain Maggie responded to code to OR and then accompanied physician to ICU where pt's daughters were waiting. Chaplain remained with family to offer non-anxious presence. Silent prayer and hospitality was offered. Family is currently in the ICU waiting room awaiting outcome.  ?

## 2021-06-15 DEATH — deceased

## 2021-06-17 ENCOUNTER — Other Ambulatory Visit: Payer: Medicare Other

## 2021-06-17 LAB — CULTURE, BLOOD (ROUTINE X 2): Special Requests: ADEQUATE

## 2021-06-18 NOTE — Anesthesia Postprocedure Evaluation (Signed)
Anesthesia Post Note ? ?Patient: Wanda Reed ? ?Procedure(s) Performed: XI ROBOTIC ASSISTED LAPAROSCOPIC CHOLECYSTECTOMY -- w / chlangiogram (Abdomen) ?INDOCYANINE GREEN FLUORESCENCE IMAGING (ICG) ?INTRAOPERATIVE CHOLANGIOGRAM ? ?Patient location during evaluation: PACU ?Anesthesia Type: General ?Level of consciousness: awake and alert ?Pain management: pain level controlled ?Vital Signs Assessment: post-procedure vital signs reviewed and stable ?Respiratory status: spontaneous breathing, nonlabored ventilation, respiratory function stable and patient connected to nasal cannula oxygen ?Cardiovascular status: blood pressure returned to baseline and stable ?Postop Assessment: no apparent nausea or vomiting ?Anesthetic complications: no ?Comments: Note information based on post op recovery status following above surgery.  This note is dated for April 4 to close chart for the above procedure. Dr. Vashti Hey ? ? ?No notable events documented. ? ? ?Last Vitals:  ?Vitals:  ? 06/12/21 1131 06/12/21 1221  ?BP: (!) 105/48 (!) 106/52  ?Pulse: 71 60  ?Resp: 18 17  ?Temp: (!) 36.1 ?C   ?SpO2: 98%   ?  ?Last Pain:  ?Vitals:  ? 06/13/21 1115  ?TempSrc:   ?PainSc: 5   ? ? ?  ?  ?  ?  ?  ?  ? ?Wanda Reed ? ? ? ? ?

## 2021-06-19 LAB — CULTURE, BLOOD (ROUTINE X 2): Culture: NO GROWTH

## 2021-06-19 LAB — BPAM RBC
Blood Product Expiration Date: 202304192359
Blood Product Expiration Date: 202304192359
ISSUE DATE / TIME: 202303311429
ISSUE DATE / TIME: 202303311429
Unit Type and Rh: 5100
Unit Type and Rh: 5100

## 2021-06-19 LAB — ABO/RH: ABO/RH(D): O POS

## 2021-06-19 LAB — TYPE AND SCREEN
ABO/RH(D): O POS
Antibody Screen: NEGATIVE
Unit division: 0
Unit division: 0

## 2021-06-20 LAB — PREPARE PLATELET PHERESIS: Unit division: 0

## 2021-06-20 LAB — BPAM PLATELET PHERESIS
Blood Product Expiration Date: 202304022359
ISSUE DATE / TIME: 202303311303
Unit Type and Rh: 6200

## 2021-06-24 ENCOUNTER — Ambulatory Visit: Payer: Medicare Other | Admitting: Nurse Practitioner

## 2021-07-01 LAB — BLOOD GAS, ARTERIAL
Acid-Base Excess: 2.4 mmol/L — ABNORMAL HIGH (ref 0.0–2.0)
Bicarbonate: 31.1 mmol/L — ABNORMAL HIGH (ref 20.0–28.0)
FIO2: 100 %
O2 Saturation: 99.4 %
RATE: 16 resp/min
pCO2 arterial: 71 mmHg (ref 32–48)
pH, Arterial: 7.25 — ABNORMAL LOW (ref 7.35–7.45)
pO2, Arterial: 189 mmHg — ABNORMAL HIGH (ref 83–108)

## 2021-09-09 ENCOUNTER — Ambulatory Visit: Payer: Medicare Other | Admitting: Gastroenterology
